# Patient Record
Sex: Female | Born: 1950 | Race: White | Hispanic: No | Marital: Married | Smoking: Never smoker
Health system: Southern US, Community
[De-identification: ages and names within clinical notes are randomized; demographics above are authoritative.]

## PROBLEM LIST (undated history)

## (undated) DIAGNOSIS — R296 Repeated falls: Secondary | ICD-10-CM

## (undated) DIAGNOSIS — G20A1 Parkinson's disease without dyskinesia, without mention of fluctuations: Secondary | ICD-10-CM

## (undated) DIAGNOSIS — E039 Hypothyroidism, unspecified: Secondary | ICD-10-CM

## (undated) DIAGNOSIS — M810 Age-related osteoporosis without current pathological fracture: Secondary | ICD-10-CM

## (undated) DIAGNOSIS — G20 Parkinson's disease: Secondary | ICD-10-CM

## (undated) HISTORY — PX: WRIST SURGERY: SHX841

## (undated) HISTORY — DX: Age-related osteoporosis without current pathological fracture: M81.0

## (undated) HISTORY — DX: Hypothyroidism, unspecified: E03.9

## (undated) HISTORY — DX: Repeated falls: R29.6

## (undated) HISTORY — DX: Parkinson's disease without dyskinesia, without mention of fluctuations: G20.A1

## (undated) HISTORY — PX: TONSILLECTOMY: SUR1361

## (undated) HISTORY — DX: Parkinson's disease: G20

---

## 1998-03-06 ENCOUNTER — Other Ambulatory Visit: Admission: RE | Admit: 1998-03-06 | Discharge: 1998-03-06 | Payer: Self-pay | Admitting: Obstetrics and Gynecology

## 1999-03-10 ENCOUNTER — Other Ambulatory Visit: Admission: RE | Admit: 1999-03-10 | Discharge: 1999-03-10 | Payer: Self-pay | Admitting: Obstetrics and Gynecology

## 1999-03-23 ENCOUNTER — Encounter (INDEPENDENT_AMBULATORY_CARE_PROVIDER_SITE_OTHER): Payer: Self-pay | Admitting: Specialist

## 1999-03-23 ENCOUNTER — Other Ambulatory Visit: Admission: RE | Admit: 1999-03-23 | Discharge: 1999-03-23 | Payer: Self-pay | Admitting: Obstetrics and Gynecology

## 2000-05-04 ENCOUNTER — Other Ambulatory Visit: Admission: RE | Admit: 2000-05-04 | Discharge: 2000-05-04 | Payer: Self-pay | Admitting: Obstetrics and Gynecology

## 2001-08-08 ENCOUNTER — Other Ambulatory Visit: Admission: RE | Admit: 2001-08-08 | Discharge: 2001-08-08 | Payer: Self-pay | Admitting: Obstetrics and Gynecology

## 2002-09-18 ENCOUNTER — Other Ambulatory Visit: Admission: RE | Admit: 2002-09-18 | Discharge: 2002-09-18 | Payer: Self-pay | Admitting: Obstetrics and Gynecology

## 2003-09-30 ENCOUNTER — Other Ambulatory Visit: Admission: RE | Admit: 2003-09-30 | Discharge: 2003-09-30 | Payer: Self-pay | Admitting: Obstetrics and Gynecology

## 2004-11-11 ENCOUNTER — Other Ambulatory Visit: Admission: RE | Admit: 2004-11-11 | Discharge: 2004-11-11 | Payer: Self-pay | Admitting: Obstetrics and Gynecology

## 2004-11-16 ENCOUNTER — Ambulatory Visit: Admission: RE | Admit: 2004-11-16 | Discharge: 2004-11-16 | Payer: Self-pay | Admitting: Obstetrics and Gynecology

## 2004-12-20 ENCOUNTER — Ambulatory Visit: Payer: Self-pay | Admitting: Cardiology

## 2004-12-24 ENCOUNTER — Ambulatory Visit: Payer: Self-pay | Admitting: Cardiology

## 2005-01-03 ENCOUNTER — Ambulatory Visit: Payer: Self-pay

## 2005-06-24 ENCOUNTER — Encounter: Admission: RE | Admit: 2005-06-24 | Discharge: 2005-06-24 | Payer: Self-pay | Admitting: Gastroenterology

## 2005-06-29 ENCOUNTER — Ambulatory Visit (HOSPITAL_COMMUNITY): Admission: RE | Admit: 2005-06-29 | Discharge: 2005-06-29 | Payer: Self-pay | Admitting: Gastroenterology

## 2009-07-14 ENCOUNTER — Encounter: Admission: RE | Admit: 2009-07-14 | Discharge: 2009-07-14 | Payer: Self-pay | Admitting: Internal Medicine

## 2011-10-20 DIAGNOSIS — G20 Parkinson's disease: Secondary | ICD-10-CM | POA: Insufficient documentation

## 2011-10-20 DIAGNOSIS — G20A1 Parkinson's disease without dyskinesia, without mention of fluctuations: Secondary | ICD-10-CM | POA: Insufficient documentation

## 2013-01-30 ENCOUNTER — Ambulatory Visit: Payer: BC Managed Care – PPO | Attending: Psychiatry | Admitting: Physical Therapy

## 2013-11-20 ENCOUNTER — Ambulatory Visit: Payer: Medicare Other | Attending: Psychiatry | Admitting: Physical Therapy

## 2013-11-20 DIAGNOSIS — G20A1 Parkinson's disease without dyskinesia, without mention of fluctuations: Secondary | ICD-10-CM | POA: Insufficient documentation

## 2013-11-20 DIAGNOSIS — R279 Unspecified lack of coordination: Secondary | ICD-10-CM | POA: Insufficient documentation

## 2013-11-20 DIAGNOSIS — IMO0001 Reserved for inherently not codable concepts without codable children: Secondary | ICD-10-CM | POA: Insufficient documentation

## 2013-11-20 DIAGNOSIS — G20 Parkinson's disease: Secondary | ICD-10-CM | POA: Insufficient documentation

## 2013-11-20 DIAGNOSIS — R269 Unspecified abnormalities of gait and mobility: Secondary | ICD-10-CM | POA: Insufficient documentation

## 2013-11-20 DIAGNOSIS — R259 Unspecified abnormal involuntary movements: Secondary | ICD-10-CM | POA: Diagnosis not present

## 2013-12-03 ENCOUNTER — Ambulatory Visit: Payer: Medicare Other | Attending: Psychiatry | Admitting: Physical Therapy

## 2013-12-03 DIAGNOSIS — R259 Unspecified abnormal involuntary movements: Secondary | ICD-10-CM | POA: Insufficient documentation

## 2013-12-03 DIAGNOSIS — R269 Unspecified abnormalities of gait and mobility: Secondary | ICD-10-CM | POA: Insufficient documentation

## 2013-12-03 DIAGNOSIS — G20 Parkinson's disease: Secondary | ICD-10-CM | POA: Diagnosis not present

## 2013-12-03 DIAGNOSIS — IMO0001 Reserved for inherently not codable concepts without codable children: Secondary | ICD-10-CM | POA: Insufficient documentation

## 2013-12-03 DIAGNOSIS — R279 Unspecified lack of coordination: Secondary | ICD-10-CM | POA: Diagnosis not present

## 2013-12-03 DIAGNOSIS — G20A1 Parkinson's disease without dyskinesia, without mention of fluctuations: Secondary | ICD-10-CM | POA: Insufficient documentation

## 2013-12-05 ENCOUNTER — Ambulatory Visit: Payer: Medicare Other | Admitting: Physical Therapy

## 2013-12-05 DIAGNOSIS — IMO0001 Reserved for inherently not codable concepts without codable children: Secondary | ICD-10-CM | POA: Diagnosis not present

## 2013-12-17 ENCOUNTER — Ambulatory Visit: Payer: Medicare Other | Admitting: Physical Therapy

## 2013-12-17 DIAGNOSIS — IMO0001 Reserved for inherently not codable concepts without codable children: Secondary | ICD-10-CM | POA: Diagnosis not present

## 2013-12-19 ENCOUNTER — Ambulatory Visit: Payer: Medicare Other | Admitting: Physical Therapy

## 2013-12-19 DIAGNOSIS — IMO0001 Reserved for inherently not codable concepts without codable children: Secondary | ICD-10-CM | POA: Diagnosis not present

## 2013-12-24 ENCOUNTER — Ambulatory Visit: Payer: Medicare Other | Admitting: Physical Therapy

## 2013-12-24 DIAGNOSIS — IMO0001 Reserved for inherently not codable concepts without codable children: Secondary | ICD-10-CM | POA: Diagnosis not present

## 2013-12-26 ENCOUNTER — Ambulatory Visit: Payer: Medicare Other | Admitting: Physical Therapy

## 2013-12-26 DIAGNOSIS — IMO0001 Reserved for inherently not codable concepts without codable children: Secondary | ICD-10-CM | POA: Diagnosis not present

## 2013-12-31 ENCOUNTER — Ambulatory Visit: Payer: BC Managed Care – PPO | Admitting: Physical Therapy

## 2014-01-02 ENCOUNTER — Ambulatory Visit: Payer: BC Managed Care – PPO | Admitting: Physical Therapy

## 2014-01-14 ENCOUNTER — Ambulatory Visit: Payer: Medicare Other | Attending: Psychiatry | Admitting: Physical Therapy

## 2014-01-14 DIAGNOSIS — R259 Unspecified abnormal involuntary movements: Secondary | ICD-10-CM | POA: Insufficient documentation

## 2014-01-14 DIAGNOSIS — G20A1 Parkinson's disease without dyskinesia, without mention of fluctuations: Secondary | ICD-10-CM | POA: Insufficient documentation

## 2014-01-14 DIAGNOSIS — R279 Unspecified lack of coordination: Secondary | ICD-10-CM | POA: Insufficient documentation

## 2014-01-14 DIAGNOSIS — R269 Unspecified abnormalities of gait and mobility: Secondary | ICD-10-CM | POA: Diagnosis not present

## 2014-01-14 DIAGNOSIS — G20 Parkinson's disease: Secondary | ICD-10-CM | POA: Insufficient documentation

## 2014-01-14 DIAGNOSIS — IMO0001 Reserved for inherently not codable concepts without codable children: Secondary | ICD-10-CM | POA: Diagnosis present

## 2014-01-16 ENCOUNTER — Ambulatory Visit: Payer: Medicare Other | Admitting: Physical Therapy

## 2014-01-16 DIAGNOSIS — IMO0001 Reserved for inherently not codable concepts without codable children: Secondary | ICD-10-CM | POA: Diagnosis not present

## 2014-01-21 ENCOUNTER — Ambulatory Visit: Payer: Medicare Other | Admitting: Physical Therapy

## 2014-01-21 DIAGNOSIS — IMO0001 Reserved for inherently not codable concepts without codable children: Secondary | ICD-10-CM | POA: Diagnosis not present

## 2014-01-23 ENCOUNTER — Ambulatory Visit: Payer: Medicare Other | Admitting: Physical Therapy

## 2014-01-23 DIAGNOSIS — IMO0001 Reserved for inherently not codable concepts without codable children: Secondary | ICD-10-CM | POA: Diagnosis not present

## 2014-07-10 ENCOUNTER — Ambulatory Visit: Payer: BC Managed Care – PPO | Attending: Physical Therapy | Admitting: Physical Therapy

## 2016-10-12 ENCOUNTER — Ambulatory Visit: Payer: Self-pay | Admitting: Podiatry

## 2016-10-20 ENCOUNTER — Ambulatory Visit (INDEPENDENT_AMBULATORY_CARE_PROVIDER_SITE_OTHER): Payer: Medicare Other | Admitting: Podiatry

## 2016-10-20 ENCOUNTER — Encounter: Payer: Self-pay | Admitting: Podiatry

## 2016-10-20 ENCOUNTER — Ambulatory Visit (INDEPENDENT_AMBULATORY_CARE_PROVIDER_SITE_OTHER): Payer: Medicare Other

## 2016-10-20 ENCOUNTER — Ambulatory Visit: Payer: Medicare Other

## 2016-10-20 VITALS — BP 96/62 | HR 65 | Resp 16 | Ht 66.5 in | Wt 135.0 lb

## 2016-10-20 DIAGNOSIS — M25572 Pain in left ankle and joints of left foot: Secondary | ICD-10-CM

## 2016-10-20 DIAGNOSIS — L84 Corns and callosities: Secondary | ICD-10-CM

## 2016-10-20 DIAGNOSIS — M6740 Ganglion, unspecified site: Secondary | ICD-10-CM | POA: Diagnosis not present

## 2016-10-20 DIAGNOSIS — M2041 Other hammer toe(s) (acquired), right foot: Secondary | ICD-10-CM

## 2016-10-20 DIAGNOSIS — B079 Viral wart, unspecified: Secondary | ICD-10-CM

## 2016-10-20 NOTE — Progress Notes (Signed)
Subjective:    Patient ID: Kelly Howard, female   DOB: 66 y.o.   MRN: [id number]   HPI patient presents stating that she has numerous problems with one being cyst on the left ankle number to be a lesion plantar aspect second digit left that's been very painful and 3 lesion between the hallux and second toe right that's painful and has been more recent. Has had the ankle drained in the past    Review of Systems  All other systems reviewed and are negative.       Objective:  Physical Exam  Cardiovascular: Intact distal pulses.   Musculoskeletal: Normal range of motion.  Neurological: She is alert.  Skin: Skin is warm.  Nursing note and vitals reviewed.  neurovascular status intact muscle strength adequate range of motion within normal limits with patient noted to have a mass on the left ankle that is movable within subcutaneous tissue a lesion plantar aspect left second digit which is painful when pressed and a keratotic lesion between the hallux second toe right with deviation the hallux against the digit. 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 I have recommended that we focused on the left foot and I did do a proximal nerve block of the left ankle and then the second digit left and I first drain the left mass getting out approximate 5 mL of gelatinous material and injected some steroid to try to reduce inflammation and instructed on compression. For the left second toe I went ahead and anesthetized the digit and then I removed the mass in toto and sent for pathological evaluation and applied sterile dressing and debrided lesion right and padded. Patient will be seen back to recheck  X-ray report indicates right that there is  deviation the hallux against the second toe

## 2018-04-06 ENCOUNTER — Ambulatory Visit: Payer: Medicare Other | Attending: Nurse Practitioner | Admitting: Physical Therapy

## 2018-04-06 DIAGNOSIS — R2689 Other abnormalities of gait and mobility: Secondary | ICD-10-CM | POA: Diagnosis present

## 2018-04-06 DIAGNOSIS — R2681 Unsteadiness on feet: Secondary | ICD-10-CM | POA: Diagnosis present

## 2018-04-06 DIAGNOSIS — R293 Abnormal posture: Secondary | ICD-10-CM | POA: Diagnosis present

## 2018-04-09 ENCOUNTER — Encounter: Payer: Self-pay | Admitting: Physical Therapy

## 2018-04-09 NOTE — Therapy (Signed)
[facility] [address] [place], [place], [address] Phone: [phone]   Fax:  [phone]  Physical Therapy Evaluation  Patient Details  Name: Kelly Howard MRN: [id number] Date of Birth: [birth year] Referring Provider (PT): Alcide Clever, NP   Encounter Date: 04/06/2018  PT End of Session - 04/09/18 4010    Visit Number  1    Number of Visits  9    Date for PT Re-Evaluation  07/07/18    Authorization Type  [employer]HC Medicare (?per facesheet)-10th visit progress note    PT Start Time  1150    PT Stop Time  1240    PT Time Calculation (min)  50 min    Equipment Utilized During Treatment  Gait belt    Activity Tolerance  Patient tolerated treatment well    Behavior During Therapy  --   tearful at times      Past Medical History:  Diagnosis Date  . Parkinson's disease (HCC)     History reviewed. No pertinent surgical history.  There were no vitals filed for this visit.   Subjective Assessment - 04/09/18 0915    Subjective  I just don't feel like myself-feel a twitching in my lips rcently, my speech feels different, and have had hallucinations with medications (>1 year ago).  Feel worse in the past 2 weeks; the only new change is Zoloft for 2 days.  Starting to walk backwards again-like I used to, no falls.    Patient Stated Goals  To get evaluated each year for PD.    Currently in Pain?  No/denies         [facility] PT Assessment - 04/09/18 2725      Assessment   Medical Diagnosis  Parkinson's disease    Referring Provider (PT)  Clarisse Delmer Islam, NP    Onset Date/Surgical Date  03/21/18      Precautions   Precautions  Fall      Home Environment   Living Environment  Private residence    Living Arrangements  Spouse/significant other    Available Help at Discharge  Family    Type of Home  [facility] Layout  Two level      Prior Function   Level of Independence  Independent    Leisure   Trainer 2x/wk, was doing Silver Sneakers/bike, but recently stopped.      Observation/Other Assessments   Focus on Therapeutic Outcomes (FOTO)   NA      Posture/Postural Control   Posture/Postural Control  Postural limitations    Postural Limitations  Forward head;Weight shift right   in sitting; in standing-shoulders posterior to hips     ROM / Strength   AROM / PROM / Strength  Strength      Strength   Overall Strength  Within functional limits for tasks performed   by MMT     Transfers   Transfers  Sit to Stand;Stand to Sit    Sit to Stand  5: Supervision;Without upper extremity assist;From chair/3-in-1   decreased anterior weightshift   Five time sit to stand comments   8.44    Stand to Sit  5: Supervision;Without upper extremity assist;To chair/3-in-1;Uncontrolled descent    Comments  Reports difficulty getting up from sofa at home      Ambulation/Gait   Ambulation/Gait  Yes    Ambulation/Gait Assistance  6: Modified independent (Device/Increase time)    Ambulation Distance (Feet)  200 Feet  Assistive device  None    Gait Pattern  Step-through pattern;Decreased trunk rotation;Narrow base of support   Trunk posterior during gait   Ambulation Surface  Level;Indoor    Gait velocity  9.97 sec = 3.38ft/sec      Standardized Balance Assessment   Standardized Balance Assessment  Timed Up and Go Test      Timed Up and Go Test   Normal TUG (seconds)  9.06    Manual TUG (seconds)  9.91    Cognitive TUG (seconds)  11.47    TUG Comments  Scores >13.5-15 seconds indicate increased fall risk.  Scores >10% difference in TUG and TUG cognitive indicate difficulty with dual tasking.      Functional Gait  Assessment   Gait assessed   Yes    Gait Level Surface  Walks 20 ft in less than 5.5 sec, no assistive devices, good speed, no evidence for imbalance, normal gait pattern, deviates no more than 6 in outside of the 12 in walkway width.    Change in Gait Speed  Able to smoothly  change walking speed without loss of balance or gait deviation. Deviate no more than 6 in outside of the 12 in walkway width.    Gait with Horizontal Head Turns  Performs head turns smoothly with slight change in gait velocity (eg, minor disruption to smooth gait path), deviates 6-10 in outside 12 in walkway width, or uses an assistive device.    Gait with Vertical Head Turns  Performs task with slight change in gait velocity (eg, minor disruption to smooth gait path), deviates 6 - 10 in outside 12 in walkway width or uses assistive device    Gait and Pivot Turn  Pivot turns safely in greater than 3 sec and stops with no loss of balance, or pivot turns safely within 3 sec and stops with mild imbalance, requires small steps to catch balance.    Step Over Obstacle  Is able to step over one shoe box (4.5 in total height) without changing gait speed. No evidence of imbalance.    Gait with Narrow Base of Support  Is able to ambulate for 10 steps heel to toe with no staggering.    Gait with Eyes Closed  Walks 20 ft, slow speed, abnormal gait pattern, evidence for imbalance, deviates 10-15 in outside 12 in walkway width. Requires more than 9 sec to ambulate 20 ft.   1.6   Ambulating Backwards  Walks 20 ft, uses assistive device, slower speed, mild gait deviations, deviates 6-10 in outside 12 in walkway width.   12.19   Steps  Alternating feet, no rail.    Total Score  23    FGA comment:  Scores <22/30 indicate increased fall risk.                Objective measurements completed on examination: See above findings.              PT Education - 04/09/18 0921    Education Details  Answered pt's questions regarding symptom changes:  Recommended any time there are changes in PD symptoms, please talk with neurologist; discussed stressors and how they can worsen PD symptoms.    Person(s) Educated  Patient    Methods  Explanation    Comprehension  Verbalized understanding       Checked  BP, and BP WNL (116/68)   PT Long Term Goals - 04/09/18 0930      PT LONG TERM GOAL #1  Title  Pt will be independent with HEP for Parkinson's specific exercises, to address posture, balance, gait.  TARGET 05/04/18 (may be delayed due to delayed treatment start)    Time  4    Period  Weeks    Status  New    Target Date  05/04/18      PT LONG TERM GOAL #2   Title  Pt will perform at least 8 of 10 reps of sit<>stand without UE support from 18" and below with no posterior lean/loss of balance for improved efficiency and safety with transfers.    Time  4    Period  Weeks    Status  New    Target Date  05/04/18      PT LONG TERM GOAL #3   Title  Pt will perform TUG and TUG cogntivie with less than 10% difference in score, for improved dual tasking with gait.    Time  4    Period  Weeks    Status  New    Target Date  05/04/18      PT LONG TERM GOAL #4   Title  Pt will verbalize plans for ongoing community fitness upon d/c from PT.    Time  4    Period  Weeks    Status  New    Target Date  05/04/18      PT LONG TERM GOAL #5   Title  Pt will verbalize understanding of fall prevention in home environment.    Time  4    Period  Weeks    Status  New    Target Date  05/04/18             Plan - 04/09/18 1610    Clinical Impression Statement  Pt is a 67 year old female with history of Parkinson's disease who presents to OPPT "for her yearly PD eval."  (Pt has not had PT specifically for PD at this clinic in at least 4 years per [facility]).  She presents to PT with abnormal posture, decreased timing and coordination of gait, posterior lean/uncontrolled sit at times.  Though she is not specifically at fall risk per mobility measures, she would benefit from skilled PT based on the deficits previously stated to improve balance, gait and functional mobility, quality of large amplitude movement patterns to decrease fall risk.    History and Personal Factors relevant to plan of care:  Hx of  PD, emerging/changing symptoms    Clinical Presentation  Stable    Clinical Presentation due to:  PD as neurodegenerative disease    Clinical Decision Making  Low    Rehab Potential  Good    PT Frequency  2x / week    PT Duration  4 weeks   plus eval   PT Treatment/Interventions  ADLs/Self Care Home Management;Balance training;Therapeutic exercise;Therapeutic activities;Functional mobility training;Gait training;Neuromuscular re-education;Patient/family education    PT Next Visit Plan  Initiate PWR! Moves for posture, trunk flexibility, weightshifting and stepping (standing, supine positions); transfer training, postural strategy training    Recommended Other Services  Possible speech therapy based on pt's new speech concerns    Consulted and Agree with Plan of Care  Patient       Patient will benefit from skilled therapeutic intervention in order to improve the following deficits and impairments:  Abnormal gait, Decreased mobility, Difficulty walking, Decreased strength, Postural dysfunction  Visit Diagnosis: Other abnormalities of gait and mobility  Abnormal posture  Unsteadiness on feet  Problem List There are no active problems to display for this patient.   , W. 04/09/2018, 9:34 AM  Gean Maidens., PT   [facility] [address] [place], [place], [address] Phone: [phone]   Fax:  [phone]  Name: EMMAJEAN RATLEDGE MRN: [id number] Date of Birth: 1950/11/07

## 2018-04-20 ENCOUNTER — Encounter: Payer: Self-pay | Admitting: Physical Therapy

## 2018-04-20 ENCOUNTER — Ambulatory Visit: Payer: Medicare Other | Admitting: Physical Therapy

## 2018-04-20 DIAGNOSIS — R2681 Unsteadiness on feet: Secondary | ICD-10-CM

## 2018-04-20 DIAGNOSIS — R2689 Other abnormalities of gait and mobility: Secondary | ICD-10-CM

## 2018-04-20 DIAGNOSIS — R293 Abnormal posture: Secondary | ICD-10-CM

## 2018-04-20 NOTE — Therapy (Signed)
[facility] [address] [place], [place], [address] Phone: [phone]   Fax:  [phone]  Physical Therapy Treatment  Patient Details  Name: Kelly Howard MRN: [id number] Date of Birth: 12/08/1950 Referring Provider (PT): Alcide Cleverlarisse Justine Goas, [place]   Encounter Date: 04/20/2018  PT End of Session - 04/20/18 1858    Visit Number  2    Number of Visits  9    Date for PT Re-Evaluation  07/07/18    Authorization Type  [employer]HC Medicare-10th visit progress note    PT Start Time  0935    PT Stop Time  1013    PT Time Calculation (min)  38 min    Activity Tolerance  Patient tolerated treatment well    Behavior During Therapy  Anxious       Past Medical History:  Diagnosis Date  . Parkinson's disease (HCC)     History reviewed. No pertinent surgical history.  There were no vitals filed for this visit.  Subjective Assessment - 04/20/18 0937    Subjective  Feel like I'm having to take my pills sooner, and I just feel stiff.  Spoke with doctor back and forth.  Will need to talk with her again.  Pt asks multiple times during session:  does this mean I'm getting worse?      Patient Stated Goals  To get evaluated each year for PD.    Currently in Pain?  No/denies                       [facility] Adult PT Treatment/Exercise - 04/20/18 0001      Exercises   Exercises  Knee/Hip      Knee/Hip Exercises: Aerobic   Stepper  Seated Sci[employer] stepper, Level 2, 4 extremities x 6 minutes, with cues for increased intensity, varied RPMs, 80-115.  Discussed pt rating work effort level and trying to work at 7-8/10 work effort level when she does recumbent bike on her own.        [facility]) - 04/20/18 [id number]    PWR! exercises  Moves in supine;Moves in standing    PWR! Up  x 10 reps     PWR! Rock  x 5 reps each side    PWR! Twist  x 5 reps each side    PWR! Step  x 5 reps each side    Comments  PWR! Moves in supine with  verbal and tactile cues for technique:  slowed pace initially, then increased pace, increased intensity    PWR! Up  x 10 reps     PWR! Rock  x 10 reps each side    PWR! Twist  x 10 reps each side    PWR Step  x 10 reps each side    Comments  PWR! Moves in standing, with initial reps performed slowly for stretch, then increased intensity to offset rigidity.    PWR! Sit to Stand  x 5 reps, 2 sets, cues for foot position, for increased forward lean for slowed pace to sit to avoid posterior lean and uncontrolled descent     Explained rationale for each exercise, reasoning for slowed performance at first to counteract bradykinesia, then more intense, larger amplitude pace for rigidity.     PT Education - 04/20/18 1857    Education Details  PWR! Moves standing, PWR! Moves supine, work effort level for [employer]ated Stepper; encouraged pt to journal/document PD symptoms especially in relation to off-times  and needing to take meds again in order to objectify symptoms to MD    Person(s) Educated  Patient    Methods  Explanation;Demonstration;Handout    Comprehension  Verbalized understanding;Returned demonstration;Verbal cues required          PT Long Term Goals - 04/09/18 0930      PT LONG TERM GOAL #1   Title  Pt will be independent with HEP for Parkinson's specific exercises, to address posture, balance, gait.  TARGET 05/04/18 (may be delayed due to delayed treatment start)    Time  4    Period  Weeks    Status  New    Target Date  05/04/18      PT LONG TERM GOAL #2   Title  Pt will perform at least 8 of 10 reps of sit<>stand without UE support from 18" and below with no posterior lean/loss of balance for improved efficiency and safety with transfers.    Time  4    Period  Weeks    Status  New    Target Date  05/04/18      PT LONG TERM GOAL #3   Title  Pt will perform TUG and TUG cogntivie with less than 10% difference in score, for improved dual tasking with gait.    Time  4    Period   Weeks    Status  New    Target Date  05/04/18      PT LONG TERM GOAL #4   Title  Pt will verbalize plans for ongoing community fitness upon d/c from PT.    Time  4    Period  Weeks    Status  New    Target Date  05/04/18      PT LONG TERM GOAL #5   Title  Pt will verbalize understanding of fall prevention in home environment.    Time  4    Period  Weeks    Status  New    Target Date  05/04/18            Plan - 04/20/18 1859    Clinical Impression Statement  Pt returns today for first visit since eval, and continues to report concern over symptoms, off-times, feeling the need to take PD medications earlier that MD-recommended time.  PT recommends she follow up with MD regarding medication/symptom questions.  With PWR! Moves in standing and supine today, pt performs slowly at first to work through bradykinesia, then more intensely for remaining reps, to offset rigidity.  Pt reports feeling better after performing exercises.    Rehab Potential  Good    PT Frequency  2x / week    PT Duration  4 weeks   plus eval   PT Treatment/Interventions  ADLs/Self Care Home Management;Balance training;Therapeutic exercise;Therapeutic activities;Functional mobility training;Gait training;Neuromuscular re-education;Patient/family education    PT Next Visit Plan  Review supine and standing PWR! Moves; maybe try quadruped PWR! Moves, weightshifting, stepping activities; transfer training and intiation of gait    Consulted and Agree with Plan of Care  Patient       Patient will benefit from skilled therapeutic intervention in order to improve the following deficits and impairments:  Abnormal gait, Decreased mobility, Difficulty walking, Decreased strength, Postural dysfunction  Visit Diagnosis: Abnormal posture  Unsteadiness on feet  Other abnormalities of gait and mobility     Problem List There are no active problems to display for this patient.   [name] W. 04/20/2018, 7:03 PM  Gean Maidens PT   [facility] [address] [place], [place], [address] Phone: [phone]   Fax:  [phone]  Name: Kelly Howard MRN: [id number] Date of Birth: [birth year]

## 2018-04-24 ENCOUNTER — Ambulatory Visit: Payer: Medicare Other | Admitting: Physical Therapy

## 2018-04-24 ENCOUNTER — Encounter: Payer: Self-pay | Admitting: Physical Therapy

## 2018-04-24 DIAGNOSIS — R2689 Other abnormalities of gait and mobility: Secondary | ICD-10-CM | POA: Diagnosis not present

## 2018-04-24 DIAGNOSIS — R2681 Unsteadiness on feet: Secondary | ICD-10-CM

## 2018-04-24 DIAGNOSIS — R293 Abnormal posture: Secondary | ICD-10-CM

## 2018-04-24 NOTE — Therapy (Signed)
[facility] [address] [place], [place], [address] Phone: [phone]   Fax:  [phone]  Physical Therapy Treatment  Patient Details  Name: Kelly Howard MRN: [id number] Date of Birth: February 23, 1951 Referring Provider (PT): Alcide Clever, NP   Encounter Date: 04/24/2018  PT End of Session - 04/24/18 1637    Visit Number  3    Number of Visits  9    Date for PT Re-Evaluation  07/07/18    Authorization Type  [employer]HC Medicare-10th visit progress note    PT Start Time  0804    PT Stop Time  0847    PT Time Calculation (min)  43 min    Activity Tolerance  Patient tolerated treatment well    Behavior During Therapy  [facility] for tasks assessed/performed       Past Medical History:  Diagnosis Date  . Parkinson's disease (HCC)     History reviewed. No pertinent surgical history.  There were no vitals filed for this visit.  Subjective Assessment - 04/24/18 0807    Subjective  Feel like I'm moving better this morning.  Just took my pills.  Had a hard time with the bed exercises.  Showed PT a journal of medication time that she plans to show to/talk with MD.    Patient Stated Goals  To get evaluated each year for PD.    Currently in Pain?  No/denies                       [facility] Adult PT Treatment/Exercise - 04/24/18 0001      Therapeutic Activites    Therapeutic Activities  Other Therapeutic Activities    Other Therapeutic Activities  Bed Mobility:  pt reports difficulty with getting in/out of bed and with bed mobility.  Practiced sit<>supine through sidelying position (which pt has been going through long sit to supine), with cues for intensity and use of UEs to push up to sit EOB.  Cues for full foot contact on floor prior to standing.        [facility]) - 04/24/18 6962    PWR! exercises  Moves in supine    PWR! Up  x 10 reps through shoulders, x 10 reps through hips-cues for full hand contact,  full foot contact on mat    PWR! Rock  x 10 reps each side    PWR! Twist  x 10 reps each side    PWR! Step  x 10 reps each side, started step-step out/in, then added scooting.    Comments  PWR! Moves in supine, pt needs review of these exercises, with verbal and tactile cues for technique, verbal cues for increased intensity of movement pattern.  Reiterated connection of each exercise to bed mobility components.    PWR! Rock  x 5 reps    PWR Step  x 5 reps    Comments  Review of these motions, with cues for coordinated arms in PWR! Standing moves    PWR! Sit to Stand  x 10 reps from mat surface, then x 10 reps from 16" chair, then x 5 reps from therapy ball.  Cues for technique.  Practiced lateral scooting forward/back on mat, simulating couch to scoot to edge.          PT Education - 04/24/18 1636    Education Details  Bed mobility techniques    Person(s) Educated  Patient    Methods  Explanation;Demonstration  Comprehension  Verbalized understanding;Returned demonstration          PT Long Term Goals - 04/09/18 0930      PT LONG TERM GOAL #1   Title  Pt will be independent with HEP for Parkinson's specific exercises, to address posture, balance, gait.  TARGET 05/04/18 (may be delayed due to delayed treatment start)    Time  4    Period  Weeks    Status  New    Target Date  05/04/18      PT LONG TERM GOAL #2   Title  Pt will perform at least 8 of 10 reps of sit<>stand without UE support from 18" and below with no posterior lean/loss of balance for improved efficiency and safety with transfers.    Time  4    Period  Weeks    Status  New    Target Date  05/04/18      PT LONG TERM GOAL #3   Title  Pt will perform TUG and TUG cogntivie with less than 10% difference in score, for improved dual tasking with gait.    Time  4    Period  Weeks    Status  New    Target Date  05/04/18      PT LONG TERM GOAL #4   Title  Pt will verbalize plans for ongoing community fitness upon  d/c from PT.    Time  4    Period  Weeks    Status  New    Target Date  05/04/18      PT LONG TERM GOAL #5   Title  Pt will verbalize understanding of fall prevention in home environment.    Time  4    Period  Weeks    Status  New    Target Date  05/04/18            Plan - 04/24/18 1637    Clinical Impression Statement  Focused skilled PT session today mainly on review of/performance of supine PWR! Moves and how they relate to function (improved bed mobility, decreased stiffness, decreased bradykinesia), with cues on technique and increased intensity of movement patterns.  Also addressed bed mobility techniques and transfer training.  Pt benefits from cues and appears to demonstrate improved mobility with transfers and bed mobility with practice and cues.    Rehab Potential  Good    PT Frequency  2x / week    PT Duration  4 weeks   plus eval   PT Treatment/Interventions  ADLs/Self Care Home Management;Balance training;Therapeutic exercise;Therapeutic activities;Functional mobility training;Gait training;Neuromuscular re-education;Patient/family education    PT Next Visit Plan  Review bed mobility; maybe try quadruped PWR! Moves, weightshifting, stepping activities, transfers, initiation of gait    Consulted and Agree with Plan of Care  Patient       Patient will benefit from skilled therapeutic intervention in order to improve the following deficits and impairments:  Abnormal gait, Decreased mobility, Difficulty walking, Decreased strength, Postural dysfunction  Visit Diagnosis: Abnormal posture  Unsteadiness on feet     Problem List There are no active problems to display for this patient.   [name] 04/24/2018, 4:41 PM  [name],[name], PT   [facility] [address] [place], [place], [address] Phone: [phone]   Fax:  [phone]  Name: Kelly Howard MRN: [id number] Date of Birth:  05/10/1951

## 2018-05-07 ENCOUNTER — Ambulatory Visit: Payer: Medicare Other | Attending: Nurse Practitioner | Admitting: Physical Therapy

## 2018-05-07 DIAGNOSIS — R2689 Other abnormalities of gait and mobility: Secondary | ICD-10-CM | POA: Insufficient documentation

## 2018-05-07 DIAGNOSIS — R293 Abnormal posture: Secondary | ICD-10-CM | POA: Diagnosis present

## 2018-05-07 DIAGNOSIS — R2681 Unsteadiness on feet: Secondary | ICD-10-CM | POA: Diagnosis present

## 2018-05-07 NOTE — Therapy (Signed)
[facility] [address] [place], [place], [address] Phone: [phone]   Fax:  [phone]  Physical Therapy Treatment  Patient Details  Name: Kelly Howard MRN: [id number] Date of Birth: 1950/09/25 Referring Provider (PT): Alcide Clever, NP   Encounter Date: 05/07/2018  PT End of Session - 05/07/18 1504    Visit Number  4    Number of Visits  9    Date for PT Re-Evaluation  07/07/18    Authorization Type  [employer] Medicare-10th visit progress note    PT Start Time  0933    PT Stop Time  1013    PT Time Calculation (min)  40 min    Activity Tolerance  Patient tolerated treatment well    Behavior During Therapy  [facility] for tasks assessed/performed       Past Medical History:  Diagnosis Date  . Parkinson's disease (HCC)     No past surgical history on file.  There were no vitals filed for this visit.  Subjective Assessment - 05/07/18 0935    Subjective  Called the doctor-I think I'm wearing off meds.  Sometimes I can get up better than others.    Patient Stated Goals  To get evaluated each year for PD.    Currently in Pain?  No/denies                       [facility] Adult PT Treatment/Exercise - 05/07/18 0938      Transfers   Transfers  Sit to Stand;Stand to Sit    Sit to Stand  6: Modified independent (Device/Increase time);Without upper extremity assist;From chair/3-in-1;From bed    Stand to Sit  6: Modified independent (Device/Increase time);Without upper extremity assist;To chair/3-in-1    Number of Reps  10 reps;Other sets (comment)   from 20" mat, from 18" chair, 16" chair   Transfer Cueing  Cues to use UEs for momentum boost forward, then to upright standing posture (pt tends to hold hands outstretched in front, needs cues for upright posture)    Comments  Also practiced from seated on blue therapy ball, then seated on green therapy ball x 5 reps each for sit<>stand from soft surfaces.   Practiced scooting-varied methods including lateral scooting, "walking" legs forward towards edge of mat, and forward lean to lift buttocks off mat, then bring buttocks forward towards edge of chair.       Floor to stand transfer:  Practiced quadruped>1/2 kneel to stand with light UE support x 2 reps, without UE support x 4 reps-cues each rep for wide BOS foot placement upon standing.  Practiced from sitting>side sitting>1/2 kneel (utilizing rocking motion to initiate movement) to stand without UE support with cues for wide BOS in standing.   [facility]) - 05/07/18 1007    PWR! exercises  Moves in [place]! Up  x 10    PWR! Rock  x 10 reps forward and back     PWR! Twist  x 10 reps each side    PWR! Step  x 10 reps with UE support each side, then 5 reps coming up to upright posture with stepping, each side    Comments  PWR! Moves in quadruped position with cues for technique, posture, stability, for improved floor mobility and ability to get up from the floor      Related each PWR! Moves position to function with floor to stand mobility.  PT Education - 05/07/18 1503    Education Details  PWR! Moves in quadruped added to HEP    Person(s) Educated  Patient    Methods  Explanation;Demonstration;Handout    Comprehension  Verbalized understanding;Returned demonstration;Verbal cues required          PT Long Term Goals - 04/09/18 0930      PT LONG TERM GOAL #1   Title  Pt will be independent with HEP for Parkinson's specific exercises, to address posture, balance, gait.  TARGET 05/04/18 (may be delayed due to delayed treatment start)    Time  4    Period  Weeks    Status  New    Target Date  05/04/18      PT LONG TERM GOAL #2   Title  Pt will perform at least 8 of 10 reps of sit<>stand without UE support from 18" and below with no posterior lean/loss of balance for improved efficiency and safety with transfers.    Time  4    Period  Weeks    Status  New    Target Date   05/04/18      PT LONG TERM GOAL #3   Title  Pt will perform TUG and TUG cogntivie with less than 10% difference in score, for improved dual tasking with gait.    Time  4    Period  Weeks    Status  New    Target Date  05/04/18      PT LONG TERM GOAL #4   Title  Pt will verbalize plans for ongoing community fitness upon d/c from PT.    Time  4    Period  Weeks    Status  New    Target Date  05/04/18      PT LONG TERM GOAL #5   Title  Pt will verbalize understanding of fall prevention in home environment.    Time  4    Period  Weeks    Status  New    Target Date  05/04/18            Plan - 05/07/18 1505    Clinical Impression Statement  Skilled PT session focused on transfer training, as pt reports she is continuing to have difficulty with transfers from low surfaces at home as well as more difficulty with floor to stand than in the past.  Focused on sit<>stand from lower surfaces and floor to stand, with pt needing repeated cues for foot placement for optimal stability upon standing.  Pt will continue to benefit from skilled PT to address mobility, transfers and gait.    Rehab Potential  Good    PT Frequency  2x / week    PT Duration  4 weeks   plus eval   PT Treatment/Interventions  ADLs/Self Care Home Management;Balance training;Therapeutic exercise;Therapeutic activities;Functional mobility training;Gait training;Neuromuscular re-education;Patient/family education    PT Next Visit Plan  Review transfers, floor to stand transfers, review quadruped PWR! Moves, stepping activities and weightshifting, balance in posterior direction    Consulted and Agree with Plan of Care  Patient       Patient will benefit from skilled therapeutic intervention in order to improve the following deficits and impairments:  Abnormal gait, Decreased mobility, Difficulty walking, Decreased strength, Postural dysfunction  Visit Diagnosis: Abnormal posture  Unsteadiness on feet     Problem  List There are no active problems to display for this patient.   , W. 05/07/2018, 3:11 PM  ,  Lacretia Nicks PT   [facility] [address] [place], [place], [address] Phone: [phone]   Fax:  [phone]  Name: RAELLE CHAMBERS MRN: [id number] Date of Birth: 1950/06/22

## 2018-05-09 ENCOUNTER — Ambulatory Visit: Payer: Medicare Other | Admitting: Physical Therapy

## 2018-05-09 ENCOUNTER — Encounter: Payer: Self-pay | Admitting: Physical Therapy

## 2018-05-09 DIAGNOSIS — R293 Abnormal posture: Secondary | ICD-10-CM

## 2018-05-09 DIAGNOSIS — R2681 Unsteadiness on feet: Secondary | ICD-10-CM

## 2018-05-09 DIAGNOSIS — R2689 Other abnormalities of gait and mobility: Secondary | ICD-10-CM

## 2018-05-10 NOTE — Therapy (Signed)
[facility] [address] [place], [place], [address] Phone: [phone]   Fax:  [phone]  Physical Therapy Treatment  Patient Details  Name: Kelly Howard MRN: [id number] Date of Birth: 02/10/1951 Referring Provider (PT): Alcide Cleverlarisse Justine Goas, NP   Encounter Date: 05/09/2018  PT End of Session - 05/10/18 1646    Visit Number  5    Number of Visits  9    Date for PT Re-Evaluation  07/07/18    Authorization Type  [employer] Medicare-10th visit progress note    PT Start Time  0934    PT Stop Time  1014    PT Time Calculation (min)  40 min    Activity Tolerance  Patient tolerated treatment well    Behavior During Therapy  [facility] for tasks assessed/performed       Past Medical History:  Diagnosis Date  . Parkinson's disease (HCC)     History reviewed. No pertinent surgical history.  There were no vitals filed for this visit.  Subjective Assessment - 05/09/18 0935    Subjective  Didn't have a chance to formally do the exercises, but I have been thinking about the way I get up with transfers.    Patient Stated Goals  To get evaluated each year for PD.    Currently in Pain?  No/denies                       [facility] Adult [facility] Treatment/Exercise - 05/09/18 0938      Transfers   Transfers  Sit to Stand;Stand to Sit;Floor to Transfer    Sit to Stand  6: Modified independent (Device/Increase time);Without upper extremity assist;From chair/3-in-1;From bed    Stand to Sit  6: Modified independent (Device/Increase time);Without upper extremity assist;To chair/3-in-1    Floor to Transfer  5: Supervision;Without upper extremity assist    Floor to Transfer Details (indicate cue type and reason)  Floor to stand transfer, coming from long side sit position, using rocking technique into quadruped position, then to 1/2 kneel, then push up through knees to stand.  Cues for wide BOS upon standing.    Number of Reps  10  reps;2 sets   from chair, bed   Transfer Cueing  improved forward weightshift to initiate transfers; needs occasional cues for foot positioning during transfers during PT session transitioning from various activities      High Level Balance   High Level Balance Activities  Backward walking   along counter, 3 reps, cues for smooth transition fwd/back   High Level Balance Comments  Heel/toe raisesx 15 reps, then stagger stance forward/back rocking x 15 reps, then step back and weightshift x 15 reps, then forward<>back step and weigthshift x 10 reps, for balance recovery work in posterior direction        [facility] - 05/10/18 1641    PWR! exercises  Moves in quadraped   Review of HEP from last visit   PWR! Up  x 10 reps    PWR! Rock  x 10 reps forward/back rocking    PWR! Twist  x 10 reps each side    PWR! Step  x 10 reps step each side with open arms/upright posture    Comments  Review of HEP from last visit, with pt return demo understanding with min cues               PT Long Term Goals - 04/09/18 0930  PT LONG TERM GOAL #1   Title  Pt will be independent with HEP for Parkinson's specific exercises, to address posture, balance, gait.  TARGET 05/04/18 (may be delayed due to delayed treatment start)    Time  4    Period  Weeks    Status  New    Target Date  05/04/18      PT LONG TERM GOAL #2   Title  Pt will perform at least 8 of 10 reps of sit<>stand without UE support from 18" and below with no posterior lean/loss of balance for improved efficiency and safety with transfers.    Time  4    Period  Weeks    Status  New    Target Date  05/04/18      PT LONG TERM GOAL #3   Title  Pt will perform TUG and TUG cogntivie with less than 10% difference in score, for improved dual tasking with gait.    Time  4    Period  Weeks    Status  New    Target Date  05/04/18      PT LONG TERM GOAL #4   Title  Pt will verbalize plans for ongoing community fitness upon d/c from  PT.    Time  4    Period  Weeks    Status  New    Target Date  05/04/18      PT LONG TERM GOAL #5   Title  Pt will verbalize understanding of fall prevention in home environment.    Time  4    Period  Weeks    Status  New    Target Date  05/04/18            Plan - 05/10/18 1647    Clinical Impression Statement  Reviewed today quadruped PWR! Moves exercises as well as floor transfers, and sit<>stand transfers.  Pt seems to have improved awareness and control of transfer technique compared to last visit.  Also focused today on standing exercises for posterior balance recovery and education in stance positions to offset posterior LOB in standing.  Pt will continue to benefit from skilled PT to address mobility, transfers,a nd gait for optimal safety with mobility.     Rehab Potential  Good    PT Frequency  2x / week    PT Duration  4 weeks   plus eval   PT Treatment/Interventions  ADLs/Self Care Home Management;Balance training;Therapeutic exercise;Therapeutic activities;Functional mobility training;Gait training;Neuromuscular re-education;Patient/family education    PT Next Visit Plan  stepping activities and weightshifting, balance in posterior direction; check LTGs as next week is wk 4 of 4 in POC-discuss plans for PT    Consulted and Agree with Plan of Care  Patient       Patient will benefit from skilled therapeutic intervention in order to improve the following deficits and impairments:  Abnormal gait, Decreased mobility, Difficulty walking, Decreased strength, Postural dysfunction  Visit Diagnosis: Unsteadiness on feet  Abnormal posture  Other abnormalities of gait and mobility     Problem List There are no active problems to display for this patient.   Squire Withey W. 05/10/2018, 4:51 PM  Gean Maidens., PT   [facility] [address] [place], [place], [address] Phone: [phone]   Fax:   [phone]  Name: Kelly Howard MRN: [id number] Date of Birth: [birth year]

## 2018-05-14 ENCOUNTER — Ambulatory Visit: Payer: Medicare Other | Admitting: Physical Therapy

## 2018-05-14 ENCOUNTER — Encounter: Payer: Self-pay | Admitting: Physical Therapy

## 2018-05-14 DIAGNOSIS — R2681 Unsteadiness on feet: Secondary | ICD-10-CM

## 2018-05-14 DIAGNOSIS — R293 Abnormal posture: Secondary | ICD-10-CM | POA: Diagnosis not present

## 2018-05-14 NOTE — Therapy (Signed)
[facility] [address] [place], [place], [address] Phone: [phone]   Fax:  [phone]  Physical Therapy Treatment  Patient Details  Name: Kelly Howard MRN: [id number] Date of Birth: 06/13/1950 Referring Provider (PT): Alcide Cleverlarisse Justine Goas, NP   Encounter Date: 05/14/2018  PT End of Session - 05/14/18 2203    Visit Number  6    Number of Visits  9    Date for PT Re-Evaluation  07/07/18    Authorization Type  [employer] Medicare-10th visit progress note    PT Start Time  0935    PT Stop Time  1013    PT Time Calculation (min)  38 min    Activity Tolerance  Patient tolerated treatment well    Behavior During Therapy  [facility] for tasks assessed/performed       Past Medical History:  Diagnosis Date  . Parkinson's disease (HCC)     History reviewed. No pertinent surgical history.  There were no vitals filed for this visit.  Subjective Assessment - 05/14/18 0937    Subjective  [place].  Went well.    Patient Stated Goals  To get evaluated each year for PD.    Currently in Pain?  No/denies                       [facility] Treatment/Exercise - 05/14/18 0001      Transfers   Transfers  Sit to Stand;Stand to Sit    Sit to Stand  6: Modified independent (Device/Increase time);Without upper extremity assist;From chair/3-in-1;From bed    Stand to Sit  6: Modified independent (Device/Increase time);Without upper extremity assist;To chair/3-in-1    Number of Reps  10 reps;Other sets (comment)   from 22" mat, 18" chair; from 16" chair   Transfer Cueing  Initial cues for technique, increased forward lean to initiate transfers and hinging at hips to squat to sit, for slowed descent into sitting      High Level Balance   High Level Balance Activities  Backward walking   Forward/back walking at counter, 5 reps   High Level Balance Comments  Heel/toe raisesx 20  reps, then stagger stance forward/back rocking 2 sets x 10 reps, then step back and weightshift x 2 sets x 10 reps.  Standing on blue foam, back step and weightshift, x 10 reps, then forward<>back step and weigthshift x 10 reps, for balance recovery work in posterior direction.  Four square step activity clockwise and counter clockwise 5 reps, cues for foot clearance and widened BOS upon static standing between positions of squares.      Self-Care   Self-Care  Other Self-Care Comments    Other Self-Care Comments   Discussed POC, plans for likely discharge next visit.  Pt in agreement.  Discussed options for/provided handouts for community Parkinson's exercise classes, beginning in January.             PT Education - 05/14/18 2202    Education Details  Provided handouts for PWR! Moves and PWR! [employer] Classes beginning in January; discussed d/c next visit    Person(s) Educated  Patient    Methods  Explanation;Handout    Comprehension  Verbalized understanding          PT Long Term Goals - 04/09/18 0930      PT LONG TERM GOAL #1   Title  Pt will be independent with HEP  for Parkinson's specific exercises, to address posture, balance, gait.  TARGET 05/04/18 (may be delayed due to delayed treatment start)    Time  4    Period  Weeks    Status  New    Target Date  05/04/18      PT LONG TERM GOAL #2   Title  Pt will perform at least 8 of 10 reps of sit<>stand without UE support from 18" and below with no posterior lean/loss of balance for improved efficiency and safety with transfers.    Time  4    Period  Weeks    Status  New    Target Date  05/04/18      PT LONG TERM GOAL #3   Title  Pt will perform TUG and TUG cogntivie with less than 10% difference in score, for improved dual tasking with gait.    Time  4    Period  Weeks    Status  New    Target Date  05/04/18      PT LONG TERM GOAL #4   Title  Pt will verbalize plans for ongoing community fitness upon d/c from PT.     Time  4    Period  Weeks    Status  New    Target Date  05/04/18      PT LONG TERM GOAL #5   Title  Pt will verbalize understanding of fall prevention in home environment.    Time  4    Period  Weeks    Status  New    Target Date  05/04/18            Plan - 05/14/18 2204    Clinical Impression Statement  Focused skilled PT session today on transfers and balance activities for posterior balance recovery.  Pt needs cues throughout exercises to widen BOS and to promote heelstrike for improved foot clearance.  Pt feels therapy has been very helpful to her during this time.  Will likely plan for discharge in next 1-2 visits.    Rehab Potential  Good    PT Frequency  2x / week    PT Duration  4 weeks   plus eval   PT Treatment/Interventions  ADLs/Self Care Home Management;Balance training;Therapeutic exercise;Therapeutic activities;Functional mobility training;Gait training;Neuromuscular re-education;Patient/family education    PT Next Visit Plan  Plan for d/c PT next visit.    Consulted and Agree with Plan of Care  Patient       Patient will benefit from skilled therapeutic intervention in order to improve the following deficits and impairments:  Abnormal gait, Decreased mobility, Difficulty walking, Decreased strength, Postural dysfunction  Visit Diagnosis: Unsteadiness on feet  Abnormal posture     Problem List There are no active problems to display for this patient.   Kelly Ramsaran W. 05/14/2018, 10:08 PM  Gean Maidens., PT   [facility] [address] [place], [place], [address] Phone: [phone]   Fax:  [phone]  Name: Kelly Howard MRN: [id number] Date of Birth: 27-Nov-1950

## 2018-05-16 ENCOUNTER — Ambulatory Visit: Payer: Medicare Other | Admitting: Physical Therapy

## 2018-05-16 ENCOUNTER — Encounter: Payer: Self-pay | Admitting: Physical Therapy

## 2018-05-16 DIAGNOSIS — R2681 Unsteadiness on feet: Secondary | ICD-10-CM

## 2018-05-16 DIAGNOSIS — R2689 Other abnormalities of gait and mobility: Secondary | ICD-10-CM

## 2018-05-16 DIAGNOSIS — R293 Abnormal posture: Secondary | ICD-10-CM

## 2018-05-16 NOTE — Therapy (Signed)
[facility] [address] [place], [place], [address] Phone: [phone]   Fax:  [phone]  Physical Therapy Treatment  Patient Details  Name: Kelly Howard MRN: [id number] Date of Birth: [birth year] Referring Provider (PT): Pura Spice, NP   Encounter Date: 05/16/2018  PT End of Session - 05/16/18 1117    Visit Number  7    Number of Visits  9    Date for PT Re-Evaluation  07/07/18    Authorization Type  [employer] [place] visit progress note    PT Start Time  0932    PT Stop Time  1015    PT Time Calculation (min)  43 min    Activity Tolerance  Patient tolerated treatment well    Behavior During Therapy  [facility] for tasks assessed/performed       Past Medical History:  Diagnosis Date  . Parkinson's disease ([place])     History reviewed. No pertinent surgical history.  There were no vitals filed for this visit.  Subjective Assessment - 05/16/18 0932    Subjective  Called the doctor finally, about my medication and they helped me realize to keep taking the medications on time, all the doses, including the night extended release.    Patient Stated Goals  To get evaluated each year for PD.    Currently in Pain?  No/denies         [facility] PT Assessment - 05/16/18 0001      Timed Up and Go Test   Normal TUG (seconds)  8.66    Cognitive TUG (seconds)  10.41    TUG Comments  >10% difference TUG and TUG cognitive indicates difficulty with dual tasking                   [facility] Adult PT Treatment/Exercise - 05/16/18 0001      Transfers   Transfers  Sit to Stand;Stand to Sit;Floor to Transfer   Floor to stand   Sit to Stand  6: Modified independent (Device/Increase time);Without upper extremity assist;From chair/3-in-1;From bed    Five time sit to stand comments   9.56   no posterior lean   Stand to Sit  6: Modified independent (Device/Increase time);Without upper extremity assist;To  chair/3-in-1    Floor to Transfer  5: Supervision   Cues for upright posture, wide BOS upon standing   Number of Reps  10 reps      Ambulation/Gait   Ambulation/Gait  Yes    Gait velocity  3.34 ft/sec      Self-Care   Self-Care  Other Self-Care Comments    Other Self-Care Comments   Discussed continued community fitness (Parkinson's specific) upon d/c, and pt is planning to join [employer]WR! Moves class.  Discussed benefits of consistent PD-specific exercises, especially iwth the class, reinforceing good movement pattern techniques.  Discussed fall prevention in home environment; d/c plans and plans for return screens in 6-9 months.        PWR [facility]) - 05/16/18 0957    PWR! exercises  Moves in [place];Moves in standing;Moves in supine    PWR! Up  x 5 reps    PWR! Rock  x 5 reps forward and back    PWR! Twist  x 5 reps each side    PWR! Step  x 5 reps each side    Comments  Review of PWR! Moves Quadruped HEP-pt return demo understanding    PWR! Up  x 10 reps through shoulder, then  10 reps through hips    PWR! Rock  x 5 reps each side    PWR! Twist  x 5 reps each side    PWR! Step  x 5 reps each side    Comments  Review of PWR! Moves in supine:  pt return demo understanding with min cues    PWR! Up  x 10 reps    PWR! Rock  x 10 reps    PWR! Twist  x 10 reps    PWR Step  x 10 reps    Comments  REview of standing PWR! Moves-pt return demo understanding.          PT Education - 05/16/18 1116    Education Details  Fall prevention, community fitness upon d/c from PT; plans for d/c and plans for return screens in 6-9 months    Person(s) Educated  Patient    Methods  Explanation;Handout    Comprehension  Verbalized understanding          PT Long Term Goals - 05/16/18 0935      PT LONG TERM GOAL #1   Title  Pt will be independent with HEP for Parkinson's specific exercises, to address posture, balance, gait.  TARGET 05/04/18 (may be delayed due to delayed treatment start)    Time   4    Period  Weeks    Status  Achieved      PT LONG TERM GOAL #2   Title  Pt will perform at least 8 of 10 reps of sit<>stand without UE support from 18" and below with no posterior lean/loss of balance for improved efficiency and safety with transfers.    Time  4    Period  Weeks    Status  Achieved      PT LONG TERM GOAL #3   Title  Pt will perform TUG and TUG cogntivie with less than 10% difference in score, for improved dual tasking with gait.    Time  4    Period  Weeks    Status  Not Met      PT LONG TERM GOAL #4   Title  Pt will verbalize plans for ongoing community fitness upon d/c from PT.    Baseline  Provided info on PWR! Moves and [employer] classes    Time  4    Period  Weeks    Status  Achieved      PT LONG TERM GOAL #5   Title  Pt will verbalize understanding of fall prevention in home environment.    Time  4    Period  Weeks    Status  Achieved            Plan - 05/16/18 1118    Clinical Impression Statement  LTGs assessed this visit, with pt meeting LTG 1, 2 and 4, 5.  LTG 3 for TUG/TUG cognitive scores improved, but not fully met.  Pt has been educated in HEP to address PD-specific deficits and plans to join community PWR! Moves exercise class in January.  Pt has had complaints/reports of off times with medications and is working with physician to address this.  Overall, she feels PT has been beneficial with improved movement patterns.  Pt is appropriate for discharge for this visit.     Rehab Potential  Good    PT Frequency  2x / week    PT Duration  4 weeks   plus eval   PT Treatment/Interventions  ADLs/Self Care Home Management;Balance  training;Therapeutic exercise;Therapeutic activities;Functional mobility training;Gait training;Neuromuscular re-education;Patient/family education    PT Next Visit Plan  Discharge PT this visit, plans for return screens (PT, OT, speech) in 6-9 months.    Consulted and Agree with Plan of Care  Patient       Patient  will benefit from skilled therapeutic intervention in order to improve the following deficits and impairments:  Abnormal gait, Decreased mobility, Difficulty walking, Decreased strength, Postural dysfunction  Visit Diagnosis: Unsteadiness on feet  Abnormal posture  Other abnormalities of gait and mobility     Problem List There are no active problems to display for this patient.   Kewanna Kasprzak W. 05/16/2018, 11:21 AM  Frazier Butt., PT  [facility] [address] [place], [place], [address] Phone: [phone]   Fax:  [phone]  Name: TEVIS CONGER MRN: [id number] Date of Birth: 1950/10/22   PHYSICAL THERAPY DISCHARGE SUMMARY  Visits from Start of Care: 7  Current functional level related to goals / functional outcomes: PT Long Term Goals - 05/16/18 0935      PT LONG TERM GOAL #1   Title  Pt will be independent with HEP for Parkinson's specific exercises, to address posture, balance, gait.  TARGET 05/04/18 (may be delayed due to delayed treatment start)    Time  4    Period  Weeks    Status  Achieved      PT LONG TERM GOAL #2   Title  Pt will perform at least 8 of 10 reps of sit<>stand without UE support from 18" and below with no posterior lean/loss of balance for improved efficiency and safety with transfers.    Time  4    Period  Weeks    Status  Achieved      PT LONG TERM GOAL #3   Title  Pt will perform TUG and TUG cogntivie with less than 10% difference in score, for improved dual tasking with gait.    Time  4    Period  Weeks    Status  Not Met      PT LONG TERM GOAL #4   Title  Pt will verbalize plans for ongoing community fitness upon d/c from PT.    Baseline  Provided info on PWR! Moves and Circuit classes    Time  4    Period  Weeks    Status  Achieved      PT LONG TERM GOAL #5   Title  Pt will verbalize understanding of fall prevention in home environment.    Time  4    Period   Weeks    Status  Achieved      Pt has met 4 of 5 LTGs.   Remaining deficits: Posture, off-times with increased bradykinesia noted with medication timing (has contacted MD about this); balance   Education / Equipment: Educated in [employer], fall prevention, continued community Parkinson's specific fitness options.  Plan: Patient agrees to discharge.  Patient goals were partially met. Patient is being discharged due to meeting the stated rehab goals.  ?????Plan return screens in 6-9 months due to progressive nature of disease process.    Mady Haagensen, PT 05/16/18 11:23 AM Phone: [phone] Fax: [phone]

## 2018-05-21 ENCOUNTER — Ambulatory Visit: Payer: Medicare Other | Admitting: Physical Therapy

## 2018-11-08 ENCOUNTER — Ambulatory Visit: Payer: Medicare Other

## 2018-11-08 ENCOUNTER — Ambulatory Visit: Payer: Medicare Other | Admitting: Occupational Therapy

## 2018-11-08 ENCOUNTER — Ambulatory Visit: Payer: Medicare Other | Admitting: Physical Therapy

## 2018-12-15 ENCOUNTER — Emergency Department (HOSPITAL_COMMUNITY)
Admission: EM | Admit: 2018-12-15 | Discharge: 2018-12-16 | Disposition: A | Discharge status: Home / Self Care | Payer: Medicare Other | Attending: Emergency Medicine | Admitting: Emergency Medicine

## 2018-12-15 ENCOUNTER — Other Ambulatory Visit: Payer: Self-pay

## 2018-12-15 ENCOUNTER — Emergency Department (HOSPITAL_COMMUNITY): Payer: Medicare Other

## 2018-12-15 ENCOUNTER — Encounter (HOSPITAL_COMMUNITY): Payer: Self-pay

## 2018-12-15 DIAGNOSIS — W19XXXA Unspecified fall, initial encounter: Secondary | ICD-10-CM

## 2018-12-15 DIAGNOSIS — W010XXA Fall on same level from slipping, tripping and stumbling without subsequent striking against object, initial encounter: Secondary | ICD-10-CM | POA: Diagnosis not present

## 2018-12-15 DIAGNOSIS — S81812A Laceration without foreign body, left lower leg, initial encounter: Secondary | ICD-10-CM

## 2018-12-15 DIAGNOSIS — G20 Parkinson's disease: Secondary | ICD-10-CM | POA: Diagnosis not present

## 2018-12-15 DIAGNOSIS — Y999 Unspecified external cause status: Secondary | ICD-10-CM | POA: Diagnosis not present

## 2018-12-15 DIAGNOSIS — S0003XA Contusion of scalp, initial encounter: Secondary | ICD-10-CM | POA: Diagnosis not present

## 2018-12-15 DIAGNOSIS — S0990XA Unspecified injury of head, initial encounter: Secondary | ICD-10-CM

## 2018-12-15 DIAGNOSIS — Y9301 Activity, walking, marching and hiking: Secondary | ICD-10-CM | POA: Diagnosis not present

## 2018-12-15 DIAGNOSIS — Y92008 Other place in unspecified non-institutional (private) residence as the place of occurrence of the external cause: Secondary | ICD-10-CM | POA: Diagnosis not present

## 2018-12-15 DIAGNOSIS — Z79899 Other long term (current) drug therapy: Secondary | ICD-10-CM | POA: Insufficient documentation

## 2018-12-15 NOTE — ED Triage Notes (Addendum)
Pt reports that she fell down some stairs about 1 hours ago and hit the back of her head. No obvious external hemorrhage noted. She also hit her L knee. Swelling and a lac noted. A&Ox4. Pt reports that she blacked out after the fall. Denies blood thinners

## 2018-12-15 NOTE — ED Provider Notes (Signed)
[facility] DEPT Provider Note   CSN: [id number] Arrival date & time: 12/15/18  2219     History   Chief Complaint Chief Complaint  Patient presents with   Fall   Head Injury    HPI Kelly Howard is a 68 y.o. female with a hx of parkinson's disease who presents to the ED s/p fall with head & RLE injury which occurred shortly PTA. Patient states she was walking in the garage in the dark when she went to step onto the stairs, miss-stepped, & fell backwards. She states she struck the R posterior portion of her head & briefly lost consciousness. States she injured her L lower leg with the fall. She is having pain to the head that is improved now & pain to the L lower leg. Current discomfort is mild, no alleviating/aggravating factors. She states she sustained a wound to the left leg- reports last tetanus within past 5 years. Denies change in vision, numbness, tingling, weakness, vomiting, or seizure activity. Denies prodromal lightheadedness/dizziness/chest pain/dyspnea prior to fall or at present.       HPI  Past Medical History:  Diagnosis Date   Parkinson's disease ([place])     There are no active problems to display for this patient.   History reviewed. No pertinent surgical history.   OB History   No obstetric history on file.      Home Medications    Prior to Admission medications   Medication Sig Start Date End Date Taking? Authorizing Provider  carbidopa-levodopa (SINEMET IR) 25-100 MG tablet Take by mouth. 10/06/16  Yes [provider]  clonazePAM (KLONOPIN) 0.5 MG tablet TAKE 1/2 TABLET BY MOUTH TWICE DAILY 10/17/16  Yes [provider]  levothyroxine (SYNTHROID, LEVOTHROID) 50 MCG tablet Take by mouth. 03/09/12  Yes [provider]  Pramipexole Dihydrochloride 2.25 MG TB24 Take by mouth. 09/09/16  Yes [provider]  rasagiline (AZILECT) 1 MG TABS tablet Take 1 mg by mouth. 10/06/16  Yes [provider]  DOCOSAHEXAENOIC ACID PO Take 1,000 mg by mouth.    [provider]  Probiotic Product (PROBIOTIC PO) Take 1 tablet by mouth daily. Pt takes Pensions consultant, Historical, MD    Family History History reviewed. No pertinent family history.  Social History Social History   Tobacco Use   Smoking status: Never Smoker   Smokeless tobacco: Never Used  Substance Use Topics   Alcohol use: Not on file   Drug use: Not on file     Allergies   Codeine, Escitalopram, and Propoxyphene   Review of Systems Review of Systems  Constitutional: Negative for chills and fever.  Respiratory: Negative for shortness of breath.   Cardiovascular: Negative for chest pain.  Gastrointestinal: Negative for vomiting.  Musculoskeletal: Positive for arthralgias and myalgias.  Skin: Positive for wound.  Neurological: Positive for syncope and headaches. Negative for dizziness, tremors, seizures, facial asymmetry, speech difficulty, weakness, light-headedness and numbness.  All other systems reviewed and are negative.    Physical Exam Updated Vital Signs BP 132/79 (BP Location: Right Arm)    Pulse 71    Temp 98.2 F (36.8 C) (Oral)    Resp 16    Ht 5\' 6"  (1.676 m)    Wt 61.2 kg    SpO2 98%    BMI 21.79 kg/m   Physical Exam Vitals signs and nursing note reviewed.  Constitutional:      General: She is not in acute distress.  Appearance: She is well-developed. She is not toxic-appearing.  HENT:     Head: No raccoon eyes or Battle's sign.     Comments: R parietal scalp hematoma present that is mildly tender to palpation.     Right Ear: No hemotympanum.     Left Ear: No hemotympanum.     Nose: Nose normal.     Mouth/Throat:     Pharynx: Uvula midline.  Eyes:     General:        Right eye: No discharge.        Left eye: No discharge.     Extraocular Movements: Extraocular movements intact.     Conjunctiva/sclera: Conjunctivae normal.     Pupils: Pupils are equal,  round, and reactive to light.  Neck:     Musculoskeletal: Normal range of motion and neck supple. No spinous process tenderness or muscular tenderness.  Cardiovascular:     Rate and Rhythm: Normal rate and regular rhythm.     Pulses:          Dorsalis pedis pulses are 2+ on the right side and 2+ on the left side.       Posterior tibial pulses are 2+ on the right side and 2+ on the left side.  Pulmonary:     Effort: Pulmonary effort is normal. No respiratory distress.     Breath sounds: Normal breath sounds. No wheezing, rhonchi or rales.  Chest:     Chest wall: No tenderness.  Abdominal:     General: There is no distension.     Palpations: Abdomen is soft.     Tenderness: There is no abdominal tenderness. There is no guarding or rebound.  Musculoskeletal:     Comments: Upper extremities: Normal AROM. No point/focal bony tenderness.  Back: no midline tenderness or palpable step off.  Lower extremities. 2cm fairly superficial linear laceration to the proximal anterior left lower leg w/ mild soft tissue swelling and bruising. Laceration is approximately 2 mm deep.  R lower leg with some mild abrasions. No active bleeding or appreciable FBs. Normal AROM throughout. Tender over the medial L knee & the proximal 1/3rd of the L lower leg. Otherwise nontender. No pelvic tenderness/instability.   Skin:    General: Skin is warm and dry.     Findings: No rash.  Neurological:     Mental Status: She is alert.     Comments: Clear speech. Cn III-XII grossly intact. Sensation grossly intact x 4. 5/5 symmetric grip strength & strength w/ plantar/dorsiflexion bilaterally. Able to lift both legs off of the bed without difficulty. Ambulatory.   Psychiatric:        Behavior: Behavior normal.    ED Treatments / Results  Labs (all labs ordered are listed, but only abnormal results are displayed) Labs Reviewed - No data to display  EKG None  Radiology Dg Tibia/fibula Left  Result Date:  12/16/2018 CLINICAL DATA:  Fall down stairs with left knee and lower leg pain. EXAM: LEFT TIBIA AND FIBULA - 2 VIEW COMPARISON:  None. FINDINGS: Cortical margins of the tibia and fibula are intact. There is no evidence of fracture or other focal bone lesions. Focal soft tissue prominence of the anterior upper lower leg. IMPRESSION: No fracture or subluxation of the left lower leg. Focal soft tissue prominence anteriorly may represent contusion/hematoma. Electronically Signed   By: Narda RutherfordMelanie  Sanford M.D.   On: 12/16/2018 00:39   Ct Head Wo Contrast  Result Date: 12/16/2018 CLINICAL DATA:  Fall down  stairs striking back of head. EXAM: CT HEAD WITHOUT CONTRAST CT CERVICAL SPINE WITHOUT CONTRAST TECHNIQUE: Multidetector CT imaging of the head and cervical spine was performed following the standard protocol without intravenous contrast. Multiplanar CT image reconstructions of the cervical spine were also generated. COMPARISON:  None. FINDINGS: CT HEAD FINDINGS Brain: No evidence of acute infarction, hemorrhage, hydrocephalus, extra-axial collection or mass lesion/mass effect. Mild generalized atrophy. Vascular: No hyperdense vessel. Skull: No fracture or focal lesion. Sinuses/Orbits: Paranasal sinuses and mastoid air cells are clear. Frontal sinuses are hypo pneumatized. The visualized orbits are unremarkable. Other: Small right parietal scalp hematoma. CT CERVICAL SPINE FINDINGS Alignment: Straightening of normal lordosis. No traumatic subluxation. Skull base and vertebrae: No acute fracture. Vertebral body heights are maintained. The dens and skull base are intact. Soft tissues and spinal canal: No prevertebral fluid or swelling. No visible canal hematoma. Disc levels: Multilevel endplate spurring. Disc space narrowing from C4-C5 through C6-C7. Scattered facet hypertrophy. Upper chest: Negative. Other: None. IMPRESSION: 1. Small right parietal scalp hematoma. No acute intracranial abnormality. No skull fracture. 2.  Multilevel degenerative change in the cervical spine without acute fracture or subluxation. Electronically Signed   By: Narda RutherfordMelanie  Sanford M.D.   On: 12/16/2018 00:19   Ct Cervical Spine Wo Contrast  Result Date: 12/16/2018 CLINICAL DATA:  Fall down stairs striking back of head. EXAM: CT HEAD WITHOUT CONTRAST CT CERVICAL SPINE WITHOUT CONTRAST TECHNIQUE: Multidetector CT imaging of the head and cervical spine was performed following the standard protocol without intravenous contrast. Multiplanar CT image reconstructions of the cervical spine were also generated. COMPARISON:  None. FINDINGS: CT HEAD FINDINGS Brain: No evidence of acute infarction, hemorrhage, hydrocephalus, extra-axial collection or mass lesion/mass effect. Mild generalized atrophy. Vascular: No hyperdense vessel. Skull: No fracture or focal lesion. Sinuses/Orbits: Paranasal sinuses and mastoid air cells are clear. Frontal sinuses are hypo pneumatized. The visualized orbits are unremarkable. Other: Small right parietal scalp hematoma. CT CERVICAL SPINE FINDINGS Alignment: Straightening of normal lordosis. No traumatic subluxation. Skull base and vertebrae: No acute fracture. Vertebral body heights are maintained. The dens and skull base are intact. Soft tissues and spinal canal: No prevertebral fluid or swelling. No visible canal hematoma. Disc levels: Multilevel endplate spurring. Disc space narrowing from C4-C5 through C6-C7. Scattered facet hypertrophy. Upper chest: Negative. Other: None. IMPRESSION: 1. Small right parietal scalp hematoma. No acute intracranial abnormality. No skull fracture. 2. Multilevel degenerative change in the cervical spine without acute fracture or subluxation. Electronically Signed   By: Narda RutherfordMelanie  Sanford M.D.   On: 12/16/2018 00:19   Dg Knee Complete 4 Views Left  Result Date: 12/16/2018 CLINICAL DATA:  Fall down stairs with left knee and lower leg pain. EXAM: LEFT KNEE - COMPLETE 4+ VIEW COMPARISON:  None.  FINDINGS: No evidence of fracture, dislocation, or joint effusion. My knee medial tibiofemoral and patellofemoral spurring. No evidence of arthropathy or other focal bone abnormality. Soft tissues are unremarkable. IMPRESSION: 1. No fracture or subluxation of the left knee. 2. Mild osteoarthritis. Electronically Signed   By: Narda RutherfordMelanie  Sanford M.D.   On: 12/16/2018 00:38    Procedures .Marland Kitchen.Laceration Repair  Date/Time: 12/16/2018 12:34 AM Performed by: Cherly AndersonPetrucelli, Shelisa Fern R, PA-C Authorized by: Cherly AndersonPetrucelli, Avalee Castrellon R, PA-C   Consent:    Consent obtained:  Verbal   Consent given by:  Patient   Risks discussed:  Infection, need for additional repair, nerve damage, poor wound healing, poor cosmetic result, pain, retained foreign body, tendon damage and vascular damage   Alternatives discussed:  No treatment Anesthesia (see MAR for exact dosages):    Anesthesia method:  Local infiltration   Local anesthetic:  Lidocaine 1% w/o epi Laceration details:    Location:  Leg   Leg location:  L lower leg   Length (cm):  2   Depth (mm):  2 Repair type:    Repair type:  Simple Pre-procedure details:    Preparation:  Patient was prepped and draped in usual sterile fashion and imaging obtained to evaluate for foreign bodies Exploration:    Hemostasis achieved with:  Direct pressure   Wound exploration: wound explored through full range of motion and entire depth of wound probed and visualized   Treatment:    Area cleansed with:  Betadine   Amount of cleaning:  Extensive   Irrigation solution:  Sterile water   Irrigation method:  Pressure wash Skin repair:    Repair method:  Sutures   Suture size:  4-0   Suture material:  Nylon   Suture technique:  Simple interrupted   Number of sutures:  2 Approximation:    Approximation:  Close Post-procedure details:    Dressing:  Antibiotic ointment and non-adherent dressing   Patient tolerance of procedure:  Tolerated well, no immediate complications    (including critical care time)  Medications Ordered in ED Medications - No data to display   Initial Impression / Assessment and Plan / ED Course  I have reviewed the triage vital signs and the nursing notes.  Pertinent labs & imaging results that were available during my care of the patient were reviewed by me and considered in my medical decision making (see chart for details).   Patient presents to the ED s/p mechanical fall. Nontoxic appearing, no apparent distress, vitals WNL. No signs of serious head/neck/back/intra-thoracic/abdominal injury. R parietal scalp hematoma present- CT head/Cspine negative for acute process other than hematoma. No midline lumbar/thoracic/coccygeal/sacral tenderness. Chest/abdomen nontender. MSK x-rays negative. Regarding laceration: Pressure irrigation performed. Wound explored and base of wound visualized in a bloodless field without evidence of foreign body. Laceration repair per procedure note above, tolerated well. Tetanus is up to date. NVI distally prior to and following procedure. Discussed suture home care as well as need for wound recheck and suture removal in 8-10 days.  I discussed results, treatment plan, need for follow-up, and return precautions with the patient including signs of infection. Provided opportunity for questions, patient confirmed understanding and is in agreement with plan.    Findings and plan of care discussed with supervising physician Dr. Preston FleetingGlick who independently evaluated patient &  is in agreement.   Final Clinical Impressions(s) / ED Diagnoses   Final diagnoses:  Fall, initial encounter  Injury of head, initial encounter  Laceration of left lower extremity, initial encounter    ED Discharge Orders    None       Cherly Andersonetrucelli, Xandria Gallaga R, PA-C 12/16/18 0104    Dione BoozeGlick, David, MD 12/16/18 [phone]

## 2018-12-16 ENCOUNTER — Emergency Department (HOSPITAL_COMMUNITY): Payer: Medicare Other

## 2018-12-16 MED ORDER — LIDOCAINE HCL 1 % IJ SOLN
INTRAMUSCULAR | Status: AC
  Filled 2018-12-16: qty 20

## 2018-12-16 MED ORDER — LIDOCAINE HCL (PF) 1 % IJ SOLN
5.0000 mL | Freq: Once | INTRAMUSCULAR | Status: AC
  Administered 2018-12-16: 5 mL
  Filled 2018-12-16: qty 30

## 2018-12-16 NOTE — Discharge Instructions (Addendum)
You were seen in the emergency department today for a a fall. Your imaging did not show acute fracture, dislocation, or brain bleed. It did show a bump on the back of your head otherwise known as a hematoma. Your laceration was closed with 2 stitches. Please keep this area clean and dry for the next 24 hours, after 24 hours you may get this area wet, but avoid soaking the area. Keep the area covered as best possible especially when in the sun to help in minimizing scarring.    You will need to have the stitches removed and the wound rechecked in 8-10 days. Please return to the emergency department, go to an urgent care, or see your primary care provider to have this performed. Return to the ER soon should you start to experience pus type drainage from the wound, redness around the wound, or fevers as this could indicate the area is infected, please return to the ER for any other worsening symptoms or concerns that you may have.

## 2018-12-25 DIAGNOSIS — S52501A Unspecified fracture of the lower end of right radius, initial encounter for closed fracture: Secondary | ICD-10-CM | POA: Insufficient documentation

## 2019-03-07 ENCOUNTER — Other Ambulatory Visit: Payer: Self-pay

## 2019-03-07 ENCOUNTER — Telehealth: Payer: Self-pay | Admitting: Occupational Therapy

## 2019-03-07 ENCOUNTER — Ambulatory Visit: Payer: Medicare Other | Attending: Obstetrics and Gynecology | Admitting: Physical Therapy

## 2019-03-07 ENCOUNTER — Ambulatory Visit: Payer: Medicare Other | Admitting: Occupational Therapy

## 2019-03-07 ENCOUNTER — Ambulatory Visit: Payer: Medicare Other

## 2019-03-07 DIAGNOSIS — R41841 Cognitive communication deficit: Secondary | ICD-10-CM | POA: Insufficient documentation

## 2019-03-07 DIAGNOSIS — R29818 Other symptoms and signs involving the nervous system: Secondary | ICD-10-CM | POA: Insufficient documentation

## 2019-03-07 DIAGNOSIS — R471 Dysarthria and anarthria: Secondary | ICD-10-CM | POA: Insufficient documentation

## 2019-03-07 DIAGNOSIS — R2689 Other abnormalities of gait and mobility: Secondary | ICD-10-CM

## 2019-03-07 NOTE — Therapy (Signed)
[facility] [address] [place], [place], [address] Phone: [phone]   Fax:  [phone]  Patient Details  Name: Kelly Howard MRN: [id number] Date of Birth: [birth year] Referring Provider:  Arvella Nigh, MD  Encounter Date: 03/07/2019  Occupational Therapy Parkinson's Disease Screen  Hand dominance:  RUE  Physical Performance Test item #2 (simulated eating):  11.34 sec  Fastening/unfastening 3 buttons in:  23.65sec  9-hole peg test:    RUE  25.47  sec        LUE  24.63 sec  Box & Blocks Test:   RUE  57 blocks        LUE  48 blocks   Change in ability to perform ADLs/IADLs:  Yes, multiple falls with recent wrist fx. Pt reports pain and increased difficutly with cooking. Pt is noted to have significant dyskinesias, and bradykinesia  Other Comments:  Handwriting is legible and good letter size   Pt would benefit from occupational therapy evaluation due to  Decline in ADLS, recent falls.  Jaesean Litzau 03/07/2019, 8:07 AM  [facility] [address] [place], [place], [address] Phone: [phone]   Fax:  [phone]

## 2019-03-07 NOTE — Therapy (Signed)
[facility] [address] [place], [place], [address] Phone: [phone]   Fax:  [phone]  Patient Details  Name: Kelly Howard MRN: [id number] Date of Birth: Nov 03, 1950 Referring Provider:  Arvella Nigh, MD  Encounter Date: 03/07/2019  Physical Therapy Parkinson's Disease Screen   Timed Up and Go test:  11.47 sec  10 meter walk test: 10.29 sec = 3.19 ft/sec  5 time sit to stand test: 13.22 sec  Pt has had several falls in the past 6 months, one resulting in wrist fracture.  Patient would benefit from Physical Therapy evaluation due to slowed mobility measures and recent of falls.   Reference numbers from d/c from PT 12/19:  8.66 TGU, 10.41 sec cog; 9.56 5x, 3.34 ft/sec   MARRIOTT,AMY W. 03/07/2019, 8:31 AM  Mady Haagensen, PT 03/07/19 8:41 AM Phone: [phone] Fax: [facility] [address] [place], [place], [address] Phone: [phone]   Fax:  [phone]

## 2019-03-07 NOTE — Therapy (Signed)
[facility] [address] [place], [place], [address] Phone: [phone]   Fax:  [phone]  Patient Details  Name: Kelly Howard MRN: [id number] Date of Birth: 1950/07/07 Referring Provider:  Aileen Fass, MD  Encounter Date: 03/07/2019   Speech Therapy Parkinson's Disease Screen   Decibel Level today: mid to upper 60s dB  (WNL=70-72 dB) in simple conversation with sound level meter 30cm away from pt's mouth. Pt reports people have not been asking her to repeat herself. SLP explained benefits of early ST with mild difficulty noted, and that data suggest that earlier therapy results in better outcomes than therapy when real difficulties arise. 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  Pt does not report difficulty in swallowing warranting further evaluation. Pt was being followed by Dr Tonye Royalty, however, after his departure will be under the care of Dr. Linus Mako.   Pt would benefit from speech-language eval for dysarthria - please order via EPIC or send a script to [phone].   [facility] ,[place], [place]  03/07/2019, 8:26 AM  [facility] [address] [place], [place], [address] Phone: [phone]   Fax:  [phone]

## 2019-03-07 NOTE — Telephone Encounter (Signed)
Dr. Linus Mako,  Modena Slater was seen for Parkinson's screens today. She can benefit from PT, OT and ST referrals.  If you agree please send these referrals . Our phone number is [phone], and fax is [phone]. (Pt was previously seen by Dr. Tonye Royalty, but she reports you will be taking over in his absence.)  Sincerely, Theone Murdoch, OTR/L

## 2019-04-02 ENCOUNTER — Encounter: Payer: Self-pay | Admitting: Neurology

## 2019-04-10 ENCOUNTER — Telehealth: Payer: Self-pay

## 2019-04-10 ENCOUNTER — Ambulatory Visit: Payer: Self-pay | Admitting: *Deleted

## 2019-04-10 NOTE — Telephone Encounter (Signed)
Copied from [place] [id number]. Topic: General - Other >> Apr 10, 2019  1:06 PM Rayann Heman wrote: Reason for CRM: pt called and would like to know if Dr Regis Bill would accept her as a patient. Pt states that her sister in law carolin ann shank is a current pt and gave her this recommendation.

## 2019-04-10 NOTE — Telephone Encounter (Signed)
Patient calls with numbers for her BP that have been low recently. 101/55today. Prior to today 70's over 40's-50's at times. She has experienced dizziness with the low readings.Does not have a pcp currently. Has hx of Parkinson's. Recommended when she experiences the low blood pressure with dizziness to lie down and elevate feet and have someone drive her to UC or ED at that time. Today she is not currently experiencing any symptoms. Made a new patient establishment appointment with Dr. Jerilee Hoh for tomorrow. No fever/no covid contacts/no travels.  Answer Assessment - Initial Assessment Questions 1. BLOOD PRESSURE: "What is the blood pressure?" "Did you take at least two measurements 5 minutes apart?"     101/64   Today   75/48  yesterday 2. ONSET: "When did you take your blood pressure?"    Today 30 minutes ago. 3. HOW: "How did you obtain the blood pressure?" (e.g., visiting nurse, automatic home BP monitor)     Home monitor 4. HISTORY: "Do you have a history of low blood pressure?" "What is your blood pressure normally?"     Yes at times. 5. MEDICATIONS: "Are you taking any medications for blood pressure?" If yes: "Have they been changed recently?"     no 6. PULSE RATE: "Do you know what your pulse rate is?"      no 7. OTHER SYMPTOMS: "Have you been sick recently?" "Have you had a recent injury?"    Mild ear pain and sore throat. 8. PREGNANCY: "Is there any chance you are pregnant?" "When was your last menstrual period?"     na  Protocols used: LOW BLOOD PRESSURE-A-AH

## 2019-04-11 ENCOUNTER — Encounter: Payer: Self-pay | Admitting: Internal Medicine

## 2019-04-11 ENCOUNTER — Other Ambulatory Visit: Payer: Self-pay

## 2019-04-11 ENCOUNTER — Ambulatory Visit: Payer: Medicare Other | Admitting: Internal Medicine

## 2019-04-11 DIAGNOSIS — E039 Hypothyroidism, unspecified: Secondary | ICD-10-CM | POA: Diagnosis not present

## 2019-04-11 DIAGNOSIS — M810 Age-related osteoporosis without current pathological fracture: Secondary | ICD-10-CM

## 2019-04-11 DIAGNOSIS — G20 Parkinson's disease: Secondary | ICD-10-CM | POA: Diagnosis not present

## 2019-04-11 DIAGNOSIS — R296 Repeated falls: Secondary | ICD-10-CM

## 2019-04-11 DIAGNOSIS — G20A1 Parkinson's disease without dyskinesia, without mention of fluctuations: Secondary | ICD-10-CM

## 2019-04-11 NOTE — Patient Instructions (Signed)
-  Nice seeing you today!!  -Schedule follow up for your physical. Please come in fasting that day.   

## 2019-04-11 NOTE — Progress Notes (Signed)
New Patient Office Visit     CC/Reason for Visit: Establish care, concerned about low BPs Previous PCP: none Last Visit: unknown  HPI: Kelly Howard is a 68 y.o. female who is coming in today for the above mentioned reasons. She has been using her GYN as her PCP. Her PMH is significant for hypothyroidism, parkinson's disease and osteoporosis on Boniva. She has been having frequent falls recently. Has had a broken wrist and broken ribs this year. Her prior neurologist at [facility] retired and she has scheduled an appointment with Dr. Carles Collet. She comes in with her husband today. He has been concerned about some low BP readings recently. BP at home has been around 110/60 but had an instance where her BP was 70/50. She was dizzy at that time. She has an appointment to see PT/OT/ST that specialize in patient's with Parkinson's Disease.  Past Medical/Surgical History: Past Medical History:  Diagnosis Date  . Frequent falls   . Hypothyroidism   . Osteoporosis   . Parkinson's disease ([place])     No past surgical history on file.  Social History:  reports that she has never smoked. She has never used smokeless tobacco. No history on file for alcohol and drug.  Allergies: Allergies  Allergen Reactions  . Codeine Other (See Comments)    GI Upset  . Escitalopram Swelling    Foot swelling  . Propoxyphene Other (See Comments)    Hallucinations    Family History:  No h/o heart disease, cancer and stroke that she is aware of.  Current Outpatient Medications:  .  acetaminophen (TYLENOL) 500 MG tablet, Take by mouth., Disp: , Rfl:  .  carbidopa-levodopa (SINEMET IR) 25-100 MG tablet, Take by mouth., Disp: , Rfl:  .  clonazePAM (KLONOPIN) 0.5 MG tablet, TAKE 1/2 TABLET BY MOUTH TWICE DAILY, Disp: , Rfl:  .  ibuprofen (ADVIL) 800 MG tablet, ibuprofen 800 mg tablet, Disp: , Rfl:  .  levothyroxine (SYNTHROID, LEVOTHROID) 50 MCG tablet, Take by mouth., Disp: , Rfl:  .  Pramipexole  Dihydrochloride 2.25 MG TB24, Take by mouth., Disp: , Rfl:  .  rasagiline (AZILECT) 1 MG TABS tablet, Take 1 mg by mouth., Disp: , Rfl:  .  sertraline (ZOLOFT) 50 MG tablet, sertraline 50 mg tablet, Disp: , Rfl:   Review of Systems:  Constitutional: Denies fever, chills, diaphoresis, appetite change and fatigue.  HEENT: Denies photophobia, eye pain, redness, hearing loss, ear pain, congestion, sore throat, rhinorrhea, sneezing, mouth sores, trouble swallowing, neck pain, neck stiffness and tinnitus.   Respiratory: Denies SOB, DOE, cough, chest tightness,  and wheezing.   Cardiovascular: Denies chest pain, palpitations and leg swelling.  Gastrointestinal: Denies nausea, vomiting, abdominal pain, diarrhea, constipation, blood in stool and abdominal distention.  Genitourinary: Denies dysuria, urgency, frequency, hematuria, flank pain and difficulty urinating.  Endocrine: Denies: hot or cold intolerance, sweats, changes in hair or nails, polyuria, polydipsia. Musculoskeletal: Denies myalgias, back pain, joint swelling, arthralgias and gait problem.  Skin: Denies pallor, rash and wound.  Neurological: Denies dizziness, seizures, syncope, light-headedness, numbness and headaches.  Hematological: Denies adenopathy. Easy bruising, personal or family bleeding history  Psychiatric/Behavioral: Denies suicidal ideation, mood changes, confusion, nervousness, sleep disturbance and agitation    Physical Exam: Vitals:   04/11/19 1448 04/11/19 1631  BP: 130/70 110/60  Pulse: 76   Temp: 97.9 F (36.6 C)   TempSrc: Temporal   SpO2: 97%   Weight: 130 lb 6.4 oz (59.1 kg)   Height: 5\' 6"  (  1.676 m)    Body mass index is 21.05 kg/m.  Constitutional: NAD, calm, comfortable Eyes: PERRL, lids and conjunctivae normal, wears corrective lenses ENMT: Mucous membranes are moist. Respiratory: clear to auscultation bilaterally, no wheezing, no crackles. Normal respiratory effort. No accessory muscle use.   Cardiovascular: Regular rate and rhythm, no murmurs / rubs / gallops. No extremity edema. 2+ pedal pulses.  Abdomen: no tenderness, no masses palpated. No hepatosplenomegaly. Bowel sounds positive.  Musculoskeletal: no clubbing / cyanosis. No joint deformity upper and lower extremities. Good ROM, no contractures. Normal muscle tone.  Skin: no rashes, lesions, ulcers. No induration  Psychiatric: Normal judgment and insight. Alert and oriented x 3. Normal mood.    Impression and Plan:  Hypothyroidism, unspecified type -Check TSH next visit, continue current synthroid dose.  Parkinson's disease (HCC) -Has f/u appointment with new neurologist soon.  Frequent falls -Likely due to Parkinson's disease. -Has appointment with PT/OT/ST.  Age-related osteoporosis without current pathological fracture -Continue Boniva.     Patient Instructions  -Nice seeing you today!!  -Schedule follow up for your physical. Please come in fasting that day.     Chaya Jan, MD [facility]

## 2019-04-23 NOTE — Progress Notes (Deleted)
Kelly Howard was seen today in the movement disorders clinic for neurologic consultation at the request of [name], [name], *.  The consultation is for the evaluation of ***.  The records that were made available to me were reviewed which took 40 min of nonface to face time  Patient was diagnosed with Parkinson's disease in 2009, after presenting with left upper extremity rest tremor.  Her first medication was pramipexole, along with Azilect.  She has been [facility] at [facility] by Dr. Zola ButtonHaq.  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  In August, 2016, the patient was started on levodopa.  With time, her pramipexole was increased all the way to 4.5 mg of the extended release per day.  However, she ultimately experienced compulsive side effects consisting of impulsive eating and impulsive spending.  Therefore, the medication was slowly reduced and her levodopa was slowly increased with the course of time.  In February, 2018, her Azilect was discontinued.  She was last seen at [facility] on November 02, 2018.  Patient has been enrolled in physical therapy at [facility].  I just got a note from her physical therapist.  She noted that the patient has been having a lot of dyskinesias and dystonia and near constant movement during the evaluation, including picking at her hair and being distracted and fiddling with papers.  She also noted 12 falls since January, with at least 2 fractures.   Patient's current movement disorder medications include: Pramipexole ER, 0.5 mg daily. Carbidopa/levodopa 25/100, 1 tablet at 6 AM/9 AM/noon/3 PM/6 PM Carbidopa/levodopa 25/100 CR, 1 tablet at bedtime Clonazepam 0.5 mg, half a tablet twice per day Sertraline 50 mg daily  Specific Symptoms:  Tremor: {yes  no:314532} Family hx of similar:  {yes no:314532} Voice: *** Sleep: ***  Vivid Dreams:  {yes no:314532}  Acting out dreams:  {yes no:314532} Wet Pillows: {yes no:314532} Postural symptoms:  {yes no:314532}  Falls?  Yes.  , 12 falls this year, 2 of which resulted in fractures Bradykinesia symptoms: {parkinson brady:18041} Loss of smell:  {yes no:314532} Loss of taste:  {yes no:314532} Urinary Incontinence:  {yes no:314532} Difficulty Swallowing:  {yes no:314532} Handwriting, micrographia: {yes no:314532} Trouble with ADL's:  {yes no:314532}  Trouble buttoning clothing: {yes no:314532} Depression:  {yes no:314532} Memory changes:  {yes no:314532} Hallucinations:  {yes no:314532}  visual distortions: {yes no:314532} N/V:  {yes no:314532} Lightheaded:  {yes no:314532}  Syncope: {yes no:314532} Diplopia:  {yes no:314532} Dyskinesia:  Yes.   Prior exposure to reglan/antipsychotics: {yes no:314532}  Neuroimaging of the brain has *** previously been performed.  It *** available for my review today.  PREVIOUS MEDICATIONS: {Parkinson's RX:18200} ropinirole (tried for a week and was worried about side effects and did not take); Paxil 2004; Celexa 2004; Lexapro  ALLERGIES:   Allergies  Allergen Reactions  . Codeine Other (See Comments)    GI Upset  . Escitalopram Swelling    Foot swelling  . Propoxyphene Other (See Comments)    Hallucinations    CURRENT MEDICATIONS:  Current Outpatient Medications  Medication Instructions  . acetaminophen (TYLENOL) 500 MG tablet Oral  . carbidopa-levodopa (SINEMET IR) 25-100 MG tablet Oral  . clonazePAM (KLONOPIN) 0.5 MG tablet TAKE 1/2 TABLET BY MOUTH TWICE DAILY  . ibuprofen (ADVIL) 800 MG tablet  ibuprofen 800 mg tablet  . levothyroxine (SYNTHROID, LEVOTHROID) 50 MCG tablet Oral  . Pramipexole Dihydrochloride 2.25 MG TB24 Oral  . rasagiline (AZILECT) 1 mg, Oral  . sertraline (ZOLOFT) 50 MG tablet sertraline 50 mg tablet    PAST MEDICAL  HISTORY:   Past Medical History:  Diagnosis Date  . Frequent falls   . Hypothyroidism   . Osteoporosis   . Parkinson's disease ([place])     PAST SURGICAL HISTORY:  No past surgical history on file.  SOCIAL HISTORY:   Social History   Socioeconomic History  . Marital status: Married    Spouse name: Not on file  . Number of children: Not on file  . Years of education: Not on file  . Highest education level: Not on file  Occupational History  . Not on file  Social Needs  . Financial resource strain: Not on file  . Food insecurity    Worry: Not on file    Inability: Not on file  . Transportation needs    Medical: Not on file    Non-medical: Not on file  Tobacco Use  . Smoking status: Never Smoker  . Smokeless tobacco: Never Used  Substance and Sexual Activity  . Alcohol use: Not on file  . Drug use: Not on file  . Sexual activity: Not on file  Lifestyle  . Physical activity    Days per week: Not on file    Minutes per session: Not on file  . Stress: Not on file  Relationships  . Social Herbalist on phone: Not on file    Gets together: Not on file    Attends religious service: Not on file    Active member of club or organization: Not on file    Attends meetings of clubs or organizations: Not on file    Relationship status: Not on file  . Intimate partner violence    Fear of current or ex partner: Not on file    Emotionally abused: Not on file    Physically abused: Not on file    Forced sexual activity: Not on file  Other Topics Concern  . Not on file  Social History Narrative  . Not on file    FAMILY HISTORY:   No family status information on file.    ROS:  ROS  PHYSICAL EXAMINATION:    VITALS:  There were no vitals filed for this visit.  GEN:  The patient appears stated age and is in NAD. HEENT:  Normocephalic, atraumatic.  The mucous membranes are moist. The superficial temporal arteries are without ropiness or tenderness. CV:  RRR Lungs:   CTAB Neck/HEME:  There are no carotid bruits bilaterally.  Neurological examination:  Orientation: The patient is alert and oriented x3. Fund of knowledge is appropriate.  Recent and remote memory are intact.  Attention and concentration are normal.    Able to name objects and repeat phrases. Cranial nerves: There is good facial symmetry. Pupils are equal round and reactive to light bilaterally. Fundoscopic exam reveals clear margins bilaterally. Extraocular muscles are intact. The visual fields are full to confrontational testing. The speech is fluent and clear. Soft palate rises symmetrically and there is no tongue deviation. Hearing is intact to conversational tone. Sensation: Sensation is intact to light and pinprick throughout (facial, trunk, extremities). Vibration is intact at the bilateral big toe. There is no extinction with double simultaneous stimulation. There is no sensory dermatomal level identified. Motor: Strength is 5/5  in the bilateral upper and lower extremities.   Shoulder shrug is equal and symmetric.  There is no pronator drift. Deep tendon reflexes: Deep tendon reflexes are 2/4 at the bilateral biceps, triceps, brachioradialis, patella and achilles. Plantar responses are downgoing bilaterally.  Movement examination: Tone: There is ***tone in the bilateral upper extremities.  The tone in the lower extremities is ***.  Abnormal movements: *** Coordination:  There is *** decremation with RAM's, *** Gait and Station: The patient has *** difficulty arising out of a deep-seated chair without the use of the hands. The patient's stride length is ***.  The patient has a *** pull test.      Labs: No recent available  ASSESSMENT/PLAN:  ***1.  Parkinson's disease, diagnosed in 2009 and treated mostly at [facility] by Dr. 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   Cc:  Philip Aspen, Limmie Patricia, MD

## 2019-04-29 ENCOUNTER — Other Ambulatory Visit: Payer: Self-pay

## 2019-04-29 ENCOUNTER — Ambulatory Visit: Payer: Medicare Other

## 2019-04-29 ENCOUNTER — Ambulatory Visit: Payer: Medicare Other | Attending: Obstetrics and Gynecology | Admitting: Physical Therapy

## 2019-04-29 ENCOUNTER — Encounter: Payer: Self-pay | Admitting: Physical Therapy

## 2019-04-29 ENCOUNTER — Ambulatory Visit: Payer: Medicare Other | Admitting: Occupational Therapy

## 2019-04-29 VITALS — BP 117/70 | HR 76

## 2019-04-29 DIAGNOSIS — R2689 Other abnormalities of gait and mobility: Secondary | ICD-10-CM | POA: Insufficient documentation

## 2019-04-29 DIAGNOSIS — R41841 Cognitive communication deficit: Secondary | ICD-10-CM | POA: Insufficient documentation

## 2019-04-29 DIAGNOSIS — R2681 Unsteadiness on feet: Secondary | ICD-10-CM | POA: Diagnosis present

## 2019-04-29 DIAGNOSIS — R471 Dysarthria and anarthria: Secondary | ICD-10-CM | POA: Insufficient documentation

## 2019-04-29 DIAGNOSIS — R293 Abnormal posture: Secondary | ICD-10-CM

## 2019-04-29 DIAGNOSIS — R29818 Other symptoms and signs involving the nervous system: Secondary | ICD-10-CM | POA: Insufficient documentation

## 2019-04-29 NOTE — Therapy (Signed)
[facility] [address] [place], [place], [address] Phone: [phone]   Fax:  [phone]  Physical Therapy Evaluation  Patient Details  Name: Kelly Howard MRN: [id number] Date of Birth: 1950-12-14 Referring Provider (PT): Siddiqui (Pt is seeing Dr. Arbutus Leas for first visit 05/01/2019)   Encounter Date: 04/29/2019  PT End of Session - 04/29/19 1527    Visit Number  1    Number of Visits  17    Date for PT Re-Evaluation  07/28/19    Authorization Type  [employer] Medicare; copay for PT/speech combined; separate copay for OT    PT Start Time  1024    PT Stop Time  1110    PT Time Calculation (min)  46 min    Activity Tolerance  Patient tolerated treatment well    Behavior During Therapy  Restless;Anxious;Impulsive       Past Medical History:  Diagnosis Date  . Frequent falls   . Hypothyroidism   . Osteoporosis   . Parkinson's disease (HCC)     History reviewed. No pertinent surgical history.  Vitals:   04/29/19 1025  BP: 117/70  Pulse: 76  Attempted standing BP with automatic cuff x 2, with error reading, with manual cuff in standing, systolic is 88; unable to accurately hear diastolic.   Subjective Assessment - 04/29/19 1025    Subjective  Have had about 12 falls since January, with other times I may feel like I may fall.  To see Dr. Arbutus Leas tomorrow for my first visit with her.  This past week, I fell and fractured L wrist.  One fall was down the stairs.  Some of the falls, I pitch forward even though I'm going backwards.  I feel like I have a harder time moving at times.  Does not use assistive device.    Patient Stated Goals  Pt's goal for therapy is to walk without fear of falling.    Currently in Pain?  Yes    Pain Score  1     Pain Location  Wrist    Pain Orientation  Left    Pain Descriptors / Indicators  Aching    Pain Type  Acute pain    Pain Onset  In the past 7 days    Pain Frequency  Intermittent     Aggravating Factors   worse during the day    Pain Relieving Factors  ibuprofen     Pt has been seen by Dr. Mina Marble and will be following up with him regarding wrist pain.  Pt defers OT evaluation today due to recent wrist fracture.    [facility] PT Assessment - 04/29/19 1032      Assessment   Medical Diagnosis  Parkinson's disease    Referring Provider (PT)  Siddiqui   Pt is seeing Dr. Arbutus Leas for first visit 05/01/2019   Onset Date/Surgical Date  --   January 2020   Prior Therapy  Previous bout of therapy d/c 05/16/18      Precautions   Precautions  Fall    Precaution Comments  L wrist fracture, wearing splint (given by Dr. Mina Marble)      Balance Screen   Has the patient fallen in the past 6 months  Yes    How many times?  12   since January   Has the patient had a decrease in activity level because of a fear of falling?   No    Is the patient reluctant to leave  their home because of a fear of falling?   No      Home Nurse, mental healthnvironment   Living Environment  Private residence    Living Arrangements  Spouse/significant other    Available Help at Discharge  Family    Type of Home  House    Home Access  Stairs to enter    Entrance Stairs-Number of Steps  4    Entrance Stairs-Rails  Right;Left;Cannot reach both    Home Layout  Two level;Able to live on main level with bedroom/bathroom    Home Equipment  None      Prior Function   Level of Independence  Independent    Leisure  Has participated in [employer] classes and PD dance classes in the past; keeps grandchildren for weeks at a time (has done this in the past)      Posture/Postural Control   Posture/Postural Control  Postural limitations    Postural Limitations  Rounded Shoulders;Forward head   Trunk lean to right, head tilted R and rotated L   Posture Comments  Trunk/lower extremity dyskinesias noted during eval; reports toes curling upon waking up in the mornings-fearful of going down stairs alone      ROM / Strength   AROM /  PROM / Strength  Strength      Strength   Overall Strength Comments  Grossly tested at least 4/5 bilateral lower extremities      Transfers   Transfers  Sit to Stand;Stand to Sit    Sit to Stand  6: Modified independent (Device/Increase time);Without upper extremity assist;From chair/3-in-1   posterior pull upon trying to stand, increases with reps   Five time sit to stand comments   8.47    Stand to Sit  6: Modified independent (Device/Increase time);Without upper extremity assist;To chair/3-in-1;Uncontrolled descent      Ambulation/Gait   Ambulation/Gait  Yes    Ambulation/Gait Assistance  5: Supervision    Ambulation/Gait Assistance Details  Pt has several episodes of foot catching with gait (L and R)-pt reports this is how she has lost her balance at times.    Ambulation Distance (Feet)  200 Feet    Assistive device  None    Gait Pattern  Step-through pattern;Decreased arm swing - right;Decreased arm swing - left;Decreased dorsiflexion - right;Decreased dorsiflexion - left;Poor foot clearance - left;Poor foot clearance - right   Extensor posturing through arms with gait   Ambulation Surface  Level;Indoor    Gait velocity  11.15 sec = 2.94 ft/sec    Stairs  Yes    Stairs Assistance  5: Supervision    Stairs Assistance Details (indicate cue type and reason)  When demonstrating how she performs stairs at home, pt runs up and down 4 steps in gym.  PT provides cues to slow pace of stair negotiation, given hx of falls.    Stair Management Technique  One rail Right;Alternating pattern;Forwards    Number of Stairs  4   x 2   Height of Stairs  6      Standardized Balance Assessment   Standardized Balance Assessment  Dynamic Gait Index;Timed Up and Go Test      Dynamic Gait Index   Level Surface  Mild Impairment    Change in Gait Speed  Mild Impairment   R toe catches with gait   Gait with Horizontal Head Turns  Mild Impairment    Gait with Vertical Head Turns  Mild Impairment     Gait and Pivot  Turn  Normal    Step Over Obstacle  Mild Impairment    Step Around Obstacles  Mild Impairment    Steps  Mild Impairment    Total Score  17    DGI comment:  Scores <19/24 indicate increased fall risk.      Timed Up and Go Test   TUG  Normal TUG;Manual TUG;Cognitive TUG    Normal TUG (seconds)  9.25    Manual TUG (seconds)  10.53    Cognitive TUG (seconds)  12.81    TUG Comments  Scores >10% difference indicate difficulty with dual tasking with gait.                Objective measurements completed on examination: See above findings.                PT Short Term Goals - 04/29/19 1542      PT SHORT TERM GOAL #1   Title  Pt will be independent with HEP to address balance, transfers, gait.  TARGET 05/23/2019    Time  4    Period  Weeks    Status  New    Target Date  05/23/19      PT SHORT TERM GOAL #2   Title  Pt will perform sit<>stand at least 8 of 10 reps from low surfaces, simulating sofa, with no posterior lean, modified independently.    Time  4    Period  Weeks    Status  New    Target Date  05/23/19      PT SHORT TERM GOAL #3   Title  Pt will verbalize understanding of fall prevention in home environment.    Time  4    Period  Weeks    Status  New    Target Date  05/23/19        PT Long Term Goals - 04/29/19 1544      PT LONG TERM GOAL #1   Title  Pt will be independent with HEP for Parkinson's specific exercises, to address posture, transfers, balance, gait.  TARGET 06/21/2019    Time  8    Period  Weeks    Status  New    Target Date  06/21/19      PT LONG TERM GOAL #2   Title  Pt will perform TUG and TUG cognitive within 10% time difference, for improved ability for dual tasking with gait.    Time  8    Period  Weeks    Status  New    Target Date  06/21/19      PT LONG TERM GOAL #3   Title  Pt will improve DGI score to at least 20/24 for decreased fall risk.    Time  8    Period  Weeks    Status  New    Target  Date  06/21/19      PT LONG TERM GOAL #4   Title  Pt will verbalize plans for ongoing community fitness upon d/c from PT.    Time  8    Period  Weeks    Target Date  06/21/19             Plan - 04/29/19 1529    Clinical Impression Statement  Pt is a 68 yo female, known to this PT from previous bouts of therapy, who presents to OPPT with Parkinson's disease and hx of falls.  She has had at least 12 falls in the past  11 months, with more reported near-falls.  She is at fall risk per DGI score of 17/24, and demonstrates decreased dual tasking with gait and cognitive tasks.  During evaluation, she exhibits trunk and lower extremity dyskinesias in sitting and extensor, internal rotation posturing of BUEs with gait.  Pt also reports toes curling in R foot in the mornings.  She presents with decreased balance, abnormal posture, postural instability, decreased coordination and timing of gait, decreased safety awareness with stair negotiation.  Also, of note, blood pressure measures taken today with systolic BP in standing 117 and systolic in standing 88 (unable to get full reading in standing, due to faint BP-unable to accurately assess).  She would benefit from skilled PT to address the above stated deficits to decrease fall risk and to improve functional mobility.    Personal Factors and Comorbidities  Behavior Pattern;Comorbidity 3+    Comorbidities  osteoporosis, R wrist fracture (11/2018), L wrist fracture (04/22/2019), hx of rib fracture    Examination-Activity Limitations  Stand;Stairs;Transfers;Locomotion Level    Examination-Participation Restrictions  Community Activity   Exercise   Stability/Clinical Decision Making  Evolving/Moderate complexity    Clinical Decision Making  Moderate    Rehab Potential  Good    PT Frequency  2x / week    PT Duration  8 weeks   plus eva   PT Treatment/Interventions  ADLs/Self Care Home Management;Gait training;Stair training;Functional mobility  training;Therapeutic activities;Therapeutic exercise;Balance training;Patient/family education;Neuromuscular re-education;DME Instruction    PT Next Visit Plan  Check posterior push and release; work on hip, ankle, step strategies, posture and balance exercises    Consulted and Agree with Plan of Care  Patient       Patient will benefit from skilled therapeutic intervention in order to improve the following deficits and impairments:  Abnormal gait, Decreased balance, Decreased mobility, Decreased knowledge of precautions, Decreased coordination, Difficulty walking, Decreased safety awareness, Decreased strength, Postural dysfunction  Visit Diagnosis: Other abnormalities of gait and mobility  Unsteadiness on feet  Abnormal posture  Other symptoms and signs involving the nervous system     Problem List Patient Active Problem List   Diagnosis Date Noted  . Hypothyroidism   . Parkinson's disease (HCC)   . Frequent falls   . Osteoporosis     Kelly Howard W. 04/29/2019, 3:47 PM Lonia Blood, PT 04/29/19 3:50 PM Phone: [phone] Fax: [phone]   [facility] [address] [place], [place], [address] Phone: [phone]   Fax:  [phone]  Name: Kelly Howard MRN: [id number] Date of Birth: 04/06/1951

## 2019-04-30 ENCOUNTER — Ambulatory Visit: Payer: Medicare Other | Admitting: Neurology

## 2019-04-30 NOTE — Therapy (Signed)
[facility] [address] [place], [place], [address] Phone: [phone]   Fax:  [phone]  Speech Language Pathology Evaluation  Patient Details  Name: Kelly GroutDeborah Lowman Howard MRN: [id number] Date of Birth: 11/04/1950 Referring Provider (SLP): Kelly Howard, Mustafa, Kelly Howard; pt asked for cc: to TatLurena Howard, [name], Kelly Howard   Encounter Date: 04/29/2019  End of Session - 04/30/19 1055    Visit Number  1    Number of Visits  17    Date for SLP Re-Evaluation  07/26/19    SLP Start Time  0936    SLP Stop Time   1016    SLP Time Calculation (min)  40 min    Activity Tolerance  Patient tolerated treatment well       Past Medical History:  Diagnosis Date  . Frequent falls   . Hypothyroidism   . Osteoporosis   . Parkinson's disease (HCC)     History reviewed. No pertinent surgical history.  There were no vitals filed for this visit.  Subjective Assessment - 04/30/19 0838    Subjective  Pt arrives with lt arm and wrist immobilized due to fall approx two weeks ago. Her rt wrist was broken, due to a fall, before her "PD screen last month.    Currently in Pain?  Yes    Pain Score  1     Pain Location  Wrist    Pain Orientation  Left    Pain Descriptors / Indicators  Aching    Pain Type  Acute pain    Pain Onset  In the past 7 days         [facility] - 04/30/19 [id number]      SLP Visit Information   SLP Received On  04/29/19    Referring Provider (SLP)  Kelly Howard, Mustafa, Kelly Howard; pt asked for cc: to Kelly Howard, Kelly Joinerebecca, Kelly Howard    Medical Diagnosis  PD      Subjective   Subjective  "How did I Kelly Howard? Was it wrong?"    Patient/Family Stated Goal  Improve cognition, speech volume      General Information   HPI  Pt with PD screen 03-07-19 demonstrating decr'd speech volume and probable cognitive communication deficits. Pt with at least two falls in last 5 months requring immobilzation of both arms/wrists. Fall in July was down the gargage steps in  the dark with reported LOC.  Pt is currently in transition of PD physician from Kelly Howard to Kelly Howard (appointment 04-30-19)      Balance Screen   Has the patient fallen in the past 6 months  Yes    How many times?  "at least 2"      Prior Functional Status   Cognitive/Linguistic Baseline  Within functional limits    Type of Home  House     Lives With  Spouse      Cognition   Overall Cognitive Status  Impaired/Different from baseline    Area of Impairment  Attention;Safety/judgement;Awareness;Problem solving    Attention Comments  sustained/selective attention decr'd as pt consistently demonstrated impulsivity    Safety and Judgement Comments  Impulsivity seen today during eval. When pt was told about this and that impulsivity likely played a role in pt's falls she stated, "I can see where that might be true."    Awareness Comments  safety awareness primarily; decr'd attention and presence of impulsivity play a role in this    Problem Solving Comments  seen during trail making today - pt  unable to problem solve how to modify her response to make it clear how she self-corrected    Behaviors  Impulsive;Restless;Poor frustration tolerance      Auditory Comprehension   Overall Auditory Comprehension  Appears within functional limits for tasks assessed      Verbal Expression   Overall Verbal Expression  Appears within functional limits for tasks assessed      Oral Motor/Sensory Function   Overall Oral Motor/Sensory Function  Other (comment)   deferred due to masking policy     Motor Speech   Phonation  Low vocal intensity   average mid to upper 60s dB in evaluaiton   Intelligibility  Intelligible   intelligibility would decr in min-mod background noise   Effective Techniques  --   request for repeat improved pt's volume level                     SLP Education - 04/30/19 1048    Education Details  results so far of cog-comm eval, SLP to work with pt cognition/communication  first, then with speech volume    Person(s) Educated  Patient    Methods  Explanation    Comprehension  Verbalized understanding   pt agreed with cognition first, then volume      SLP Short Term Goals - 04/30/19 1059      SLP SHORT TERM GOAL #1   Title  pt will demo selective attention to therapy tasks for 10 minutes in min noisy environment over two sessions    Time  4    Period  Weeks    Status  New      SLP SHORT TERM GOAL #2   Title  pt will demo improved awareness by double checking answers/work in therapy tasks 100% of the time over three sessions    Time  4    Period  Weeks    Status  New      SLP SHORT TERM GOAL #3   Title  pt will demo functional reasoning/problem solving skills in simple functional linguistic tasks in 3 therapy sessions    Time  4    Period  Weeks    Status  New      SLP SHORT TERM GOAL #4   Title  pt will complete full cognitive-linguistic testing    Time  1    Period  Weeks    Status  New       SLP Long Term Goals - 04/30/19 1102      SLP LONG TERM GOAL #1   Title  pt will demonstrate WFL selective atteniton in min-mod noisy environment for 15 mintue long therapy task x3 sessions    Time  8    Period  Weeks   or 17 sessions, for all LTGs   Status  New      SLP LONG TERM GOAL #2   Title  pt will demo improved problem solving ability in lingustic problem solving tasks with rare min A over three sessions    Time  8    Period  Weeks    Status  New      SLP LONG TERM GOAL #3   Title  pt will improve speech volume to upper 60s - low 70s dB in 10 minutes simple conversation x3 sessions    Time  8    Period  Weeks    Status  New       Plan - 04/30/19 1056  Clinical Impression Statement  Pt presents with cognitive communicaiton deficits at least in the areas of attention, awareness (primarily safety awareness), and problem solving ID'd by pt performance on subtests of Cognitive Linguistic Quick Test (CLQT) today - testing will be  completed next session. Pt also presents with dysarthria caused by decr'd breath support for speech resulting in average volume in mid-upper 60s dB. Pt would benefit from skilled ST targeting cognitive commnication skills as well as improving pt volume in conversation. Pt agrees that work on Immunologist would best be done first, then work with improving pt's volume.    Speech Therapy Frequency  2x / week    Duration  --   8 weeks, or 17 total visits   Treatment/Interventions  Environmental controls;Compensatory techniques;Cueing hierarchy;Cognitive reorganization;Internal/external aids;Patient/family education;SLP instruction and feedback;Functional tasks    Potential to Achieve Goals  Good    Potential Considerations  Ability to learn/carryover information;Severity of impairments       Patient will benefit from skilled therapeutic intervention in order to improve the following deficits and impairments:   Dysarthria and anarthria - Plan: SLP plan of care cert/re-cert  Cognitive communication deficit - Plan: SLP plan of care cert/re-cert    Problem List Patient Active Problem List   Diagnosis Date Noted  . Hypothyroidism   . Parkinson's disease ([place])   . Frequent falls   . Osteoporosis     Kelly Howard ,MS, CCC-SLP  04/30/2019, 11:48 AM  [facility] [address] [place], [place], [address] Phone: [phone]   Fax:  [phone]  Name: Kelly Howard MRN: [id number] Date of Birth: Jan 15, 1951

## 2019-05-02 ENCOUNTER — Ambulatory Visit: Payer: Medicare Other | Attending: Obstetrics and Gynecology | Admitting: Physical Therapy

## 2019-05-02 ENCOUNTER — Other Ambulatory Visit: Payer: Self-pay

## 2019-05-02 ENCOUNTER — Encounter: Payer: Self-pay | Admitting: Physical Therapy

## 2019-05-02 ENCOUNTER — Ambulatory Visit: Payer: Medicare Other | Admitting: Speech Pathology

## 2019-05-02 DIAGNOSIS — R2689 Other abnormalities of gait and mobility: Secondary | ICD-10-CM

## 2019-05-02 DIAGNOSIS — R2681 Unsteadiness on feet: Secondary | ICD-10-CM | POA: Insufficient documentation

## 2019-05-02 DIAGNOSIS — R471 Dysarthria and anarthria: Secondary | ICD-10-CM | POA: Insufficient documentation

## 2019-05-02 DIAGNOSIS — R293 Abnormal posture: Secondary | ICD-10-CM | POA: Diagnosis present

## 2019-05-02 DIAGNOSIS — R41841 Cognitive communication deficit: Secondary | ICD-10-CM | POA: Insufficient documentation

## 2019-05-02 DIAGNOSIS — R29818 Other symptoms and signs involving the nervous system: Secondary | ICD-10-CM | POA: Diagnosis present

## 2019-05-02 NOTE — Therapy (Signed)
[facility] [address] [place], [place], [address] Phone: [phone]   Fax:  [phone]  Speech Language Pathology Treatment  Patient Details  Name: Kelly Howard MRN: [id number] Date of Birth: 1951/02/09 Referring Provider (SLP): Kelly Abrahams, MD; pt asked for cc: to Kelly Howard Guiles, DO   Encounter Date: 05/02/2019  End of Session - 05/02/19 1303    Visit Number  2    Number of Visits  17    Date for SLP Re-Evaluation  07/26/19    SLP Start Time  1150    SLP Stop Time   1230    SLP Time Calculation (min)  40 min    Activity Tolerance  Patient tolerated treatment well       Past Medical History:  Diagnosis Date  . Frequent falls   . Hypothyroidism   . Osteoporosis   . Parkinson's disease ([place])     No past surgical history on file.  There were no vitals filed for this visit.  Subjective Assessment - 05/02/19 1301    Subjective  Verbal and tactile cues for attention and stability needed when walking down hall from PT    Currently in Pain?  Yes    Pain Score  4     Pain Location  Wrist    Pain Onset  In the past 7 days            ADULT SLP TREATMENT - 05/02/19 1300      General Information   Behavior/Cognition  Alert;Distractible;Requires cueing      Treatment Provided   Treatment provided  Cognitive-Linquistic      Cognitive-Linquistic Treatment   Treatment focused on  Cognition;Patient/family/caregiver education    Skilled Treatment  Patient required verbal and tactile cues for attention during walk down hall from PT session. Completed CLQT administration, scoring to be completed by primary therapist. Impulsivity, attention and awareness were noted as deficient throughout session. Pt asked SLP when next appointment was and wrote this on a scrap of paper (asked same question at checkout 10 minutes later). Near end of session, pt told SLP she drove herself to therapy today. SLP told pt  about safety concerns and do not recommend driving at this time due to attention/cognitive deficits. Therapist asked pt if we could call someone to drive her and she declined. Requested pt have someone drive her to future sessions.       Assessment / Recommendations / Plan   Plan  Continue with current plan of care      Progression Toward Goals   Progression toward goals  Progressing toward goals         SLP Short Term Goals - 05/02/19 1304      SLP SHORT TERM GOAL #1   Title  pt will demo selective attention to therapy tasks for 10 minutes in min noisy environment over two sessions    Time  4    Period  Weeks    Status  On-going      SLP SHORT TERM GOAL #2   Title  pt will demo improved awareness by double checking answers/work in therapy tasks 100% of the time over three sessions    Time  4    Period  Weeks    Status  On-going      SLP SHORT TERM GOAL #3   Title  pt will demo functional reasoning/problem solving skills in simple functional linguistic tasks in 3 therapy sessions  Time  4    Period  Weeks    Status  On-going      SLP SHORT TERM GOAL #4   Title  pt will complete full cognitive-linguistic testing    Time  1    Period  Weeks    Status  Achieved       SLP Long Term Goals - 05/02/19 1305      SLP LONG TERM GOAL #1   Title  pt will demonstrate WFL selective atteniton in min-mod noisy environment for 15 mintue long therapy task x3 sessions    Time  8    Period  Weeks   or 17 sessions, for all LTGs   Status  On-going      SLP LONG TERM GOAL #2   Title  pt will demo improved problem solving ability in lingustic problem solving tasks with rare min A over three sessions    Time  8    Period  Weeks    Status  On-going      SLP LONG TERM GOAL #3   Title  pt will improve speech volume to upper 60s - low 70s dB in 10 minutes simple conversation x3 sessions    Time  8    Period  Weeks    Status  On-going       Plan - 05/02/19 1303    Clinical  Impression Statement  Pt presents with cognitive communicaiton deficits at least in the areas of attention, awareness (primarily safety awareness), and problem solving ID'd by pt performance on subtests of Cognitive Linguistic Quick Test (CLQT) today - testing completed today and scoring reported by primary therapist. Patient drove herself to therapy today. SLP strongly discouraged this due to severity of cognitive deficits. Pt also presents with dysarthria caused by decr'd breath support for speech resulting in average volume in mid-upper 60s dB. Pt would benefit from skilled ST targeting cognitive commnication skills as well as improving pt volume in conversation. Pt agrees that work on Conservation officer, nature would best be done first, then work with improving pt's volume.    Speech Therapy Frequency  2x / week    Duration  --   8 weeks, or 17 total visits   Treatment/Interventions  Environmental controls;Compensatory techniques;Cueing hierarchy;Cognitive reorganization;Internal/external aids;Patient/family education;SLP instruction and feedback;Functional tasks    Potential to Achieve Goals  Good    Potential Considerations  Ability to learn/carryover information;Severity of impairments       Patient will benefit from skilled therapeutic intervention in order to improve the following deficits and impairments:   Cognitive communication deficit  Dysarthria and anarthria    Problem List Patient Active Problem List   Diagnosis Date Noted  . Hypothyroidism   . Parkinson's disease (HCC)   . Frequent falls   . Osteoporosis    Rondel Baton, MS, CCC-SLP Speech-Language Pathologist  Arlana Lindau 05/02/2019, 1:06 PM  [facility] [address] [place], [place], [address] Phone: [phone]   Fax:  [phone]   Name: Kelly Howard MRN: [id number] Date of Birth: 1950-11-03

## 2019-05-02 NOTE — Patient Instructions (Addendum)
Practice walking with heels striking first (Heel to toe walking), with feet wide as your shoulders  Allow your arms to relax and swing as you are walking  SLOW Down and pay attention to your walking pattern  HIP / KNEE: Extension - Sit to Stand    Sitting, lean chest forward, raise hips up from surface. Straighten hips and knees. Weight bear equally on left and right sides. Backs of legs should not push off surface.  When you sit back down, stick your bottom out and squat slowly to sit. _10__ reps per set, _1-2__ sets per day  Copyright  VHI. All rights reserved.

## 2019-05-03 NOTE — Therapy (Signed)
[facility] [address] [place], [place], [address] Phone: [phone]   Fax:  [phone]  Physical Therapy Treatment  Patient Details  Name: Kelly Howard MRN: [id number] Date of Birth: 07-Jan-1951 Referring Provider (PT): Siddiqui (Pt is seeing Dr. Arbutus Leas for first visit 05/01/2019)   Encounter Date: 05/02/2019  PT End of Session - 05/03/19 1445    Visit Number  2    Number of Visits  17    Date for PT Re-Evaluation  07/28/19    Authorization Type  [employer]HC Medicare; copay for PT/speech combined; separate copay for OT    PT Start Time  1106    PT Stop Time  1148    PT Time Calculation (min)  42 min    Activity Tolerance  Patient tolerated treatment well    Behavior During Therapy  Restless;Anxious;Impulsive       Past Medical History:  Diagnosis Date  . Frequent falls   . Hypothyroidism   . Osteoporosis   . Parkinson's disease (HCC)     History reviewed. No pertinent surgical history.  There were no vitals filed for this visit.  Subjective Assessment - 05/02/19 1109    Subjective  Was too late for Dr. Arbutus Leas the other day so I couldn't be seen.  Did not fall, but had 2 near falls.    Patient Stated Goals  Pt's goal for therapy is to walk without fear of falling.    Currently in Pain?  Yes    Pain Score  4     Pain Location  Wrist    Pain Orientation  Right;Left    Pain Descriptors / Indicators  Aching    Pain Type  Acute pain    Pain Onset  In the past 7 days    Pain Frequency  Intermittent    Aggravating Factors   worse during the day    Pain Relieving Factors  ibuprofen                       [facility] Adult PT Treatment/Exercise - 05/03/19 1428      Transfers   Transfers  Sit to Stand;Stand to Sit    Sit to Stand  5: Supervision;4: Min guard;Without upper extremity assist;From bed;From chair/3-in-1    Sit to Stand Details  Tactile cues for sequencing;Verbal cues for technique;Verbal  cues for sequencing    Stand to Sit  5: Supervision;Without upper extremity assist;To bed;To chair/3-in-1;Uncontrolled descent    Stand to Sit Details (indicate cue type and reason)  Tactile cues for sequencing;Tactile cues for placement;Verbal cues for sequencing;Verbal cues for technique    Stand to Sit Details  Cues provided for slowed pace, squatting to sit, to avoid uncontrolled descent into sitting    Number of Reps  10 reps      Ambulation/Gait   Ambulation/Gait  Yes    Ambulation/Gait Assistance  5: Supervision    Ambulation/Gait Assistance Details  Attempted resisted gait x 115 ft (with PT tech providing resistance) with therapist min guard/min assistance, to avoid LOB.  Cues given for upright posture, heelstrike arm swing with gait.  With resistance removed, additional 230 ft with close supervision for slowed pace, deliberate heelstrike and arm swing.    Ambulation Distance (Feet)  100 Feet   x 4 reps, with cues for heelstrike, relaxed arm swing   Assistive device  None    Gait Pattern  Step-through pattern;Decreased arm swing - right;Decreased arm swing -  left;Decreased dorsiflexion - right;Decreased dorsiflexion - left;Poor foot clearance - left;Poor foot clearance - right   Extensor posturing with gait   Ambulation Surface  Level;Indoor      Neuro Re-ed    Neuro Re-ed Details   Standing with feet shoulder width apart with resistance provided in anterior/posterior/lateral directions.  PT provides initial resistance with resisted cord, then transitions to manual resistance, as pt is overpowering therapist resistance and would fall if unaided, when PT lessens resistance.            Balance Exercises - 05/03/19 1435      Balance Exercises: Standing   Wall Bumps  Hip    Wall Bumps-Hips  Anterior/posterior;Eyes opened;10 reps   At counter, cues for technique   Retro Gait  Upper extremity support;3 reps   Fwd/back walking at counter-cues for foot placement   Heel Raises  Limitations  x 10 reps    Toe Raise Limitations  x 10 reps    Other Standing Exercises  At counter, with forward/back walking, cues for heelstrike, foot clearance with forward gait (as pt tends to lead with toes)        PT Education - 05/03/19 1444    Education Details  Initiated HEP-sit to stand, gait with SLOWED pace, attention to heelstrike, posture, arm swing    Person(s) Educated  Patient    Methods  Explanation;Demonstration;Verbal cues;Handout;Tactile cues    Comprehension  Verbalized understanding;Returned demonstration;Verbal cues required;Need further instruction       PT Short Term Goals - 04/29/19 1542      PT SHORT TERM GOAL #1   Title  Pt will be independent with HEP to address balance, transfers, gait.  TARGET 05/23/2019    Time  4    Period  Weeks    Status  New    Target Date  05/23/19      PT SHORT TERM GOAL #2   Title  Pt will perform sit<>stand at least 8 of 10 reps from low surfaces, simulating sofa, with no posterior lean, modified independently.    Time  4    Period  Weeks    Status  New    Target Date  05/23/19      PT SHORT TERM GOAL #3   Title  Pt will verbalize understanding of fall prevention in home environment.    Time  4    Period  Weeks    Status  New    Target Date  05/23/19        PT Long Term Goals - 04/29/19 1544      PT LONG TERM GOAL #1   Title  Pt will be independent with HEP for Parkinson's specific exercises, to address posture, transfers, balance, gait.  TARGET 06/21/2019    Time  8    Period  Weeks    Status  New    Target Date  06/21/19      PT LONG TERM GOAL #2   Title  Pt will perform TUG and TUG cognitive within 10% time difference, for improved ability for dual tasking with gait.    Time  8    Period  Weeks    Status  New    Target Date  06/21/19      PT LONG TERM GOAL #3   Title  Pt will improve DGI score to at least 20/24 for decreased fall risk.    Time  8    Period  Weeks    Status  New    Target Date   06/21/19      PT LONG TERM GOAL #4   Title  Pt will verbalize plans for ongoing community fitness upon d/c from PT.    Time  8    Period  Weeks    Target Date  06/21/19            Plan - 05/03/19 1446    Clinical Impression Statement  Skilled PT session focused on transfer technique, balance strategy work and gait training.  With repeated cues, pt is able to perform improved transfers and slowed, improved gait pattern.  With resisted standing and gait activities, pt demonstrates decreased postural stability, decreased postural awareness, as with repeated instructions to maintain balance, she has excess lean with decreased hip strategy or step strategy to regain balance-needs therapist assitsance to avoid falling.  Pt will continue to benefit from skilled PT to address balance, transfers, gait for decreased fall risk.    Personal Factors and Comorbidities  Behavior Pattern;Comorbidity 3+    Comorbidities  osteoporosis, R wrist fracture (11/2018), L wrist fracture (04/22/2019), hx of rib fracture    Examination-Activity Limitations  Stand;Stairs;Transfers;Locomotion Level    Examination-Participation Restrictions  Community Activity   Exercise   Stability/Clinical Decision Making  Evolving/Moderate complexity    Rehab Potential  Good    PT Frequency  2x / week    PT Duration  8 weeks   plus eva   PT Treatment/Interventions  ADLs/Self Care Home Management;Gait training;Stair training;Functional mobility training;Therapeutic activities;Therapeutic exercise;Balance training;Patient/family education;Neuromuscular re-education;DME Instruction    PT Next Visit Plan  Review HEP; work on hip, ankle, step strategies, posture and balance exercises; manual resistance for balance perturbations, gait training    Consulted and Agree with Plan of Care  Patient       Patient will benefit from skilled therapeutic intervention in order to improve the following deficits and impairments:  Abnormal gait,  Decreased balance, Decreased mobility, Decreased knowledge of precautions, Decreased coordination, Difficulty walking, Decreased safety awareness, Decreased strength, Postural dysfunction  Visit Diagnosis: Other abnormalities of gait and mobility  Unsteadiness on feet  Abnormal posture  Other symptoms and signs involving the nervous system     Problem List Patient Active Problem List   Diagnosis Date Noted  . Hypothyroidism   . Parkinson's disease (HCC)   . Frequent falls   . Osteoporosis     Nashton Belson W. 05/03/2019, 2:50 PM  [name],Kaleel Schmieder W., PT  [facility] [address] [place], [place], [address] Phone: [phone]   Fax:  [phone]  Name: Kelly Howard MRN: [id number] Date of Birth: 07/26/1950

## 2019-05-10 ENCOUNTER — Ambulatory Visit: Payer: Medicare Other

## 2019-05-10 ENCOUNTER — Ambulatory Visit: Payer: Medicare Other | Admitting: Physical Therapy

## 2019-05-20 ENCOUNTER — Ambulatory Visit: Payer: Medicare Other

## 2019-05-20 ENCOUNTER — Ambulatory Visit: Payer: Medicare Other | Admitting: Physical Therapy

## 2019-05-22 ENCOUNTER — Ambulatory Visit: Payer: Medicare Other | Admitting: Physical Therapy

## 2019-05-22 ENCOUNTER — Encounter: Payer: Self-pay | Admitting: Physical Therapy

## 2019-05-22 ENCOUNTER — Ambulatory Visit: Payer: Medicare Other

## 2019-05-22 ENCOUNTER — Other Ambulatory Visit: Payer: Self-pay

## 2019-05-22 DIAGNOSIS — R2689 Other abnormalities of gait and mobility: Secondary | ICD-10-CM | POA: Diagnosis not present

## 2019-05-22 DIAGNOSIS — R41841 Cognitive communication deficit: Secondary | ICD-10-CM

## 2019-05-22 DIAGNOSIS — R29818 Other symptoms and signs involving the nervous system: Secondary | ICD-10-CM

## 2019-05-22 DIAGNOSIS — R471 Dysarthria and anarthria: Secondary | ICD-10-CM

## 2019-05-22 NOTE — Therapy (Signed)
[facility] [address] [place], [place], [address] Phone: [phone]   Fax:  [phone]  Physical Therapy Treatment  Patient Details  Name: Georgiana Spillane MRN: [id number] Date of Birth: 12/03/1950 Referring Provider (PT): Siddiqui (Pt is seeing Dr. Carles Collet for first visit 05/01/2019)   Encounter Date: 05/22/2019  PT End of Session - 05/22/19 1555    Visit Number  3    Number of Visits  17    Date for PT Re-Evaluation  07/28/19    Authorization Type  [employer]HC Medicare; copay for PT/speech combined; separate copay for OT    PT Start Time  1405    PT Stop Time  1445    PT Time Calculation (min)  40 min    Activity Tolerance  Patient tolerated treatment well    Behavior During Therapy  Restless;Anxious;Impulsive       Past Medical History:  Diagnosis Date  . Frequent falls   . Hypothyroidism   . Osteoporosis   . Parkinson's disease ([place])     History reviewed. No pertinent surgical history.  There were no vitals filed for this visit.  Subjective Assessment - 05/22/19 1414    Subjective  Has had another fall since last visit.  Fall happened in the kitchen in same location as previous fall.  Continues to have LOB and falls backwards.  Still having issues with her medication because she has not seen Dr Tat yet.  Has an appointment on 06/10/19 with Dr Tat.  Lots of questions during sessions regarding medication, sleep, anxiety, PD progression.    Patient Stated Goals  Pt's goal for therapy is to walk without fear of falling.    Currently in Pain?  No/denies   has L wrist pain at times due to recent fracture.  Still has splint on L arm   Pain Onset  --        [facility] Adult PT Treatment/Exercise - 05/22/19 0001      Self-Care   Self-Care  Other Self-Care Comments      Pt presenting to clinic today with numerous concerns.  Pt reporting having issues getting her prescription refills due to changing MD's (Dr Tonye Royalty no  longer at [facility] and has appt with Dr Carles Collet 06/10/19). When pulled up pts medication list in Epic as last written by Dr Tonye Royalty, pt reports sometimes taking 1 carbidopa and sometimes 1.5 tablets.  States she bites pills in half sometimes.  States that she forgets to take Her medication at times despite using a pill box and reminders on her phone.  She feels like she sometimes takes dose twice because if she cant remember if she took it then she takes it again.  Unable to confirm which Medications she takes at which times.  Recommended for pt to bring in her medication bottles/medication list and her pill box on next visit to see if we can help pt come up with a system for taking as prescribed by Dr Tonye Royalty as written In [place].  Made reminder note for pt to bring these items in. Pt also has been to ortho md concerning her L wrist and is to wear splint x 2 more weeks then f/u with MD on option of surgery to L wrist if not healing properly. Pt asking about cycling classes and states that she has a stationary bike at home but does not seem to be able to ride as long as before.  Discussed starting with 10 minute increments and  working up tolerance to 30 minutes On her stationary bike.   Pt asking questions about resuming seeing her personal trainer given her wrist fracture.  Recommended for pt to continue with PT until f/u with ortho md then reassess resuming with her trainer due to wrist restrictions with exercise. Pt also reports having "difficulty catching my breath" when I go to be at night but states that she does take her Klonopin as prescribed and that seems to calm her down and is able to breath easier. Pt anxious during entire session with dyskinesias to trunk, LE's and UE's along with intentional repetitive movements (moving legs, fixing hair, adjusting mask)    PT Education - 05/22/19 1554    Education Details  Pt to bring in medications and pill box to assist pt with developing a better system for making  sure she has taken her meds as prescribed, verbally reviewed HEP    Person(s) Educated  Patient    Methods  Explanation;Handout   provided pt with a reminder note on bringing medication next visit   Comprehension  Verbalized understanding       PT Short Term Goals - 04/29/19 1542      PT SHORT TERM GOAL #1   Title  Pt will be independent with HEP to address balance, transfers, gait.  TARGET 05/23/2019    Time  4    Period  Weeks    Status  New    Target Date  05/23/19      PT SHORT TERM GOAL #2   Title  Pt will perform sit<>stand at least 8 of 10 reps from low surfaces, simulating sofa, with no posterior lean, modified independently.    Time  4    Period  Weeks    Status  New    Target Date  05/23/19      PT SHORT TERM GOAL #3   Title  Pt will verbalize understanding of fall prevention in home environment.    Time  4    Period  Weeks    Status  New    Target Date  05/23/19        PT Long Term Goals - 04/29/19 1544      PT LONG TERM GOAL #1   Title  Pt will be independent with HEP for Parkinson's specific exercises, to address posture, transfers, balance, gait.  TARGET 06/21/2019    Time  8    Period  Weeks    Status  New    Target Date  06/21/19      PT LONG TERM GOAL #2   Title  Pt will perform TUG and TUG cognitive within 10% time difference, for improved ability for dual tasking with gait.    Time  8    Period  Weeks    Status  New    Target Date  06/21/19      PT LONG TERM GOAL #3   Title  Pt will improve DGI score to at least 20/24 for decreased fall risk.    Time  8    Period  Weeks    Status  New    Target Date  06/21/19      PT LONG TERM GOAL #4   Title  Pt will verbalize plans for ongoing community fitness upon d/c from PT.    Time  8    Period  Weeks    Target Date  06/21/19  Plan - 05/22/19 1556    Clinical Impression Statement  Skilled session consisted of answering pts questions/concerns regarding numerous self reported  issues.  Pt appears very anxious about numerous PD issues along with waiting to see Dr Tat.  Pt is not consistently taking her medication and appears confused as to dosage and timing of medication.  Pt still has to wear L wrist splint x 2 more weeks then to see Ortho MD again to determine if surgery is needed.  Significant dystkinesias during treatment today in trunk, LE and UE with head leaning to R, rotation to L and leans R with frequent repetative movements as well (fixing mask, fixing hair, crossing legs).    Personal Factors and Comorbidities  Behavior Pattern;Comorbidity 3+    Comorbidities  osteoporosis, R wrist fracture (11/2018), L wrist fracture (04/22/2019), hx of rib fracture    Examination-Activity Limitations  Stand;Stairs;Transfers;Locomotion Level    Examination-Participation Restrictions  Community Activity   Exercise   Stability/Clinical Decision Making  Evolving/Moderate complexity    Rehab Potential  Good    PT Frequency  2x / week    PT Duration  8 weeks   plus eva   PT Treatment/Interventions  ADLs/Self Care Home Management;Gait training;Stair training;Functional mobility training;Therapeutic activities;Therapeutic exercise;Balance training;Patient/family education;Neuromuscular re-education;DME Instruction    PT Next Visit Plan  See if pt brought medications and pill box to discuss system to take as prescribed.  Review HEP; work on hip, ankle, step strategies, posture and balance exercises; manual resistance for balance perturbations, gait training    Consulted and Agree with Plan of Care  Patient       Patient will benefit from skilled therapeutic intervention in order to improve the following deficits and impairments:  Abnormal gait, Decreased balance, Decreased mobility, Decreased knowledge of precautions, Decreased coordination, Difficulty walking, Decreased safety awareness, Decreased strength, Postural dysfunction  Visit Diagnosis: Other abnormalities of gait and  mobility  Other symptoms and signs involving the nervous system     Problem List Patient Active Problem List   Diagnosis Date Noted  . Hypothyroidism   . Parkinson's disease (HCC)   . Frequent falls   . Osteoporosis    Newell Coralenise Terry Kirby [place], [place] [facility] 05/22/19 4:20 PM Phone: [phone] Fax: [phone]   [facility] [address] [place], [place], [address] Phone: [phone]   Fax:  [phone]  Name: Alessandra GroutDeborah Lowman Dicocco MRN: [id number] Date of Birth: 07/29/1950

## 2019-05-22 NOTE — Therapy (Signed)
[facility] [address] [place], [place], [address] Phone: [phone]   Fax:  [phone]  Speech Language Pathology Treatment  Patient Details  Name: Kelly Howard MRN: [id number] Date of Birth: [birth year] Referring Provider (SLP): Jaquita Folds, MD; pt asked for cc: to TatLurena Joiner, DO   Encounter Date: 05/22/2019  End of Session - 05/22/19 1637    Visit Number  3    Number of Visits  17    Date for SLP Re-Evaluation  07/26/19    SLP Start Time  1320    SLP Stop Time   1400    SLP Time Calculation (min)  40 min       Past Medical History:  Diagnosis Date  . Frequent falls   . Hypothyroidism   . Osteoporosis   . Parkinson's disease (HCC)     History reviewed. No pertinent surgical history.  There were no vitals filed for this visit.  Subjective Assessment - 05/22/19 1323    Currently in Pain?  Yes    Pain Score  3     Pain Location  Wrist    Pain Orientation  Right;Left    Pain Descriptors / Indicators  Aching    Pain Type  Acute pain    Pain Onset  1 to 4 weeks ago            ADULT SLP TREATMENT - 05/22/19 1326      General Information   Behavior/Cognition  Alert;Distractible;Requires cueing;Cooperative      Treatment Provided   Treatment provided  Cognitive-Linquistic      Cognitive-Linquistic Treatment   Treatment focused on  Cognition;Patient/family/caregiver education    Skilled Treatment  Significant dystonias noted. Pt began walking down towards gym and questioned, "Down this way?" - SLP corrected pt. SLP worked with pt on functional task requiring sustained attention and awareness of errors. PT very impulsive during this task partly due to decr'd sustained attention. Pt told SLP that it is hard for her to focus now - SLP reiterated that this was one of the things that SLP noticed during the evaluation.       Assessment / Recommendations / Plan   Plan  Continue with  current plan of care      Progression Toward Goals   Progression toward goals  Progressing toward goals       SLP Education - 05/22/19 1637    Education Details  pt should consider not driving    Person(s) Educated  Patient    Methods  Explanation    Comprehension  Verbalized understanding       SLP Short Term Goals - 05/22/19 1320      SLP SHORT TERM GOAL #1   Title  pt will demo selective attention to therapy tasks for 10 minutes in min noisy environment over two sessions    Time  3    Period  Weeks    Status  On-going      SLP SHORT TERM GOAL #2   Title  pt will demo improved awareness by double checking answers/work in therapy tasks 100% of the time over three sessions    Time  3    Period  Weeks    Status  On-going      SLP SHORT TERM GOAL #3   Title  pt will demo functional reasoning/problem solving skills in simple functional linguistic tasks in 3 therapy sessions    Time  3  Period  Weeks    Status  On-going      SLP SHORT TERM GOAL #4   Title  pt will complete full cognitive-linguistic testing    Status  Achieved      SLP SHORT TERM GOAL #5   Title  pt will improve speech volume to upper 60s - low 70s dB in 5 minutes simple conversation x3 sessions    Time  3    Period  Weeks    Status  New       SLP Long Term Goals - 05/22/19 1320      SLP LONG TERM GOAL #1   Title  pt will demonstrate WFL selective atteniton in min-mod noisy environment for 15 mintue long therapy task x3 sessions    Time  8    Period  Weeks   or 17 sessions, for all LTGs   Status  On-going      SLP LONG TERM GOAL #2   Title  pt will demo improved problem solving ability in lingustic problem solving tasks with rare min A over three sessions    Time  8    Period  Weeks    Status  On-going      SLP LONG TERM GOAL #3   Title  pt will improve speech volume to upper 60s - low 70s dB in 10 minutes simple conversation x3 sessions    Time  8    Period  Weeks    Status  On-going        Plan - 05/22/19 1638    Clinical Impression Statement  Pt presents with cognitive communicaiton deficits at least in the areas of attention, awareness (primarily safety awareness), and problem solving ID'd by pt performance on subtests of Cognitive Linguistic Quick Test (CLQT) however pt's scores scored out as WNL for attention (SLP suspects at least mild deficit), executive function (SLP suspects at least mild deficit complicated by pt's high level of impulsivity), language, and visuospatial skills. Pt tested as mild deficit in memory (likely due to pt's decr'd focused/sustained attention), and mild deficit in the clock drawing (which SLP suspects due to pt's high level of impulsivity/decr'd attention and attention to detail). SLP, based on pt's presentation in past sessions with this SLP, cautioned pt against driving. Patient stated husband drove her to therapy today, and SLP told pt that was a good idea. Pt also cont to present with dysarthria caused by decr'd breath support for speech resulting in average volume softer than WNL. Pt would benefit from skilled ST targeting cognitive commnication skills as well as improving pt volume in conversation. Pt agrees that work on Conservation officer, naturecognitive communication skills would best be done first, then work with improving pt's volume.    Speech Therapy Frequency  2x / week    Duration  --   8 weeks, or 17 total visits   Treatment/Interventions  Environmental controls;Compensatory techniques;Cueing hierarchy;Cognitive reorganization;Internal/external aids;Patient/family education;SLP instruction and feedback;Functional tasks    Potential to Achieve Goals  Good    Potential Considerations  Ability to learn/carryover information;Severity of impairments       Patient will benefit from skilled therapeutic intervention in order to improve the following deficits and impairments:   Cognitive communication deficit  Dysarthria and anarthria    Problem List Patient  Active Problem List   Diagnosis Date Noted  . Hypothyroidism   . Parkinson's disease (HCC)   . Frequent falls   . Osteoporosis     Woodward Klem ,MS, CCC-SLP  05/22/2019, 4:50 PM  [facility] [address] [place], [place], [address] Phone: [phone]   Fax:  [phone]   Name: Kelly Howard MRN: [id number] Date of Birth: 12-18-1950

## 2019-05-27 ENCOUNTER — Ambulatory Visit: Payer: Medicare Other

## 2019-05-27 ENCOUNTER — Ambulatory Visit: Payer: Medicare Other | Admitting: Physical Therapy

## 2019-05-27 ENCOUNTER — Other Ambulatory Visit: Payer: Self-pay

## 2019-05-27 DIAGNOSIS — R29818 Other symptoms and signs involving the nervous system: Secondary | ICD-10-CM

## 2019-05-27 DIAGNOSIS — R41841 Cognitive communication deficit: Secondary | ICD-10-CM

## 2019-05-27 DIAGNOSIS — R471 Dysarthria and anarthria: Secondary | ICD-10-CM

## 2019-05-27 DIAGNOSIS — R2689 Other abnormalities of gait and mobility: Secondary | ICD-10-CM | POA: Diagnosis not present

## 2019-05-27 NOTE — Therapy (Signed)
[facility] [address] [place], [place], [address] Phone: [phone]   Fax:  [phone]  Speech Language Pathology Treatment  Patient Details  Name: Kelly Howard MRN: [id number] Date of Birth: Apr 08, 1951 Referring Provider (SLP): Eldridge Abrahams, MD; pt asked for cc: to [name], DO   Encounter Date: 05/27/2019  End of Session - 05/27/19 1348    Visit Number  4    Number of Visits  17    Date for SLP Re-Evaluation  07/26/19    SLP Start Time  85    SLP Stop Time   1104    SLP Time Calculation (min)  44 min    Activity Tolerance  Other (comment)   limited by reduced attention/incr'd impulsivity      Past Medical History:  Diagnosis Date  . Frequent falls   . Hypothyroidism   . Osteoporosis   . Parkinson's disease ([place])     No past surgical history on file.  There were no vitals filed for this visit.  Subjective Assessment - 05/27/19 1028    Subjective  Pt brings in 12 bottles of meds and two ziplock sandwich bags of loose meds, and a single-cover/day pill box.    Currently in Pain?  No/denies            ADULT SLP TREATMENT - 05/27/19 1029      General Information   Behavior/Cognition  Alert;Cooperative;Impulsive;Distractible;Decreased sustained attention      Treatment Provided   Treatment provided  Cognitive-Linquistic      Cognitive-Linquistic Treatment   Treatment focused on  Cognition;Patient/family/caregiver education    Skilled Treatment  Dyskinesias noted again today. Pt attempted to call her husband in "because he's really concerned about this stuff". SLP was told by pt's PTA that pt had said she was biting PD meds in half, and could not tell PTA her meds or admin time. SLP suggested to pt that he look at meds and review with pt. Husband stated he oversees all med administration with pt, assuring pt takes the correct meds at the appropriate times. He acknowledges pt does  bite her 1/2 med and that he has encouraged pt to use pill cutter instead. SLP told pt/husband how important it is for proper dosage of PD meds; husband states his discomfort with pt's current med regimen and pt "gets so active" for about 60 minutes after a dose of PD meds. SLP informed pt husband that pt does have significant impulsivity which complicates pt's ability to attend for a sustained time. SLP told pt/husband that SLP recommends pt does not drive. Husband said he drives - pt drives "rarely" and pt appeared adamant not to relenquish this responsibility altogether. Husband told SLP he has to restrain pt from buying food they do not need when she goes shopping, as an example of impulsivity. Given pt's level of impulsivity SLP suggested we put ST on hold until after pt sees Dr. Carles Collet.      Assessment / Recommendations / Plan   Plan  Continue with current plan of care      Progression Toward Goals   Progression toward goals  Progressing toward goals       SLP Education - 05/27/19 1348    Education Details  pt should consider not driving, pt should cut pills and not bite them in half, pt will need supervision with med administration    Person(s) Educated  Patient;Spouse    Methods  Explanation  Comprehension  Verbalized understanding       SLP Short Term Goals - 05/27/19 1353      SLP SHORT TERM GOAL #1   Title  pt will demo selective attention to therapy tasks for 10 minutes in min noisy environment over two sessions    Time  2    Period  Weeks    Status  On-going      SLP SHORT TERM GOAL #2   Title  pt will demo improved awareness by double checking answers/work in therapy tasks 100% of the time over three sessions    Time  2    Period  Weeks    Status  On-going      SLP SHORT TERM GOAL #3   Title  pt will demo functional reasoning/problem solving skills in simple functional linguistic tasks in 3 therapy sessions    Time  2    Period  Weeks    Status  On-going      SLP  SHORT TERM GOAL #4   Title  pt will complete full cognitive-linguistic testing    Status  Achieved      SLP SHORT TERM GOAL #5   Title  pt will improve speech volume to upper 60s - low 70s dB in 5 minutes simple conversation x3 sessions    Time  2    Period  Weeks    Status  On-going       SLP Long Term Goals - 05/27/19 1353      SLP LONG TERM GOAL #1   Title  pt will demonstrate WFL selective atteniton in min-mod noisy environment for 15 mintue long therapy task x3 sessions    Time  6    Period  Weeks   or 17 sessions, for all LTGs   Status  On-going      SLP LONG TERM GOAL #2   Title  pt will demo improved problem solving ability in lingustic problem solving tasks with rare min A over three sessions    Time  6    Period  Weeks    Status  On-going      SLP LONG TERM GOAL #3   Title  pt will improve speech volume to upper 60s - low 70s dB in 10 minutes simple conversation x3 sessions    Time  6    Period  Weeks    Status  On-going       Plan - 05/27/19 1351    Clinical Impression Statement  Pt presents with cognitive communicaiton deficits demonstrated in the areas of attention, awareness (primarily safety awareness), and problem solving complicated by pt's high level of impulsivity. SLP, based on pt's presentation in past sessions with this SLP, cautioned pt against driving. See "skilled intervention" for details of today's session. Pt also cont to present with dysarthria caused by decr'd breath support for speech resulting in average volume softer than WNL. Pt would benefit from skilled ST targeting cognitive commnication skills as well as improving pt volume in conversation. Pt agrees that work on Conservation officer, naturecognitive communication skills would best be done first, then work with improving pt's volume.    Speech Therapy Frequency  2x / week    Duration  --   8 weeks, or 17 total visits   Treatment/Interventions  Environmental controls;Compensatory techniques;Cueing hierarchy;Cognitive  reorganization;Internal/external aids;Patient/family education;SLP instruction and feedback;Functional tasks    Potential to Achieve Goals  Good    Potential Considerations  Ability to learn/carryover information;Severity of  impairments       Patient will benefit from skilled therapeutic intervention in order to improve the following deficits and impairments:   Cognitive communication deficit  Dysarthria and anarthria    Problem List Patient Active Problem List   Diagnosis Date Noted  . Hypothyroidism   . Parkinson's disease (H[facility])   . Frequent falls   . Osteoporosis     Selestino Nila ,MS, CCC-SLP  05/27/2019, 1:54 PM  [facility] [address] [place], [place], [address] Phone: [phone]   Fax:  [phone]   Name: Kelly Howard MRN: [id number] Date of Birth: Oct 26, 1950

## 2019-05-27 NOTE — Patient Instructions (Signed)
Cut your meds in the pill cutter. Talk with Dr. Carles Collet about your medication, side effects you mentioned today, and about driving.

## 2019-05-27 NOTE — Therapy (Signed)
[facility] [address] [place], [place], [address] Phone: [phone]   Fax:  [phone]  Physical Therapy Treatment  Patient Details  Name: Kelly Howard MRN: [id number] Date of Birth: 06/27/1950 Referring Provider (PT): Siddiqui (Pt is seeing Dr. Carles Collet for first visit 05/01/2019)   Encounter Date: 05/27/2019  PT End of Session - 05/27/19 1134    Visit Number  4    Number of Visits  17    Date for PT Re-Evaluation  07/28/19    Authorization Type  [employer] Medicare; copay for PT/speech combined; separate copay for OT    PT Start Time  0950    PT Stop Time  1015    PT Time Calculation (min)  25 min    Activity Tolerance  Other (comment)   limited by anxiety and restlessness   Behavior During Therapy  Restless;Anxious;Impulsive       Past Medical History:  Diagnosis Date  . Frequent falls   . Hypothyroidism   . Osteoporosis   . Parkinson's disease ([place])     No past surgical history on file.  There were no vitals filed for this visit.  Subjective Assessment - 05/27/19 1132    Subjective  Pt denies any changes since last visit.  Arrived late for appointment.  States her husband got a phone call just as they were leaving.    Patient Stated Goals  Pt's goal for therapy is to walk without fear of falling.    Currently in Pain?  No/denies        [facility] Adult PT Treatment/Exercise - 05/27/19 0001      Self-Care   Self-Care  Other Self-Care Comments        Pt arrived late for appointment session.  Pt did bring in medications as asked to do last session.  Has multiple bottles of  Prescriptions and also some pills in a pill box along with several baggies with different medications in them.  Pt also Is bitting pills that shoud be halved instead of using pill cutter.  There was a pill cutter in pts box and showed pt that it was there. Pt reports thinking she lost it.  Discussed importance of taking her  medications and prescribed along with how it can impact her PD symptoms by not taking as prescribed.  Also discussed importance of seeing Dr Tat on 06/09/18 and needing to arrive [place] For appointment. Pt again with dyskinesias present during entire session along with repetitive movements (intentional) during session. Pt reports feeling like she wants to scratch her head constantly. Pt reports having increased stressors lately regarding her daughter who is an addict and concern over her grandchildren and their safety. Pt does see a therapist and is planning on talking with her today.   Difficult for pt to focus on any one topic for a significant length of time during session due to anxiety.  Pt asking same medication questions That we discussed on previous session.   At end of session talked with ST and pt concerning medications and ST to attempt to help pt come up with a schedule for consistency with medications.     PT Education - 05/27/19 1133    Education Details  Importance of taking medication as prescribed as well as importance of timing consistency with medications.    Person(s) Educated  Patient    Methods  Explanation    Comprehension  Verbalized understanding;Need further instruction  PT Short Term Goals - 04/29/19 1542      PT SHORT TERM GOAL #1   Title  Pt will be independent with HEP to address balance, transfers, gait.  TARGET 05/23/2019    Time  4    Period  Weeks    Status  New    Target Date  05/23/19      PT SHORT TERM GOAL #2   Title  Pt will perform sit<>stand at least 8 of 10 reps from low surfaces, simulating sofa, with no posterior lean, modified independently.    Time  4    Period  Weeks    Status  New    Target Date  05/23/19      PT SHORT TERM GOAL #3   Title  Pt will verbalize understanding of fall prevention in home environment.    Time  4    Period  Weeks    Status  New    Target Date  05/23/19        PT Long Term Goals -  04/29/19 1544      PT LONG TERM GOAL #1   Title  Pt will be independent with HEP for Parkinson's specific exercises, to address posture, transfers, balance, gait.  TARGET 06/21/2019    Time  8    Period  Weeks    Status  New    Target Date  06/21/19      PT LONG TERM GOAL #2   Title  Pt will perform TUG and TUG cognitive within 10% time difference, for improved ability for dual tasking with gait.    Time  8    Period  Weeks    Status  New    Target Date  06/21/19      PT LONG TERM GOAL #3   Title  Pt will improve DGI score to at least 20/24 for decreased fall risk.    Time  8    Period  Weeks    Status  New    Target Date  06/21/19      PT LONG TERM GOAL #4   Title  Pt will verbalize plans for ongoing community fitness upon d/c from PT.    Time  8    Period  Weeks    Target Date  06/21/19            Plan - 05/27/19 1136    Clinical Impression Statement  Pt continues to have questions regarding medications and scheduling.  Pt reports increased stressors and exhibits increased anxiety during session.  Continue PT per POC.    Personal Factors and Comorbidities  Behavior Pattern;Comorbidity 3+    Comorbidities  osteoporosis, R wrist fracture (11/2018), L wrist fracture (04/22/2019), hx of rib fracture    Examination-Activity Limitations  Stand;Stairs;Transfers;Locomotion Level    Examination-Participation Restrictions  Community Activity   Exercise   Stability/Clinical Decision Making  Evolving/Moderate complexity    Rehab Potential  Good    PT Frequency  2x / week    PT Duration  8 weeks   plus eva   PT Treatment/Interventions  ADLs/Self Care Home Management;Gait training;Stair training;Functional mobility training;Therapeutic activities;Therapeutic exercise;Balance training;Patient/family education;Neuromuscular re-education;DME Instruction    PT Next Visit Plan  Review HEP; work on hip, ankle, step strategies, posture and balance exercises; manual resistance for balance  perturbations, gait training    Consulted and Agree with Plan of Care  Patient       Patient will benefit from skilled therapeutic intervention  in order to improve the following deficits and impairments:  Abnormal gait, Decreased balance, Decreased mobility, Decreased knowledge of precautions, Decreased coordination, Difficulty walking, Decreased safety awareness, Decreased strength, Postural dysfunction  Visit Diagnosis: Other symptoms and signs involving the nervous system     Problem List Patient Active Problem List   Diagnosis Date Noted  . Hypothyroidism   . Parkinson's disease (HCC)   . Frequent falls   . Osteoporosis     Newell Coral, [place] [facility] 05/27/19 11:45 AM Phone: [phone] Fax: [phone]   [facility] [address] [place], [place], [address] Phone: [phone]   Fax:  [phone]  Name: Kelly Howard MRN: [id number] Date of Birth: 1950-09-04

## 2019-05-29 ENCOUNTER — Ambulatory Visit: Payer: Medicare Other | Admitting: Physical Therapy

## 2019-05-29 ENCOUNTER — Ambulatory Visit: Payer: Medicare Other

## 2019-06-05 NOTE — Progress Notes (Deleted)
Kelly Howard was seen today in the movement disorders clinic for neurologic consultation at the request of Philip Aspen, [place]*.  The consultation is for the evaluation of Parkinson's disease.  I have reviewed numerous records (all the way back to 2012) as well as talk to her physical therapist and her counselor Veto Kemps) about her.  Patient is a 69 year old female who was previously taking care of at [facility].  She was diagnosed in approximately 2000 02/2010 with Parkinson's disease with initial symptoms being gait change, bradykinesia.  She was initiated on Requip, but only took that for about a week in 2011 and did not feel well on it (perhaps sleepiness).  She then was started on Azilect and pramipexole.  By 2015, the patient had been treated for some obsessions with Paxil and had been treated for REM behavior disorder with clonazepam.  She was on Mirapex ER, 4.5 mg daily.  In August, 2016, she was started on levodopa.  It took her quite a while to increase this.  In fact, she did not get up to 1 tablet 3 times per day until February, 2018.  At that point in time, it was recommended that she decrease her pramipexole down to 3 mg because she was beginning to have compulsive shopping, illusions and hallucinations as well as weight gain.  By June, 2019, mild dyskinesia was noted.  They did continue to decrease her pramipexole because of impulsivity with the medication (shopping per patient) and by November, 2019 she was down to 1.5 mg daily.  She was last seen at [facility] on November 02, 2018.  At that point in time, each of her levodopa dosages was increased by an extra half tablet.  Pt states that she has not done that - "I'm awful.  My husband thinks its like speed."  They also discussed neurocognitive testing.  Current RX movement disorder medications: Carbidopa/levodopa 25/100, 1.5 tablet at 6 AM/9 AM/noon/3 PM/6 PM (but pt states that she is taking 1 tablet with each  dose) Carbidopa/levodopa 25/100 CR at bedtime Pramipexole ER 0.75 mg daily Sertraline 50 mg daily Clonazepam 0.5 mg, half a tablet twice per day Azilect, 1 mg daily   Specific Symptoms:  Tremor: No., not usually Family hx of similar:  Yes.   (distant) Sleep:   Acting out dreams:  Yes.   Postural symptoms:  Yes.    Falls?  No., doesn't use a walker but has had multiple falls because "I don't need one."  PT estimates about 10 falls in the year, including a fall down the stairs but thinks that was a syncopal episode.  She will fall backwards at times. Urinary Incontinence:  No., but frequency Trouble with ADL's:  No.  Trouble buttoning clothing: No. Depression:  Yes but mostly "anxious" Memory changes:  Yes.   but "I'm not awful." Hallucinations:  Yes.  , frequently - will see "mice or bigger." N/V:  No. Lightheaded:  {yes no:314532}  Syncope: {yes no:314532} Diplopia:  {yes no:314532} Dyskinesia:  {yes no:314532} Prior exposure to reglan/antipsychotics: {yes no:314532}  Neuroimaging of the brain has *** previously been performed.  It *** available for my review today.  PREVIOUS MEDICATIONS: {Parkinson's RX:18200}  ALLERGIES:   Allergies  Allergen Reactions  . Codeine Other (See Comments)    GI Upset  . Escitalopram Swelling    Foot swelling  . Propoxyphene Other (See Comments)    Hallucinations    CURRENT MEDICATIONS:  Current Outpatient Medications  Medication Instructions  .  carbidopa-levodopa (SINEMET IR) 25-100 MG tablet Oral, 1.5 tablet every 3 hrs  . clonazePAM (KLONOPIN) 0.5 MG tablet TAKE 1/2 TABLET BY MOUTH TWICE DAILY  . Fish Oil-Cholecalciferol (OMEGA-3 + VITAMIN D3) 1110-300 MG-UNIT CAPS 1 tablet, Oral, Daily  . ibandronate (BONIVA) 150 MG tablet No dose, route, or frequency recorded.  Marland Kitchen ibuprofen (ADVIL) 800 MG tablet ibuprofen 800 mg tablet  . levothyroxine (SYNTHROID, LEVOTHROID) 50 MCG tablet Oral  . Melatonin 9 mg, Oral, Daily at bedtime, Take three  3 mg tablets at bedtime   . Pramipexole Dihydrochloride 0.75 MG TB24 Oral  . rasagiline (AZILECT) 1 mg, Oral  . sertraline (ZOLOFT) 50 MG tablet sertraline 50 mg tablet    VITALS:   Vitals:   06/10/19 1241  BP: 114/67  Pulse: 80  Temp: 98.1 F (36.7 C)  SpO2: 98%  Weight: 129 lb 12.8 oz (58.9 kg)  Height: 5\' 6"  (1.676 m)    GEN:  The patient appears stated age and is in NAD. HEENT:  Normocephalic, atraumatic.  The mucous membranes are moist. The superficial temporal arteries are without ropiness or tenderness. CV:  RRR Lungs:  CTAB Neck/HEME:  There are no carotid bruits bilaterally.  Neurological examination:  Orientation: The patient is alert and oriented x3. She has trouble staying focused Cranial nerves: There is good facial symmetry. Extraocular muscles are intact. The visual fields are full to confrontational testing. The speech is fluent and clear. Soft palate rises symmetrically and there is no tongue deviation. Hearing is intact to conversational tone. Sensation: Sensation is intact to light and pinprick throughout (facial, trunk, extremities). Vibration is intact at the bilateral big toe. There is no extinction with double simultaneous stimulation. There is no sensory dermatomal level identified. Motor: Strength is 5/5 in the bilateral upper and lower extremities.   Shoulder shrug is equal and symmetric.  There is no pronator drift. Deep tendon reflexes: Deep tendon reflexes are 2+/4 at the bilateral biceps, triceps, brachioradialis, patella and achilles. Plantar responses are downgoing bilaterally.  Movement examination: Tone: There is normal tone in the UE/LE Abnormal movements: she has mod axial dyskinesia Coordination:  There is no decremation with RAM's, with any form of RAMS, including alternating supination and pronation of the forearm, hand opening and closing, finger taps, heel taps and toe taps bilaterally Gait and Station: The patient has no difficulty arising  out of a deep-seated chair without the use of the hands. The patient's stride length is good but knees are not bent when walking (doesn't bend knees with ambulation).  She drags the left foot, esp L toes.  The R hand is dyskinetic with ambulation.    ASSESSMENT/PLAN:  ***1.  Parkinsons Disease, dx 2010/2011  -disease has been complicated by falls/dyskinesia/compulsive behavior (shopping)  -would recommend tapering the pramipexole ER given compulsive behavior and hallucination  Total time spent on today's visit was ***greater than 60 minutes, including both face-to-face time and nonface-to-face time.  Time included that spent on review of records (prior notes available to me/labs/imaging if pertinent), discussing treatment and goals, answering patient's questions and coordinating care.  Cc:  [name], Rayford Halsted, MD

## 2019-06-06 DIAGNOSIS — G20 Parkinson's disease: Secondary | ICD-10-CM

## 2019-06-10 ENCOUNTER — Encounter: Payer: Self-pay | Admitting: Neurology

## 2019-06-10 ENCOUNTER — Other Ambulatory Visit: Payer: Self-pay

## 2019-06-10 ENCOUNTER — Ambulatory Visit: Payer: Medicare PPO | Admitting: Neurology

## 2019-06-10 ENCOUNTER — Ambulatory Visit (INDEPENDENT_AMBULATORY_CARE_PROVIDER_SITE_OTHER): Payer: Medicare PPO | Admitting: Clinical

## 2019-06-10 VITALS — BP 114/67 | HR 80 | Temp 98.1°F | Ht 66.0 in | Wt 129.8 lb

## 2019-06-10 DIAGNOSIS — G903 Multi-system degeneration of the autonomic nervous system: Secondary | ICD-10-CM | POA: Diagnosis not present

## 2019-06-10 DIAGNOSIS — R413 Other amnesia: Secondary | ICD-10-CM

## 2019-06-10 DIAGNOSIS — G249 Dystonia, unspecified: Secondary | ICD-10-CM

## 2019-06-10 DIAGNOSIS — T887XXA Unspecified adverse effect of drug or medicament, initial encounter: Secondary | ICD-10-CM

## 2019-06-10 DIAGNOSIS — G20 Parkinson's disease: Secondary | ICD-10-CM | POA: Diagnosis not present

## 2019-06-10 DIAGNOSIS — F411 Generalized anxiety disorder: Secondary | ICD-10-CM

## 2019-06-10 DIAGNOSIS — G20B1 Parkinson's disease with dyskinesia, without mention of fluctuations: Secondary | ICD-10-CM

## 2019-06-10 DIAGNOSIS — G20A1 Parkinson's disease without dyskinesia, without mention of fluctuations: Secondary | ICD-10-CM

## 2019-06-10 DIAGNOSIS — Z719 Counseling, unspecified: Secondary | ICD-10-CM

## 2019-06-10 MED ORDER — AMBULATORY NON FORMULARY MEDICATION: 0 refills | Status: DC

## 2019-06-10 NOTE — BH Specialist Note (Signed)
Referring Provider: Kerin Salen, DO Date of Referral: 06/10/2019 Primary Reason for Referral: New to office  Location of Visit: pt and spouse, office visit  Suicide/Homicide Risk: Pt denies risk   Assessment Patient presents today for psychoeducation with LCSW following transfer of Parkinson's Care by referring provider Dr. Lurena Joiner Tat. LCSW provided patient education on nonmotor Parkinson's symptoms such as apathy, depression, incontinence/constipation, sleep behavior disorders, communication and cognitive impairment. LCSW provided supportive counseling as pt and husband endorses mood and cognitive changes since being diagnosed with Parkinson's 10 years ago. LCSW notes pending referral to neuropsychology for further evaluation.  LCSW provided pt with information about our support and educational groups for patients with Parkinson's as well as their care partners, also discussed the importance of forced intense exercise in the management of PD and provided information about exercise opportunities.  Pt responded receptively to patient education today. Pt strengths includes family support and engagement in community Parkinson's supports.  Pt would likely benefit from engagement with additional community supports and consideration for individual counseling  to address the adjustment of living with chronic disease of Parkinson's.   Brief Interventions provided today in session 1. psychoeducation, patient education 2. Supportive counseling   Plan 1. Pt invitation to attend [employer] Parkinson's meetings, husband invitation to attend PD care partner group  2. Goal for exercise-discussed various online options, pt opts for cycling and boxing, LCSW will coordinate  Behavioral Health treatment recommendations communicated to referring provider and pt states agreement with plan. LCSW will remain available for future consultation.

## 2019-06-10 NOTE — Progress Notes (Signed)
Kelly Howard was seen today in the movement disorders clinic for neurologic consultation at the request of [name], Est[place]*.  Husband is present and supplements the history.  The consultation is for the evaluation of Parkinson's disease.  I have reviewed numerous records (all the way back to 2012) as well as talk to her physical therapist and her counselor Dennison Nancy) about her.  Patient is a 69 year old female who was previously taking care of at [facility].  She was diagnosed in approximately 2000 02/2010 with Parkinson's disease with initial symptoms being gait change, bradykinesia.  She was initiated on Requip, but only took that for about a week in 2011 and did not feel well on it (perhaps sleepiness).  She then was started on Azilect and pramipexole.  By 2015, the patient had been treated for some obsessions with Paxil and had been treated for REM behavior disorder with clonazepam.  She was on Mirapex ER, 4.5 mg daily.  In August, 2016, she was started on levodopa.  It took her quite a while to increase this.  In fact, she did not get up to 1 tablet 3 times per day until February, 2018.  At that point in time, it was recommended that she decrease her pramipexole down to 3 mg because she was beginning to have compulsive shopping, illusions and hallucinations as well as weight gain.  By June, 2019, mild dyskinesia was noted.  They did continue to decrease her pramipexole because of impulsivity with the medication (shopping per patient) and by November, 2019 she was down to 1.5 mg daily.  She was last seen at [facility] on November 02, 2018.  At that point in time, each of her levodopa dosages was increased by an extra half tablet.  Pt states that she has not done that - "I'm awful.  My husband thinks its like speed."  They also discussed neurocognitive testing.  This has not yet been completed.   Current RX movement disorder medications: Carbidopa/levodopa 25/100, 1.5 tablet at 6 AM/9 AM/noon/3  PM/6 PM (but pt states that she is taking 1 tablet with each dose) Carbidopa/levodopa 25/100 CR at bedtime Pramipexole ER 0.75 mg daily Sertraline 50 mg daily Clonazepam 0.5 mg, half a tablet twice per day Azilect, 1 mg daily   Specific Symptoms:  Tremor: No., not usually Family hx of similar:  Yes.   (distant) Sleep:   Acting out dreams:  Yes.   Postural symptoms:  Yes.    Falls?  No., doesn't use a walker but has had multiple falls because "I don't need one."  PT estimates about 10 falls in the year, including a fall down the stairs but thinks that was a syncopal episode.  Husband states that it was a fall down the stairs that resulted in her hitting her head and passing out.  She will fall backwards at times. Urinary Incontinence:  No., but frequency Trouble with ADL's:  No.  Trouble buttoning clothing: No. Depression:  Yes but mostly "anxious" Memory changes:  Yes.   but "I'm not awful." Hallucinations:  Yes.  , frequently - will see "mice or bigger." N/V:  No. Dyskinesia:  Yes.   Prior exposure to reglan/antipsychotics: No.    ALLERGIES:   Allergies  Allergen Reactions  . Codeine Other (See Comments)    GI Upset  . Escitalopram Swelling    Foot swelling  . Propoxyphene Other (See Comments)    Hallucinations    CURRENT MEDICATIONS:  Current Outpatient Medications  Medication Instructions  . AMBULATORY NON FORMULARY MEDICATION U step walkerDx: G20  . carbidopa-levodopa (SINEMET IR) 25-100 MG tablet Oral, 1.5 tablet every 3 hrs  . clonazePAM (KLONOPIN) 0.5 MG tablet TAKE 1/2 TABLET BY MOUTH TWICE DAILY  . Fish Oil-Cholecalciferol (OMEGA-3 + VITAMIN D3) 1110-300 MG-UNIT CAPS 1 tablet, Oral, Daily  . ibandronate (BONIVA) 150 MG tablet No dose, route, or frequency recorded.  Marland Kitchen ibuprofen (ADVIL) 800 MG tablet ibuprofen 800 mg tablet  . levothyroxine (SYNTHROID, LEVOTHROID) 50 MCG tablet Oral  . Melatonin 9 mg, Oral, Daily at bedtime, Take three 3 mg tablets at bedtime     . Pramipexole Dihydrochloride 0.75 MG TB24 Oral  . rasagiline (AZILECT) 1 mg, Oral  . sertraline (ZOLOFT) 50 MG tablet sertraline 50 mg tablet    VITALS:   Vitals:   06/10/19 1241  BP: 114/67  Pulse: 80  Temp: 98.1 F (36.7 C)  SpO2: 98%  Weight: 129 lb 12.8 oz (58.9 kg)  Height: '5\' 6"'  (1.676 m)    GEN:  The patient appears stated age and is in NAD. HEENT:  Normocephalic, atraumatic.  The mucous membranes are moist. The superficial temporal arteries are without ropiness or tenderness. CV:  RRR Lungs:  CTAB Neck/HEME:  There are no carotid bruits bilaterally.  Neurological examination:  Orientation: The patient is alert and oriented x3. She has trouble staying focused Cranial nerves: There is good facial symmetry. Extraocular muscles are intact. The visual fields are full to confrontational testing. The speech is fluent and clear. Soft palate rises symmetrically and there is no tongue deviation. Hearing is intact to conversational tone. Sensation: Sensation is intact to light and pinprick throughout (facial, trunk, extremities). Vibration is intact at the bilateral ankle. There is no extinction with double simultaneous stimulation. There is no sensory dermatomal level identified. Motor: Strength is 5/5 in the bilateral upper and lower extremities.   Shoulder shrug is equal and symmetric.  There is no pronator drift. Deep tendon reflexes: Deep tendon reflexes are 2+/4 at the bilateral biceps, triceps, brachioradialis, patella and achilles. Plantar responses are downgoing bilaterally.  Movement examination: Tone: There is normal tone in the UE/LE Abnormal movements: she has mod axial dyskinesia Coordination:  There is no decremation with RAM's, with any form of RAMS, including alternating supination and pronation of the forearm, hand opening and closing, finger taps, heel taps and toe taps bilaterally Gait and Station: The patient has no difficulty arising out of a deep-seated chair  without the use of the hands. The patient's stride length is good but knees are not bent when walking (doesn't bend knees with ambulation).  She drags the left foot, esp L toes.  The R hand is dyskinetic with ambulation.    ASSESSMENT/PLAN:  1.  Parkinsons Disease, dx 2010/2011  -disease has been complicated by falls/dyskinesia/compulsive behavior (shopping)  -would recommend tapering the pramipexole ER given compulsive behavior and hallucinations  -Dr. Tonye Royalty could previously tried to increase her levodopa, but the patient has not yet done that.  I will go ahead and do that today given that I am decreasing her pramipexole.  She will take carbidopa/levodopa 25/100, 1.5 tablet at 6 AM/9 AM/noon/3 PM/6 PM.  She was told to make sure she follows the of schedule.  She does better when she does follow up schedule.  -She will continue carbidopa/levodopa 25/100 CR at bedtime  -For now, she will continue the rasagiline.  I may discontinue that in the future because of confusion.  -neuropsych testing will  be completed.  -Patient's husband asks about the patient's inability to focus and what he describes as the patient "being on speed."  Husband was thinking that this was a side effect of levodopa.  I really do not think so.  I will be getting her off of the pramipexole, but I just wonder if there is not something else going on here.  I agree that she does have difficulty staying focused.  Once we get her medications worked around, we may need to get psychiatry on board as well as part of a multidisciplinary team.  -She met my social worker today.  -Encouraged her to get involved with exercise.  Information was given.  -Prescription was given for a U step walker.  She was told to use a walker at all times.  2.  GAD  -on klonopin 0. 35m bid  3.  Neurogenic Orthostatic Hypotension  -husband states that this has been an issue in past.  Will monitor.  Was okay today.  Discussed hydration.  Total time spent on  today's visit was 70 minutes, including both face-to-face time and nonface-to-face time.  Time included that spent on review of records (prior notes available to me/labs/imaging if pertinent), discussing treatment and goals, answering patient's questions and coordinating care.  This did not include the 95 min on 06/05/18 doing record review documented above  Cc:  HIsaac Bliss ERayford Halsted MD

## 2019-06-10 NOTE — Patient Instructions (Addendum)
1.  Take your pramipexole ER that you have (0.75 mg) and split it (I know its an ER) and take 1/2 tablet for 1 week and then stop the medication 2.  Take Carbidopa/levodopa 25/100, 1.5 tablet at 6 AM/9 AM/noon/3 PM/6 PM 3.  Carbidopa/levodopa 25/100 CR at bedtime 4.  Use a walker!  RX for the U-step is given today. 5.  Exercise!  You have been referred for a neurocognitive evaluation in our office.   The evaluation has two parts.   . The first part of the evaluation is a clinical interview with the neuropsychologist (Dr. Milbert Coulter or Dr. Roseanne Reno). Please bring someone with you to this appointment if possible, as it is helpful for the doctor to hear from both you and another adult who knows you well.   . The second part of the evaluation is testing with the doctor's technician Annabelle Harman or Selena Batten). The testing includes a variety of tasks- mostly question-and-answer, some paper-and-pencil. There is nothing you need to do to prepare for this appointment, but having a good night's sleep prior to the testing, taking medications as you normally would, and bringing eyeglasses and hearing aids (if you wear them), is advised. Please make sure that you wear a mask to the appointment.  Please note: We have to reserve several hours of the neuropsychologist's time and the psychometrician's time for your evaluation appointment. As such, please note that there is a No-Show fee of $100. If you are unable to attend any of your appointments, please contact our office as soon as possible to reschedule.   The physicians and staff at [facility] are committed to providing excellent care. You may receive a survey requesting feedback about your experience at our office. We strive to receive "very good" responses to the survey questions. If you feel that your experience would prevent you from giving the office a "very good " response, please contact our office to try to remedy the situation. We may be reached at [phone].  Thank you for taking the time out of your busy day to complete the survey.

## 2019-06-12 ENCOUNTER — Telehealth: Payer: Self-pay | Admitting: *Deleted

## 2019-06-12 NOTE — Telephone Encounter (Signed)
Pt state the referral needs to be dates for 05/31/2119

## 2019-06-12 NOTE — Telephone Encounter (Signed)
Is this PT? If so, ok with me

## 2019-06-12 NOTE — Telephone Encounter (Signed)
Okay to place referral

## 2019-06-12 NOTE — Telephone Encounter (Signed)
Copied from CRM [id number]. Topic: General - Other >> Jun 12, 2019 11:32 AM [name] wrote: Reason for CRM: Pt called and is needing a referral to continue seeing her therapist Kelly Howard. Pt states she is not able to see her without it due to her insurance. Please advise.

## 2019-06-13 ENCOUNTER — Telehealth: Payer: Self-pay | Admitting: Neurology

## 2019-06-13 NOTE — Telephone Encounter (Signed)
Pt called back no answer voice mail left for her to call back  

## 2019-06-13 NOTE — Telephone Encounter (Signed)
Pt left message on the VM that states she needs to talk to someone about her medication

## 2019-06-14 ENCOUNTER — Telehealth: Payer: Self-pay | Admitting: Neurology

## 2019-06-14 ENCOUNTER — Telehealth (INDEPENDENT_AMBULATORY_CARE_PROVIDER_SITE_OTHER): Payer: Medicare PPO | Admitting: Internal Medicine

## 2019-06-14 ENCOUNTER — Other Ambulatory Visit: Payer: Self-pay

## 2019-06-14 ENCOUNTER — Ambulatory Visit (INDEPENDENT_AMBULATORY_CARE_PROVIDER_SITE_OTHER): Payer: Medicare PPO

## 2019-06-14 DIAGNOSIS — M25552 Pain in left hip: Secondary | ICD-10-CM | POA: Diagnosis not present

## 2019-06-14 DIAGNOSIS — W19XXXA Unspecified fall, initial encounter: Secondary | ICD-10-CM | POA: Diagnosis not present

## 2019-06-14 NOTE — Telephone Encounter (Signed)
Pt called no answer voice mail left for her to call back. Sertraline 50mg  tablet is still on her medication list it is the last one on her AVS on the last page.

## 2019-06-14 NOTE — Progress Notes (Signed)
Virtual Visit via Video Note  I connected with Kelly Howard on 06/14/19 at  3:00 PM EST by a video enabled telemedicine application and verified that I am speaking with the correct person using two identifiers.  Location patient: home Location provider: work office Persons participating in the virtual visit: patient, provider, husband  I discussed the limitations of evaluation and management by telemedicine and the availability of in person appointments. The patient expressed understanding and agreed to proceed.   HPI: On New Year's Eve she fell down the stairs at home hitting the left side of her hip and leg.  She decided not to pursue medical attention to avoid the hospital during the COVID-19 pandemic.  She has only had minor pain and no difficulty walking but she has had extensive bruising from her hip all the way down to her knee.  She has now developed a "lump" on her hip.  She is not on a blood thinner.  She would like to know what to do next.   ROS: Constitutional: Denies fever, chills, diaphoresis, appetite change and fatigue.  HEENT: Denies photophobia, eye pain, redness, hearing loss, ear pain, congestion, sore throat, rhinorrhea, sneezing, mouth sores, trouble swallowing, neck pain, neck stiffness and tinnitus.   Respiratory: Denies SOB, DOE, cough, chest tightness,  and wheezing.   Cardiovascular: Denies chest pain, palpitations and leg swelling.  Gastrointestinal: Denies nausea, vomiting, abdominal pain, diarrhea, constipation, blood in stool and abdominal distention.  Genitourinary: Denies dysuria, urgency, frequency, hematuria, flank pain and difficulty urinating.  Endocrine: Denies: hot or cold intolerance, sweats, changes in hair or nails, polyuria, polydipsia. Musculoskeletal: Denies myalgias, back pain, joint swelling, arthralgias and gait problem.  Skin: Denies pallor, rash and wound.  Neurological: Denies dizziness, seizures, syncope, weakness,  light-headedness, numbness and headaches.  Hematological: Denies adenopathy. Easy bruising, personal or family bleeding history  Psychiatric/Behavioral: Denies suicidal ideation, mood changes, confusion, nervousness, sleep disturbance and agitation   Past Medical History:  Diagnosis Date  . Frequent falls   . Hypothyroidism   . Osteoporosis   . Parkinson's disease [facility])     Past Surgical History:  Procedure Laterality Date  . TONSILLECTOMY    . WRIST SURGERY      Family History  Problem Relation Age of Onset  . Hypercalcemia Mother   . Cancer Paternal Grandmother   . Diabetes Paternal Grandfather     SOCIAL HX:   reports that she has never smoked. She has never used smokeless tobacco. She reports current alcohol use. She reports that she does not use drugs.   Current Outpatient Medications:  .  AMBULATORY NON FORMULARY MEDICATION, U step walker  Dx: G20, Disp: 1 Device, Rfl: 0 .  carbidopa-levodopa (SINEMET IR) 25-100 MG tablet, Take by mouth. 1.5 tablet every 3 hrs, Disp: , Rfl:  .  clonazePAM (KLONOPIN) 0.5 MG tablet, TAKE 1/2 TABLET BY MOUTH TWICE DAILY, Disp: , Rfl:  .  Fish Oil-Cholecalciferol (OMEGA-3 + VITAMIN D3) 1110-300 MG-UNIT CAPS, Take 1 tablet by mouth daily., Disp: , Rfl:  .  ibandronate (BONIVA) 150 MG tablet, , Disp: , Rfl:  .  ibuprofen (ADVIL) 800 MG tablet, ibuprofen 800 mg tablet, Disp: , Rfl:  .  levothyroxine (SYNTHROID, LEVOTHROID) 50 MCG tablet, Take by mouth., Disp: , Rfl:  .  Melatonin 3 MG TABS, Take 9 mg by mouth at bedtime. Take three 3 mg tablets at bedtime, Disp: , Rfl:  .  Pramipexole Dihydrochloride 0.75 MG TB24, Take by mouth. ,  Disp: , Rfl:  .  rasagiline (AZILECT) 1 MG TABS tablet, Take 1 mg by mouth., Disp: , Rfl:  .  sertraline (ZOLOFT) 50 MG tablet, sertraline 50 mg tablet, Disp: , Rfl:   EXAM:   VITALS per patient if applicable: None reported  GENERAL: alert, oriented, appears well and in no acute distress  HEENT: atraumatic,  conjunttiva clear, no obvious abnormalities on inspection of external nose and ears  NECK: normal movements of the head and neck  LUNGS: on inspection no signs of respiratory distress, breathing rate appears normal, no obvious gross increased work of breathing, gasping or wheezing  CV: no obvious cyanosis  MS: Significant bruising from her hip all the way down to her knee that is in different stages of evolution.  She does have what appears to be a hematoma on her  lateral left hip at about the height of her gluteal fold.  PSYCH/NEURO: pleasant and cooperative, no obvious depression or anxiety, speech and thought processing grossly intact  ASSESSMENT AND PLAN:   Hip pain, left  Fall, initial encounter  -She will come in office today for hip x-rays with further plan to follow.  If no fracture then just need time to let hematoma reabsorb.     I discussed the assessment and treatment plan with the patient. The patient was provided an opportunity to ask questions and all were answered. The patient agreed with the plan and demonstrated an understanding of the instructions.   The patient was advised to call back or seek an in-person evaluation if the symptoms worsen or if the condition fails to improve as anticipated.    Lelon Frohlich, MD  [facility]

## 2019-06-14 NOTE — Addendum Note (Signed)
Addended by: Philemon Kingdom on: 06/14/2019 03:30 PM   Modules accepted: Orders

## 2019-06-14 NOTE — Telephone Encounter (Signed)
Patient called regarding a medication Sertraline. She did not see it on her AVS. She wanted to make sure she is to still take it? Please Call. Thank you

## 2019-06-17 ENCOUNTER — Telehealth: Payer: Self-pay | Admitting: Neurology

## 2019-06-17 MED ORDER — CARBIDOPA-LEVODOPA 25-100 MG PO TABS: 1.5000 | ORAL_TABLET | Freq: Every day | ORAL | 0 refills | Status: DC

## 2019-06-17 MED ORDER — RASAGILINE MESYLATE 1 MG PO TABS: 1.0000 mg | ORAL_TABLET | Freq: Every day | ORAL | 2 refills | Status: DC

## 2019-06-17 MED ORDER — CLONAZEPAM 0.5 MG PO TABS: 0.5000 mg | ORAL_TABLET | Freq: Every day | ORAL | 0 refills | Status: DC

## 2019-06-17 NOTE — Telephone Encounter (Signed)
No medication listed has been filled by Dr Tat previously sending to Dr Tat for approval

## 2019-06-17 NOTE — Telephone Encounter (Signed)
1. Patient called and said her pharmacy did not have her prescription for rasagiline 1 MG.   2. She also needs refills on clonazepam .5 MG.   3. Patient wants to confirm dosages of carbidopa-levodopa with a clinical staff member.  [facility] [address]

## 2019-06-17 NOTE — Telephone Encounter (Signed)
Kelly Howard, find out from pt/husband if she has the old AVS.  The dosages are written on there under instructions.  If not, please review with her based on my last note.  You can RF the azilect (rasagiline).  Re: klonopin.  It's my understanding that she is using this for GAD?  I generally don't treat this or use this medication in the daytime (I do at night).  I will fill the nighttime dose if they want but don't feel comfortable with her taking it in the day due to increased confusion and potential for falls.  I sent the klonopin, 1/2 po q hs to her pharmacy

## 2019-06-17 NOTE — Telephone Encounter (Signed)
Patient is aware and so is her husband. She is having a hard time determining what medications are for GAD and ones for Dr Tat. I went over with her multiple times and she said she was only seeing Dr Tat right now, I advised she call her PCP that is listed and discuss this all with her that Dr Tat can not start prescribing her GAD medications. She stated that she takes .5 tab of Klonopin when nervous and I told her that is not how it is being prescribed by Dr Tat to reach out to her PCP. Stated she understood

## 2019-06-18 ENCOUNTER — Telehealth: Payer: Self-pay | Admitting: Neurology

## 2019-06-18 NOTE — Telephone Encounter (Signed)
Patient is on IR so that is not extended release. Spoke with patient and her husband in regards to this. They stated that she was on extended in the past with a previous doctor. I advised that Dr Tat has prescribed it this way and that I can ask her if she would like to see about adding / changing anything if they would like and they declined and stated they are happy to try what they have been prescribed.

## 2019-06-18 NOTE — Telephone Encounter (Addendum)
Patient called and left a message that she has questions about her medication carbidopa-levodopa 25-100 MG and the dosage. She'd like to know if it is extended release.   Marland Kitchen

## 2019-06-24 ENCOUNTER — Ambulatory Visit: Payer: Medicare PPO | Attending: Obstetrics and Gynecology | Admitting: Physical Therapy

## 2019-06-24 ENCOUNTER — Other Ambulatory Visit: Payer: Self-pay

## 2019-06-24 ENCOUNTER — Ambulatory Visit: Payer: Medicare PPO

## 2019-06-24 DIAGNOSIS — R2689 Other abnormalities of gait and mobility: Secondary | ICD-10-CM | POA: Diagnosis present

## 2019-06-24 DIAGNOSIS — R471 Dysarthria and anarthria: Secondary | ICD-10-CM

## 2019-06-24 DIAGNOSIS — R2681 Unsteadiness on feet: Secondary | ICD-10-CM

## 2019-06-24 DIAGNOSIS — R29818 Other symptoms and signs involving the nervous system: Secondary | ICD-10-CM

## 2019-06-24 DIAGNOSIS — R293 Abnormal posture: Secondary | ICD-10-CM

## 2019-06-24 DIAGNOSIS — R41841 Cognitive communication deficit: Secondary | ICD-10-CM | POA: Diagnosis present

## 2019-06-24 NOTE — Patient Instructions (Signed)
In regards to your medication questions:  1)Contact Dr. Arbutus Leas and ask about the Carbidopa/Levodopa CR 25/100 at bedtime (this is listed on your after visit summary to take; however, it is not on your current medication list)  2)Check into the possibility of Divvydose (for medication accuracy)  -[email or web address]  [phone]

## 2019-06-24 NOTE — Therapy (Signed)
[facility] [address] [place], [place], [address] Phone: [phone]   Fax:  [phone]  Physical Therapy Treatment  Patient Details  Name: Kelly Howard MRN: [id number] Date of Birth: Nov 17, 1950 Referring Provider (PT): Siddiqui (Pt is seeing Dr. Carles Collet for first visit 05/01/2019)   Encounter Date: 06/24/2019  PT End of Session - 06/24/19 1008    Visit Number  5    Number of Visits  17    Date for PT Re-Evaluation  06/07/30   per recert 3/55/7322   Authorization Type  [place] request completed 06/24/2019    PT Start Time  0720    PT Stop Time  0802    PT Time Calculation (min)  42 min    Activity Tolerance  Patient tolerated treatment well    Behavior During Therapy  Restless       Past Medical History:  Diagnosis Date  . Frequent falls   . Hypothyroidism   . Osteoporosis   . Parkinson's disease [facility])     Past Surgical History:  Procedure Laterality Date  . TONSILLECTOMY    . WRIST SURGERY      There were no vitals filed for this visit.  Subjective Assessment - 06/24/19 0804    Subjective  Pt has several questions about her medications since seeing Dr. Carles Collet.  She reports having no additional falls since New Year's Eve (had to have x-ray on hip, but no fracture noted).    Patient Stated Goals  Pt's goal for therapy is to walk without fear of falling.    Currently in Pain?  No/denies                       [facility] Adult PT Treatment/Exercise - 06/24/19 0740      Transfers   Transfers  Sit to Stand;Stand to Sit    Sit to Stand  5: Supervision;Without upper extremity assist;From chair/3-in-1    Five time sit to stand comments   9.4    Stand to Sit  5: Supervision;Without upper extremity assist;To bed;To chair/3-in-1    Comments  Decreased posterior lean, decreased uncontrolled descent, compared to eval      Ambulation/Gait   Ambulation/Gait  Yes    Ambulation/Gait  Assistance  5: Supervision    Ambulation Distance (Feet)  230 Feet    Assistive device  None    Gait Pattern  Step-through pattern;Decreased arm swing - right;Decreased arm swing - left;Decreased dorsiflexion - right;Decreased dorsiflexion - left;Poor foot clearance - left;Poor foot clearance - right    Ambulation Surface  Level;Indoor    Gait velocity  9 sec = 3.64 ft/sec    Stairs  Yes    Stairs Assistance  5: Supervision    Stair Management Technique  One rail Right;Alternating pattern;Forwards    Number of Stairs  4    Height of Stairs  6      Standardized Balance Assessment   Standardized Balance Assessment  Timed Up and Go Test;Dynamic Gait Index      Dynamic Gait Index   Level Surface  Normal    Change in Gait Speed  Normal    Gait with Horizontal Head Turns  Mild Impairment    Gait with Vertical Head Turns  Mild Impairment    Gait and Pivot Turn  Normal    Step Over Obstacle  Normal    Step Around Obstacles  Mild Impairment    Steps  Mild  Impairment    Total Score  20      Timed Up and Go Test   TUG  Normal TUG;Cognitive TUG    Normal TUG (seconds)  9.25    Cognitive TUG (seconds)  11.44      High Level Balance   High Level Balance Comments  MiniBESTest score performed this visit:  23/28 (see full note for details)      Self-Care   Self-Care  Other Self-Care Comments    Other Self-Care Comments   Discussed pt's recent visit with Dr. Carles Collet, as well as fall that pt had around [place] (that is the only fall she's had since last PT session, but it was on the stairs).  Pt continues to have questions regarding her medication, specifically ?extended release).  PT printed after visit summary from Dr.Tat's visit, where she mentions Carbidopa/Levodopa CR at bedtime and pt reprots this is no longer on med list.  She is concerned because she is beginning to wake up with dystonia in her foot.  PT encouraged pt to contact Dr. Doristine Devoid office to clarify.  She reports having decreased  hallucinations and impulsivity since stopping Pramipexole (noted decreased distractability in PT session today).  In addition, PT discussed and provided information on how to make sure she is taking PD meds on time, every time (gave info on Divvydose) and emphasized importance of PD meds on time, everytime.  Discussed PT POC, that she has made some improvements in measures since eval, that she seems to have improved body awareness, improved safety awareness.  Given her hx of falls and not currently exercising, pt is agreeable to continueing PT to reinforce balance strategies for reduction in falls and establish appropriate, PD-specific HEP.         Mini-BESTest: Balance Evaluation Systems Test  2005-2013 [employer]. All rights reserved. ________________________________________________________________________________________Anticipatory_________Subscore___6__/6 1. SIT TO STAND Instruction: "Cross your arms across your chest. Try not to use your hands unless you must.Do not let your legs lean against the back of the chair when you stand. Please stand up now." X(2) Normal: Comes to stand without use of hands and stabilizes independently. (1) Moderate: Comes to stand WITH use of hands on first attempt. (0) Severe: Unable to stand up from chair without assistance, OR needs several attempts with use of hands. 2. RISE TO TOES Instruction: "Place your feet shoulder width apart. Place your hands on your hips. Try to rise as high as you can onto your toes. I will count out loud to 3 seconds. Try to hold this pose for at least 3 seconds. Look straight ahead. Rise now." X(2) Normal: Stable for 3 s with maximum height. (1) Moderate: Heels up, but not full range (smaller than when holding hands), OR noticeable instability for 3 s. (0) Severe: < 3 s. 3. STAND ON ONE LEG Instruction: "Look straight ahead. Keep your hands on your hips. Lift your leg off of the ground behind you without  touching or resting your raised leg upon your other standing leg. Stay standing on one leg as long as you can. Look straight ahead. Lift now." Left: Time in Seconds Trial 1:__20___Trial 2:_____ X(2) Normal: 20 s. (1) Moderate: < 20 s. (0) Severe: Unable. Right: Time in Seconds Trial 1:__20___Trial 2:_____ X(2) Normal: 20 s. (1) Moderate: < 20 s. (0) Severe: Unable To score each side separately use the trial with the longest time. To calculate the sub-score and total score use the side [left or right] with  the lowest numerical score [i.e. the worse side]. ______________________________________________________________________________________Reactive Postural Control___________Subscore:___3__/6 4. COMPENSATORY STEPPING CORRECTION- FORWARD Instruction: "Stand with your feet shoulder width apart, arms at your sides. Lean forward against my hands beyond your forward limits. When I let go, do whatever is necessary, including taking a step, to avoid a fall." (2) Normal: Recovers independently with a single, large step (second realignment step is allowed). X(1) Moderate: More than one step used to recover equilibrium. (0) Severe: No step, OR would fall if not caught, OR falls spontaneously. 5. COMPENSATORY STEPPING CORRECTION- BACKWARD Instruction: "Stand with your feet shoulder width apart, arms at your sides. Lean backward against my hands beyond your backward limits. When I let go, do whatever is necessary, including taking a step, to avoid a fall." (2) Normal: Recovers independently with a single, large step. X(1) Moderate: More than one step used to recover equilibrium. (0) Severe: No step, OR would fall if not caught, OR falls spontaneously. 6. COMPENSATORY STEPPING CORRECTION- LATERAL Instruction: "Stand with your feet together, arms down at your sides. Lean into my hand beyond your sideways limit. When I let go, do whatever is necessary, including taking a step, to avoid a  fall." Left (2) Normal: Recovers independently with 1 step (crossover or lateral OK). X(1) Moderate: Several steps to recover equilibrium. (0) Severe: Falls, or cannot step. Right (2) Normal: Recovers independently with 1 step (crossover or lateral OK). X(1) Moderate: Several steps to recover equilibrium. (0) Severe: Falls, or cannot step. Use the side with the lowest score to calculate sub-score and total score. ____________________________________________________________________________________Sensory Orientation_____________Subscore:________6_/6 7. STANCE (FEET TOGETHER); EYES OPEN, FIRM SURFACE Instruction: "Place your hands on your hips. Place your feet together until almost touching. Look straight ahead. Be as stable and still as possible, until I say stop." Time in seconds:________ X(2) Normal: 30 s. (1) Moderate: < 30 s. (0) Severe: Unable. 8. STANCE (FEET TOGETHER); EYES CLOSED, FOAM SURFACE Instruction: "Step onto the foam. Place your hands on your hips. Place your feet together until almost touching. Be as stable and still as possible, until I say stop. I will start timing when you close your eyes." Time in seconds:________ X(2) Normal: 30 s. (1) Moderate: < 30 s. (0) Severe: Unable. 9. INCLINE- EYES CLOSED Instruction: "Step onto the incline ramp. Please stand on the incline ramp with your toes toward the top. Place your feet shoulder width apart and have your arms down at your sides. I will start timing when you close your eyes." Time in seconds:________ X(2) Normal: Stands independently 30 s and aligns with gravity. (1) Moderate: Stands independently <30 s OR aligns with surface. (0) Severe: Unable. _________________________________________________________________________________________Dynamic Gait ______Subscore_____8___/10 10. CHANGE IN GAIT SPEED Instruction: "Begin walking at your normal speed, when I tell you 'fast', walk as fast as you can. When I say 'slow',  walk very slowly." X(2) Normal: Significantly changes walking speed without imbalance. (1) Moderate: Unable to change walking speed or signs of imbalance. (0) Severe: Unable to achieve significant change in walking speed AND signs of imbalance. [place] - HORIZONTAL Instruction: "Begin walking at your normal speed, when I say "right", turn your head and look to the right. When I say "left" turn your head and look to the left. Try to keep yourself walking in a straight line." (2) Normal: performs head turns with no change in gait speed and good balance. X(1) Moderate: performs head turns with reduction in gait speed. (0) Severe: performs head turns with imbalance. 12. WALK  WITH PIVOT TURNS Instruction: "Begin walking at your normal speed. When I tell you to 'turn and stop', turn as quickly as you can, face the opposite direction, and stop. After the turn, your feet should be close together." X(2) Normal: Turns with feet close FAST (< 3 steps) with good balance. (1) Moderate: Turns with feet close SLOW (>4 steps) with good balance. (0) Severe: Cannot turn with feet close at any speed without imbalance. 13. STEP OVER OBSTACLES Instruction: "Begin walking at your normal speed. When you get to the box, step over it, not around it and keep walking." X(2) Normal: Able to step over box with minimal change of gait speed and with good balance. (1) Moderate: Steps over box but touches box OR displays cautious behavior by slowing gait. (0) Severe: Unable to step over box OR steps around box. 14. TIMED UP & GO WITH DUAL TASK [3 METER WALK] Instruction TUG: "When I say 'Go', stand up from chair, walk at your normal speed across the tape on the floor, turn around, and come back to sit in the chair." Instruction TUG with Dual Task: "Count backwards by threes starting at ___. When I say 'Go', stand up from chair, walk at your normal speed across the tape on the floor, turn around, and come  back to sit in the chair. Continue counting backwards the entire time." TUG: ________seconds; Dual Task TUG: ________seconds (2) Normal: No noticeable change in sitting, standing or walking while backward counting when compared to TUG without Dual Task. X(1) Moderate: Dual Task affects either counting OR walking (>10%) when compared to the TUG without Dual Task. (0) Severe: Stops counting while walking OR stops walking while counting. When scoring item 14, if subject's gait speed slows more than 10% between the TUG without and with a Dual Task the score should be decreased by a point. TOTAL SCORE: ___23_____/28      PT Education - 06/24/19 1007    Education Details  PT POC, progress towards goals; PD meds on time/everytime, check with Dr. Carles Collet about question about the CR PD medication    Person(s) Educated  Patient    Methods  Explanation;Handout    Comprehension  Verbalized understanding       PT Short Term Goals - 06/24/19 1024      PT SHORT TERM GOAL #1   Title  Pt will be independent with HEP to address balance, transfers, gait.  TARGET 05/23/2019    Time  4    Period  Weeks    Status  Not Met    Target Date  05/23/19      PT SHORT TERM GOAL #2   Title  Pt will perform sit<>stand at least 8 of 10 reps from low surfaces, simulating sofa, with no posterior lean, modified independently.    Time  4    Period  Weeks    Status  Not Met    Target Date  05/23/19      PT SHORT TERM GOAL #3   Title  Pt will verbalize understanding of fall prevention in home environment.    Time  4    Period  Weeks    Status  Not Met    Target Date  05/23/19        PT Long Term Goals - 06/24/19 1027      PT LONG TERM GOAL #1   Title  Pt will be independent with HEP for Parkinson's specific exercises, to address posture, transfers, balance, gait.  TARGET 06/21/2019    Time  8    Period  Weeks    Status  On-going      PT LONG TERM GOAL #2   Title  Pt will perform TUG and TUG cognitive  within 10% time difference, for improved ability for dual tasking with gait.    Time  8    Period  Weeks    Status  On-going      PT LONG TERM GOAL #3   Title  Pt will improve DGI score to at least 20/24 for decreased fall risk.    Time  8    Period  Weeks    Status  Achieved      PT LONG TERM GOAL #4   Title  Pt will verbalize plans for ongoing community fitness upon d/c from PT.    Time  8    Period  Weeks    Status  On-going            Plan - 06/24/19 1028    Clinical Impression Statement  Pt arrives for first visit since 05/27/2019, awaiting pt's first visit with Dr. Carles Collet to address medication/PD symptoms concerns.  Pt appears to be less anxious and less distractable during PT session today; however, continue to note increased dystonia type movement patterns in UE with gait activities.  She has improved DGI score to 20/24; she continues to demonstrate difficulty with dual tasking on TUG and TUG cognitive scores; MiniBESTest score is 23/28, with pt with increased steps for step strategy on push and release test.  Her initial STGs and LTGs have not really been met, as pt's initial sessions in December were specifically addressing pt safety and education.  Pt will benefit from skilled PT to address transfers, balance, and dynamic gait to reduce fall risk and improve functional mobility.    Personal Factors and Comorbidities  Behavior Pattern;Comorbidity 3+    Comorbidities  osteoporosis, R wrist fracture (11/2018), L wrist fracture (04/22/2019), hx of rib fracture    Examination-Activity Limitations  Stand;Stairs;Transfers;Locomotion Level    Examination-Participation Restrictions  Community Activity   Exercise   Stability/Clinical Decision Making  Evolving/Moderate complexity    Rehab Potential  Good    PT Frequency  2x / week    PT Duration  6 weeks   per recert 3/84/6659   PT Treatment/Interventions  ADLs/Self Care Home Management;Gait training;Stair training;Functional mobility  training;Therapeutic activities;Therapeutic exercise;Balance training;Patient/family education;Neuromuscular re-education;DME Instruction    PT Next Visit Plan  Try U-Step RW (per Dr. Carles Collet writing an order for pt for this); review and update HEP-to address hip, ankle, step strategies, posture, high level balance, PWR! Moves; provide information on virtual PD cycling class through [employer] and Agree with Plan of Care  Patient       Patient will benefit from skilled therapeutic intervention in order to improve the following deficits and impairments:  Abnormal gait, Decreased balance, Decreased mobility, Decreased knowledge of precautions, Decreased coordination, Difficulty walking, Decreased safety awareness, Decreased strength, Postural dysfunction  Visit Diagnosis: Other abnormalities of gait and mobility  Unsteadiness on feet  Abnormal posture  Other symptoms and signs involving the nervous system     Problem List Patient Active Problem List   Diagnosis Date Noted  . Hypothyroidism   . Parkinson's disease ([place])   . Frequent falls   . Osteoporosis     Brittin Belnap W. 06/24/2019, 10:41 AM  Frazier Butt., PT  [facility] [phone]  [place], [place], [address] Phone: [phone]   Fax:  [phone]  Name: Kelly Howard MRN: [id number] Date of Birth: [birth year]  For recert, updated goals: PT Short Term Goals - 06/24/19 1044      PT SHORT TERM GOAL #1   Title  Pt will be independent with HEP to address balance, transfers, gait.  TARGET 07/05/2019    Time  2    Period  Weeks    Status  On-going    Target Date  07/05/19      PT SHORT TERM GOAL #2   Title  Pt will perform sit<>stand at least 8 of 10 reps from low surfaces, simulating sofa, with no posterior lean, modified independently.    Time  2    Period  Weeks    Status  On-going    Target Date  07/05/19      PT SHORT TERM GOAL #3    Title  Pt will verbalize understanding of fall prevention in home environment.    Time  2    Period  Weeks    Status  On-going    Target Date  07/05/19      PT Long Term Goals - 06/24/19 1045      PT LONG TERM GOAL #1   Title  Pt will be independent with HEP for Parkinson's specific exercises, to address posture, transfers, balance, gait.  TARGET 08/02/2019    Time  6    Period  Weeks    Status  On-going      PT LONG TERM GOAL #2   Title  Pt will perform TUG and TUG cognitive within 10% time difference, for improved ability for dual tasking with gait.    Time  6    Period  Weeks    Status  On-going      PT LONG TERM GOAL #3   Title  Pt will improve MiniBESTest score to at least 25/28 for improved balance, decreased fall risk.    Time  6    Period  Weeks    Status  Revised      PT LONG TERM GOAL #4   Title  Pt will verbalize plans for ongoing community fitness upon d/c from PT.    Time  6    Period  Weeks    Status  On-going      Mady Haagensen, [place] 06/24/19 10:47 AM Phone: [phone] Fax: [phone]

## 2019-06-24 NOTE — Therapy (Signed)
[facility] [address] [place], [place], [address] Phone: [phone]   Fax:  [phone]  Speech Language Pathology Treatment  Patient Details  Name: Kelly Howard MRN: [id number] Date of Birth: 04-25-1951 Referring Provider (SLP): Eldridge Abrahams, MD; pt asked for cc: to [name], DO   Encounter Date: 06/24/2019  End of Session - 06/24/19 0932    Visit Number  5    Number of Visits  17    Date for SLP Re-Evaluation  07/26/19    SLP Start Time  0804    SLP Stop Time   0846    SLP Time Calculation (min)  42 min    Activity Tolerance  Patient tolerated treatment well       Past Medical History:  Diagnosis Date  . Frequent falls   . Hypothyroidism   . Osteoporosis   . Parkinson's disease [facility])     Past Surgical History:  Procedure Laterality Date  . TONSILLECTOMY    . WRIST SURGERY      There were no vitals filed for this visit.  Subjective Assessment - 06/24/19 0819    Subjective  "I'm not very good in math." (when SLP told pt we were working on functional math for attention)    Currently in Pain?  No/denies            ADULT SLP TREATMENT - 06/24/19 0823      General Information   Behavior/Cognition  Alert;Cooperative;Impulsive;Distractible;Decreased sustained attention      Treatment Provided   Treatment provided  Cognitive-Linquistic      Cognitive-Linquistic Treatment   Treatment focused on  Cognition    Skilled Treatment  Pt does not look as anxious and fidgety as previous sessions. SLP worked with pt's attention/processing today in functional math (coin and bill equivalents). Pt with 90% success, with consistent extra time. Pt asking why she has difficulty concentrating on books. SLP educated pt on nature of inattention and this could be the reason she has to re-read portions of a book over again for comprehension. SLP offered suggestions for compensation. Pt asking why she  has difficulty cooking/baking now. SLP educated pt that her concentation and impulsivity likely made baking/cooking very challenging. Pt gave eample of flat banana bread - SLP suggested pt likely forgot to add ingredient/skipped ingredients. Pt homework to cook/bake something in presence of husband with the modifications pt generated (crossing off ingredients as she progresses through the recipe) and SPL suggested today (placement of ingredients, and crossing off steps to recipe), and to read with the modfications suggested today.        Assessment / Recommendations / Plan   Plan  Continue with current plan of care      Progression Toward Goals   Progression toward goals  Progressing toward goals       SLP Education - 06/24/19 0932    Education Details  compensations for cooking/baking, and for reading for decr'd attention    Person(s) Educated  Patient    Methods  Explanation;Handout    Comprehension  Verbalized understanding;Need further instruction       SLP Short Term Goals - 06/24/19 0813      SLP SHORT TERM GOAL #1   Title  pt will demo selective attention to therapy tasks for 10 minutes in min noisy environment over two sessions    Baseline  06-24-19    Time  1    Period  Weeks    Status  On-going      SLP SHORT TERM GOAL #2   Title  pt will demo improved awareness by double checking answers/work in therapy tasks 100% of the time over three sessions    Time  1    Period  Weeks    Status  On-going      SLP SHORT TERM GOAL #3   Title  pt will demo functional reasoning/problem solving skills in simple functional linguistic tasks in 3 therapy sessions    Baseline  06-24-19    Time  1    Period  Weeks    Status  On-going      SLP SHORT TERM GOAL #4   Title  pt will complete full cognitive-linguistic testing    Status  Achieved      SLP SHORT TERM GOAL #5   Title  pt will improve speech volume to upper 60s - low 70s dB in 5 minutes simple conversation x3 sessions    Time   1    Period  Weeks    Status  Deferred       SLP Long Term Goals - 06/24/19 0934      SLP LONG TERM GOAL #1   Title  pt will demonstrate WFL selective atteniton in min-mod noisy environment for 15 mintue long therapy task x3 sessions    Time  5    Period  Weeks   or 17 sessions, for all LTGs   Status  On-going      SLP LONG TERM GOAL #2   Title  pt will demo improved problem solving ability in lingustic problem solving tasks with rare min A over three sessions    Time  5    Period  Weeks    Status  On-going      SLP LONG TERM GOAL #3   Title  pt will improve speech volume to upper 60s - low 70s dB in 10 minutes simple conversation x3 sessions    Time  5    Period  Weeks    Status  On-going       Plan - 06/24/19 0932    Clinical Impression Statement  Pt presents with cognitive communicaiton deficits demonstrated in the areas of attention, awareness (primarily safety awareness), and problem solving complicated by pt's high level of impulsivity. however, pt appears less impulsive and fidgety today than in previous sessions. See "skilled intervention" for details. Pt also cont to present with dysarthria caused by decr'd breath support for speech resulting in average volume softer than WNL. Pt would benefit from skilled ST targeting cognitive commnication skills as well as improving pt volume in conversation. Pt agrees that work on Conservation officer, nature would best be done first, then work with improving pt's volume.    Speech Therapy Frequency  2x / week    Duration  --   8 weeks, or 17 total visits   Treatment/Interventions  Environmental controls;Compensatory techniques;Cueing hierarchy;Cognitive reorganization;Internal/external aids;Patient/family education;SLP instruction and feedback;Functional tasks    Potential to Achieve Goals  Good    Potential Considerations  Ability to learn/carryover information;Severity of impairments       Patient will benefit from skilled  therapeutic intervention in order to improve the following deficits and impairments:   Cognitive communication deficit  Dysarthria and anarthria    Problem List Patient Active Problem List   Diagnosis Date Noted  . Hypothyroidism   . Parkinson's disease (HCC)   . Frequent falls   . Osteoporosis  [facility] ,MS, CCC-SLP  06/24/2019, 9:35 AM  [facility] [address] [place], [place], [address] Phone: [phone]   Fax:  [phone]   Name: Kelly Howard MRN: [id number] Date of Birth: 1950-06-13

## 2019-06-24 NOTE — Patient Instructions (Signed)
  HOMEWORK:  Bake something simple with the modifications we talked aobut:  copying the recipe   Unused ingredients on right, used ingredients on left (or another counter)  crossing off what you've added and steps you've completed  Read a novel you've enjoyed:  Read out loud to help your focus  Loraine Leriche your book with a highlighter

## 2019-06-25 ENCOUNTER — Encounter: Payer: Self-pay | Admitting: Physical Therapy

## 2019-06-26 ENCOUNTER — Telehealth: Payer: Self-pay | Admitting: Internal Medicine

## 2019-06-26 NOTE — Telephone Encounter (Signed)
Pt has surgery (on her broken wrist) Monday 07/01/19. Pt would like someone to contact her in regards to if she should still take her medication that day and if she needs to report her surgeries to [facility]. Pt can be contacted at [phone]

## 2019-06-26 NOTE — Telephone Encounter (Signed)
By me ok to take meds, but ask her to run it by her surgeon first.

## 2019-06-26 NOTE — Telephone Encounter (Signed)
Patient is aware 

## 2019-06-28 ENCOUNTER — Encounter: Payer: Self-pay | Admitting: Physical Therapy

## 2019-06-28 ENCOUNTER — Other Ambulatory Visit: Payer: Self-pay

## 2019-06-28 ENCOUNTER — Ambulatory Visit: Payer: Medicare PPO | Admitting: Physical Therapy

## 2019-06-28 ENCOUNTER — Ambulatory Visit: Payer: Medicare PPO

## 2019-06-28 ENCOUNTER — Telehealth: Payer: Self-pay

## 2019-06-28 DIAGNOSIS — R471 Dysarthria and anarthria: Secondary | ICD-10-CM

## 2019-06-28 DIAGNOSIS — R2689 Other abnormalities of gait and mobility: Secondary | ICD-10-CM

## 2019-06-28 DIAGNOSIS — R2681 Unsteadiness on feet: Secondary | ICD-10-CM

## 2019-06-28 DIAGNOSIS — R41841 Cognitive communication deficit: Secondary | ICD-10-CM

## 2019-06-28 MED ORDER — CARBIDOPA-LEVODOPA ER 25-100 MG PO TBCR: 1.0000 | EXTENDED_RELEASE_TABLET | Freq: Every day | ORAL | 0 refills | Status: DC

## 2019-06-28 NOTE — Telephone Encounter (Signed)
Patient is stating that she does not have any that are extended release, and per her last visit it stated she would take an ER at bedtimes. Patient has been taking her IR 1.5 mg at 6am, 9am, 12pm, 3pm, 6pm, 9pm. Per last visit she was not taking IR at 9pm. Please advise  Patient also has a surgery Monday and wants to know if from your point of view she needs to hold any medication

## 2019-06-28 NOTE — Telephone Encounter (Signed)
Patient came into the office to discuss her Carbidopa. Went over with her again that it is not extended release. Per pharmacy she has not been on extended release in over a year. She had a prescription filled on 12/28 for the same dose and instructions that Dr Tat sent for her on 1/18 which she picked up as well. Both had been 90 day supplies. Patient is very confused and she would not allow me to speak to husband at this time. Went over this with patient multiple times she states that she understands. Per chart note she can still take CR at bedtime, this has not been prescribed here and per pharmacy in over a year. Patient will return call once she is home.

## 2019-06-28 NOTE — Therapy (Signed)
[facility] [address] [place], [place], [address] Phone: [phone]   Fax:  [phone]  Physical Therapy Treatment  Patient Details  Name: Kelly Howard MRN: [id number] Date of Birth: 06/17/1950 Referring Provider (PT): Siddiqui (Pt is seeing Dr. Carles Collet for first visit 05/01/2019)   Encounter Date: 06/28/2019  PT End of Session - 06/28/19 1705    Visit Number  6    Number of Visits  17    Date for PT Re-Evaluation  73/22/02   per recert 5/42/7062   Authorization Type  [place] request completed 06/24/2019    PT Start Time  0724   Pt arrives late   PT Stop Time  0805    PT Time Calculation (min)  41 min    Activity Tolerance  Patient tolerated treatment well    Behavior During Therapy  Restless       Past Medical History:  Diagnosis Date  . Frequent falls   . Hypothyroidism   . Osteoporosis   . Parkinson's disease [facility])     Past Surgical History:  Procedure Laterality Date  . TONSILLECTOMY    . WRIST SURGERY      There were no vitals filed for this visit.  Subjective Assessment - 06/28/19 0727    Subjective  Pt is to have surgery on her L wrist on Monday, 07/01/2019.  No additional falls.  Have cancelled PT visits until 07/16/2019 until I hear from the doctor about return to PT after the surgery.    Patient Stated Goals  Pt's goal for therapy is to walk without fear of falling.    Currently in Pain?  Yes    Pain Score  5     Pain Location  Wrist    Pain Orientation  Right;Left    Pain Descriptors / Indicators  Aching    Pain Type  Acute pain    Pain Onset  1 to 4 weeks ago    Pain Frequency  Intermittent    Aggravating Factors   worse at night, first waking up    Pain Relieving Factors  gets better through day, ibuprofen                       [facility] Adult PT Treatment/Exercise - 06/28/19 0731      Ambulation/Gait   Ambulation/Gait  Yes    Ambulation/Gait  Assistance  5: Supervision    Ambulation/Gait Assistance Details  Trial of U-Step Walker today during PT session.  Improved gait overall with use of rollator, improved step length, heelstrike, and slowed pace of gait.  Pt has two episodes of R foot catching base of rollator, improved with cues to keep feet inside BOS of U-step    Ambulation Distance (Feet)  400 Feet   additional 200 ft, 100 ft   Assistive device  Other (Comment)   U-Step Rollator   Gait Pattern  Step-through pattern;Decreased arm swing - right;Decreased arm swing - left;Decreased dorsiflexion - right;Decreased dorsiflexion - left;Poor foot clearance - left;Poor foot clearance - right    Ambulation Surface  Level;Indoor    Pre-Gait Activities  Following gait, pt reports feeling this gives her more control and steadiness with gait, "if I will use it".  Provided patient with information on how to contact [employer] with further questions if she plans to obtain.    Gait Comments  Additional bouts of gait with U-step walker includes turns, changes of directions around  furniture, with PT only giving initial cues to keep hold of handle to maneuver walker, let go of handle to brake.  Figure-8 obstacle negotiation x 2 reps, forward/back walking with U-step rollator, cues to fully place heels on floor, versus walking backwards on toes.  Gait forward/back with quick start/stops using U-step walker.  Provided supervision throughout.      Self-Care   Self-Care  Other Self-Care Comments    Other Self-Care Comments   Pt having upcoming surgery on L wrist due to previous wrist fracture.  She has cancelled PT appts through 07/16/2019 and PT advised pt to discuss with MD when she can return to PT, that she will need resume PT orders, and that she will need to make sure to get from surgeon any WB or activity restrictions.             PT Education - 06/28/19 1704    Education Details  UStep walker training and information; questions to  f/u with Dr. Mina Marble following surgery and return to PT; info on virtual PD cycling (pt had requested last visit)    Person(s) Educated  Patient    Methods  Explanation;Handout;Demonstration    Comprehension  Verbalized understanding;Returned demonstration       PT Short Term Goals - 06/24/19 1044      PT SHORT TERM GOAL #1   Title  Pt will be independent with HEP to address balance, transfers, gait.  TARGET 07/05/2019    Time  2    Period  Weeks    Status  On-going    Target Date  07/05/19      PT SHORT TERM GOAL #2   Title  Pt will perform sit<>stand at least 8 of 10 reps from low surfaces, simulating sofa, with no posterior lean, modified independently.    Time  2    Period  Weeks    Status  On-going    Target Date  07/05/19      PT SHORT TERM GOAL #3   Title  Pt will verbalize understanding of fall prevention in home environment.    Time  2    Period  Weeks    Status  On-going    Target Date  07/05/19        PT Long Term Goals - 06/24/19 1045      PT LONG TERM GOAL #1   Title  Pt will be independent with HEP for Parkinson's specific exercises, to address posture, transfers, balance, gait.  TARGET 08/02/2019    Time  6    Period  Weeks    Status  On-going      PT LONG TERM GOAL #2   Title  Pt will perform TUG and TUG cognitive within 10% time difference, for improved ability for dual tasking with gait.    Time  6    Period  Weeks    Status  On-going      PT LONG TERM GOAL #3   Title  Pt will improve MiniBESTest score to at least 25/28 for improved balance, decreased fall risk.    Time  6    Period  Weeks    Status  Revised      PT LONG TERM GOAL #4   Title  Pt will verbalize plans for ongoing community fitness upon d/c from PT.    Time  6    Period  Weeks    Status  On-going  Plan - 06/28/19 1706    Clinical Impression Statement  Focus of today's skilled PT session was education/training in UStep walker, which was discussed/recommended at  Dr. Don Perking visit several weeks ago.  With use of Ustep walker, pt demonstrates improved slowed pacing, improved control and coordination with gait, improved foot clearance and consistent step length.  Pt also demonstrates improved safety with turns and with changes of directions.  She is pleased with how Malka So helps during PT session, but reports, "if I used it."  Provided information for patient to f/u on Malka So; however pt is to have surger 07/01/2019 on L wrist, and she may have restrictions/precautions on L wrist which may limit her use of a wlaker at this time.  Pt plans to return to PT week of 07/16/2019.    Personal Factors and Comorbidities  Behavior Pattern;Comorbidity 3+    Comorbidities  osteoporosis, R wrist fracture (11/2018), L wrist fracture (04/22/2019), hx of rib fracture    Examination-Activity Limitations  Stand;Stairs;Transfers;Locomotion Level    Examination-Participation Restrictions  Community Activity   Exercise   Stability/Clinical Decision Making  Evolving/Moderate complexity    Rehab Potential  Good    PT Frequency  2x / week    PT Duration  6 weeks   per recert 06/24/2019   PT Treatment/Interventions  ADLs/Self Care Home Management;Gait training;Stair training;Functional mobility training;Therapeutic activities;Therapeutic exercise;Balance training;Patient/family education;Neuromuscular re-education;DME Instruction    PT Next Visit Plan  Pt to return week of 2/16:  pt needs clearance and restrictions (if any) from Dr. Mina Marble when she returns to PT following L wrist surgery.  Needs to review and update HEP-to address hip, ankle, step strategies, posture, high level balance, PWR! Moves for balance recovery; gait training with Malka So as able.    Consulted and Agree with Plan of Care  Patient       Patient will benefit from skilled therapeutic intervention in order to improve the following deficits and impairments:  Abnormal gait, Decreased balance, Decreased  mobility, Decreased knowledge of precautions, Decreased coordination, Difficulty walking, Decreased safety awareness, Decreased strength, Postural dysfunction  Visit Diagnosis: Other abnormalities of gait and mobility  Unsteadiness on feet     Problem List Patient Active Problem List   Diagnosis Date Noted  . Hypothyroidism   . Parkinson's disease (HCC)   . Frequent falls   . Osteoporosis     Kelly Howard W. 06/28/2019, 5:13 PM  Gean Maidens., PT   [facility] [address] [place], [place], [address] Phone: [phone]   Fax:  [phone]  Name: Kelly Howard MRN: [id number] Date of Birth: 01-17-1951

## 2019-06-28 NOTE — Telephone Encounter (Signed)
Her meds from me and EXACTLY how to take them are on the AVS.  Please print out and go over with her/husband.  May need to call pharmacy and see if she has the bedtime carbidopa/levodopa 25/100 CR.  If not call that in.  No med changes from me in regards to surgery.  Should take her Parkinsons Disease meds day of surgery.  Avoid phenergan for nausea and use zofran instead.

## 2019-06-28 NOTE — Patient Instructions (Signed)
Zoom Parkinson's Cycling Registration Register in advance for this meeting: [email or web address]    After registering, you will receive a confirmation email containing information about joining the meeting. The passcode is [id number]   U-Step Rolling walker  [email or web address] [phone]   For your physician who is doing your surgery: (Dr. Mina Marble): -Need to ask if you will have any restrictions or precautions for your wrist after surgery -Dr. Mina Marble will need to write an order clearing you to return to PT after your surgery

## 2019-06-28 NOTE — Telephone Encounter (Signed)
Spoke with wife and husband, both verify understanding. Sent CR to pharmacy

## 2019-06-28 NOTE — Therapy (Signed)
[facility] [address] [place], [place], [address] Phone: [phone]   Fax:  [phone]  Speech Language Pathology Treatment  Patient Details  Name: Kelly Howard MRN: [id number] Date of Birth: 1951/01/13 Referring Provider (SLP): Eldridge Abrahams, MD; pt asked for cc: to [name], DO   Encounter Date: 06/28/2019  End of Session - 06/28/19 0920    Visit Number  6    Number of Visits  17    Date for SLP Re-Evaluation  07/26/19    SLP Start Time  0803    SLP Stop Time   0845    SLP Time Calculation (min)  42 min    Activity Tolerance  Patient tolerated treatment well       Past Medical History:  Diagnosis Date  . Frequent falls   . Hypothyroidism   . Osteoporosis   . Parkinson's disease [facility])     Past Surgical History:  Procedure Laterality Date  . TONSILLECTOMY    . WRIST SURGERY      There were no vitals filed for this visit.  Subjective Assessment - 06/28/19 0812    Subjective  "I don't know why it was easier for me (to read)." SLP told pt that improvement likely due to incr in attention.    Currently in Pain?  Yes    Pain Score  4     Pain Location  Wrist    Pain Orientation  Right    Pain Descriptors / Indicators  Aching    Pain Type  Acute pain            ADULT SLP TREATMENT - 06/28/19 0814      General Information   Behavior/Cognition  Alert;Cooperative;Decreased sustained attention      Treatment Provided   Treatment provided  Cognitive-Linquistic      Cognitive-Linquistic Treatment   Treatment focused on  Cognition    Skilled Treatment  Pt asked SLP about neuropsych testing "I don't want to feel like an idiot." SLP encourraged pt that she will undergo approx 3 ohurs ot testing for informative reasons in order to help her - not a pass/fail. SLP reassessed pt cognition using Cognitive Linguistic Quick Test (CLQT) as pt med changes have positively affected her cognition.  SLP initiated this assessment and will finish next session.       Assessment / Recommendations / Plan   Plan  Continue with current plan of care      Progression Toward Goals   Progression toward goals  Progressing toward goals       SLP Education - 06/28/19 0920    Education Details  neuropsych testing basics    Person(s) Educated  Patient    Methods  Explanation    Comprehension  Verbalized understanding       SLP Short Term Goals - 06/28/19 0922      SLP SHORT TERM GOAL #1   Title  pt will demo selective attention to therapy tasks for 10 minutes in min noisy environment over two sessions    Baseline  06-24-19, 06-28-19    Status  Achieved      SLP SHORT TERM GOAL #2   Title  pt will demo improved awareness by double checking answers/work in therapy tasks 100% of the time over three sessions    Status  Partially Met      SLP SHORT TERM GOAL #3   Title  pt will demo functional reasoning/problem solving skills in simple  functional linguistic tasks in 3 therapy sessions    Baseline  06-24-19    Status  Partially Met      SLP SHORT TERM GOAL #4   Title  pt will complete full cognitive-linguistic testing    Status  Achieved      SLP SHORT TERM GOAL #5   Title  pt will improve speech volume to upper 60s - low 70s dB in 5 minutes simple conversation x3 sessions    Time  1    Period  Weeks    Status  Deferred       SLP Long Term Goals - 06/28/19 7858      SLP LONG TERM GOAL #1   Title  pt will demonstrate WFL selective atteniton in min-mod noisy environment for 15 mintue long therapy task x3 sessions    Time  5    Period  Weeks   or 17 sessions, for all LTGs   Status  On-going      SLP LONG TERM GOAL #2   Title  pt will demo improved problem solving ability in lingustic problem solving tasks with rare min A over three sessions    Time  5    Period  Weeks    Status  On-going      SLP LONG TERM GOAL #3   Title  pt will improve speech volume to upper 60s - low 70s dB  in 10 minutes simple conversation x3 sessions    Time  5    Period  Weeks    Status  On-going       Plan - 06/28/19 0920    Clinical Impression Statement  Pt presents with cognitive communicaiton deficits demonstrated in the areas of attention, awareness (primarily safety awareness), and problem solving complicated by pt's high level of impulsivity. SLP retested pt today using Cognitive Lingustic Quick Test (CLQT) and this will cont next session. Pt again appears less impulsive and fidgety today than in previous sessions. See "skilled intervention" for details. Pt also cont to present with dysarthria caused by decr'd breath support for speech resulting in average volume softer than WNL. Pt would benefit from skilled ST targeting cognitive commnication skills as well as improving pt volume in conversation. Pt agrees that work on Immunologist would best be done first, then work with improving pt's volume.    Speech Therapy Frequency  2x / week    Duration  --   8 weeks, or 17 total visits   Treatment/Interventions  Environmental controls;Compensatory techniques;Cueing hierarchy;Cognitive reorganization;Internal/external aids;Patient/family education;SLP instruction and feedback;Functional tasks    Potential to Achieve Goals  Good    Potential Considerations  Ability to learn/carryover information;Severity of impairments       Patient will benefit from skilled therapeutic intervention in order to improve the following deficits and impairments:   Cognitive communication deficit  Dysarthria and anarthria    Problem List Patient Active Problem List   Diagnosis Date Noted  . Hypothyroidism   . Parkinson's disease ([place])   . Frequent falls   . Osteoporosis     , ,MS, CCC-SLP  06/28/2019, 9:24 AM  [facility] [address] [place], [place], [address] Phone: [phone]   Fax:   [phone]   Name: Kelly Howard MRN: [id number] Date of Birth: 12-Nov-1950

## 2019-07-04 ENCOUNTER — Ambulatory Visit: Payer: Medicare PPO | Admitting: Physical Therapy

## 2019-07-05 ENCOUNTER — Ambulatory Visit: Payer: Medicare PPO

## 2019-07-05 ENCOUNTER — Ambulatory Visit: Payer: Medicare PPO | Admitting: Physical Therapy

## 2019-07-09 ENCOUNTER — Other Ambulatory Visit: Payer: Self-pay

## 2019-07-10 ENCOUNTER — Encounter: Payer: Self-pay | Admitting: Internal Medicine

## 2019-07-10 ENCOUNTER — Ambulatory Visit (INDEPENDENT_AMBULATORY_CARE_PROVIDER_SITE_OTHER): Payer: Medicare PPO | Admitting: Internal Medicine

## 2019-07-10 ENCOUNTER — Other Ambulatory Visit: Payer: Self-pay | Admitting: Internal Medicine

## 2019-07-10 VITALS — BP 110/80 | HR 72 | Temp 97.7°F | Ht 66.5 in | Wt 131.4 lb

## 2019-07-10 DIAGNOSIS — R296 Repeated falls: Secondary | ICD-10-CM | POA: Diagnosis not present

## 2019-07-10 DIAGNOSIS — M810 Age-related osteoporosis without current pathological fracture: Secondary | ICD-10-CM | POA: Diagnosis not present

## 2019-07-10 DIAGNOSIS — Z0000 Encounter for general adult medical examination without abnormal findings: Secondary | ICD-10-CM | POA: Diagnosis not present

## 2019-07-10 DIAGNOSIS — Z23 Encounter for immunization: Secondary | ICD-10-CM | POA: Diagnosis not present

## 2019-07-10 DIAGNOSIS — G20 Parkinson's disease: Secondary | ICD-10-CM | POA: Diagnosis not present

## 2019-07-10 DIAGNOSIS — E559 Vitamin D deficiency, unspecified: Secondary | ICD-10-CM

## 2019-07-10 DIAGNOSIS — E538 Deficiency of other specified B group vitamins: Secondary | ICD-10-CM | POA: Insufficient documentation

## 2019-07-10 DIAGNOSIS — E039 Hypothyroidism, unspecified: Secondary | ICD-10-CM

## 2019-07-10 LAB — CBC WITH DIFFERENTIAL/PLATELET
Basophils Absolute: 0.1 10*3/uL (ref 0.0–0.1)
Basophils Relative: 0.7 % (ref 0.0–3.0)
Eosinophils Absolute: 0.1 10*3/uL (ref 0.0–0.7)
Eosinophils Relative: 1.5 % (ref 0.0–5.0)
HCT: 32.6 % — ABNORMAL LOW (ref 36.0–46.0)
Hemoglobin: 11 g/dL — ABNORMAL LOW (ref 12.0–15.0)
Lymphocytes Relative: 14.4 % (ref 12.0–46.0)
Lymphs Abs: 1 10*3/uL (ref 0.7–4.0)
MCHC: 33.7 g/dL (ref 30.0–36.0)
MCV: 85.8 fl (ref 78.0–100.0)
Monocytes Absolute: 0.8 10*3/uL (ref 0.1–1.0)
Monocytes Relative: 11 % (ref 3.0–12.0)
Neutro Abs: 5.2 10*3/uL (ref 1.4–7.7)
Neutrophils Relative %: 72.4 % (ref 43.0–77.0)
Platelets: 342 10*3/uL (ref 150.0–400.0)
RBC: 3.8 Mil/uL — ABNORMAL LOW (ref 3.87–5.11)
RDW: 14.2 % (ref 11.5–15.5)
WBC: 7.2 10*3/uL (ref 4.0–10.5)

## 2019-07-10 LAB — COMPREHENSIVE METABOLIC PANEL
ALT: 2 U/L (ref 0–35)
AST: 14 U/L (ref 0–37)
Albumin: 4.3 g/dL (ref 3.5–5.2)
Alkaline Phosphatase: 80 U/L (ref 39–117)
BUN: 18 mg/dL (ref 6–23)
CO2: 29 mEq/L (ref 19–32)
Calcium: 9.1 mg/dL (ref 8.4–10.5)
Chloride: 102 mEq/L (ref 96–112)
Creatinine, Ser: 0.7 mg/dL (ref 0.40–1.20)
GFR: 82.84 mL/min (ref >60.00)
Glucose, Bld: 85 mg/dL (ref 70–99)
Potassium: 3.8 mEq/L (ref 3.5–5.1)
Sodium: 138 mEq/L (ref 135–145)
Total Bilirubin: 0.4 mg/dL (ref 0.2–1.2)
Total Protein: 6.7 g/dL (ref 6.0–8.3)

## 2019-07-10 LAB — LIPID PANEL
Cholesterol: 198 mg/dL (ref 0–200)
HDL: 69.5 mg/dL (ref >39.00)
LDL Cholesterol: 115 mg/dL — ABNORMAL HIGH (ref 0–99)
NonHDL: 128.32
Total CHOL/HDL Ratio: 3
Triglycerides: 68 mg/dL (ref 0.0–149.0)
VLDL: 13.6 mg/dL (ref 0.0–40.0)

## 2019-07-10 LAB — TSH: TSH: 2.14 u[IU]/mL (ref 0.35–4.50)

## 2019-07-10 LAB — VITAMIN D 25 HYDROXY (VIT D DEFICIENCY, FRACTURES): VITD: 20.69 ng/mL — ABNORMAL LOW (ref 30.00–100.00)

## 2019-07-10 LAB — HEMOGLOBIN A1C: Hgb A1c MFr Bld: 5.7 % (ref 4.6–6.5)

## 2019-07-10 LAB — VITAMIN B12: Vitamin B-12: 185 pg/mL — ABNORMAL LOW (ref 211–911)

## 2019-07-10 MED ORDER — VITAMIN D (ERGOCALCIFEROL) 1.25 MG (50000 UNIT) PO CAPS: 50000.0000 [IU] | ORAL_CAPSULE | ORAL | 0 refills | Status: DC

## 2019-07-10 NOTE — Addendum Note (Signed)
Addended by: Philemon Kingdom on: 07/10/2019 09:19 AM   Modules accepted: Orders

## 2019-07-10 NOTE — Progress Notes (Signed)
Established Patient Office Visit     This visit occurred during the SARS-CoV-2 public health emergency.  Safety protocols were in place, including screening questions prior to the visit, additional usage of staff PPE, and extensive cleaning of exam room while observing appropriate contact time as indicated for disinfecting solutions.    CC/Reason for Visit: Annual preventive exam and subsequent Medicare wellness visit  HPI: Kelly Howard is a 69 y.o. female who is coming in today for the above mentioned reasons. Past Medical History is significant for: Hypothyroidism, Parkinson's disease followed by neurology and osteoporosis on Boniva.  She has recently been having frequent falls, she has fractured her left wrist.  She had a significant fall earlier this year with left hip and thigh bruising.  But no fractures.  She has routine eye and dental care.  She is due to follow-up with GYN.  She is due for Pneumovax, shingles and Covid vaccines.  She had a colonoscopy in 2019, is due for mammogram and bone density.   Past Medical/Surgical History: Past Medical History:  Diagnosis Date  . Frequent falls   . Hypothyroidism   . Osteoporosis   . Parkinson's disease [facility])     Past Surgical History:  Procedure Laterality Date  . TONSILLECTOMY    . WRIST SURGERY      Social History:  reports that she has never smoked. She has never used smokeless tobacco. She reports current alcohol use. She reports that she does not use drugs.  Allergies: Allergies  Allergen Reactions  . Codeine Other (See Comments)    GI Upset  . Escitalopram Swelling    Foot swelling  . Propoxyphene Other (See Comments)    Hallucinations    Family History:  Family History  Problem Relation Age of Onset  . Hypercalcemia Mother   . Cancer Paternal Grandmother   . Diabetes Paternal Grandfather      Current Outpatient Medications:  .  AMBULATORY NON FORMULARY MEDICATION, U step walker  Dx: G20,  Disp: 1 Device, Rfl: 0 .  carbidopa-levodopa (SINEMET IR) 25-100 MG tablet, Take 1.5 tablets by mouth 5 (five) times daily. 1.5 tablet every 3 hrs, Disp: 450 tablet, Rfl: 0 .  Carbidopa-Levodopa ER (SINEMET CR) 25-100 MG tablet controlled release, Take 1 tablet by mouth at bedtime., Disp: 90 tablet, Rfl: 0 .  clonazePAM (KLONOPIN) 0.5 MG tablet, Take 1 tablet (0.5 mg total) by mouth at bedtime., Disp: 45 tablet, Rfl: 0 .  Fish Oil-Cholecalciferol (OMEGA-3 + VITAMIN D3) 1110-300 MG-UNIT CAPS, Take 1 tablet by mouth daily., Disp: , Rfl:  .  ibandronate (BONIVA) 150 MG tablet, , Disp: , Rfl:  .  ibuprofen (ADVIL) 800 MG tablet, ibuprofen 800 mg tablet, Disp: , Rfl:  .  levothyroxine (SYNTHROID, LEVOTHROID) 50 MCG tablet, Take by mouth., Disp: , Rfl:  .  Melatonin 3 MG TABS, Take 9 mg by mouth at bedtime. Take three 3 mg tablets at bedtime, Disp: , Rfl:  .  rasagiline (AZILECT) 1 MG TABS tablet, Take 1 tablet (1 mg total) by mouth daily., Disp: 30 tablet, Rfl: 2 .  sertraline (ZOLOFT) 50 MG tablet, sertraline 50 mg tablet, Disp: , Rfl:   Review of Systems:  Constitutional: Denies fever, chills, diaphoresis, appetite change and fatigue.  HEENT: Denies photophobia, eye pain, redness, hearing loss, ear pain, congestion, sore throat, rhinorrhea, sneezing, mouth sores, trouble swallowing, neck pain, neck stiffness and tinnitus.   Respiratory: Denies SOB, DOE, cough, chest tightness,  and wheezing.  Cardiovascular: Denies chest pain, palpitations and leg swelling.  Gastrointestinal: Denies nausea, vomiting, abdominal pain, diarrhea, constipation, blood in stool and abdominal distention.  Genitourinary: Denies dysuria, urgency, frequency, hematuria, flank pain and difficulty urinating.  Endocrine: Denies: hot or cold intolerance, sweats, changes in hair or nails, polyuria, polydipsia. Musculoskeletal: Denies myalgias, back pain, joint swelling, arthralgias and gait problem.  Skin: Denies pallor, rash and  wound.  Neurological: Denies dizziness, seizures, syncope, weakness, light-headedness, numbness and headaches.  Hematological: Denies adenopathy. Easy bruising, personal or family bleeding history  Psychiatric/Behavioral: Denies suicidal ideation, mood changes, confusion, nervousness, sleep disturbance and agitation    Physical Exam: Vitals:   07/10/19 0813  BP: 110/80  Pulse: 72  Temp: 97.7 F (36.5 C)  TempSrc: Temporal  SpO2: 97%  Weight: 131 lb 6.4 oz (59.6 kg)  Height: 5' 6.5" (1.689 m)    Body mass index is 20.89 kg/m.   Constitutional: NAD, calm, comfortable Eyes: PERRL, lids and conjunctivae normal ENMT: Mucous membranes are moist. Tympanic membrane is pearly white, no erythema or bulging. Neck: normal, supple, no masses, no thyromegaly Respiratory: clear to auscultation bilaterally, no wheezing, no crackles. Normal respiratory effort. No accessory muscle use.  Cardiovascular: Regular rate and rhythm, no murmurs / rubs / gallops. No extremity edema. 2+ pedal pulses. No carotid bruits.  Abdomen: no tenderness, no masses palpated. No hepatosplenomegaly. Bowel sounds positive.  Musculoskeletal: no clubbing / cyanosis. No joint deformity upper and lower extremities. Good ROM, no contractures. Normal muscle tone.  Skin: no rashes, lesions, ulcers. No induration Neurologic: CN 2-12 grossly intact. Sensation intact, DTR normal. Strength 5/5 in all 4.  Psychiatric: Normal judgment and insight. Alert and oriented x 3. Normal mood.    Subsequent Medicare wellness visit   1. Risk factors, based on past  M,S,F -no discernible cardiovascular disease risk factors   2.  Physical activities: Not very physically active due to her frequent falls and history of Parkinson's   3.  Depression/mood:  Stable, not depressed although she is on Zoloft   4.  Hearing:  No perceived issues   5.  ADL's: Needs assistance with ADLs due to Parkinson's disease   6.  Fall risk:  High fall risk    7.  Home safety: No problems identified   8.  Height weight, and visual acuity: Height and weight as above, visual acuity is 20/32 with each eye independently and 20/25 with eyes together   9.  Counseling:  Advised to sign up on the waiting list for the Covid vaccine, advised to get shingles vaccination at pharmacy.   10. Lab orders based on risk factors: Laboratory update will be reviewed   11. Referral :  None today   12. Care plan:  Follow-up with me in 6 months   13. Cognitive assessment:  No cognitive impairment identified   14. Screening: Patient provided with a written and personalized 5-10 year screening schedule in the AVS.   yes   15. Provider List Update:   PCP, ophthalmology, neurology (Dr. Loralee Pacas)  45. Advance Directives: Full code     Office Visit from 04/11/2019 in [place] at [place]  PHQ-9 Total Score  3      Fall Risk  06/10/2019 04/11/2019  Falls in the past year? 1 1  Number falls in past yr: 1 1  Injury with Fall? 1 1     Impression and Plan:  Encounter for preventive health examination  -She has routine eye and dental care. -Pneumovax today,  due for shingles series and Covid vaccine. -Screening labs today. -Healthy lifestyle discussed in detail. -Had colonoscopy in 2019 and is a 10-year callback. -She is overdue for mammogram, last was in July 2019, she will have this done in her [place] office. -Pap smear to be done by GYN as well as bone density.  Hypothyroidism, unspecified type  - Plan: TSH  Parkinson's disease ([place])  Frequent falls -Followed by neurology  Age-related osteoporosis without current pathological fracture  -On Boniva.   Patient Instructions  -Nice seeing you today!!  -Lab work today; will notify you once results are available.  -Pneumonia vaccine today.  -Shingles vaccine at the pharmacy.  -Remember to follow up with GYN.  We are committed to keeping you informed about the COVID-19 vaccine.  As the  vaccine continues to become available for each phase, we will ensure that patients who meet the criteria receive the information they need to access vaccination opportunities. Continue to check your MyChart account and [email or web address] for updates. Please review the Phase 1b information below.  [place] On Tuesday, Jan. 19, the [facility]) and [place] began large-scale COVID-19 vaccinations at the [place]. The vaccinations are appointment only and for those 64 and older.  Walk-ins will not be accepted.  All appointments are currently filled. Please join our waiting list for the next available appointments. We will contact you when appointments become available. Please do not sign up more than once.  Join Our Waiting List  Our daily vaccination capacity will continue to increase, including expansion of our [employer] site, increasing mobile clinics across our service region and plans to open COVID-19 vaccination clinics in [place] and [place] counties.  Other Vaccination Opportunities in [place] We are also working in partnership with county health agencies in our service counties to ensure continuing vaccination availability in the weeks and months ahead. Learn more about each county's vaccination efforts in the website links below:   [place]'s phase 1b vaccination guidelines, prioritizing those 65 and over as the next eligible group to receive the COVID-19 vaccine, are detailed at [email or web address].[email or web address].   Vaccine Safety and Effectiveness Clinical trials for the Pfizer COVID-19 vaccine involved 42,000 people and showed that the vaccine is more than 95% effective in preventing COVID-19 with no serious safety concerns. Similar results have been reported for the Moderna COVID-19  vaccine. Side effects reported in the [place] clinical trials include a sore arm at the injection site, fatigue, headache, chills and fever. While side effects from the [place] COVID-19 vaccine are higher than for a typical flu vaccine, they are lower in many ways than side effects from the leading vaccine to prevent shingles. Side effects are signs that a vaccine is working and are related to your immune system being stimulated to produce antibodies against infection. Side effects from vaccination are far less significant than health impacts from COVID-19.  Staying Informed Pharmacists, infectious disease doctors, critical care nurses and other experts at [facility] continue to speak publicly through media interviews and direct communication with our patients and communities about the safety, effectiveness and importance of vaccines to eliminate COVID-19. In addition, reliable information on vaccine safety, effectiveness, side effects and more is available on the following websites:  [place] Department of Health and Human Services COVID-19 Vaccine Information Website.  U[place]S. Centers for Disease Control and Prevention COVID-19  Human resources officer.  Staying Safe We agree with the CDC on what we can do to help our communities get back to normal: Getting "back to normal" is going to take all of our tools. If we use all the tools we have, we stand the best chance of getting our families, communities, schools and workplaces "back to normal" sooner:  Get vaccinated as soon as vaccines become available within the phase of the state's vaccination rollout plan for which you meet the eligibility criteria.  Wear a mask.  Stay 6 feet from others and avoid crowds.  Wash hands often.  For our most current information, please visit [email or web address].   Preventive Care 86 Years and Older, Female Preventive care refers to lifestyle choices and visits with your health care provider that  can promote health and wellness. This includes:  A yearly physical exam. This is also called an annual well check.  Regular dental and eye exams.  Immunizations.  Screening for certain conditions.  Healthy lifestyle choices, such as diet and exercise. What can I expect for my preventive care visit? Physical exam Your health care provider will check:  Height and weight. These may be used to calculate body mass index (BMI), which is a measurement that tells if you are at a healthy weight.  Heart rate and blood pressure.  Your skin for abnormal spots. Counseling Your health care provider may ask you questions about:  Alcohol, tobacco, and drug use.  Emotional well-being.  Home and relationship well-being.  Sexual activity.  Eating habits.  History of falls.  Memory and ability to understand (cognition).  Work and work Statistician.  Pregnancy and menstrual history. What immunizations do I need?  Influenza (flu) vaccine  This is recommended every year. Tetanus, diphtheria, and pertussis (Tdap) vaccine  You may need a Td booster every 10 years. Varicella (chickenpox) vaccine  You may need this vaccine if you have not already been vaccinated. Zoster (shingles) vaccine  You may need this after age 50. Pneumococcal conjugate (PCV13) vaccine  One dose is recommended after age 88. Pneumococcal polysaccharide (PPSV23) vaccine  One dose is recommended after age 3. Measles, mumps, and rubella (MMR) vaccine  You may need at least one dose of MMR if you were born in 1957 or later. You may also need a second dose. Meningococcal conjugate (MenACWY) vaccine  You may need this if you have certain conditions. Hepatitis A vaccine  You may need this if you have certain conditions or if you travel or work in places where you may be exposed to hepatitis A. Hepatitis B vaccine  You may need this if you have certain conditions or if you travel or work in places where you  may be exposed to hepatitis B. Haemophilus influenzae type b (Hib) vaccine  You may need this if you have certain conditions. You may receive vaccines as individual doses or as more than one vaccine together in one shot (combination vaccines). Talk with your health care provider about the risks and benefits of combination vaccines. What tests do I need? Blood tests  Lipid and cholesterol levels. These may be checked every 5 years, or more frequently depending on your overall health.  Hepatitis C test.  Hepatitis B test. Screening  Lung cancer screening. You may have this screening every year starting at age 18 if you have a 30-pack-year history of smoking and currently smoke or have quit within the past 15 years.  Colorectal cancer screening. All adults should have this  screening starting at age 37 and continuing until age 40. Your health care provider may recommend screening at age 57 if you are at increased risk. You will have tests every 1-10 years, depending on your results and the type of screening test.  Diabetes screening. This is done by checking your blood sugar (glucose) after you have not eaten for a while (fasting). You may have this done every 1-3 years.  Mammogram. This may be done every 1-2 years. Talk with your health care provider about how often you should have regular mammograms.  BRCA-related cancer screening. This may be done if you have a family history of breast, ovarian, tubal, or peritoneal cancers. Other tests  Sexually transmitted disease (STD) testing.  Bone density scan. This is done to screen for osteoporosis. You may have this done starting at age 71. Follow these instructions at home: Eating and drinking  Eat a diet that includes fresh fruits and vegetables, whole grains, lean protein, and low-fat dairy products. Limit your intake of foods with high amounts of sugar, saturated fats, and salt.  Take vitamin and mineral supplements as recommended by  your health care provider.  Do not drink alcohol if your health care provider tells you not to drink.  If you drink alcohol: ? Limit how much you have to 0-1 drink a day. ? Be aware of how much alcohol is in your drink. In the [place] one drink equals one 12 oz bottle of beer (355 mL), one 5 oz glass of wine (148 mL), or one 1 oz glass of hard liquor (44 mL). Lifestyle  Take daily care of your teeth and gums.  Stay active. Exercise for at least 30 minutes on 5 or more days each week.  Do not use any products that contain nicotine or tobacco, such as cigarettes, e-cigarettes, and chewing tobacco. If you need help quitting, ask your health care provider.  If you are sexually active, practice safe sex. Use a condom or other form of protection in order to prevent STIs (sexually transmitted infections).  Talk with your health care provider about taking a low-dose aspirin or statin. What's next?  Go to your health care provider once a year for a well check visit.  Ask your health care provider how often you should have your eyes and teeth checked.  Stay up to date on all vaccines. This information is not intended to replace advice given to you by your health care provider. Make sure you discuss any questions you have with your health care provider. Document Revised: 05/10/2018 Document Reviewed: 05/10/2018 Elsevier Patient Education  2020 [place], [place] [facility]

## 2019-07-10 NOTE — Addendum Note (Signed)
Addended by: Kern Reap B on: 07/10/2019 10:02 AM   Modules accepted: Orders

## 2019-07-10 NOTE — Patient Instructions (Signed)
-Nice seeing you today!!  -Lab work today; will notify you once results are available.  -Pneumonia vaccine today.  -Shingles vaccine at the pharmacy.  -Remember to follow up with GYN.  We are committed to keeping you informed about the COVID-19 vaccine.  As the vaccine continues to become available for each phase, we will ensure that patients who meet the criteria receive the information they need to access vaccination opportunities. Continue to check your MyChart account and [email or web address] for updates. Please review the Phase 1b information below.  [place] On Tuesday, Jan. 19, the [facility]) and [place] began large-scale COVID-19 vaccinations at the [place]. The vaccinations are appointment only and for those 62 and older.  Walk-ins will not be accepted.  All appointments are currently filled. Please join our waiting list for the next available appointments. We will contact you when appointments become available. Please do not sign up more than once.  Join Our Waiting List  Our daily vaccination capacity will continue to increase, including expansion of our [employer] site, increasing mobile clinics across our service region and plans to open COVID-19 vaccination clinics in [place] and [place] counties.  Other Vaccination Opportunities in [place] We are also working in partnership with county health agencies in our service counties to ensure continuing vaccination availability in the weeks and months ahead. Learn more about each county's vaccination efforts in the website links below:   [place]'s phase 1b vaccination guidelines, prioritizing those 65 and over as the next eligible group to receive the COVID-19 vaccine, are detailed at [email or web address].   Vaccine  Safety and Effectiveness Clinical trials for the Pfizer COVID-19 vaccine involved 42,000 people and showed that the vaccine is more than 95% effective in preventing COVID-19 with no serious safety concerns. Similar results have been reported for the Moderna COVID-19 vaccine. Side effects reported in the [place] clinical trials include a sore arm at the injection site, fatigue, headache, chills and fever. While side effects from the [place] COVID-19 vaccine are higher than for a typical flu vaccine, they are lower in many ways than side effects from the leading vaccine to prevent shingles. Side effects are signs that a vaccine is working and are related to your immune system being stimulated to produce antibodies against infection. Side effects from vaccination are far less significant than health impacts from COVID-19.  Staying Informed Pharmacists, infectious disease doctors, critical care nurses and other experts at [facility] continue to speak publicly through media interviews and direct communication with our patients and communities about the safety, effectiveness and importance of vaccines to eliminate COVID-19. In addition, reliable information on vaccine safety, effectiveness, side effects and more is available on the following websites:  [place] Department of Health and Human Services COVID-19 Vaccine Information Website.  U.S. Centers for Disease Control and Prevention YJEHU-31 Human resources officer.  Staying Safe We agree with the CDC on what we can do to help our communities get back to normal: Getting "back to normal" is going to take all of our tools. If we use all the tools we have, we stand the best chance of getting our families, communities, schools and workplaces "back to normal" sooner:  Get vaccinated as soon as vaccines become available within the phase of the state's vaccination rollout plan for which you meet the eligibility criteria.  Wear  a mask.  Stay 6 feet from  others and avoid crowds.  Wash hands often.  For our most current information, please visit [email or web address].   Preventive Care 21 Years and Older, Female Preventive care refers to lifestyle choices and visits with your health care provider that can promote health and wellness. This includes:  A yearly physical exam. This is also called an annual well check.  Regular dental and eye exams.  Immunizations.  Screening for certain conditions.  Healthy lifestyle choices, such as diet and exercise. What can I expect for my preventive care visit? Physical exam Your health care provider will check:  Height and weight. These may be used to calculate body mass index (BMI), which is a measurement that tells if you are at a healthy weight.  Heart rate and blood pressure.  Your skin for abnormal spots. Counseling Your health care provider may ask you questions about:  Alcohol, tobacco, and drug use.  Emotional well-being.  Home and relationship well-being.  Sexual activity.  Eating habits.  History of falls.  Memory and ability to understand (cognition).  Work and work Statistician.  Pregnancy and menstrual history. What immunizations do I need?  Influenza (flu) vaccine  This is recommended every year. Tetanus, diphtheria, and pertussis (Tdap) vaccine  You may need a Td booster every 10 years. Varicella (chickenpox) vaccine  You may need this vaccine if you have not already been vaccinated. Zoster (shingles) vaccine  You may need this after age 45. Pneumococcal conjugate (PCV13) vaccine  One dose is recommended after age 46. Pneumococcal polysaccharide (PPSV23) vaccine  One dose is recommended after age 14. Measles, mumps, and rubella (MMR) vaccine  You may need at least one dose of MMR if you were born in 1957 or later. You may also need a second dose. Meningococcal conjugate (MenACWY) vaccine  You may need this if you have certain  conditions. Hepatitis A vaccine  You may need this if you have certain conditions or if you travel or work in places where you may be exposed to hepatitis A. Hepatitis B vaccine  You may need this if you have certain conditions or if you travel or work in places where you may be exposed to hepatitis B. Haemophilus influenzae type b (Hib) vaccine  You may need this if you have certain conditions. You may receive vaccines as individual doses or as more than one vaccine together in one shot (combination vaccines). Talk with your health care provider about the risks and benefits of combination vaccines. What tests do I need? Blood tests  Lipid and cholesterol levels. These may be checked every 5 years, or more frequently depending on your overall health.  Hepatitis C test.  Hepatitis B test. Screening  Lung cancer screening. You may have this screening every year starting at age 27 if you have a 30-pack-year history of smoking and currently smoke or have quit within the past 15 years.  Colorectal cancer screening. All adults should have this screening starting at age 5 and continuing until age 75. Your health care provider may recommend screening at age 75 if you are at increased risk. You will have tests every 1-10 years, depending on your results and the type of screening test.  Diabetes screening. This is done by checking your blood sugar (glucose) after you have not eaten for a while (fasting). You may have this done every 1-3 years.  Mammogram. This may be done every 1-2 years. Talk with your health care provider about how  often you should have regular mammograms.  BRCA-related cancer screening. This may be done if you have a family history of breast, ovarian, tubal, or peritoneal cancers. Other tests  Sexually transmitted disease (STD) testing.  Bone density scan. This is done to screen for osteoporosis. You may have this done starting at age 58. Follow these instructions at  home: Eating and drinking  Eat a diet that includes fresh fruits and vegetables, whole grains, lean protein, and low-fat dairy products. Limit your intake of foods with high amounts of sugar, saturated fats, and salt.  Take vitamin and mineral supplements as recommended by your health care provider.  Do not drink alcohol if your health care provider tells you not to drink.  If you drink alcohol: ? Limit how much you have to 0-1 drink a day. ? Be aware of how much alcohol is in your drink. In the [place] one drink equals one 12 oz bottle of beer (355 mL), one 5 oz glass of wine (148 mL), or one 1 oz glass of hard liquor (44 mL). Lifestyle  Take daily care of your teeth and gums.  Stay active. Exercise for at least 30 minutes on 5 or more days each week.  Do not use any products that contain nicotine or tobacco, such as cigarettes, e-cigarettes, and chewing tobacco. If you need help quitting, ask your health care provider.  If you are sexually active, practice safe sex. Use a condom or other form of protection in order to prevent STIs (sexually transmitted infections).  Talk with your health care provider about taking a low-dose aspirin or statin. What's next?  Go to your health care provider once a year for a well check visit.  Ask your health care provider how often you should have your eyes and teeth checked.  Stay up to date on all vaccines. This information is not intended to replace advice given to you by your health care provider. Make sure you discuss any questions you have with your health care provider. Document Revised: 05/10/2018 Document Reviewed: 05/10/2018 Elsevier Patient Education  2020 [employer].

## 2019-07-11 ENCOUNTER — Encounter: Payer: Medicare PPO | Admitting: Speech Pathology

## 2019-07-11 ENCOUNTER — Ambulatory Visit: Payer: Medicare PPO | Admitting: Physical Therapy

## 2019-07-15 ENCOUNTER — Other Ambulatory Visit: Payer: Self-pay

## 2019-07-15 ENCOUNTER — Other Ambulatory Visit: Payer: Self-pay | Admitting: Neurology

## 2019-07-15 ENCOUNTER — Ambulatory Visit (INDEPENDENT_AMBULATORY_CARE_PROVIDER_SITE_OTHER): Payer: Medicare PPO | Admitting: *Deleted

## 2019-07-15 DIAGNOSIS — E538 Deficiency of other specified B group vitamins: Secondary | ICD-10-CM | POA: Diagnosis not present

## 2019-07-15 MED ORDER — CYANOCOBALAMIN 1000 MCG/ML IJ SOLN
1000.0000 ug | Freq: Once | INTRAMUSCULAR | Status: AC
  Administered 2019-07-15: 09:00:00 1000 ug via INTRAMUSCULAR

## 2019-07-15 NOTE — Progress Notes (Signed)
Per orders of Dr. Hernandez, injection of B12 given by Garvin Ellena S Tashana Haberl. Patient tolerated injection well. 

## 2019-07-16 ENCOUNTER — Ambulatory Visit: Payer: Medicare PPO | Attending: Obstetrics and Gynecology | Admitting: Physical Therapy

## 2019-07-16 ENCOUNTER — Encounter: Payer: Self-pay | Admitting: Physical Therapy

## 2019-07-16 ENCOUNTER — Ambulatory Visit: Payer: Medicare PPO

## 2019-07-16 DIAGNOSIS — R293 Abnormal posture: Secondary | ICD-10-CM | POA: Diagnosis present

## 2019-07-16 DIAGNOSIS — R41841 Cognitive communication deficit: Secondary | ICD-10-CM | POA: Insufficient documentation

## 2019-07-16 DIAGNOSIS — R29818 Other symptoms and signs involving the nervous system: Secondary | ICD-10-CM | POA: Diagnosis present

## 2019-07-16 DIAGNOSIS — R471 Dysarthria and anarthria: Secondary | ICD-10-CM

## 2019-07-16 DIAGNOSIS — R2681 Unsteadiness on feet: Secondary | ICD-10-CM | POA: Diagnosis present

## 2019-07-16 DIAGNOSIS — R2689 Other abnormalities of gait and mobility: Secondary | ICD-10-CM | POA: Diagnosis present

## 2019-07-16 NOTE — Patient Instructions (Signed)
Access Code: [email or web address] URL: [email or web address] Date: 07/16/2019 Prepared by: Lonia Blood  Program Notes For SAFETY with WALKING: -SLOW DOWN -HEEL TO TOE WALKING -LOOK AHEAD AT WHERE YOU ARE WALKING      Exercises Seated Active Hip Flexion - 10 reps - 3 sets - 1x daily - 7x weekly Mini Squat - 10 reps - 1 sets - 1x daily - 7x weekly

## 2019-07-16 NOTE — Therapy (Signed)
[facility] [address] [place], [place], [address] Phone: [phone]   Fax:  [phone]  Speech Language Pathology Treatment  Patient Details  Name: Pam Vanalstine MRN: [id number] Date of Birth: 05-20-1951 Referring Provider (SLP): Eldridge Abrahams, MD; pt asked for cc: to [name], DO   Encounter Date: 07/16/2019  End of Session - 07/16/19 1022    Visit Number  6    Number of Visits  17    Date for SLP Re-Evaluation  07/26/19    SLP Start Time  90    SLP Stop Time   1004    SLP Time Calculation (min)  29 min    Activity Tolerance  Patient tolerated treatment well       Past Medical History:  Diagnosis Date  . Frequent falls   . Hypothyroidism   . Osteoporosis   . Parkinson's disease [facility])     Past Surgical History:  Procedure Laterality Date  . TONSILLECTOMY    . WRIST SURGERY      There were no vitals filed for this visit.  Subjective Assessment - 07/16/19 0940    Subjective  Pt more fidgety today, more dyskenisias than previous 2 sessions. Continues to re-cross legs.    Currently in Pain?  No/denies            ADULT SLP TREATMENT - 07/16/19 0941      General Information   Behavior/Cognition  Alert;Cooperative;Decreased sustained attention      Treatment Provided   Treatment provided  Cognitive-Linquistic      Cognitive-Linquistic Treatment   Treatment focused on  Cognition    Skilled Treatment  Today, pt repeated medication questions throughout the session, even after SLP told pt x3 that SLP cannot answer medication questions. Pt presenting very much like previous to d/c of pramiprexole; fidegety, asking unassociated questions about other things repeatedly, decr'd attention and awareness. SLP attempted to look at pt's Klonipin dosage instructions from PCP but they are unclear as pt's med list and PCP OV notes from 07-10-19 states one tab at bedtime, but pt's PCP OV notes from  06/14/19 states one half tab twice a day. Due to decr'd cognitive presentation compared to presentation after d/c of Pramipexole, SLP held the remainder of cognitive testing today. "I keep getting new medicines, or continuing old medicines. I can't keep it straight." SLP reviewed her PD meds with pt and she stated "I can keep those straight, it's the other new ones I don't know about." (Pt states her new PCP presciribed her two new meds.) Pt reported she took her two doses of PD meds this am (6AM, 9AM). SLP addressed directly with pt re: reduced attention and incr'd activity and dyskinesias than previous 2-3 sessions ad pt stated there was more going on at home and she was more stressed recently.  SLP wrapped up ST session when pt asked again about medication interaction in relation to mood then SLP again reminded pt that SLP could not answer questions about meds, and also to talk with PCP about these questions. SLP informed pt's PT of these details.       Assessment / Recommendations / Plan   Plan  Continue with current plan of care      Progression Toward Goals   Progression toward goals  Not progressing toward goals (comment)   appears anxious, very fidgety-unable to hold attention      SLP Education - 07/16/19 1022    Education Details  SLP cannot answer medication questions    Person(s) Educated  Patient    Methods  Explanation    Comprehension  Verbalized understanding;Need further instruction       SLP Short Term Goals - 06/28/19 0922      SLP SHORT TERM GOAL #1   Title  pt will demo selective attention to therapy tasks for 10 minutes in min noisy environment over two sessions    Baseline  06-24-19, 06-28-19    Status  Achieved      SLP SHORT TERM GOAL #2   Title  pt will demo improved awareness by double checking answers/work in therapy tasks 100% of the time over three sessions    Status  Partially Met      SLP [place] #3   Title  pt will demo functional  reasoning/problem solving skills in simple functional linguistic tasks in 3 therapy sessions    Baseline  06-24-19    Status  Partially Met      SLP SHORT TERM GOAL #4   Title  pt will complete full cognitive-linguistic testing    Status  Achieved      SLP SHORT TERM GOAL #5   Title  pt will improve speech volume to upper 60s - low 70s dB in 5 minutes simple conversation x3 sessions    Time  1    Period  Weeks    Status  Deferred       SLP Long Term Goals - 06/28/19 3491      SLP LONG TERM GOAL #1   Title  pt will demonstrate WFL selective atteniton in min-mod noisy environment for 15 mintue long therapy task x3 sessions    Time  5    Period  Weeks   or 17 sessions, for all LTGs   Status  On-going      SLP LONG TERM GOAL #2   Title  pt will demo improved problem solving ability in lingustic problem solving tasks with rare min A over three sessions    Time  5    Period  Weeks    Status  On-going      SLP LONG TERM GOAL #3   Title  pt will improve speech volume to upper 60s - low 70s dB in 10 minutes simple conversation x3 sessions    Time  5    Period  Weeks    Status  On-going       Plan - 07/16/19 1023    Clinical Impression Statement  Pt presents with cognitive communicaiton deficits demonstrated in the areas of attention, awareness (primarily safety awareness), and problem solving complicated by pt's high level of impulsivity. SLP held the continuation of Cognitive Lingustic Quick Test (CLQT) due to decr'd attention (see "skilled treatment"). Pt also cont to present with dysarthria caused by decr'd breath support for speech resulting in average volume softer than WNL. Pt would benefit from skilled ST targeting cognitive commnication skills as well as improving pt volume in conversation. Pt agrees that work on Immunologist would best be done first, then work with improving pt's volume.    Speech Therapy Frequency  2x / week    Duration  --   8 weeks, or  17 total visits   Treatment/Interventions  Environmental controls;Compensatory techniques;Cueing hierarchy;Cognitive reorganization;Internal/external aids;Patient/family education;SLP instruction and feedback;Functional tasks    Potential to Achieve Goals  Good    Potential Considerations  Ability to learn/carryover information;Severity of impairments  Patient will benefit from skilled therapeutic intervention in order to improve the following deficits and impairments:   Cognitive communication deficit  Dysarthria and anarthria    Problem List Patient Active Problem List   Diagnosis Date Noted  . Vitamin D deficiency 07/10/2019  . Vitamin B12 deficiency 07/10/2019  . Hypothyroidism   . Parkinson's disease ([place])   . Frequent falls   . Osteoporosis     [place] ,[place], CCC-SLP  07/16/2019, 10:24 AM  [facility] [address] [place], [place], [address] Phone: [phone]   Fax:  [phone]   Name: Kawanna Christley MRN: [id number] Date of Birth: December 30, 1950

## 2019-07-17 NOTE — Therapy (Signed)
[facility] [address] [place], [place], [address] Phone: [phone]   Fax:  [phone]  Physical Therapy Treatment  Patient Details  Name: Kelly Howard MRN: [id number] Date of Birth: Mar 21, 1951 Referring Provider (PT): Siddiqui (Pt is seeing Dr. Arbutus Leas for first visit 05/01/2019)   Encounter Date: 07/16/2019  PT End of Session - 07/17/19 0938    Visit Number  7    Number of Visits  17    Date for PT Re-Evaluation  08/23/19   per recert 06/24/2019   Authorization Type  [employer]-auth request completed 06/24/2019    PT Start Time  1018    PT Stop Time  1102    PT Time Calculation (min)  44 min    Activity Tolerance  Patient tolerated treatment well    Behavior During Therapy  Restless;Anxious;Impulsive   fidgety throughout session, asks same questions multiple times; needs frequent redirection      Past Medical History:  Diagnosis Date  . Frequent falls   . Hypothyroidism   . Osteoporosis   . Parkinson's disease [facility])     Past Surgical History:  Procedure Laterality Date  . TONSILLECTOMY    . WRIST SURGERY      There were no vitals filed for this visit.  Subjective Assessment - 07/16/19 1022    Subjective  Doing better from the surgery.  Still not 100%.  Per pt's order from MD, no use of L wrist at present.  Had 2 near falls/falls yesterday.  Don't know what I was doing to cause that.  My husband says I don't look where I'm going.    Patient Stated Goals  Pt's goal for therapy is to walk without fear of falling.    Currently in Pain?  No/denies    Pain Onset  1 to 4 weeks ago                       [facility] Adult PT Treatment/Exercise - 07/16/19 1043      Ambulation/Gait   Ambulation/Gait  Yes    Ambulation/Gait Assistance  5: Supervision    Ambulation/Gait Assistance Details  no device, wearing 2# weights on ankles, cues for heel strike with gait    Ambulation Distance (Feet)   460 Feet   230   Assistive device  None    Gait Pattern  Step-through pattern;Decreased arm swing - right;Decreased arm swing - left;Decreased dorsiflexion - right;Decreased dorsiflexion - left;Poor foot clearance - left;Poor foot clearance - right    Ambulation Surface  Level;Indoor    Gait Comments  Gait wearing ankle weights for increased proprioception, with good, consistent heel strike with gait.  Once ankle weights removed, pt continues with good carryover of heelstrike; however, she requires cues for looking ahead at target (versus looking L, R, up/down with gait-unsure if this is volitional or dystonia); pt able to attend to visual target briefly with frequent cues      Self-Care   Self-Care  Other Self-Care Comments    Other Self-Care Comments   Pt has multiple questions about therapy and non-therapy related items.  Addressed therapy-specific questions:  Pt asks about what she needs to do about U-step walker.  PT reiterates that at last session, she was given info to call company about insurance coverage and how to order.  Offered to give pt that informaiton again, and she states she has it at home.  Educated pt that she cannot use it  right now due to limitations following L wrist surgery; however, she may want to initiate process of ordering.  Pt asks about what types of exercise to do to get stronger (going back to her trainer to work upper body, going to [employer]):  Pt defers upper body work to pt needs to get cleared by MD first, as his current info says no use of L wrist at this time; PT educates patient that [employer] is not a safe option at this time due to pt's risk and hx of falls as well as limitations of LUE use.  Educated pt that PT is providing her with HEP today and her main focus needs to be to SLOW down gait to decrease fall risk.      Exercises   Exercises  Other Exercises    Other Exercises   Seated postural stability/control exercises:  forward  lean>upright posture (hold in upright position x 5-10 seconds, initial 3 reps, pt provides rhythmic stabilization to assist with trunk control), then lateral weigthshifting and reaching across body, cues for slow, control motion x 10 reps each side.  Then lateral weightshift and step out/step in, with cues for large motions and trunk control, x 10 reps each side.  Sit to stand, x 10 reps, with upright posture upon standing 5 seconds (initial reps with rhythmic stabilization for trunk control).  Seated heel/toe raises, with focus on toe raises with 3 second hold for ankle dorsiflexion.             PT Education - 07/17/19 0937    Education Details  Updates to HEP-see instructions; additional info in self care    Person(s) Educated  Patient    Methods  Explanation;Demonstration;Verbal cues;Handout    Comprehension  Verbalized understanding;Returned demonstration;Verbal cues required;Need further instruction       PT Short Term Goals - 07/17/19 0947      PT SHORT TERM GOAL #1   Title  Pt will be independent with HEP to address balance, transfers, gait.  TARGET 07/05/2019 (EXTENDED due to pt missing appointments for L wrist surgery):  new target 07/26/2019    Time  2    Period  Weeks    Status  On-going      PT SHORT TERM GOAL #2   Title  Pt will perform sit<>stand at least 8 of 10 reps from low surfaces, simulating sofa, with no posterior lean, modified independently.    Time  2    Period  Weeks    Status  On-going      PT SHORT TERM GOAL #3   Title  Pt will verbalize understanding of fall prevention in home environment.    Time  2    Period  Weeks    Status  On-going        PT Long Term Goals - 07/17/19 0948      PT LONG TERM GOAL #1   Title  Pt will be independent with HEP for Parkinson's specific exercises, to address posture, transfers, balance, gait.  (Extended due to pt missing appts for L wrist surgery, New target:  08/23/2019)    Time  6    Period  Weeks    Status   On-going      PT LONG TERM GOAL #2   Title  Pt will perform TUG and TUG cognitive within 10% time difference, for improved ability for dual tasking with gait.    Time  6    Period  Weeks    Status  On-going      PT LONG TERM GOAL #3   Title  Pt will improve MiniBESTest score to at least 25/28 for improved balance, decreased fall risk.    Time  6    Period  Weeks    Status  Revised      PT LONG TERM GOAL #4   Title  Pt will verbalize plans for ongoing community fitness upon d/c from PT.    Time  6    Period  Weeks    Status  On-going            Plan - 07/17/19 0941    Clinical Impression Statement  Pt returns to PT for first visit since L wrist surgery, with instructions from MD for no LUE use at this time.  Will extend goals out accordingly, as pt just returns this week.  Pt asks multiple questions (PT and non-PT related, repeatedly throughout session, with therapist needing to redirect frequently to task at hand.  Updated HEP to work on postural stability and sit to stand for functional strengthening; provided cues for improved gait pattern.  Pt exhibits head motions throughout course of gait, unsure if volitional or if dystonic in nature, which leads to decreased balance and stability with gait.  Pt likely will need supervision with gait due to fall risk; would benefit from use of U-step walker when cleared by MD to use LUE.    Personal Factors and Comorbidities  Behavior Pattern;Comorbidity 3+    Comorbidities  osteoporosis, R wrist fracture (11/2018), L wrist fracture (04/22/2019), hx of rib fracture    Examination-Activity Limitations  Stand;Stairs;Transfers;Locomotion Level    Examination-Participation Restrictions  Community Activity   Exercise   Stability/Clinical Decision Making  Evolving/Moderate complexity    Rehab Potential  Good    PT Frequency  2x / week    PT Duration  6 weeks   per recert 7/78/2423   PT Treatment/Interventions  ADLs/Self Care Home Management;Gait  training;Stair training;Functional mobility training;Therapeutic activities;Therapeutic exercise;Balance training;Patient/family education;Neuromuscular re-education;DME Instruction    PT Next Visit Plan  Check STGs next week; review HEP go over fall prevention; hip/ankle/step strategies, postural stability and attention to gait    Recommended Other Services  Need to check about OT at our center (per Dr. Burney Gauze order, Ok to start PT/OT for R wrist, no restrictions)-or is she to f/u with the OT that did her splint on LUE?    Consulted and Agree with Plan of Care  Patient       Patient will benefit from skilled therapeutic intervention in order to improve the following deficits and impairments:  Abnormal gait, Decreased balance, Decreased mobility, Decreased knowledge of precautions, Decreased coordination, Difficulty walking, Decreased safety awareness, Decreased strength, Postural dysfunction  Visit Diagnosis: Other abnormalities of gait and mobility  Unsteadiness on feet  Abnormal posture     Problem List Patient Active Problem List   Diagnosis Date Noted  . Vitamin D deficiency 07/10/2019  . Vitamin B12 deficiency 07/10/2019  . Hypothyroidism   . Parkinson's disease ([place])   . Frequent falls   . Osteoporosis     Donal Lynam W. 07/17/2019, 9:50 AM  Frazier Butt., PT   [facility] [address] [place], [place], [address] Phone: [phone]   Fax:  [phone]  Name: Kelly Howard MRN: [id number] Date of Birth: 1950/10/12

## 2019-07-18 ENCOUNTER — Ambulatory Visit: Payer: Medicare PPO | Admitting: Speech Pathology

## 2019-07-18 ENCOUNTER — Ambulatory Visit: Payer: Medicare PPO | Admitting: Physical Therapy

## 2019-07-22 ENCOUNTER — Other Ambulatory Visit: Payer: Self-pay

## 2019-07-22 ENCOUNTER — Ambulatory Visit (INDEPENDENT_AMBULATORY_CARE_PROVIDER_SITE_OTHER): Payer: Medicare PPO

## 2019-07-22 DIAGNOSIS — E538 Deficiency of other specified B group vitamins: Secondary | ICD-10-CM

## 2019-07-22 MED ORDER — CYANOCOBALAMIN 1000 MCG/ML IJ SOLN
1000.0000 ug | Freq: Once | INTRAMUSCULAR | Status: AC
  Administered 2019-07-22: 1000 ug via INTRAMUSCULAR

## 2019-07-22 NOTE — Progress Notes (Signed)
Per orders of Dr. Hernandez , injection of Cyanocobalamin 1,000 mcg/mL given by Jac Romulus N Jayce Kainz. °Patient tolerated injection well. ° °

## 2019-07-23 ENCOUNTER — Ambulatory Visit: Payer: Medicare PPO | Admitting: Physical Therapy

## 2019-07-23 ENCOUNTER — Ambulatory Visit: Payer: Medicare PPO

## 2019-07-23 DIAGNOSIS — R41841 Cognitive communication deficit: Secondary | ICD-10-CM

## 2019-07-23 DIAGNOSIS — R2689 Other abnormalities of gait and mobility: Secondary | ICD-10-CM

## 2019-07-23 DIAGNOSIS — R29818 Other symptoms and signs involving the nervous system: Secondary | ICD-10-CM

## 2019-07-23 DIAGNOSIS — R2681 Unsteadiness on feet: Secondary | ICD-10-CM

## 2019-07-23 DIAGNOSIS — R293 Abnormal posture: Secondary | ICD-10-CM

## 2019-07-23 DIAGNOSIS — R471 Dysarthria and anarthria: Secondary | ICD-10-CM

## 2019-07-23 NOTE — Patient Instructions (Signed)
You brought [place] an order from Dr. Mina Marble, dated 07/04/2019:  "OK to start PT/OT for R wrist, no restrictions; No use of L wrist at present"   FOR PT:  We are working on balance and fall prevention, per our current plan of care  If you want OT to address your R (and eventually L) wrist, you will need to decide about scheduling OT appointments   -OT is available at our clinic, and they would be able to address your post-surgery needs as well as strength and coordination issues associated with your  Parkinson's  OR  -You can choose to follow-up with the OT who did your splint following your surgery.

## 2019-07-23 NOTE — Patient Instructions (Addendum)
   You and I agreed that pausing speech therapy would be a good idea right now to focus on OT and PT, which are more safety related, and also because in the last 2 (changed from "3" in the original document which went home with pt) visits you have had more and more difficulty during the session paying attention to tasks. I hope things will level off for you again and your focus will improve so that we can make the progress we were seeing a few weeks ago shortly after your appointment with Dr. Arbutus Leas.

## 2019-07-23 NOTE — Therapy (Signed)
[facility] [address] [place], [place], [address] Phone: [phone]   Fax:  [phone]  Physical Therapy Treatment  Patient Details  Name: Kelly Howard MRN: [id number] Date of Birth: 1951-04-28 Referring Provider (PT): Siddiqui (Pt is seeing Dr. Carles Collet for first visit 05/01/2019)   Encounter Date: 07/23/2019  PT End of Session - 07/23/19 1828    Visit Number  8    Number of Visits  17    Date for PT Re-Evaluation  54/62/70   per recert 3/50/0938   Authorization Type  [employer] request completed 06/24/2019    PT Start Time  0935    PT Stop Time  1017    PT Time Calculation (min)  42 min    Activity Tolerance  Patient tolerated treatment well    Behavior During Therapy  Restless;Anxious;Impulsive   fidgety throughout session, asks multiple questions; needs frequent redirection      Past Medical History:  Diagnosis Date  . Frequent falls   . Hypothyroidism   . Osteoporosis   . Parkinson's disease [facility])     Past Surgical History:  Procedure Laterality Date  . TONSILLECTOMY    . WRIST SURGERY      There were no vitals filed for this visit.  Subjective Assessment - 07/23/19 0940    Subjective  No falls in the past week.  Just very stressed.  (Later, pt reports she may have fallen, as knee is "rug-burned", but I don't know what happened).    Patient Stated Goals  Pt's goal for therapy is to walk without fear of falling.    Currently in Pain?  No/denies    Pain Onset  1 to 4 weeks ago                     Therapeutic Exercise Reviewed HEP, including seated forward lean>upright posture, x 10 reps, cues for 5 second hold.  Also, as noted below, reviewed sit<>stand with upright posture hold x 5 seconds, 10 reps.  Therapeutic Activity (includes low surface transfer training and bed mobility noted below) [facility] Adult PT Treatment/Exercise - 07/23/19 0001      Transfers   Transfers   Sit to Stand;Stand to Sit    Sit to Stand  5: Supervision;Without upper extremity assist;From bed    Stand to Sit  5: Supervision;Without upper extremity assist;To bed    Number of Reps  10 reps;2 sets   from bed, then from simulated sofa surface   Transfer Cueing  Per patient request with difficulty getting up from sofa at home, simulated sofa with BOSU on 14" block, working on scooting to edge of "sofa", then forward lean, 3 count to stand upright, then slowly sit.  Verbal, visual cues and repeated practice x at least 10 reps.  Cues provided for lateral weigthshfiting for improved ease of scooting -pt tends to revert to her quick, multiple small forward scoots.  After blocked practice (with improvement noted with practice), PT asks pt what she can take from this practice to improve sofa transfers from home; she replies, "I'm not sure, maybe reach out and pull up from something."    Comments  Reviewed HEP for sit<>stand, with pt return demo understanding, cues for slowed descent and for upright posture upon standing.      Ambulation/Gait   Ambulation/Gait  Yes    Ambulation/Gait Assistance  5: Supervision    Ambulation/Gait Assistance Details  BUEs held in extension, dystonic pattern,  with cues provided (verbal and tactile) to relax UEs and for reciprocal arm swing.    Ambulation Distance (Feet)  230 Feet   80   Assistive device  None    Gait Pattern  Step-through pattern;Decreased arm swing - right;Decreased arm swing - left;Decreased dorsiflexion - right;Decreased dorsiflexion - left    Ambulation Surface  Level;Indoor    Gait Comments  Improved attention to heelstrike and foot clearance today; pt continues to have side to side head motions with gait, cues provided to look ahead for visual target.      Self-Care   Self-Care  Other Self-Care Comments    Other Self-Care Comments   Discussed pt's goals, POC, needing to assess goals this visits.  Discussed pt's order from Dr. Burney Gauze for OT/PT  for her R wrist, and explained options for OT at this center, OT where she got her splint and that PT would continue to work on balance, fall prevention.  She agrees to schedule OT eval here at our clinic to address wrist pain (R) until clearance is given for L wrist.       Bed mobility, per patient's request:  Pt demo how she gets into bed-elevated mat (almost to top setting), with pt stepping up onto 4" block then crawling onto elevated mat, and turning uncontrolled to lie semi-supine/sidelying.  She pushes through forearms and keeps neck off mat to help scoot, with straight legs into position.  When she goes from supine to sit, she lifts bilateral extended legs and trunk (v-position) to long sit, then sits EOM and places feet on ground to stand.  Discussed decreased safety and overall risk of injury and ineffiency with above bed mobility technique.  PT demonstrates and then assists x 2, for patient to perform the following:  Step onto 4"step and sit EOM, then with rocking momentum sit>sidelying>supine.  For supine>sit, with knees in hooklying position, using momentum of upper and lower trunk rotation for supine>sidelying>sit EOM.  Pt demonstrates overall ease of mobility with this technique, but seems reluctant to change bed mobility technique.      PT Education - 07/23/19 1827    Education Details  POC, progress towards goals, scheduling OT    Person(s) Educated  Patient    Methods  Explanation;Handout    Comprehension  Verbalized understanding       PT Short Term Goals - 07/23/19 8341      PT SHORT TERM GOAL #1   Title  Pt will be independent with HEP to address balance, transfers, gait.  TARGET 07/05/2019 (EXTENDED due to pt missing appointments for L wrist surgery):  new target 07/26/2019    Time  2    Period  Weeks    Status  Achieved      PT SHORT TERM GOAL #2   Title  Pt will perform sit<>stand at least 8 of 10 reps from low surfaces, simulating sofa, with no posterior lean,  modified independently.    Baseline  1 episode of posterior lean, cues throughout practice    Time  2    Period  Weeks    Status  Partially Met      PT SHORT TERM GOAL #3   Title  Pt will verbalize understanding of fall prevention in home environment.    Baseline  Unable to fully address, due to pt's ongoing questions being addressed during therapy sessions    Time  2    Period  Weeks    Status  On-going  PT Long Term Goals - 07/17/19 0948      PT LONG TERM GOAL #1   Title  Pt will be independent with HEP for Parkinson's specific exercises, to address posture, transfers, balance, gait.  (Extended due to pt missing appts for L wrist surgery, New target:  08/23/2019)    Time  6    Period  Weeks    Status  On-going      PT LONG TERM GOAL #2   Title  Pt will perform TUG and TUG cognitive within 10% time difference, for improved ability for dual tasking with gait.    Time  6    Period  Weeks    Status  On-going      PT LONG TERM GOAL #3   Title  Pt will improve MiniBESTest score to at least 25/28 for improved balance, decreased fall risk.    Time  6    Period  Weeks    Status  Revised      PT LONG TERM GOAL #4   Title  Pt will verbalize plans for ongoing community fitness upon d/c from PT.    Time  6    Period  Weeks    Status  On-going            Plan - 07/23/19 1830    Clinical Impression Statement  STGs assessed this visit, with pt meeting STG 1, as she is able to return demo initial HEP.  STG 2 partially met, with low surface transfers, as she has one episode of posterior lean.  STG ongoing, as pt's request for activities and multiple questions throughout session have made it difficult to fully discuss fall prevention.  When pt has asked each session about the U-step walker, PT has responded that due to her fall risk and hx of falls, U-step walker would be beneficial to reduce fall risk (when LUE is cleared by MD).  Additional focus is on bed mobility, per  patient's request; pt demo decreased safety awareness and difficulty with bed mobility techniques, reluctant to utilize education/techniques practiced in PT today for improved ease of bed mobility.  She will beenfit from continued therapy, with focus next session on standing balance and gait.    Personal Factors and Comorbidities  Behavior Pattern;Comorbidity 3+    Comorbidities  osteoporosis, R wrist fracture (11/2018), L wrist fracture (04/22/2019), hx of rib fracture    Examination-Activity Limitations  Stand;Stairs;Transfers;Locomotion Level    Examination-Participation Restrictions  Community Activity   Exercise   Stability/Clinical Decision Making  Evolving/Moderate complexity    Rehab Potential  Good    PT Frequency  2x / week    PT Duration  6 weeks   per recert 7/36/6815   PT Treatment/Interventions  ADLs/Self Care Home Management;Gait training;Stair training;Functional mobility training;Therapeutic activities;Therapeutic exercise;Balance training;Patient/family education;Neuromuscular re-education;DME Instruction    PT Next Visit Plan  Invite husband into session for improved carryover with PT recommendations; fall prevention; hip/ankle/step strategies, postural stability and attention to gait; gait training, turns    Consulted and Agree with Plan of Care  Patient       Patient will benefit from skilled therapeutic intervention in order to improve the following deficits and impairments:  Abnormal gait, Decreased balance, Decreased mobility, Decreased knowledge of precautions, Decreased coordination, Difficulty walking, Decreased safety awareness, Decreased strength, Postural dysfunction  Visit Diagnosis: Other abnormalities of gait and mobility  Unsteadiness on feet  Abnormal posture  Other symptoms and signs involving the nervous system  Problem List Patient Active Problem List   Diagnosis Date Noted  . Vitamin D deficiency 07/10/2019  . Vitamin B12 deficiency  07/10/2019  . Hypothyroidism   . Parkinson's disease ([place])   . Frequent falls   . Osteoporosis     Kelly Howard W. 07/23/2019, 6:36 PM  Frazier Butt., PT   [facility] [address] [place], [place], [address] Phone: [phone]   Fax:  [phone]  Name: Kelly Howard MRN: [id number] Date of Birth: 07-28-1950

## 2019-07-23 NOTE — Therapy (Signed)
[facility] [address] [place], [place], [address] Phone: [phone]   Fax:  [phone]  Speech Language Pathology Treatment  Patient Details  Name: Kelly Howard MRN: [id number] Date of Birth: 1951/02/23 Referring Provider (SLP): Eldridge Abrahams, MD; pt asked for cc: to [name], DO   Encounter Date: 07/23/2019  End of Session - 07/23/19 1620    Visit Number  7    Number of Visits  17    Date for SLP Re-Evaluation  07/26/19    SLP Start Time  1021    SLP Stop Time   1100    SLP Time Calculation (min)  39 min    Activity Tolerance  Patient tolerated treatment well       Past Medical History:  Diagnosis Date  . Frequent falls   . Hypothyroidism   . Osteoporosis   . Parkinson's disease [facility])     Past Surgical History:  Procedure Laterality Date  . TONSILLECTOMY    . WRIST SURGERY      There were no vitals filed for this visit.  Subjective Assessment - 07/23/19 1027    Subjective  Pt with same amount of dyskenisias as last session.    Currently in Pain?  Yes    Pain Score  2     Pain Location  Arm    Pain Orientation  Right;Left    Pain Descriptors / Indicators  Sore            ADULT SLP TREATMENT - 07/23/19 1036      General Information   Behavior/Cognition  Alert;Cooperative;Decreased sustained attention      Treatment Provided   Treatment provided  Cognitive-Linquistic      Cognitive-Linquistic Treatment   Treatment focused on  Cognition    Skilled Treatment  Pt with tangential questioning throughout ST session despite SLP telling pt SLP unable to answer questions about pt's specific mental health ("feeling nervous and anxious") and how it relates to her Parkinson's, and questions related to medications. SLP suggested pt write down these questions to ask her PCP, neurologist, or mental health professional. SLP assisted pt reason through why she needed to order a walker, as  pt questioning why she needed one. I read Dr. Doristine Devoid note from 06-10-19 and pt wrote down a reminder to order U-Step walker. Pt then asked SLP if this was the best walker for her "because it's so heavy - How could I ever lift it into the car?" SLP talked with pt about if her lifting it into the car would be the safest option for her. Pt had difficulty reasoning why she couldn't lift it into the car. After careful discussion with SLP, pt ultimately agreed that it would not be wise for her to lift the walker into her vehicle if she were needing the walker due to being unsteady. SLP had pt produce loud /a/ to try to focus pt on a task. Pt with an instantaneous drastic shift in largeness of amplitude of trunk dyskenisias -almost knocking over sound level meter with her head. Pt was occasionally successful with maintaining WNL posture with VC to "feel your back on the chair back" but after 3-4 seconds pt would again begin large amplitude dyskenisias with her trunk. SLP talked with pt about maybe putting ST on hold and focusing therapies on what was most crucial for her, safety-wise, including PT (balance/gait) and OT (wrist). Pt agreed it has been more difficult for her to attend to  ST given her incr'd anxiety (pt then asked SLP about whether or not this was due to PD). SLP cancelled her remaining 4-5 ST visits and agreed with pt to check on how her attention is in approx one month, via asking her PT and OT. Pt agreed with this.       Assessment / Recommendations / Plan   Plan Pause ST for approx 4 weeks due to decr'd attention, and to focus on OT/PT. SLP to confer with PT/OT after that time to assess pt readiness to resume ST     Progression Toward Goals   Progression toward goals  Not progressing toward goals (comment)   decr'd attention       SLP Education - 07/23/19 1620    Education Details  taking a break from [place] until attention improves    Person(s) Educated  Patient    Methods  Explanation     Comprehension  Verbalized understanding       SLP Short Term Goals - 06/28/19 0922      SLP SHORT TERM GOAL #1   Title  pt will demo selective attention to therapy tasks for 10 minutes in min noisy environment over two sessions    Baseline  06-24-19, 06-28-19    Status  Achieved      SLP SHORT TERM GOAL #2   Title  pt will demo improved awareness by double checking answers/work in therapy tasks 100% of the time over three sessions    Status  Partially Met      SLP [place] #3   Title  pt will demo functional reasoning/problem solving skills in simple functional linguistic tasks in 3 therapy sessions    Baseline  06-24-19    Status  Partially Met      SLP SHORT TERM GOAL #4   Title  pt will complete full cognitive-linguistic testing    Status  Achieved      SLP SHORT TERM GOAL #5   Title  pt will improve speech volume to upper 60s - low 70s dB in 5 minutes simple conversation x3 sessions    Time  1    Period  Weeks    Status  Deferred       SLP Long Term Goals - 07/23/19 1623      SLP LONG TERM GOAL #1   Title  pt will demonstrate WFL selective atteniton in min-mod noisy environment for 15 mintue long therapy task x3 sessions    Time  4    Period  Weeks   or 17 sessions, for all LTGs   Status  On-going   All LTGs on hold beginning 07-23-19     SLP LONG TERM GOAL #2   Title  pt will demo improved problem solving ability in lingustic problem solving tasks with rare min A over three sessions    Time  4    Period  Weeks    Status  On-going      SLP LONG TERM GOAL #3   Title  pt will improve speech volume to upper 60s - low 70s dB in 10 minutes simple conversation x3 sessions    Time  4    Period  Weeks    Status  On-going       Plan - 07/23/19 1621    Clinical Impression Statement  Pt presents with cognitive communicaiton deficits demonstrated in the areas of attention, awareness (primarily safety awareness), and problem solving complicated by pt's high level  of  impulsivity. At this time, SLP and pt agreed that due to decr'd attention pt will be put on hold for ST (see "skilled treatment"). SLP to check on pt's attention skills in approx 4 weeks by conferring with pt's PT (and OT) and assessing at that time if pt's attention skills would be adequate for a restart of ST.    Speech Therapy Frequency  Other (comment)   on hold for approx 4 weeks (07-23-19)   Treatment/Interventions  Environmental controls;Compensatory techniques;Cueing hierarchy;Cognitive reorganization;Internal/external aids;Patient/family education;SLP instruction and feedback;Functional tasks    Potential to Achieve Goals  Fair    Potential Considerations  Ability to learn/carryover information;Severity of impairments       Patient will benefit from skilled therapeutic intervention in order to improve the following deficits and impairments:   Cognitive communication deficit  Dysarthria and anarthria    Problem List Patient Active Problem List   Diagnosis Date Noted  . Vitamin D deficiency 07/10/2019  . Vitamin B12 deficiency 07/10/2019  . Hypothyroidism   . Parkinson's disease ([place])   . Frequent falls   . Osteoporosis     Raeleigh Guinn ,MS, CCC-SLP  07/23/2019, 4:25 PM  [facility] [address] [place], [place], [address] Phone: [phone]   Fax:  [phone]   Name: Mosella Kasa MRN: [id number] Date of Birth: 12/04/1950

## 2019-07-25 ENCOUNTER — Ambulatory Visit: Payer: Medicare PPO

## 2019-07-25 ENCOUNTER — Ambulatory Visit: Payer: Medicare PPO | Admitting: Physical Therapy

## 2019-07-25 ENCOUNTER — Other Ambulatory Visit: Payer: Self-pay

## 2019-07-25 ENCOUNTER — Ambulatory Visit: Payer: Medicare PPO | Attending: Internal Medicine

## 2019-07-25 DIAGNOSIS — R2689 Other abnormalities of gait and mobility: Secondary | ICD-10-CM

## 2019-07-25 DIAGNOSIS — R2681 Unsteadiness on feet: Secondary | ICD-10-CM

## 2019-07-25 DIAGNOSIS — Z23 Encounter for immunization: Secondary | ICD-10-CM

## 2019-07-25 DIAGNOSIS — R293 Abnormal posture: Secondary | ICD-10-CM

## 2019-07-25 NOTE — Therapy (Signed)
[facility] [address] [place], [place], [address] Phone: [phone]   Fax:  [phone]  Physical Therapy Treatment  Patient Details  Name: Kelly Howard MRN: [id number] Date of Birth: Jun 25, 1950 Referring Provider (PT): Siddiqui (Pt is seeing Dr. Carles Collet for first visit 05/01/2019)   Encounter Date: 07/25/2019  PT End of Session - 07/25/19 1334    Visit Number  9    Number of Visits  17    Date for PT Re-Evaluation  19/37/90   per recert 2/40/9735   Authorization Type  [place] request completed 06/24/2019    PT Start Time  1020    PT Stop Time  1102    PT Time Calculation (min)  42 min    Activity Tolerance  Patient tolerated treatment well    Behavior During Therapy  Restless;Anxious   fidgety throughout session, asks multiple questions; needs frequent redirection      Past Medical History:  Diagnosis Date  . Frequent falls   . Hypothyroidism   . Osteoporosis   . Parkinson's disease [facility])     Past Surgical History:  Procedure Laterality Date  . TONSILLECTOMY    . WRIST SURGERY      There were no vitals filed for this visit.  Subjective Assessment - 07/25/19 1021    Subjective  No falls since last visit.  Feel I'm doing a little better.    Patient Stated Goals  Pt's goal for therapy is to walk without fear of falling.    Currently in Pain?  Yes    Pain Score  4     Pain Location  Wrist    Pain Orientation  Right    Pain Descriptors / Indicators  Sore    Pain Type  Chronic pain    Pain Onset  More than a month ago    Pain Frequency  Intermittent    Aggravating Factors   first waking up    Pain Relieving Factors  Ibuprofen                       [facility] Adult PT Treatment/Exercise - 07/25/19 0001      Ambulation/Gait   Ambulation/Gait  Yes    Ambulation/Gait Assistance  5: Supervision    Ambulation Distance (Feet)  80 Feet   x 2; 40 ft x 2   Assistive device   None    Gait Pattern  Step-through pattern;Decreased arm swing - right;Decreased arm swing - left;Decreased dorsiflexion - right;Decreased dorsiflexion - left    Ambulation Surface  Level;Indoor    Stairs  Yes    Stairs Assistance  6: Modified independent (Device/Increase time)    Stairs Assistance Details (indicate cue type and reason)  Cues for slowed pace; pt much slower, more controlled than last attempt at stairs.    Stair Management Technique  One rail Right;Alternating pattern;Forwards    Number of Stairs  4   x 3 reps   Height of Stairs  6      Self-Care   Self-Care  Other Self-Care Comments    Other Self-Care Comments   Fall prevention education provided-including lighting, bathroom safety, stair safety, removing rugs.  Also, pt has questions about use of her stationary ([employer]?) bike at home.  Recommend that she use RUE on handle or have secure UE support when using (pt currently riding 1-2 miles at a time; requested pt report what her typical speed is so we can  talk about intensity next visit)          Balance Exercises - 07/25/19 1047      Balance Exercises: Standing   Wall Bumps  Hip    Wall Bumps-Hips  Anterior/posterior;Eyes opened;10 reps   At counter   Stepping Strategy  Anterior;Posterior;Lateral;UE support;10 reps   Cues for deliberate step and weightshift, return to midline   Heel Raises Limitations  x 10 reps    Toe Raise Limitations  x 10 reps    Other Standing Exercises  In parallel bars:  with 2" obstacle, additional forward step and weightshift x 10 reps, then side step and weightshift x 10 reps; pt able to consistently lift each leg to avoid hitting obstacle throughout.      Performed standing PWR! Up x 10 reps, cues for decreased UE support at counter, for correct technique with squatting and with posture.  PWR! Rock x 10 reps each side, with cues for optimal lateral weightshifting, then added reach to counter.  Postural cues provided throughout standing  exercises.   PT Education - 07/25/19 1333    Education Details  Fall prevention, additions to [employer]) Educated  Patient    Methods  Explanation;Demonstration;Verbal cues;Handout    Comprehension  Verbalized understanding;Returned demonstration;Verbal cues required       PT Short Term Goals - 07/23/19 0952      PT SHORT TERM GOAL #1   Title  Pt will be independent with HEP to address balance, transfers, gait.  TARGET 07/05/2019 (EXTENDED due to pt missing appointments for L wrist surgery):  new target 07/26/2019    Time  2    Period  Weeks    Status  Achieved      PT SHORT TERM GOAL #2   Title  Pt will perform sit<>stand at least 8 of 10 reps from low surfaces, simulating sofa, with no posterior lean, modified independently.    Baseline  1 episode of posterior lean, cues throughout practice    Time  2    Period  Weeks    Status  Partially Met      PT SHORT TERM GOAL #3   Title  Pt will verbalize understanding of fall prevention in home environment.    Baseline  Unable to fully address, due to pt's ongoing questions being addressed during therapy sessions    Time  2    Period  Weeks    Status  On-going        PT Long Term Goals - 07/17/19 0948      PT LONG TERM GOAL #1   Title  Pt will be independent with HEP for Parkinson's specific exercises, to address posture, transfers, balance, gait.  (Extended due to pt missing appts for L wrist surgery, New target:  08/23/2019)    Time  6    Period  Weeks    Status  On-going      PT LONG TERM GOAL #2   Title  Pt will perform TUG and TUG cognitive within 10% time difference, for improved ability for dual tasking with gait.    Time  6    Period  Weeks    Status  On-going      PT LONG TERM GOAL #3   Title  Pt will improve MiniBESTest score to at least 25/28 for improved balance, decreased fall risk.    Time  6    Period  Weeks    Status  Revised  PT LONG TERM GOAL #4   Title  Pt will verbalize plans for ongoing  community fitness upon d/c from PT.    Time  6    Period  Weeks    Status  On-going            Plan - 07/25/19 1334    Clinical Impression Statement  Pt overall more slowed and controlled with standing balance and stair negotiation exercises this visit.  Provided education and discussed fall prevention for home environment.  Provided additions to HEP today for forward and side step and weightshift for balance strategy.  Will continue to work in posterior direction to add to HEP when appropriate.    Personal Factors and Comorbidities  Behavior Pattern;Comorbidity 3+    Comorbidities  osteoporosis, R wrist fracture (11/2018), L wrist fracture (04/22/2019), hx of rib fracture    Examination-Activity Limitations  Stand;Stairs;Transfers;Locomotion Level    Examination-Participation Restrictions  Community Activity   Exercise   Stability/Clinical Decision Making  Evolving/Moderate complexity    Rehab Potential  Good    PT Frequency  2x / week    PT Duration  6 weeks   per recert 10/26/509   PT Treatment/Interventions  ADLs/Self Care Home Management;Gait training;Stair training;Functional mobility training;Therapeutic activities;Therapeutic exercise;Balance training;Patient/family education;Neuromuscular re-education;DME Instruction    PT Next Visit Plan  10th visit progress report next visit; Invite husband into session for improved carryover with PT recommendations; review fall prevention and updates to HEP; hip/ankle/step strategies, postural stability and attention to gait; gait training, turns    Consulted and Agree with Plan of Care  Patient       Patient will benefit from skilled therapeutic intervention in order to improve the following deficits and impairments:  Abnormal gait, Decreased balance, Decreased mobility, Decreased knowledge of precautions, Decreased coordination, Difficulty walking, Decreased safety awareness, Decreased strength, Postural dysfunction  Visit  Diagnosis: Other abnormalities of gait and mobility  Unsteadiness on feet  Abnormal posture     Problem List Patient Active Problem List   Diagnosis Date Noted  . Vitamin D deficiency 07/10/2019  . Vitamin B12 deficiency 07/10/2019  . Hypothyroidism   . Parkinson's disease ([place])   . Frequent falls   . Osteoporosis     Aujanae Mccullum W. 07/25/2019, 1:41 PM  Frazier Butt., PT   [facility] [address] [place], [place], [address] Phone: [phone]   Fax:  [phone]  Name: Kelly Howard MRN: [id number] Date of Birth: 09/26/1950

## 2019-07-25 NOTE — Patient Instructions (Addendum)
It is important to avoid accidents which may result in broken bones.  Here are a few ideas on how to make your home safer so you will be less likely to trip or fall.  1. Use nonskid mats or non slip strips in your shower or tub, on your bathroom floor and around sinks.  If you know that you have spilled water, wipe it up! 2. In the bathroom, it is important to have properly installed grab bars on the walls or on the edge of the tub.  Towel racks are NOT strong enough for you to hold onto or to pull on for support. 3. Stairs and hallways should have enough light.  Add lamps or night lights if you need ore light. 4. It is good to have handrails on both sides of the stairs if possible.  Always fix broken handrails right away. 5. It is important to see the edges of steps.  Paint the edges of outdoor steps white so you can see them better.  Put colored tape on the edge of inside steps. 6. Throw-rugs are dangerous because they can slide.  Removing the rugs is the best idea, but if they must stay, add adhesive carpet tape to prevent slipping. 7. Do not keep things on stairs or in the halls.  Remove small furniture that blocks the halls as it may cause you to trip.  Keep telephone and electrical cords out of the way where you walk. 8. Always were sturdy, rubber-soled shoes for good support.  Never wear just socks, especially on the stairs.  Socks may cause you to slip or fall.  Do not wear full-length housecoats as you can easily trip on the bottom.  9. Place the things you use the most on the shelves that are the easiest to reach.  If you use a stepstool, make sure it is in good condition.  If you feel unsteady, DO NOT climb, ask for help. 10. If a health professional advises you to use a cane or walker, do not be ashamed.  These items can keep you from falling and breaking your bones.   ALSO: Provided handout for forward step and weightshift x 10 reps; side step and weightshift x 10 reps-holding onto  counter for support

## 2019-07-26 ENCOUNTER — Other Ambulatory Visit: Payer: Self-pay

## 2019-07-29 ENCOUNTER — Ambulatory Visit (INDEPENDENT_AMBULATORY_CARE_PROVIDER_SITE_OTHER): Payer: Medicare PPO | Admitting: *Deleted

## 2019-07-29 ENCOUNTER — Other Ambulatory Visit: Payer: Self-pay

## 2019-07-29 DIAGNOSIS — E538 Deficiency of other specified B group vitamins: Secondary | ICD-10-CM | POA: Diagnosis not present

## 2019-07-29 MED ORDER — CYANOCOBALAMIN 1000 MCG/ML IJ SOLN
1000.0000 ug | Freq: Once | INTRAMUSCULAR | Status: AC
  Administered 2019-07-29: 1000 ug via INTRAMUSCULAR

## 2019-07-29 NOTE — Progress Notes (Signed)
Per orders of Dr. Philip Aspen, injection of 3rd B 12 given by Jobe Gibbon. Patient tolerated injection well.

## 2019-07-30 ENCOUNTER — Ambulatory Visit: Payer: Medicare PPO

## 2019-07-31 DIAGNOSIS — F4323 Adjustment disorder with mixed anxiety and depressed mood: Secondary | ICD-10-CM | POA: Diagnosis not present

## 2019-08-03 ENCOUNTER — Other Ambulatory Visit: Payer: Self-pay | Admitting: Neurology

## 2019-08-05 ENCOUNTER — Other Ambulatory Visit: Payer: Self-pay

## 2019-08-05 ENCOUNTER — Ambulatory Visit (INDEPENDENT_AMBULATORY_CARE_PROVIDER_SITE_OTHER): Payer: Medicare PPO | Admitting: *Deleted

## 2019-08-05 ENCOUNTER — Telehealth: Payer: Self-pay | Admitting: Internal Medicine

## 2019-08-05 DIAGNOSIS — E538 Deficiency of other specified B group vitamins: Secondary | ICD-10-CM | POA: Diagnosis not present

## 2019-08-05 MED ORDER — CYANOCOBALAMIN 1000 MCG/ML IJ SOLN
1000.0000 ug | Freq: Once | INTRAMUSCULAR | Status: AC
  Administered 2019-08-05: 1000 ug via INTRAMUSCULAR

## 2019-08-05 NOTE — Progress Notes (Signed)
Per orders of Dr. Philip Aspen, injection of 4th B 12 given by Jobe Gibbon. Patient tolerated injection well.

## 2019-08-05 NOTE — Telephone Encounter (Signed)
Patient needs a call back to help clarify details on taking B12.  She is now on monthly B12 shots and has an appointment made for her next shot and patient also has an appt made for a 6 mth f/u.  Patient needs clarification on how long to take her prescription Vitamin D, when to start OTC Vitamin D and how much of a dose, and when does the patient need to come in for her next Vitamin D labs.

## 2019-08-06 ENCOUNTER — Encounter: Payer: Medicare PPO | Admitting: Psychology

## 2019-08-06 ENCOUNTER — Other Ambulatory Visit: Payer: Self-pay | Admitting: Internal Medicine

## 2019-08-06 ENCOUNTER — Telehealth: Payer: Self-pay | Admitting: Neurology

## 2019-08-06 DIAGNOSIS — E559 Vitamin D deficiency, unspecified: Secondary | ICD-10-CM

## 2019-08-06 NOTE — Telephone Encounter (Signed)
Patient called to see if Dr. Arbutus Leas had a chance to address her concerns.

## 2019-08-06 NOTE — Telephone Encounter (Signed)
Spoke with patient and reviewed lab results. 

## 2019-08-06 NOTE — Telephone Encounter (Signed)
Patient called regarding her medication Carbidopa Levodopa. She said she woke up this morning around 4:30 AM with her toes curling up. She took a 1/2 of a Carbidopa Levodopa and then at 7:50 took a whole pill. She is asking if she should take another Carbidopa at 9:00 AM? Please call. Thank you

## 2019-08-06 NOTE — Telephone Encounter (Signed)
Confirm dosages of carbidopa/levodopa and exactly how she is taking them.  She should be on:   Carbidopa/levodopa 25/100, 1.5 tablet at 6 AM/9 AM/noon/3 PM/6 PM  2.  Carbidopa/levodopa 25/100 CR at bedtime  She can ask the front desk about reschedule for her previous June appt

## 2019-08-06 NOTE — Telephone Encounter (Signed)
Left message on machine for patient returning her call 

## 2019-08-06 NOTE — Telephone Encounter (Signed)
Spoke with patient and she stated she did not cancel her follow up appt with our office. She states she told Kelly Howard the Speech therapist to cancel her appointments with him and he canceled her appt with Kelly Howard. She wants to reschedule. She states that she needs carbidopa levodopa and klonopin. Because she is waking up sooner than usual. She wants to get her blue pill. I told her I was not sure of the blue pill she is talking about, and she stated Kelly Howard would know what she means. She stated she can not walk because her toes are curling.   Reviewed her chart and informed patient that we refilled her Carbidopa levodopa was refills yesterday. She said she needs something else because her toes are curling and she cant stand. She states she needs something but she does not know what it is. She states that maybe the Carbidopa levodopa is not working and that's why her toes are curling. She wants to know what causing her toes to curl.   Told patient that I would speak with Kelly Howard and get back to her. She requested that I call her back as soon as I can with Kelly Howard recommendations.

## 2019-08-06 NOTE — Telephone Encounter (Signed)
Is this normal (toes curling), and how many hours apart is the patient supposed to take the Carbidopa-Levodopa apart?

## 2019-08-06 NOTE — Telephone Encounter (Signed)
Left message for patient to contact office.

## 2019-08-06 NOTE — Telephone Encounter (Signed)
Pt has cancelled all future appts with me (I know this because I declined refill of her klonopin yesterday).  Where is she planning on following up?  Per June cancellation notes, she didn't want a follow up and wanted to focus on therapy?  If she is going to follow up here, she will need appointment scheduled.  I cannot just give advice over the phone without appts in the future.

## 2019-08-07 ENCOUNTER — Telehealth: Payer: Self-pay | Admitting: Neurology

## 2019-08-07 ENCOUNTER — Other Ambulatory Visit: Payer: Self-pay | Admitting: Neurology

## 2019-08-07 NOTE — Telephone Encounter (Signed)
Spoke with patient and reviewed Cabidopa/Levodopa instructions with patient and she confirmed that she is taking the medication as prescribed.  She was crying on the phone stating she needs a refill on her Zoloft.   She has rescheduled her appt with Dr Arbutus Leas for November 07, 2019.

## 2019-08-07 NOTE — Telephone Encounter (Signed)
Continue to take carbidopa/levodopa 25/100 as prescribed except she can stop the bedtime carbidopa/levodopa 25/100 CR and instead start carbidopa/levodopa 50/200 CR at bedtime.  Please change that on her med list as well as she gets confused easily.  Re: sertraline, I don't prescribe that to her.  Her old neurologist may have.  However, they tried many antidepressants and she is very complicated in that regard and out of my field of expertise.  She will either need to f/u with PCP or need psychiatry.  Happy to refer her for that care as many Parkinsons Disease patients need that.

## 2019-08-07 NOTE — Telephone Encounter (Signed)
Left message for patient to call back  

## 2019-08-07 NOTE — Telephone Encounter (Signed)
OPENED IN ERROR

## 2019-08-07 NOTE — Telephone Encounter (Signed)
Left message to call back regarding her Sertraline request.  Per last note Dr Tat advises patient to contact PCP or needs a psych referral for refill.

## 2019-08-07 NOTE — Telephone Encounter (Signed)
Patient is needing a refill on her Sertraline 50 MG Tablets. She uses [facility] on [address]. She is scheduled for 11/07/19 top follow up with Dr. Arbutus Leas. Thank you

## 2019-08-08 ENCOUNTER — Telehealth: Payer: Self-pay | Admitting: Internal Medicine

## 2019-08-08 ENCOUNTER — Ambulatory Visit: Payer: Medicare PPO | Attending: Obstetrics and Gynecology | Admitting: Occupational Therapy

## 2019-08-08 ENCOUNTER — Ambulatory Visit: Payer: Medicare PPO | Admitting: Physical Therapy

## 2019-08-08 ENCOUNTER — Encounter: Payer: Self-pay | Admitting: Physical Therapy

## 2019-08-08 ENCOUNTER — Other Ambulatory Visit: Payer: Self-pay

## 2019-08-08 DIAGNOSIS — M25631 Stiffness of right wrist, not elsewhere classified: Secondary | ICD-10-CM | POA: Diagnosis not present

## 2019-08-08 DIAGNOSIS — R29818 Other symptoms and signs involving the nervous system: Secondary | ICD-10-CM

## 2019-08-08 DIAGNOSIS — M25632 Stiffness of left wrist, not elsewhere classified: Secondary | ICD-10-CM | POA: Insufficient documentation

## 2019-08-08 DIAGNOSIS — M6281 Muscle weakness (generalized): Secondary | ICD-10-CM

## 2019-08-08 DIAGNOSIS — R41840 Attention and concentration deficit: Secondary | ICD-10-CM | POA: Diagnosis not present

## 2019-08-08 DIAGNOSIS — R2681 Unsteadiness on feet: Secondary | ICD-10-CM

## 2019-08-08 DIAGNOSIS — R2689 Other abnormalities of gait and mobility: Secondary | ICD-10-CM

## 2019-08-08 DIAGNOSIS — R293 Abnormal posture: Secondary | ICD-10-CM | POA: Insufficient documentation

## 2019-08-08 DIAGNOSIS — R278 Other lack of coordination: Secondary | ICD-10-CM | POA: Insufficient documentation

## 2019-08-08 MED ORDER — SERTRALINE HCL 50 MG PO TABS: ORAL_TABLET | ORAL | 0 refills | Status: DC

## 2019-08-08 MED ORDER — CARBIDOPA-LEVODOPA ER 50-200 MG PO TBCR: 1.0000 | EXTENDED_RELEASE_TABLET | Freq: Every day | ORAL | 1 refills | Status: DC

## 2019-08-08 MED ORDER — CARBIDOPA-LEVODOPA 25-100 MG PO TABS: 1.5000 | ORAL_TABLET | Freq: Four times a day (QID) | ORAL | 0 refills | Status: DC

## 2019-08-08 NOTE — Telephone Encounter (Signed)
Medication Refill: Sertaline  Pharmacy: [facility] [address] [facility] [place]  Phone:

## 2019-08-08 NOTE — Therapy (Signed)
[facility] [address] [place], [place], [address] Phone: [phone]   Fax:  [phone]  Occupational Therapy Evaluation  Patient Details  Name: Kelly Howard MRN: [id number] Date of Birth: 1951-05-06 Referring Provider (OT): Dr.Tat, Dr. Rubin Payor   Encounter Date: 08/08/2019  OT End of Session - 08/08/19 1659    Visit Number  1    Number of Visits  17    Date for OT Re-Evaluation  10/07/19    Authorization Type  [employer]    Authorization - Visit Number  1    Authorization - Number of Visits  10    Progress Note Due on Visit  10    OT Start Time  1450    OT Stop Time  1530    OT Time Calculation (min)  40 min    Activity Tolerance  Patient tolerated treatment well    Behavior During Therapy  Impulsive;Restless       Past Medical History:  Diagnosis Date  . Frequent falls   . Hypothyroidism   . Osteoporosis   . Parkinson's disease [facility])     Past Surgical History:  Procedure Laterality Date  . TONSILLECTOMY    . WRIST SURGERY      There were no vitals filed for this visit.  Subjective Assessment - 08/08/19 1458    Currently in Pain?  Yes    Pain Score  3     Pain Location  Wrist    Pain Orientation  Left    Pain Descriptors / Indicators  Aching    Pain Type  Acute pain    Pain Onset  More than a month ago    Pain Frequency  Intermittent    Aggravating Factors   malpositioning    Pain Relieving Factors  repositioning        [facility] OT Assessment - 08/08/19 0001      Assessment   Medical Diagnosis  Parkinson's disease    Referring Provider (OT)  Dr.Tat, Dr. Rubin Payor    Onset Date/Surgical Date  --   January 2020     Precautions   Precautions  Fall    Precaution Comments  L wrist fracture, has been wearing wrist brace some, no restrictions for RUE, will need to clarify precautions with MD for LUE, pt is 4 weeks post op.      Restrictions   Weight Bearing Restrictions  Yes    LUE  Weight Bearing  --   wear brace with heavier activities.     Balance Screen   Has the patient fallen in the past 6 months  Yes    How many times?  "at least 2"    Has the patient had a decrease in activity level because of a fear of falling?   Yes    Is the patient reluctant to leave their home because of a fear of falling?   No      Home  Environment   Family/patient expects to be discharged to:  Private residence    Living Arrangements  Spouse/significant other    Type of Home  House    Home Layout  Two level    Bathroom Shower/Tub  Walk-in Shower    Lives With  Spouse      Prior Function   Level of Independence  Independent    Leisure  Has participated in [place] classes and PD dance classes in the past; Has kept grandchildren in the past  ADL   Eating/Feeding  Needs assist with cutting food   increased spills    Grooming  Modified independent   difficulty applying makeup   Upper Body Bathing  Modified independent    Lower Body Bathing  Modified independent    Upper Body Dressing  Increased time   difficulty with buttons   Lower Body Dressing  Increased time    Toilet Transfer  Modified independent    Tub/Shower Transfer  Modified independent      IADL   Shopping  Needs to be accompanied on any shopping trip    Light Housekeeping  Performs light daily tasks such as dishwashing, bed making    Meal Prep  Able to complete simple warm meal prep    Medication Management  Takes responsibility if medication is prepared in advance in seperate dosage      Mobility   Mobility Status  History of falls;Independent      Written Expression   Dominant Hand  Right    Handwriting  100% legible;Mild micrographia   Pt's handwriting is less legible at times.     Cognition   Area of Impairment  Attention;Safety/judgement;Awareness;Problem solving    Attention Comments  sustained/selective attention decr'd as pt consistently demonstrated impulsivity    Safety and Judgement  Comments  --    Awareness Comments  safety awareness primarily; decr'd attention and presence of impulsivity play a role in this    Memory  Impaired    Memory Impairment  Decreased short term memory    Behaviors  Impulsive;Restless      Observation/Other Assessments   Other Surveys   Select    Physical Performance Test    Yes    Simulated Eating Time (seconds)  12.06    Donning Doffing Jacket Comments  3 button/ unbutton 51.87      Posture/Postural Control   Posture/Postural Control  Postural limitations    Postural Limitations  Rounded Shoulders;Forward head   Trunk lean to right, head tilted R and rotated L   Posture Comments  Trunk/lower extremity dyskinesias noted during eval;      Coordination   Gross Motor Movements are Fluid and Coordinated  No    Fine Motor Movements are Fluid and Coordinated  No    9 Hole Peg Test  Right;Left    Right 9 Hole Peg Test  30.19    Left 9 Hole Peg Test  34.14    Box and Blocks  RUE 48 LUE 39    Tremors  present in RUE, pt demonstrates posturing during activity    Coordination  impaired      ROM / Strength   AROM / PROM / Strength  AROM      AROM   AROM Assessment Site  Wrist;Elbow;Shoulder    Right/Left Shoulder  Right;Left    Right Shoulder Flexion  130 Degrees    Left Shoulder Flexion  130 Degrees    Right/Left Elbow  Right;Left    Right Elbow Extension  -20    Left Elbow Extension  -25    Right/Left Wrist  Left    Right Wrist Extension  45 Degrees    Right Wrist Flexion  90 Degrees   supination grossly 90%   Left Wrist Extension  15 Degrees    Left Wrist Flexion  55 Degrees   supination grossly 90%     Strength   Overall Strength Comments  Grossly tested at least 4/5 bilateral lower extremities  OT Short Term Goals - 08/08/19 1509      OT SHORT TERM GOAL #1   Title  I with HEP.    Time  4    Period  Weeks    Status  New    Target Date  09/07/19      OT SHORT TERM GOAL #2    Title  I with adaptes strategies to maximize safety and I with ADLs/ IADLs.    Time  4    Period  Weeks    Status  New      OT SHORT TERM GOAL #3   Title  Pt will demonstrate improved LUE functional use as evidenced by increasing box/ blocks score by 4 blocks.    Baseline  RUE 48, LUE39    Time  4    Period  Weeks    Status  New      OT SHORT TERM GOAL #4   Title  Pt will write a short paragraph with 100% legibility and no significant decrease in letter size.    Time  4    Period  Weeks    Status  New        OT Long Term Goals - 08/08/19 1636      OT LONG TERM GOAL #1   Title  Pt will verbalize understanding of ways to prevent future PD complications.    Time  8    Period  Weeks    Status  New    Target Date  10/07/19      OT LONG TERM GOAL #2   Title  Pt will demonstrate ability to retrieve a lightweight object from overhead shelf with -15 elbow extension with RUE.    Time  8    Period  Weeks    Status  New      OT LONG TERM GOAL #3   Title  Pt will demonstrate ability to retrieve a lightweight object from overhead shelf with -20 elbow extension LUE    Time  8    Period  Weeks    Status  New      OT LONG TERM GOAL #4   Title  Pt will demonstrate wrist extension of at least 50 for bilateral wrists for improved functional use.    Baseline  RUE 45, LUE 15    Time  8    Period  Weeks    Status  New      OT LONG TERM GOAL #5   Title  Pt will demonstrate improved ease with dressing as eveidenced by decreasing 3 button/ unbutton time to 47 secs or less.    Baseline  51.87    Time  8    Period  Weeks    Status  New            Plan - 08/08/19 1656    Clinical Impression Statement  Pt is a 69 yo female,  who presents to [facility] with Parkinson's disease and hx of falls.  She has had at multiple falls  and she with right distal radius fx in July 2020 s/p ORIF and recent left distal radius fx s/p ORIF in Feb 2021. Pt. exhibits posturing of BUEs  She presents with  decreased coordination, cognitive deficits, bradykinesia,decreased ROM, decreased balance, abnormal posture, postural instability, and decreased safety awareness which impedes perfromance of ADLs/ IADLs. Pt can benefit from skilled occupational therapy in order to maximize pt's safety and I with ADLs/ IADLs.  OT Occupational Profile and History  Problem Focused Assessment - Including review of records relating to presenting problem    Occupational performance deficits (Please refer to evaluation for details):  ADL's;IADL's;Leisure;Social Participation    Body Structure / Function / Physical Skills  ADL;Balance;Mobility;Strength;Flexibility;FMC;Coordination;Decreased knowledge of precautions;GMC;ROM;Decreased knowledge of use of DME;Dexterity;IADL    Cognitive Skills  Attention;Emotional;Memory;Problem Solve;Sequencing    Rehab Potential  Good    Clinical Decision Making  Limited treatment options, no task modification necessary    Comorbidities Affecting Occupational Performance:  May have comorbidities impacting occupational performance    Modification or Assistance to Complete Evaluation   No modification of tasks or assist necessary to complete eval    OT Frequency  2x / week    OT Duration  8 weeks   plus eval   OT Treatment/Interventions  Self-care/ADL training;Aquatic Therapy;Building services engineer;Therapeutic exercise;Balance training;Splinting;Manual Therapy;Neuromuscular education;Ultrasound;Therapeutic activities;Energy conservation;Paraffin;DME and/or AE instruction;Cognitive remediation/compensation;Fluidtherapy;Gait Training;Moist Heat;Contrast Bath;Passive range of motion;Patient/family education    Plan  check with MD regarding LUE precautions, PWR! moves, ADL strategies    Consulted and Agree with Plan of Care  Patient       Patient will benefit from skilled therapeutic intervention in order to improve the following deficits and impairments:   Body Structure / Function /  Physical Skills: ADL, Balance, Mobility, Strength, Flexibility, FMC, Coordination, Decreased knowledge of precautions, GMC, ROM, Decreased knowledge of use of DME, Dexterity, IADL Cognitive Skills: Attention, Emotional, Memory, Problem Solve, Sequencing     Visit Diagnosis: Other symptoms and signs involving the nervous system - Plan: Ot plan of care cert/re-cert  Abnormal posture - Plan: Ot plan of care cert/re-cert  Other abnormalities of gait and mobility - Plan: Ot plan of care cert/re-cert  Other lack of coordination - Plan: Ot plan of care cert/re-cert  Stiffness of left wrist, not elsewhere classified - Plan: Ot plan of care cert/re-cert  Stiffness of right wrist, not elsewhere classified - Plan: Ot plan of care cert/re-cert  Muscle weakness (generalized) - Plan: Ot plan of care cert/re-cert  Attention and concentration deficit - Plan: Ot plan of care cert/re-cert    Problem List Patient Active Problem List   Diagnosis Date Noted  . Vitamin D deficiency 07/10/2019  . Vitamin B12 deficiency 07/10/2019  . Hypothyroidism   . Parkinson's disease (HCC)   . Frequent falls   . Osteoporosis     Kelly Howard 08/08/2019, 5:02 PM Keene Breath, OTR/L [phone] Phone: [phone] 5:02 PM 08/08/19 [facility] [address] [place], [place], [address] Phone: [phone]   Fax:  [phone]  Name: Kelly Howard MRN: [id number] Date of Birth: 09/25/1950

## 2019-08-08 NOTE — Therapy (Addendum)
[place] [address] [place], [place], [address] Phone: [phone]   Fax:  [phone]  Physical Therapy Treatment/10th Visit Progress Note  Patient Details  Name: Pinkey Howard MRN: [id number] Date of Birth: Jan 18, 1951 Referring Provider (PT): Siddiqui (Pt is seeing Dr. Carles Collet for first visit 05/01/2019)   Encounter Date: 08/08/2019  PT End of Session - 08/08/19 1606    Visit Number  10    Number of Visits  17    Date for PT Re-Evaluation  50/53/97   per recert 6/73/4193   Authorization Type  [place] request completed 06/24/2019    PT Start Time  1402    PT Stop Time  1445    PT Time Calculation (min)  43 min    Activity Tolerance  Patient tolerated treatment well    Behavior During Therapy  Restless;Anxious   fidgety throughout session, asks multiple questions; needs frequent redirection      Past Medical History:  Diagnosis Date  . Frequent falls   . Hypothyroidism   . Osteoporosis   . Parkinson's disease [facility])     Past Surgical History:  Procedure Laterality Date  . TONSILLECTOMY    . WRIST SURGERY      There were no vitals filed for this visit.  Subjective Assessment - 08/08/19 1410    Subjective  Denies any falls but does report near falls, especially when going up steps.  Feels like her toe catches.  Pt frequently switching subjects during session.    Patient Stated Goals  Pt's goal for therapy is to walk without fear of falling.    Currently in Pain?  No/denies    Pain Onset  More than a month ago        [facility] Adult PT Treatment/Exercise - 08/08/19 1556      Transfers   Transfers  Sit to Stand;Stand to Sit    Sit to Stand  5: Supervision;Without upper extremity assist;From bed    Sit to Stand Details  Verbal cues for technique;Verbal cues for sequencing    Stand to Sit  5: Supervision;Without upper extremity assist;To bed    Stand to Sit Details (indicate cue type and  reason)  Verbal cues for technique;Verbal cues for sequencing    Number of Reps  Other reps (comment)   Repeated 8 reps from various heights and compliant surfaces    Transfer Cueing  Pt continues to report difficulty getting up from couch at home.  Practiced varied heights of surfaces for sit<>stand and simulated low couch with bosu on 14" block.  Pt able to perform without any difficulty in clinic today.     Comments  needs cues to control descent at times      Ambulation/Gait   Ambulation/Gait  Yes    Ambulation/Gait Assistance  5: Supervision    Ambulation Distance (Feet)  --   into/out of clinic and between activities   Assistive device  None    Gait Pattern  Step-through pattern;Decreased arm swing - right;Decreased arm swing - left;Decreased dorsiflexion - right;Decreased dorsiflexion - left    Ambulation Surface  Level;Indoor    Stairs  Yes    Stairs Assistance  6: Modified independent (Device/Increase time)    Stairs Assistance Details (indicate cue type and reason)  cues to slow down.  Pt was literally "skipping" up steps with first several attempts and needed max cues and multiple reps to slow down to a safe speed.    Stair  Management Technique  One rail Right;Alternating pattern;Forwards    Number of Stairs  4   x 6 reps up/down   Height of Stairs  6      Posture/Postural Control   Posture/Postural Control  Postural limitations    Postural Limitations  Rounded Shoulders;Forward head   Trunk lean to right, head tilted R and rotated L   Posture Comments  Trunk/lower extremity dyskinesias Pt reports toes curling upon waking up in the mornings-fearful of going down stairs alone      Neuro Re-ed    Neuro Re-ed Details   in // bars for forward step weight shifts, backward step weight shifts, forward<>backward step weight shifts., side step weight shifts, PWR! Up in standing x 15 and PWR! reach x 15.  Pt needing cues with all activities to slow down and for technique.  Had pt  perform static holding in some positions to slow down movements.  Standing on BOSU on both surfaces in // bars for balance with UE support.  Performed 10 reps of squats on Black surface of BOSU with UE support.             PT Education - 08/08/19 1605    Education Details  slowing down with all activities and controlled movements    Person(s) Educated  Patient    Methods  Explanation    Comprehension  Verbalized understanding;Need further instruction       PT Short Term Goals - 07/23/19 3536      PT SHORT TERM GOAL #1   Title  Pt will be independent with HEP to address balance, transfers, gait.  TARGET 07/05/2019 (EXTENDED due to pt missing appointments for L wrist surgery):  new target 07/26/2019    Time  2    Period  Weeks    Status  Achieved      PT SHORT TERM GOAL #2   Title  Pt will perform sit<>stand at least 8 of 10 reps from low surfaces, simulating sofa, with no posterior lean, modified independently.    Baseline  1 episode of posterior lean, cues throughout practice    Time  2    Period  Weeks    Status  Partially Met      PT SHORT TERM GOAL #3   Title  Pt will verbalize understanding of fall prevention in home environment.    Baseline  Unable to fully address, due to pt's ongoing questions being addressed during therapy sessions    Time  2    Period  Weeks    Status  On-going        PT Long Term Goals - 07/17/19 0948      PT LONG TERM GOAL #1   Title  Pt will be independent with HEP for Parkinson's specific exercises, to address posture, transfers, balance, gait.  (Extended due to pt missing appts for L wrist surgery, New target:  08/23/2019)    Time  6    Period  Weeks    Status  On-going      PT LONG TERM GOAL #2   Title  Pt will perform TUG and TUG cognitive within 10% time difference, for improved ability for dual tasking with gait.    Time  6    Period  Weeks    Status  On-going      PT LONG TERM GOAL #3   Title  Pt will improve MiniBESTest score  to at least 25/28 for improved balance, decreased fall risk.  Time  6    Period  Weeks    Status  Revised      PT LONG TERM GOAL #4   Title  Pt will verbalize plans for ongoing community fitness upon d/c from PT.    Time  6    Period  Weeks    Status  On-going            Plan - 08/08/19 1607    Clinical Impression Statement  Pt continues to need education every visit regarding importance of slowing down her movements and working on controlled movements.  Skilled session focused on balance, postural stability, endurance and strength.  Pt continues to be very internally distracted during sessions and switches topics very frequently during the session.  Topics varied today form medication to whether she should sell her house to buying a pool table.  Continue PT per POC.    Personal Factors and Comorbidities  Behavior Pattern;Comorbidity 3+    Comorbidities  osteoporosis, R wrist fracture (11/2018), L wrist fracture (04/22/2019), hx of rib fracture    Examination-Activity Limitations  Stand;Stairs;Transfers;Locomotion Level    Examination-Participation Restrictions  Community Activity   Exercise   Stability/Clinical Decision Making  Evolving/Moderate complexity    Rehab Potential  Good    PT Frequency  2x / week    PT Duration  6 weeks   per recert 3/00/7622   PT Treatment/Interventions  ADLs/Self Care Home Management;Gait training;Stair training;Functional mobility training;Therapeutic activities;Therapeutic exercise;Balance training;Patient/family education;Neuromuscular re-education;DME Instruction    PT Next Visit Plan  Invite husband into session for improved carryover with PT recommendations; hip/ankle/step strategies, postural stability and attention to gait; gait training, turns    Consulted and Agree with Plan of Care  Patient       Patient will benefit from skilled therapeutic intervention in order to improve the following deficits and impairments:  Abnormal gait, Decreased  balance, Decreased mobility, Decreased knowledge of precautions, Decreased coordination, Difficulty walking, Decreased safety awareness, Decreased strength, Postural dysfunction  Visit Diagnosis: Other abnormalities of gait and mobility  Unsteadiness on feet  Abnormal posture     Problem List Patient Active Problem List   Diagnosis Date Noted  . Vitamin D deficiency 07/10/2019  . Vitamin B12 deficiency 07/10/2019  . Hypothyroidism   . Parkinson's disease ([place])   . Frequent falls   . Osteoporosis     Narda Bonds, [place] 08/08/19 4:11 PM Phone: [phone] Fax: [place] [address] [place], [place], [address] Phone: [phone]   Fax:  [phone]  Name: Kelly Howard MRN: [id number] Date of Birth: [birth year]  10th Visit Progress NOTE:  Covering Dates 04/29/2019-08/08/2019.  Pt has missed some recent visits due to scheduling conflicts, and previous visits due to having surgery on L wrist.  Most recent STGs:  Assessed 07/23/2019, with pt meeting HEP goal, partially meeting transfer goal.  She continues to demonstrate posterior lean with sit to stand transfers at times; she continues to demonstrate decreased safety awareness with gait, with increased speed of gait and stair negotiation.  She will continue to benefit from continued, consistent skilled PT sessions to further address funcitonal strength, balance, and gait for improved functional mobility and decreased falls.  Plan to continue to work towards [place].  Mady Haagensen, PT 08/15/19 3:07 PM Phone: [phone] Fax: [phone]

## 2019-08-08 NOTE — Telephone Encounter (Signed)
Sent message via mychart with new rx instructions.

## 2019-08-12 DIAGNOSIS — H1131 Conjunctival hemorrhage, right eye: Secondary | ICD-10-CM | POA: Diagnosis not present

## 2019-08-12 DIAGNOSIS — Z124 Encounter for screening for malignant neoplasm of cervix: Secondary | ICD-10-CM | POA: Diagnosis not present

## 2019-08-12 DIAGNOSIS — E039 Hypothyroidism, unspecified: Secondary | ICD-10-CM | POA: Diagnosis not present

## 2019-08-12 DIAGNOSIS — Z6820 Body mass index (BMI) 20.0-20.9, adult: Secondary | ICD-10-CM | POA: Diagnosis not present

## 2019-08-12 DIAGNOSIS — M810 Age-related osteoporosis without current pathological fracture: Secondary | ICD-10-CM | POA: Diagnosis not present

## 2019-08-13 ENCOUNTER — Ambulatory Visit: Payer: Medicare PPO | Admitting: Physical Therapy

## 2019-08-14 ENCOUNTER — Encounter: Payer: Medicare PPO | Admitting: Psychology

## 2019-08-15 ENCOUNTER — Other Ambulatory Visit: Payer: Self-pay

## 2019-08-15 ENCOUNTER — Ambulatory Visit: Payer: Medicare PPO | Admitting: Physical Therapy

## 2019-08-15 VITALS — BP 132/80 | HR 84

## 2019-08-15 DIAGNOSIS — R278 Other lack of coordination: Secondary | ICD-10-CM | POA: Diagnosis not present

## 2019-08-15 DIAGNOSIS — M6281 Muscle weakness (generalized): Secondary | ICD-10-CM | POA: Diagnosis not present

## 2019-08-15 DIAGNOSIS — R29818 Other symptoms and signs involving the nervous system: Secondary | ICD-10-CM | POA: Diagnosis not present

## 2019-08-15 DIAGNOSIS — M25632 Stiffness of left wrist, not elsewhere classified: Secondary | ICD-10-CM | POA: Diagnosis not present

## 2019-08-15 DIAGNOSIS — R41840 Attention and concentration deficit: Secondary | ICD-10-CM | POA: Diagnosis not present

## 2019-08-15 DIAGNOSIS — R2681 Unsteadiness on feet: Secondary | ICD-10-CM | POA: Diagnosis not present

## 2019-08-15 DIAGNOSIS — R2689 Other abnormalities of gait and mobility: Secondary | ICD-10-CM

## 2019-08-15 DIAGNOSIS — R293 Abnormal posture: Secondary | ICD-10-CM | POA: Diagnosis not present

## 2019-08-15 DIAGNOSIS — M25631 Stiffness of right wrist, not elsewhere classified: Secondary | ICD-10-CM | POA: Diagnosis not present

## 2019-08-15 DIAGNOSIS — S52572D Other intraarticular fracture of lower end of left radius, subsequent encounter for closed fracture with routine healing: Secondary | ICD-10-CM | POA: Diagnosis not present

## 2019-08-15 NOTE — Therapy (Signed)
[facility] [address] [place], [place], [address] Phone: [phone]   Fax:  [phone]  Physical Therapy Treatment  Patient Details  Name: Kelly Howard MRN: [id number] Date of Birth: [birth year] Referring Provider (PT): Siddiqui (Pt is seeing Dr. Carles Collet for first visit 05/01/2019)   Encounter Date: 08/15/2019  PT End of Session - 08/15/19 1610    Visit Number  11    Number of Visits  17    Date for PT Re-Evaluation  49/44/96   per recert 7/59/1638   Authorization Type  [place] request completed 06/24/2019    PT Start Time  4665    PT Stop Time  1602    PT Time Calculation (min)  46 min    Activity Tolerance  Patient tolerated treatment well    Behavior During Therapy  Restless;Anxious;Flat affect   fidgety throughout session, asks multiple questions; needs frequent redirection      Past Medical History:  Diagnosis Date  . Frequent falls   . Hypothyroidism   . Osteoporosis   . Parkinson's disease [facility])     Past Surgical History:  Procedure Laterality Date  . TONSILLECTOMY    . WRIST SURGERY      Vitals:   08/15/19 1532  BP: 132/80  Pulse: 84    Subjective Assessment - 08/15/19 1520    Subjective  I think I'm having a panic attack, so I took a part of a Clonazepam.  I just feel like I need to move slowly.  No falls.    Patient Stated Goals  Pt's goal for therapy is to walk without fear of falling.    Currently in Pain?  No/denies    Pain Onset  More than a month ago                       [facility] PT Treatment/Exercise - 08/15/19 0001      Ambulation/Gait   Ambulation/Gait  Yes    Ambulation/Gait Assistance  5: Supervision    Ambulation/Gait Assistance Details  Pt starts gait with more relaxed pattern of UE's; occasional cues to relax through UEs for relaxed arm swing    Ambulation Distance (Feet)  1000 Feet    Assistive device  None    Gait Pattern   Step-through pattern;Decreased arm swing - right;Decreased arm swing - left;Decreased dorsiflexion - right;Decreased dorsiflexion - left    Ambulation Surface  Level;Indoor    Gait Comments  Pt with overall decreased dyskinesias, improved control with gait today.  Gait with conversation tasks, no LOB, no foot catching episodes.      Neuro Re-ed    Neuro Re-ed Details   Standing at counter on Airex cushion:  side step and weightshift x 10 reps, then side step up and over, x 10 reps (cues for slowed, controlle motions and increased foot clearance).  Forward<>back step and weightshift x 10 reps with light UE support at counter.  Minisquats on Airex x 10 reps no UE support, min guard.      Exercises   Exercises  Knee/Hip;Other Exercises    Other Exercises   Deep breathing exercises upon entering PT, as pt reports feeling overly anxious.  In quiet room, pt able to concentrate on slow, deep breathing x several minutes.  That in combination with use of SciFit allows pt to report feeling better, like she can continue with therapy session.  Explained in relation to pt's bike at home how  to monitor activity level and start with 5-10 minutes several times a day to work on increaseing aerobic activity.      Knee/Hip Exercises: Aerobic   Stepper  Seated SciFit Stepper, Level 1.5-1.9, lower extremities only x 8 minutes     Other Aerobic  Discussed how to translate aerobic activity into home-using her bike at home 5-10 minutes/day at low resistance, speed where she is going in a controlled fast manner.             PT Education - 08/15/19 1609    Education Details  Breathing techniques, use of bike at home for aerobic activity    Person(s) Educated  Patient    Methods  Explanation    Comprehension  Verbalized understanding;Returned demonstration       PT Short Term Goals - 07/23/19 6256      PT SHORT TERM GOAL #1   Title  Pt will be independent with HEP to address balance, transfers, gait.  TARGET  07/05/2019 (EXTENDED due to pt missing appointments for L wrist surgery):  new target 07/26/2019    Time  2    Period  Weeks    Status  Achieved      PT SHORT TERM GOAL #2   Title  Pt will perform sit<>stand at least 8 of 10 reps from low surfaces, simulating sofa, with no posterior lean, modified independently.    Baseline  1 episode of posterior lean, cues throughout practice    Time  2    Period  Weeks    Status  Partially Met      PT SHORT TERM GOAL #3   Title  Pt will verbalize understanding of fall prevention in home environment.    Baseline  Unable to fully address, due to pt's ongoing questions being addressed during therapy sessions    Time  2    Period  Weeks    Status  On-going        PT Long Term Goals - 07/17/19 0948      PT LONG TERM GOAL #1   Title  Pt will be independent with HEP for Parkinson's specific exercises, to address posture, transfers, balance, gait.  (Extended due to pt missing appts for L wrist surgery, New target:  08/23/2019)    Time  6    Period  Weeks    Status  On-going      PT LONG TERM GOAL #2   Title  Pt will perform TUG and TUG cognitive within 10% time difference, for improved ability for dual tasking with gait.    Time  6    Period  Weeks    Status  On-going      PT LONG TERM GOAL #3   Title  Pt will improve MiniBESTest score to at least 25/28 for improved balance, decreased fall risk.    Time  6    Period  Weeks    Status  Revised      PT LONG TERM GOAL #4   Title  Pt will verbalize plans for ongoing community fitness upon d/c from PT.    Time  6    Period  Weeks    Status  On-going            Plan - 08/15/19 1610    Clinical Impression Statement  Pt with more flat affect today, reporting being very anxious upon entering PT session.  Vital signs within normal limits and with deep breathing exercises and aerobic  activity on SciFit Stepper, pt reports feeling more like herself and wants to continue with session.  Overall, pt  with more slowed, controlled movement patterns and decreased dyskinesias today.  Asks appropriate questions about use of exercise bike at home and how to organize her exercises.  Discussed briefly her POC and that since her visits have been somewhat sporadic, that she would benefit from consistently scheduled attendance to appointments for most optimal carryover.  Also, PT (and pt) unsure if her precautions for use and weightbearing on LLE have been lifted; so we have not yet worked again with walker scenarios.    Personal Factors and Comorbidities  Behavior Pattern;Comorbidity 3+    Comorbidities  osteoporosis, R wrist fracture (11/2018), L wrist fracture (04/22/2019), hx of rib fracture    Examination-Activity Limitations  Stand;Stairs;Transfers;Locomotion Level    Examination-Participation Restrictions  Community Activity   exercise   Stability/Clinical Decision Making  Evolving/Moderate complexity    Rehab Potential  Good    PT Frequency  2x / week    PT Duration  6 weeks   per recert 09/05/1446   PT Treatment/Interventions  ADLs/Self Care Home Management;Gait training;Stair training;Functional mobility training;Therapeutic activities;Therapeutic exercise;Balance training;Patient/family education;Neuromuscular re-education;DME Instruction    PT Next Visit Plan  Check goals next week and likely recert (asked pt/pt agreed to add more appts to try to consistently get 2x/wk for 4 weeks for optimal carryover); if pt brings in her folder with exercises, need to fully review HEP and consolidate/put in exercise chart if needed    Consulted and Agree with Plan of Care  Patient       Patient will benefit from skilled therapeutic intervention in order to improve the following deficits and impairments:  Abnormal gait, Decreased balance, Decreased mobility, Decreased knowledge of precautions, Decreased coordination, Difficulty walking, Decreased safety awareness, Decreased strength, Postural  dysfunction  Visit Diagnosis: Unsteadiness on feet  Other abnormalities of gait and mobility     Problem List Patient Active Problem List   Diagnosis Date Noted  . Vitamin D deficiency 07/10/2019  . Vitamin B12 deficiency 07/10/2019  . Hypothyroidism   . Parkinson's disease ([place])   . Frequent falls   . Osteoporosis     Kaiden Dardis W. 08/15/2019, 4:17 PM  Frazier Butt., PT   [facility] [address] [place], [place], [address] Phone: [phone]   Fax:  [phone]  Name: Kelly Howard MRN: [id number] Date of Birth: September 23, 1950

## 2019-08-20 ENCOUNTER — Other Ambulatory Visit: Payer: Self-pay | Admitting: Neurology

## 2019-08-20 ENCOUNTER — Other Ambulatory Visit: Payer: Self-pay | Admitting: Internal Medicine

## 2019-08-20 ENCOUNTER — Ambulatory Visit: Payer: Medicare PPO | Admitting: Physical Therapy

## 2019-08-20 ENCOUNTER — Ambulatory Visit: Payer: Medicare PPO | Attending: Internal Medicine

## 2019-08-20 ENCOUNTER — Other Ambulatory Visit: Payer: Self-pay

## 2019-08-20 DIAGNOSIS — R278 Other lack of coordination: Secondary | ICD-10-CM | POA: Diagnosis not present

## 2019-08-20 DIAGNOSIS — M25632 Stiffness of left wrist, not elsewhere classified: Secondary | ICD-10-CM | POA: Diagnosis not present

## 2019-08-20 DIAGNOSIS — Z23 Encounter for immunization: Secondary | ICD-10-CM

## 2019-08-20 DIAGNOSIS — R293 Abnormal posture: Secondary | ICD-10-CM | POA: Diagnosis not present

## 2019-08-20 DIAGNOSIS — R2681 Unsteadiness on feet: Secondary | ICD-10-CM | POA: Diagnosis not present

## 2019-08-20 DIAGNOSIS — E559 Vitamin D deficiency, unspecified: Secondary | ICD-10-CM

## 2019-08-20 DIAGNOSIS — M6281 Muscle weakness (generalized): Secondary | ICD-10-CM | POA: Diagnosis not present

## 2019-08-20 DIAGNOSIS — R29818 Other symptoms and signs involving the nervous system: Secondary | ICD-10-CM | POA: Diagnosis not present

## 2019-08-20 DIAGNOSIS — R2689 Other abnormalities of gait and mobility: Secondary | ICD-10-CM | POA: Diagnosis not present

## 2019-08-20 DIAGNOSIS — R41840 Attention and concentration deficit: Secondary | ICD-10-CM | POA: Diagnosis not present

## 2019-08-20 DIAGNOSIS — M25631 Stiffness of right wrist, not elsewhere classified: Secondary | ICD-10-CM | POA: Diagnosis not present

## 2019-08-20 NOTE — Progress Notes (Signed)
   Covid-19 Vaccination Clinic  Name:  Kelly Howard    MRN: [id number] DOB: [birth year]  08/20/2019  [name] was observed post Covid-19 immunization for 15 minutes without incident. She was provided with Vaccine Information Sheet and instruction to access the V-Safe system.   [name] was instructed to call 911 with any severe reactions post vaccine: Marland Kitchen Difficulty breathing  . Swelling of face and throat  . A fast heartbeat  . A bad rash all over body  . Dizziness and weakness   Immunizations Administered    Name Date Dose VIS Date Route   Pfizer COVID-19 Vaccine 08/20/2019  1:11 PM 0.3 mL 05/10/2019 Intramuscular   Manufacturer: [employer], [employer]   Lot: [id number]   NDC: [address]-7

## 2019-08-20 NOTE — Therapy (Signed)
[facility] [address] [place], [place], [place], [address] Phone: [phone]   Fax:  [phone]  Patient Details  Name: Kelly Howard MRN: [id number] Date of Birth: Oct 27, 1950 Referring Provider:  Dr. Rubin Payor, however pt requests paperwork be sent to Dr. Kerin Salen, DO  Encounter Date: 08/20/2019  SLP did chart review to assess if pt's behavior in other therapies since ST put on hold late last month to assess whether or not to place pt off of "hold" status and back to "active" status. Pt with limited (3) therapy visits in the past 4 weeks, with anxious/internally distracting behaviors noted, and it being necessary for pt to spend time away from other activity in the gym and engage in focused relaxation tasks in order to allow active participation in PT.  SLP believes it is best to cont to have pt remin on "hold ST" status. Pt has 4 therapy sessions scheduled between today's date and 09-03-19. SLP will examine pt's chart approx 09-04-19 to re-assess pt appropriateness to cont skilled ST. If at that time pt remains inappropriate due to behavioral challenges/attention, SLP will d/c pt, and suggest a script be sent when pt's anxious behaviors and attention are at a level to participate and benefit from skilled ST.   [facility] ,MS, CCC-SLP  08/20/2019, 5:07 PM  [facility] [address] [place], [place], [address] Phone: [phone]   Fax:  [phone]

## 2019-08-21 NOTE — Therapy (Signed)
[facility] [address] [place], [place], [address] Phone: [phone]   Fax:  [phone]  Physical Therapy Treatment  Patient Details  Name: Kelly Howard MRN: [id number] Date of Birth: 1950-09-06 Referring Provider (PT): Siddiqui (Pt is seeing Dr. Carles Collet for first visit 05/01/2019)   Encounter Date: 08/20/2019  PT End of Session - 08/21/19 0953    Visit Number  12    Number of Visits  17    Date for PT Re-Evaluation  91/66/06   per recert 0/08/5995   Authorization Type  [place] request completed 06/24/2019    PT Start Time  1018    PT Stop Time  1100    PT Time Calculation (min)  42 min    Activity Tolerance  Patient tolerated treatment well    Behavior During Therapy  Restless;Flat affect   fidgety throughout session, asks multiple questions; needs frequent redirection      Past Medical History:  Diagnosis Date  . Frequent falls   . Hypothyroidism   . Osteoporosis   . Parkinson's disease [facility])     Past Surgical History:  Procedure Laterality Date  . TONSILLECTOMY    . WRIST SURGERY      There were no vitals filed for this visit.  Subjective Assessment - 08/21/19 0952    Subjective  I feel much better today than I did last visit; just never know when that type of anxiety will happen.  No falls since last visit.  I brought in all my folders, I think my exercises are in there.    Patient Stated Goals  Pt's goal for therapy is to walk without fear of falling.    Currently in Pain?  No/denies    Pain Onset  More than a month ago           Therapeutic Exercise:  Focused time during session with full review of HEP (see below): -Updated pictures of standing exercises to MedBridge pictures for improved consistency of pictures and instructions -Educated patient in keeping exercises in marked HEP folder for ease of reference, to perform HEP 2x/day -Pt asks questions about walking for  exercise:  Pt provides reinforcement of the following education previously given  For SAFETY with WALKING: -SLOW DOWN -HEEL TO TOE WALKING -LOOK AHEAD AT [place]  -Also educated patient that currently if she is walking in the community, she needs close supervision of husband to prevent falls.  Reviewed pt's chart after PT visit and see that she is cleared for weightbearing to LUE, so will provide additional training with UpWalker (?or U-Step) next visit).   Seated Active Hip Flexion - 1 x daily - 7 x weekly - 10 reps - 1 sets - 5 sec hold (cues for hold time in upright posture to sitting each rep) Sit to Stand - 1 x daily - 7 x weekly - 10 reps - 1 sets - 5 sec hold (cues for hand placement and for upright posture upon standing; cues to slow descent into sitting) Alternating Step Forward with Support - 1-2 x daily - 6 x weekly - 2 sets - 10 reps Alternating Step Backward with Support - 1-2 x daily - 6 x weekly - 2 sets - 10 reps Side Stepping with Counter Support - 1-2 x daily - 6 x weekly - 2 sets - 10 reps  For all above standing exercises-PT provides cues for widened BOS upon return to start position for improved static  standing balance; also cues for avoiding leading with toes with forward and with side stepping (as in a dancer pointed toe position).  Pt returns to standing on toes at times and educated that standing on toes and leading with toes is unsafe with exercises and with gait due to increasing pt's risk of falls.  Cues provided in standing for looking ahead during the exercises.                        PT Short Term Goals - 07/23/19 9030      PT SHORT TERM GOAL #1   Title  Pt will be independent with HEP to address balance, transfers, gait.  TARGET 07/05/2019 (EXTENDED due to pt missing appointments for L wrist surgery):  new target 07/26/2019    Time  2    Period  Weeks    Status  Achieved      PT SHORT TERM GOAL #2   Title  Pt will perform  sit<>stand at least 8 of 10 reps from low surfaces, simulating sofa, with no posterior lean, modified independently.    Baseline  1 episode of posterior lean, cues throughout practice    Time  2    Period  Weeks    Status  Partially Met      PT SHORT TERM GOAL #3   Title  Pt will verbalize understanding of fall prevention in home environment.    Baseline  Unable to fully address, due to pt's ongoing questions being addressed during therapy sessions    Time  2    Period  Weeks    Status  On-going        PT Long Term Goals - 07/17/19 0948      PT LONG TERM GOAL #1   Title  Pt will be independent with HEP for Parkinson's specific exercises, to address posture, transfers, balance, gait.  (Extended due to pt missing appts for L wrist surgery, New target:  08/23/2019)    Time  6    Period  Weeks    Status  On-going      PT LONG TERM GOAL #2   Title  Pt will perform TUG and TUG cognitive within 10% time difference, for improved ability for dual tasking with gait.    Time  6    Period  Weeks    Status  On-going      PT LONG TERM GOAL #3   Title  Pt will improve MiniBESTest score to at least 25/28 for improved balance, decreased fall risk.    Time  6    Period  Weeks    Status  Revised      PT LONG TERM GOAL #4   Title  Pt will verbalize plans for ongoing community fitness upon d/c from PT.    Time  6    Period  Weeks    Status  On-going            Plan - 08/21/19 0955    Clinical Impression Statement  Pt brings in multiple folders of medical information today, but does have her HEP pictures scattered in them.  Assisted patient with organizing HEP pictures (which were updated today for consistent pictures and written instructions) in a separate folder.  Reviewed exercises, with pt able to perform exercises with minimal cues (specifically, for hold time in sitting, for slowed transfers, and for widened BOS and avoid going up on toes in standing).  Pt  asks multiple questions  today on other topics-walking, selling cuurent house, is speech resuming appointments-while PT is attempting to consolidate and reinforce HEP.  PT will plan to assess goals and likely recert next visit. (see clinical impression from last visit).  Per scanned note from Dr. Burney Gauze in pt's chart, she is able to bear full weight on LUE, so trial/training with UPWalker can begin.    Personal Factors and Comorbidities  Behavior Pattern;Comorbidity 3+    Comorbidities  osteoporosis, R wrist fracture (11/2018), L wrist fracture (04/22/2019), hx of rib fracture    Examination-Activity Limitations  Stand;Stairs;Transfers;Locomotion Level    Examination-Participation Restrictions  Community Activity   exercise   Stability/Clinical Decision Making  Evolving/Moderate complexity    Rehab Potential  Good    PT Frequency  2x / week    PT Duration  6 weeks   per recert 12/07/6267   PT Treatment/Interventions  ADLs/Self Care Home Management;Gait training;Stair training;Functional mobility training;Therapeutic activities;Therapeutic exercise;Balance training;Patient/family education;Neuromuscular re-education;DME Instruction    PT Next Visit Plan  Did pt bring in her HEP folder, if so-has she been doing exercises 2x/day?  (consider using white board for tasks in session) NEED TO CHECK LTGS, provide fall prevention education, trial UPWALKER and address use for home.  Recert for additional POC.    Consulted and Agree with Plan of Care  Patient       Patient will benefit from skilled therapeutic intervention in order to improve the following deficits and impairments:  Abnormal gait, Decreased balance, Decreased mobility, Decreased knowledge of precautions, Decreased coordination, Difficulty walking, Decreased safety awareness, Decreased strength, Postural dysfunction  Visit Diagnosis: Abnormal posture  Unsteadiness on feet     Problem List Patient Active Problem List   Diagnosis Date Noted  . Vitamin D  deficiency 07/10/2019  . Vitamin B12 deficiency 07/10/2019  . Hypothyroidism   . Parkinson's disease ([place])   . Frequent falls   . Osteoporosis     Porshea Janowski W. 08/21/2019, 10:08 AM Frazier Butt., PT  [facility] [address] [place], [place], [address] Phone: [phone]   Fax:  [phone]  Name: Avni Traore MRN: [id number] Date of Birth: 1950-10-21

## 2019-08-21 NOTE — Patient Instructions (Signed)
Access Code: [email or web address] URL: [email or web address] Date: 08/20/2019 Prepared by: Lonia Blood  Program Notes For SAFETY with WALKING: -SLOW DOWN -HEEL TO TOE WALKING -LOOK AHEAD AT WHERE YOU ARE WALKING      Exercises Seated Active Hip Flexion - 1 x daily - 7 x weekly - 10 reps - 1 sets - 5 sec hold Sit to Stand - 1 x daily - 7 x weekly - 10 reps - 1 sets - 5 sec hold Alternating Step Forward with Support - 1-2 x daily - 6 x weekly - 2 sets - 10 reps Alternating Step Backward with Support - 1-2 x daily - 6 x weekly - 2 sets - 10 reps Side Stepping with Counter Support - 1-2 x daily - 6 x weekly - 2 sets - 10 reps

## 2019-08-22 ENCOUNTER — Other Ambulatory Visit: Payer: Self-pay

## 2019-08-22 ENCOUNTER — Ambulatory Visit: Payer: Medicare PPO | Admitting: Physical Therapy

## 2019-08-22 DIAGNOSIS — M25632 Stiffness of left wrist, not elsewhere classified: Secondary | ICD-10-CM | POA: Diagnosis not present

## 2019-08-22 DIAGNOSIS — R278 Other lack of coordination: Secondary | ICD-10-CM | POA: Diagnosis not present

## 2019-08-22 DIAGNOSIS — R2689 Other abnormalities of gait and mobility: Secondary | ICD-10-CM | POA: Diagnosis not present

## 2019-08-22 DIAGNOSIS — R2681 Unsteadiness on feet: Secondary | ICD-10-CM | POA: Diagnosis not present

## 2019-08-22 DIAGNOSIS — M25631 Stiffness of right wrist, not elsewhere classified: Secondary | ICD-10-CM | POA: Diagnosis not present

## 2019-08-22 DIAGNOSIS — M6281 Muscle weakness (generalized): Secondary | ICD-10-CM | POA: Diagnosis not present

## 2019-08-22 DIAGNOSIS — R293 Abnormal posture: Secondary | ICD-10-CM | POA: Diagnosis not present

## 2019-08-22 DIAGNOSIS — R29818 Other symptoms and signs involving the nervous system: Secondary | ICD-10-CM | POA: Diagnosis not present

## 2019-08-22 DIAGNOSIS — R41840 Attention and concentration deficit: Secondary | ICD-10-CM | POA: Diagnosis not present

## 2019-08-23 NOTE — Therapy (Signed)
[facility] [address] [place], [place], [address] Phone: [phone]   Fax:  [phone]  Physical Therapy Treatment  Patient Details  Name: Kelly Howard MRN: [id number] Date of Birth: April 24, 1951 Referring Provider (PT): Siddiqui (Pt is seeing Dr. Carles Collet for first visit 05/01/2019)   Encounter Date: 08/22/2019  PT End of Session - 08/23/19 1457    Visit Number  13    Number of Visits  21    Date for PT Re-Evaluation  48/54/62   60 day cert for 4 week addition to [place] request completed 06/24/2019    PT Start Time  1236   pt arrives late   PT Stop Time  1316    PT Time Calculation (min)  40 min    Activity Tolerance  Patient tolerated treatment well    Behavior During Therapy  Restless;Flat affect   fidgety throughout session, asks multiple questions; needs frequent redirection      Past Medical History:  Diagnosis Date  . Frequent falls   . Hypothyroidism   . Osteoporosis   . Parkinson's disease [facility])     Past Surgical History:  Procedure Laterality Date  . TONSILLECTOMY    . WRIST SURGERY      There were no vitals filed for this visit.  Subjective Assessment - 08/22/19 1243    Subjective  Feel very stressed; having tremors.  Have not had a time to do exercises yesterday.    Patient Stated Goals  Pt's goal for therapy is to walk without fear of falling.    Pain Onset  More than a month ago                       [facility] Adult PT Treatment/Exercise - 08/23/19 0001      Ambulation/Gait   Ambulation/Gait  Yes    Ambulation/Gait Assistance  5: Supervision    Ambulation Distance (Feet)  585 Feet   490 in 3 minutes   Gait Comments  3 minute walk test:  445 ft with U-Step rollator      Standardized Balance Assessment   Standardized Balance Assessment  Mini-BESTest      Timed Up and Go Test   TUG  Normal TUG;Cognitive TUG    Normal TUG  (seconds)  10.31    Cognitive TUG (seconds)  19.37      Mini-BESTest   Sit To Stand  Normal: Comes to stand without use of hands and stabilizes independently.    Rise to Toes  Normal: Stable for 3 s with maximum height.    Stand on one leg (left)  Moderate: < 20 s   5.19,20   Stand on one leg (right)  Normal: 20 s.    Stand on one leg - lowest score  1    Compensatory Stepping Correction - Forward  Normal: Recovers independently with a single, large step (second realignement is allowed).    Compensatory Stepping Correction - Backward  Normal: Recovers independently with a single, large step    Compensatory Stepping Correction - Left Lateral  Normal: Recovers independently with 1 step (crossover or lateral OK)    Compensatory Stepping Correction - Right Lateral  Normal: Recovers independently with 1 step (crossover or lateral OK)    Stepping Corredtion Lateral - lowest score  2    Stance - Feet together, eyes open, firm surface   Normal: 30s    Stance -  Feet together, eyes closed, foam surface   Normal: 30s    Incline - Eyes Closed  Normal: Stands independently 30s and aligns with gravity    Change in Gait Speed  Normal: Significantly changes walkling speed without imbalance    Walk with head turns - Horizontal  Moderate: performs head turns with reduction in gait speed.    Walk with pivot turns  Moderate:Turns with feet close SLOW (>4 steps) with good balance.    Step over obstacles  Moderate: Steps over box but touches box OR displays cautious behavior by slowing gait.    Timed UP & GO with Dual Task  Severe: Stops counting while walking OR stops walking while counting.   10.31; 19.37   Mini-BEST total score  22      Self-Care   Self-Care  Other Self-Care Comments    Other Self-Care Comments   Reviewed HEP (verbally), with pt bringing in HEP folder.  She reports not having time to do HEP since last visit.  Discussed/reviewed fall prevention in home environrment.  Discussed/revisited use  of U-step RW (mistakenly at some point had discussed UpWalker?-unsure if this was because of her WB restrictions due to her wrist fracture).  Discussed benefits/safety with and potential indications for U-Step RW.             PT Education - 08/23/19 1455    Education Details  POC (recert today for 4 additional weeks for training with U-step RW); reviewed fall prevention    Person(s) Educated  Patient    Methods  Explanation    Comprehension  Verbalized understanding       PT Short Term Goals - 07/23/19 4196      PT SHORT TERM GOAL #1   Title  Pt will be independent with HEP to address balance, transfers, gait.  TARGET 07/05/2019 (EXTENDED due to pt missing appointments for L wrist surgery):  new target 07/26/2019    Time  2    Period  Weeks    Status  Achieved      PT SHORT TERM GOAL #2   Title  Pt will perform sit<>stand at least 8 of 10 reps from low surfaces, simulating sofa, with no posterior lean, modified independently.    Baseline  1 episode of posterior lean, cues throughout practice    Time  2    Period  Weeks    Status  Partially Met      PT SHORT TERM GOAL #3   Title  Pt will verbalize understanding of fall prevention in home environment.    Baseline  Unable to fully address, due to pt's ongoing questions being addressed during therapy sessions    Time  2    Period  Weeks    Status  On-going /Met with review discussion 08/22/2019       PT Long Term Goals - 08/23/19 1448      PT LONG TERM GOAL #1   Title  Pt will be independent with HEP for Parkinson's specific exercises, to address posture, transfers, balance, gait.  (Extended due to pt missing appts for L wrist surgery, New target:  08/23/2019)    Baseline  HEP has been initiated and reviewed; unsure that pt is performing consistently at home    Time  6    Period  Weeks    Status  Not Met      PT LONG TERM GOAL #2   Title  Pt will perform TUG and TUG cognitive within 10%  time difference, for improved  ability for dual tasking with gait.    Time  6    Period  Weeks    Status  Not Met      PT LONG TERM GOAL #3   Title  Pt will improve MiniBESTest score to at least 25/28 for improved balance, decreased fall risk.    Time  6    Period  Weeks    Status  Not Met      PT LONG TERM GOAL #4   Title  Pt will verbalize plans for ongoing community fitness upon d/c from PT.    Time  6    Period  Weeks    Status  On-going            Plan - 08/23/19 1459    Clinical Impression Statement  Assessed LTGs this visit, wtih LTG 1, 2, 3 not met and LTG 4 not met, but ongoing.  Pt has had inconsistent visits over her current POC, somewhat due to her wrist surgery and to several missed appointments.  Her MiniBESTest score today has decreased from 23/28 to 22/28.  Today is the first day we have trialed U-Step RW, as we do have clearance (scanned on chart) from Dr. Burney Gauze that she does not have weightbearing restrictions on LUE.  Pt will benefit from additional short course of therapy for gait training using U-step RW (Dr. Carles Collet has given the patient this order), including use in home and outdoors for longer distance gait.  This training would be beneficial given pt's history of falls and decreased safety awareness with gait.  Please see updated cert and goals for additional 4 weeks of POC.    Personal Factors and Comorbidities  Behavior Pattern;Comorbidity 3+    Comorbidities  osteoporosis, R wrist fracture (11/2018), L wrist fracture (04/22/2019), hx of rib fracture    Examination-Activity Limitations  Stand;Stairs;Transfers;Locomotion Level    Examination-Participation Restrictions  Community Activity   exercise   Stability/Clinical Decision Making  Evolving/Moderate complexity    Rehab Potential  Good    PT Frequency  2x / week    PT Duration  4 weeks   per recert 11/13/735   PT Treatment/Interventions  ADLs/Self Care Home Management;Gait training;Stair training;Functional mobility  training;Therapeutic activities;Therapeutic exercise;Balance training;Patient/family education;Neuromuscular re-education;DME Instruction    PT Next Visit Plan  Need to train with use of U-step walker (I incorrectly discussed Upwalker in last tx plan, but it should be U-STEP walker), including outdoors and inside with obstacle negotiation.  Need to discuss ordering (she will likely need to call number on [email or web address] website for ordering, as no vendors in this area participate with U-step walker to my knowlededge).  Work on standing balance exercises for heelstrike, foot clearance, weightshifting with additions made to HEP as appropriate.    Consulted and Agree with Plan of Care  Patient       Patient will benefit from skilled therapeutic intervention in order to improve the following deficits and impairments:  Abnormal gait, Decreased balance, Decreased mobility, Decreased knowledge of precautions, Decreased coordination, Difficulty walking, Decreased safety awareness, Decreased strength, Postural dysfunction  Visit Diagnosis: Unsteadiness on feet  Other abnormalities of gait and mobility  Abnormal posture     Problem List Patient Active Problem List   Diagnosis Date Noted  . Vitamin D deficiency 07/10/2019  . Vitamin B12 deficiency 07/10/2019  . Hypothyroidism   . Parkinson's disease ([place])   . Frequent falls   . Osteoporosis  Gerldine Suleiman W. 08/23/2019, 3:08 PM  Frazier Butt., PT   [facility] [address] [place], [place], [address] Phone: [phone]   Fax:  [phone]  Name: Lakeishia Truluck MRN: [id number] Date of Birth: 04-Aug-1950  New goals for recert:  PT Long Term Goals - 08/23/19 1509      PT LONG TERM GOAL #1   Title  Pt will be independent with HEP for Parkinson's specific exercises, to address posture, transfers, balance, gait.  TARGET  10/04/2019(pt will return wk of 09/11/2019 due to being  out of town)    Baseline  HEP has been initiated and reviewed; unsure that pt is performing consistently at home; could perform and add PWR! Moves    Time  4    Period  Weeks    Status  Revised      PT LONG TERM GOAL #2   Title  Pt will verbalize understanding of means of obtaining U-step RW for use at home and in community.    Time  4    Period  Weeks    Status  Revised      PT LONG TERM GOAL #3   Title  Pt will perform gait using U-step RW around obstacles, through doorways, and tight turning spaces in gym area, with supervision, simulating home situations.    Time  4    Period  Weeks    Status  Revised      PT LONG TERM GOAL #4   Title  Pt will verbalize plans for ongoing community fitness upon d/c from PT.    Time  4    Period  Weeks    Status  On-going      PT LONG TERM GOAL #5   Title  Pt will ambulate at least 1000 ft, indoors and outdoor surfaces, using U-step RW,with supervision for improved safety with community gait.    Time  4    Period  Weeks    Status  Karoline Caldwell, [place] 08/23/19 3:15 PM Phone: [phone] Fax: [phone]

## 2019-08-27 DIAGNOSIS — F4323 Adjustment disorder with mixed anxiety and depressed mood: Secondary | ICD-10-CM | POA: Diagnosis not present

## 2019-09-02 ENCOUNTER — Telehealth: Payer: Self-pay | Admitting: Internal Medicine

## 2019-09-02 ENCOUNTER — Telehealth: Payer: Self-pay | Admitting: Neurology

## 2019-09-02 NOTE — Telephone Encounter (Signed)
Patient concerned about panic attacks, stressed. Contacted patient, has started using Melatoin, she is going to reach out to her PCP but wanted you to know.

## 2019-09-02 NOTE — Telephone Encounter (Signed)
Pt would like a call to get clarification on her medications.

## 2019-09-02 NOTE — Telephone Encounter (Signed)
Patient wants to speak to someone she left message on the VM stating she is having a Panic attack and has blood pressure problems please call

## 2019-09-03 ENCOUNTER — Telehealth (INDEPENDENT_AMBULATORY_CARE_PROVIDER_SITE_OTHER): Payer: Medicare PPO | Admitting: Family Medicine

## 2019-09-03 ENCOUNTER — Encounter: Payer: Self-pay | Admitting: Family Medicine

## 2019-09-03 ENCOUNTER — Ambulatory Visit: Payer: Medicare PPO | Admitting: Physical Therapy

## 2019-09-03 ENCOUNTER — Encounter: Payer: Medicare PPO | Admitting: Occupational Therapy

## 2019-09-03 DIAGNOSIS — F419 Anxiety disorder, unspecified: Secondary | ICD-10-CM

## 2019-09-03 DIAGNOSIS — G20 Parkinson's disease: Secondary | ICD-10-CM

## 2019-09-03 DIAGNOSIS — F410 Panic disorder [episodic paroxysmal anxiety] without agoraphobia: Secondary | ICD-10-CM | POA: Diagnosis not present

## 2019-09-03 DIAGNOSIS — M810 Age-related osteoporosis without current pathological fracture: Secondary | ICD-10-CM

## 2019-09-03 DIAGNOSIS — I959 Hypotension, unspecified: Secondary | ICD-10-CM | POA: Diagnosis not present

## 2019-09-03 NOTE — Progress Notes (Addendum)
Virtual Visit via Video Note  I connected with Kelly Howard  on 09/03/19 at  3:00 PM EDT by a video enabled telemedicine application and verified that I am speaking with the correct person using two identifiers.  Location patient: home, [place] Location provider:work or home office Persons participating in the virtual visit: patient, provider, husband  I discussed the limitations of evaluation and management by telemedicine and the availability of in person appointments. The patient expressed understanding and agreed to proceed.   HPI:  Acute visit for feeling anxious and nervous: -reports this has been going on intermittently for some time, worse the last few weeks -intermittently feels nervous and anxious, sometimes will have sensations in the face, SOB, nausea with panic attacks - reports diagnosed with panic attacks in the past, now she gets nervous that she may get more anxious or have a panic attack -sometimes her BP is low when she feels this way - was 94/56, P 81 today - but sometimes is lower than that; no dizziness with stanting or syncope. Husband reports a long history of lower BP. -on multiple medications - zoloft, clonazepam, sinemet, risagiline (to name a few) -she is very anxious about the side effects with all of her medications. She takes tylenol 3 times per day (325mg ) and she worries about the side effects. She also worries about the Boniva that her gyn doctor gave her. She tolerated it, but she worries about the side effects to her esophagus after reading about this. Has not had any side effects,  but this has made her very anxious.  -she also has had several falls in the past and this makes her anxious -she is only taking 1/2 tablet of the clonazepam at night sometimes now as is worried about side effects after reading about it. Reports she was on this medication twice daily until recently. -listed PMH Parkinson's, Hypothyroidism -saw PCP for preventive visit a few months ago,  followed by neurology for Parkinson's -per brief review chart neurology has advised she see a psychiatrist about the anxient -no depression, thoughts of harm hallucinations, SI  ROS: See pertinent positives and negatives per HPI.  Past Medical History:  Diagnosis Date  . Frequent falls   . Hypothyroidism   . Osteoporosis   . Parkinson's disease [facility])     Past Surgical History:  Procedure Laterality Date  . TONSILLECTOMY    . WRIST SURGERY      Family History  Problem Relation Age of Onset  . Hypercalcemia Mother   . Cancer Paternal Grandmother   . Diabetes Paternal Grandfather     SOCIAL HX: see hpi   Current Outpatient Medications:  .  Acetaminophen (TYLENOL PO), Take 500 mg by mouth as needed., Disp: , Rfl:  .  AMBULATORY NON FORMULARY MEDICATION, U step walker  Dx: G20, Disp: 1 Device, Rfl: 0 .  carbidopa-levodopa (SINEMET CR) 50-200 MG tablet, Take 1 tablet by mouth at bedtime., Disp: 30 tablet, Rfl: 1 .  carbidopa-levodopa (SINEMET IR) 25-100 MG tablet, Take 1.5 tablets by mouth 4 (four) times daily. 1.5 tablet every 3 hrs (Patient taking differently: Take 1.5 tablets by mouth. 1.5 tablet every 3 hrs), Disp: 180 tablet, Rfl: 0 .  clonazePAM (KLONOPIN) 0.5 MG tablet, TAKE 1 TABLET BY MOUTH AT BEDTIME., Disp: 30 tablet, Rfl: 3 .  levothyroxine (SYNTHROID, LEVOTHROID) 50 MCG tablet, Take by mouth., Disp: , Rfl:  .  MELATONIN PO, Take 3 mg by mouth daily., Disp: , Rfl:  .  rasagiline (AZILECT) 1  MG TABS tablet, TAKE 1 TABLET BY MOUTH EVERY DAY, Disp: 90 tablet, Rfl: 1 .  sertraline (ZOLOFT) 50 MG tablet, TAKE 1 TABLET BY MOUTH EVERY DAY, Disp: 90 tablet, Rfl: 1 .  Vitamin D, Ergocalciferol, (DRISDOL) 1.25 MG (50000 UNIT) CAPS capsule, TAKE 1 CAPSULE (50,000 UNITS TOTAL) BY MOUTH EVERY 7 (SEVEN) DAYS FOR 12 DOSES., Disp: 12 capsule, Rfl: 0  EXAM:  VITALS per patient if applicable:  GENERAL: alert, oriented, appears well and in no acute distress  HEENT: atraumatic,  conjunttiva clear, no obvious abnormalities on inspection of external nose and ears  NECK: normal movements of the head and neck  LUNGS: on inspection no signs of respiratory distress, breathing rate appears normal, no obvious gross SOB, gasping or wheezing  CV: no obvious cyanosis  MS: moves all visible extremities without noticeable abnormality  PSYCH/NEURO: pleasant and cooperative, no obvious depression, anxious and tearful at times  ASSESSMENT AND PLAN:  Spent over 40  Minutes caring for this patient. Discussed the following assessment and plan:  Anxiety  Panic attacks  Age-related osteoporosis without current pathological fracture  Parkinson's disease (HCC)  Hypotension, unspecified hypotension type  -we discussed possible serious and likely etiologies, options for evaluation and workup, limitations of telemedicine visit vs in person visit, treatment, treatment risks and precautions. Pt prefers to treat via telemedicine empirically rather then risking or undertaking an in person visit at this moment. Had a very lengthy conversation with her and her husband. Discussed anxiety and medications and other options for management. She has a therapist and does group therapy - advised plugging in more regularly and asking for specific help with anxiety and panic attacks. Advised seeing a psychiatrist for help with medication management given her concerns and per neurology recs. She and her husband agree to call [facility] to set this up. In the interim since taking much less of her clonazepam than is prescribed (takes 1/2 tab at night only some nights) suggested could possibly take 1/2 tablet when needed once daily for panic if ok with her neurologist whom prescribed it. They agreed to check.  Discussed the lower BP and management. Fall precautions, sodium in diet, physical therapy and perhaps seeing cardiology if were to become a big issue. She denies being symptomatic with it at this  point. Discussed options for osteoporosis. She sees her gyn doctor for this. She was anxious about side effects to [facility] after reading about it. Advised is important to take with plenty of water and sit up for 30 minutes after taking. She is going to try this and talk with her PCP or gyn if any issues - husband reports she had no issues with it when she took it.  Patient agrees to seek prompt in person care if worsening, new symptoms arise, or if is not improving with treatment. Follow up with PCP in 2-4 weeks.    I discussed the assessment and treatment plan with the patient. The patient was provided an opportunity to ask questions and all were answered. The patient agreed with the plan and demonstrated an understanding of the instructions.  She reported feeling much better after our conversation.   The patient was advised to call back or seek an in-person evaluation if the symptoms worsen or if the condition fails to improve as anticipated.   Lucretia Kern, DO

## 2019-09-03 NOTE — Telephone Encounter (Signed)
There are other similar notes/emails in the chart about this.  Pt needs psychiatry for the panic attacks and this is not a parkinsons or neurologic problem.  Re: BP, f/u with PCP

## 2019-09-03 NOTE — Telephone Encounter (Signed)
Patient has a virtual visit with Dr Selena Batten 09/03/19

## 2019-09-03 NOTE — Telephone Encounter (Signed)
Pt is not sure if medication is causing panic attacks, would like advice.

## 2019-09-04 ENCOUNTER — Other Ambulatory Visit: Payer: Self-pay

## 2019-09-04 NOTE — Progress Notes (Signed)
Pt is scheduled on 09/05/19 at 9:30 am

## 2019-09-05 ENCOUNTER — Ambulatory Visit (INDEPENDENT_AMBULATORY_CARE_PROVIDER_SITE_OTHER): Payer: Medicare PPO | Admitting: *Deleted

## 2019-09-05 DIAGNOSIS — E538 Deficiency of other specified B group vitamins: Secondary | ICD-10-CM

## 2019-09-05 MED ORDER — CYANOCOBALAMIN 1000 MCG/ML IJ SOLN
1000.0000 ug | Freq: Once | INTRAMUSCULAR | Status: AC
  Administered 2019-09-05: 1000 ug via INTRAMUSCULAR

## 2019-09-05 NOTE — Progress Notes (Signed)
Per orders of Dr. Ardyth Harps, injection of B12 given by Kern Reap. Patient tolerated injection well.  Patient requested a blood pressure check:  96/64

## 2019-09-06 ENCOUNTER — Telehealth: Payer: Self-pay | Admitting: Internal Medicine

## 2019-09-06 DIAGNOSIS — F4323 Adjustment disorder with mixed anxiety and depressed mood: Secondary | ICD-10-CM | POA: Diagnosis not present

## 2019-09-06 NOTE — Telephone Encounter (Signed)
I contacted her back again today and she is going to call her PCP for recommendations

## 2019-09-06 NOTE — Telephone Encounter (Signed)
Pt states she is confused on the dosage of the Clonazepam. She takes half a pill in the morning and the other half at night according to her last bottle. Her new bottle states to take one pill at night. Pt states she spoke to Dr. Arbutus Leas and she referred her to speak to her PCP about this. She is confused on the dosage for the clonazepam and she wants to be sure she is okay to take the melatonin along with it at night?   She saw Dr. Selena Batten on 4/6 and states that she saw the whole panic attack during their virtual visit and had already taken half a pill before the visit but Selena Batten told her to take the other half and she felt relief after it kicked in. Pt is nervous of the side effects of it and would like advice from her PCP. One of the side effects that makes her nervous is more panic attacks.   Pt will be out of town starting tomorrow and she is scared about leaving without having any medical attention.   Pt can be reached at [phone] -ok to leave a detailed message per pt

## 2019-09-06 NOTE — Telephone Encounter (Signed)
Pt called states needs confirmation on instructions how to take Klonopin each day. Pt called states saw therapist "Sharl Ma" this morning and yesterday. Pt states no recommendation on dosage or meds. Pt request return call.

## 2019-09-10 ENCOUNTER — Other Ambulatory Visit: Payer: Self-pay | Admitting: Neurology

## 2019-09-10 ENCOUNTER — Encounter: Payer: Medicare PPO | Admitting: Occupational Therapy

## 2019-09-10 NOTE — Telephone Encounter (Signed)
Patient is aware 

## 2019-09-10 NOTE — Telephone Encounter (Signed)
Looks like the prescriber is Dr. Arbutus Leas and instructions are for 1 tablet at bedtime.

## 2019-09-11 ENCOUNTER — Ambulatory Visit: Payer: Medicare PPO | Admitting: Physical Therapy

## 2019-09-11 ENCOUNTER — Ambulatory Visit: Payer: Medicare PPO | Admitting: Occupational Therapy

## 2019-09-12 ENCOUNTER — Encounter: Payer: Medicare PPO | Admitting: Occupational Therapy

## 2019-09-13 ENCOUNTER — Other Ambulatory Visit: Payer: Self-pay

## 2019-09-13 ENCOUNTER — Ambulatory Visit: Payer: Medicare PPO | Attending: Obstetrics and Gynecology | Admitting: Occupational Therapy

## 2019-09-13 ENCOUNTER — Ambulatory Visit: Payer: Medicare PPO | Admitting: Physical Therapy

## 2019-09-13 ENCOUNTER — Encounter: Payer: Self-pay | Admitting: Occupational Therapy

## 2019-09-13 VITALS — BP 100/68 | HR 96

## 2019-09-13 DIAGNOSIS — M25631 Stiffness of right wrist, not elsewhere classified: Secondary | ICD-10-CM

## 2019-09-13 DIAGNOSIS — M25632 Stiffness of left wrist, not elsewhere classified: Secondary | ICD-10-CM | POA: Insufficient documentation

## 2019-09-13 DIAGNOSIS — R41840 Attention and concentration deficit: Secondary | ICD-10-CM

## 2019-09-13 DIAGNOSIS — R2689 Other abnormalities of gait and mobility: Secondary | ICD-10-CM | POA: Insufficient documentation

## 2019-09-13 DIAGNOSIS — R29818 Other symptoms and signs involving the nervous system: Secondary | ICD-10-CM

## 2019-09-13 DIAGNOSIS — R2681 Unsteadiness on feet: Secondary | ICD-10-CM | POA: Insufficient documentation

## 2019-09-13 DIAGNOSIS — R293 Abnormal posture: Secondary | ICD-10-CM | POA: Diagnosis not present

## 2019-09-13 DIAGNOSIS — R278 Other lack of coordination: Secondary | ICD-10-CM | POA: Insufficient documentation

## 2019-09-13 DIAGNOSIS — M6281 Muscle weakness (generalized): Secondary | ICD-10-CM

## 2019-09-13 NOTE — Therapy (Signed)
[facility] [address] [place], [place], [address] Phone: [phone]   Fax:  [phone]  Physical Therapy Treatment  Patient Details  Name: Kelly Howard MRN: [id number] Date of Birth: June 23, 1950 Referring Provider (PT): Siddiqui (Pt is seeing Dr. Carles Collet for first visit 05/01/2019)   Encounter Date: 09/13/2019  PT End of Session - 09/13/19 1839    Visit Number  13   no charge - pt with incr anxiety/panic attacks   Number of Visits  21    Date for PT Re-Evaluation  29/51/88   60 day cert for 4 week addition to [facility] request completed 06/24/2019    PT Start Time  0941   pt arrived late   PT Stop Time  1015    PT Time Calculation (min)  34 min    Activity Tolerance  Other (comment)   limited by anxiety   Behavior During Therapy  Restless;Anxious   fidgety throughout session, asks multiple questions; needs frequent redirection      Past Medical History:  Diagnosis Date  . Frequent falls   . Hypothyroidism   . Osteoporosis   . Parkinson's disease [facility])     Past Surgical History:  Procedure Laterality Date  . TONSILLECTOMY    . WRIST SURGERY      Vitals:   09/13/19 0950  BP: 100/68  Pulse: 96    Subjective Assessment - 09/13/19 0945    Subjective  Feeling very anxious this morning. Thinks she is having a panic attack. Dr. Carles Collet said 2 days ago to take the full clonezapam and now is not feelin right - states she feels like she needs to take another one, but does not have a pill with her.    Patient Stated Goals  Pt's goal for therapy is to walk without fear of falling.    Pain Onset  More than a month ago            Pt arrives to therapy session late and very anxious and states that she feels like she is having a panic attack. Took vitals (see above) with slightly elevated HR. Pt states she is anxious about her medication about having to take a full  clonezepam pill at night vs. 1/2 (pt states this was changed by Dr. Carles Collet a couple days ago) and feels like she needs to take another pill right now. Instructed pt in deep breathing exercise in quiet treatment room with breathing in through the nose and out through the mouth (as pt initially only performing mouth breathing) x2 minutes. Pt still very anxious and wanting husband to be in the room to help calm her down. Therapist tried calling pt's husband (who was out in the car) but did not pick up the phone. Instead walked with pt out in the gym to perform the SciFit x5 minutes and gear 1.5 for 5 minutes with pt reporting feeling better and wanting to continue with therapy. Immediately after finishing SciFit, pt becomes tearful about anxiety/feeling like she is having a panic attack. Walked back to room with pt and continued to cue for deep breathing with pt's husband present at end of session. Husband reports that pt has been having panic attacks and that neurologist/PCP are addressing and that pt has an upcoming psychiatrist appt. Therapist discussing to call PCP regarding pt's medications, both verbalized understanding. Pt agreeable to try to stay for next OT session.  PT Short Term Goals - 07/23/19 1517      PT SHORT TERM GOAL #1   Title  Pt will be independent with HEP to address balance, transfers, gait.  TARGET 07/05/2019 (EXTENDED due to pt missing appointments for L wrist surgery):  new target 07/26/2019    Time  2    Period  Weeks    Status  Achieved      PT SHORT TERM GOAL #2   Title  Pt will perform sit<>stand at least 8 of 10 reps from low surfaces, simulating sofa, with no posterior lean, modified independently.    Baseline  1 episode of posterior lean, cues throughout practice    Time  2    Period  Weeks    Status  Partially Met      PT SHORT TERM GOAL #3   Title  Pt will verbalize understanding of fall prevention in home environment.    Baseline   Unable to fully address, due to pt's ongoing questions being addressed during therapy sessions    Time  2    Period  Weeks    Status  On-going        PT Long Term Goals - 08/23/19 1509      PT LONG TERM GOAL #1   Title  Pt will be independent with HEP for Parkinson's specific exercises, to address posture, transfers, balance, gait.  TARGET  10/04/2019(pt will return wk of 09/11/2019 due to being out of town)    Baseline  HEP has been initiated and reviewed; unsure that pt is performing consistently at home; could perform and add PWR! Moves    Time  4    Period  Weeks    Status  Revised      PT LONG TERM GOAL #2   Title  Pt will verbalize understanding of means of obtaining U-step RW for use at home and in community.    Time  4    Period  Weeks    Status  Revised      PT LONG TERM GOAL #3   Title  Pt will perform gait using U-step RW around obstacles, through doorways, and tight turning spaces in gym area, with supervision, simulating home situations.    Time  4    Period  Weeks    Status  Revised      PT LONG TERM GOAL #4   Title  Pt will verbalize plans for ongoing community fitness upon d/c from PT.    Time  4    Period  Weeks    Status  On-going      PT LONG TERM GOAL #5   Title  Pt will ambulate at least 1000 ft, indoors and outdoor surfaces, using U-step RW,with supervision for improved safety with community gait.    Time  4    Period  Weeks    Status  New            Plan - 09/13/19 1847    Clinical Impression Statement  Pt with increased anxiety throughout today's session and unable to participate in therapy. Attempted deep breathing and SciFit as use for aerobic activity, but pt still with increased anxiety. Pt's husband present at end of session stating pt has been having panic attacks that pt's neurologist/PCP are addressing. Will continue to progress towards LTGs when appropriate.    Personal Factors and Comorbidities  Behavior Pattern;Comorbidity 3+     Comorbidities  osteoporosis, R wrist fracture (11/2018), L  wrist fracture (04/22/2019), hx of rib fracture    Examination-Activity Limitations  Stand;Stairs;Transfers;Locomotion Level    Examination-Participation Restrictions  Community Activity   exercise   Stability/Clinical Decision Making  Evolving/Moderate complexity    Rehab Potential  Good    PT Frequency  2x / week    PT Duration  4 weeks   per recert 8/45/3646   PT Treatment/Interventions  ADLs/Self Care Home Management;Gait training;Stair training;Functional mobility training;Therapeutic activities;Therapeutic exercise;Balance training;Patient/family education;Neuromuscular re-education;DME Instruction    PT Next Visit Plan  Need to train with use of U-step walker (I incorrectly discussed Upwalker in last tx plan, but it should be U-STEP walker), including outdoors and inside with obstacle negotiation.  Need to discuss ordering (she will likely need to call number on U-[email or web address] website for ordering, as no vendors in this area participate with U-step walker to my knowlededge).  Work on standing balance exercises for heelstrike, foot clearance, weightshifting with additions made to HEP as appropriate.    Consulted and Agree with Plan of Care  Patient       Patient will benefit from skilled therapeutic intervention in order to improve the following deficits and impairments:  Abnormal gait, Decreased balance, Decreased mobility, Decreased knowledge of precautions, Decreased coordination, Difficulty walking, Decreased safety awareness, Decreased strength, Postural dysfunction  Visit Diagnosis: Unsteadiness on feet  Other abnormalities of gait and mobility     Problem List Patient Active Problem List   Diagnosis Date Noted  . Vitamin D deficiency 07/10/2019  . Vitamin B12 deficiency 07/10/2019  . Hypothyroidism   . Parkinson's disease ([facility])   . Frequent falls   . Osteoporosis     Arliss Journey, PT, DPT  09/13/2019, 6:48  PM  [facility] [address] [place], [place], [address] Phone: [phone]   Fax:  [phone]  Name: Kelly Howard MRN: [id number] Date of Birth: [birth year]

## 2019-09-13 NOTE — Therapy (Signed)
[facility] [address] [place], [place], [address] Phone: [phone]   Fax:  [phone]  Occupational Therapy Treatment  Patient Details  Name: Kelly Howard MRN: [id number] Date of Birth: 05-15-1951 Referring Provider (OT): Dr.Tat, Dr. Rubin Payor   Encounter Date: 09/13/2019  OT End of Session - 09/13/19 1030    Visit Number  2    Number of Visits  17    Date for OT Re-Evaluation  10/07/19    Authorization Type  [employer]    Authorization - Visit Number  2    Authorization - Number of Visits  10    Progress Note Due on Visit  10    OT Start Time  1021    OT Stop Time  1100    OT Time Calculation (min)  39 min    Activity Tolerance  Patient tolerated treatment well    Behavior During Therapy  Impulsive;Restless       Past Medical History:  Diagnosis Date  . Frequent falls   . Hypothyroidism   . Osteoporosis   . Parkinson's disease [facility])     Past Surgical History:  Procedure Laterality Date  . TONSILLECTOMY    . WRIST SURGERY      There were no vitals filed for this visit.  Subjective Assessment - 09/13/19 1025    Subjective   Pt reports that she feels better (but was very anxious during PT session).  Husband reports that pt has been having panic attacks and that neurologist/PCP are addressing and that pt has an upcoming psychiatrist appt.    Currently in Pain?  Yes    Pain Score  3     Pain Location  Head    Pain Orientation  Left    Pain Descriptors / Indicators  Aching    Pain Type  Acute pain    Pain Onset  More than a month ago    Pain Frequency  Intermittent    Aggravating Factors   nothing    Pain Relieving Factors  nothing         Pt anxious and tearful during session, but reports that she felt better by end of session.  Pt expresses concern about PD prognosis.  Therefore, session focused on answering pt's question regarding PD, providing reassurance, and strategies/AE for  ADLs/IADLs including:  -- recommended use of kitchen stool for fatigue with meal prep, also discussed using chopper to decr prep time. -- pt reports concerns with falling out of bed (but has not fallen out of bed)--discussed that if this becomes a problem, bedrails are helpful, also discussed lowering bed due to pt reports of difficulty getting in bed (options may include changing mattresses, getting smaller box springs or eliminating box springs depending on bed frame. -- pt reports that she feels dehydrated although she drinks a lot of water, recommended considering body armour-type sports drink --pt reports concerns regarding cognition and dementia--pt educated in typical changes in cognition related to PD (bradyphrenia, STM deficits, decr divided attention, and aphasia).  Pt asks about what she can do to address--discussed reducing multi-tasking in situations where she may fall (as she reports multi-tasking contributed to falls in the past with wrist injuries) such not carrying items or talking when going down stairs or with uneven surfaces, pre-plan cooking so that she prepares prior to starting, and then doing a variety of safe activities for cognitive skills (including [email or web address], word searches, crosswords, logic/word/number puzzles, etc). --pt wrote notes during session  and wrote with approx 85% legibility (legibility decr as pt wrote).   --discussed planning a coping strategies for "panic attacks" until she can be further evaluated including deep breathing (10 deep breaths--in through nose/out through the mouth, counting backwards, calm app, distraction).  Recommended discussing with husband so that he can assist.   --Also discussed focusing on one task at a time to decr frustration/anxiety.     Pt verbalized understanding of all education/recommendations provided.    OT Short Term Goals - 08/08/19 1509      OT SHORT TERM GOAL #1   Title  I with HEP.    Time  4    Period  Weeks     Status  New    Target Date  09/07/19      OT SHORT TERM GOAL #2   Title  I with adaptes strategies to maximize safety and I with ADLs/ IADLs.    Time  4    Period  Weeks    Status  New      OT SHORT TERM GOAL #3   Title  Pt will demonstrate improved LUE functional use as evidenced by increasing box/ blocks score by 4 blocks.    Baseline  RUE 48, LUE39    Time  4    Period  Weeks    Status  New      OT SHORT TERM GOAL #4   Title  Pt will write a short paragraph with 100% legibility and no significant decrease in letter size.    Time  4    Period  Weeks    Status  New        OT Long Term Goals - 08/08/19 1636      OT LONG TERM GOAL #1   Title  Pt will verbalize understanding of ways to prevent future PD complications.    Time  8    Period  Weeks    Status  New    Target Date  10/07/19      OT LONG TERM GOAL #2   Title  Pt will demonstrate ability to retrieve a lightweight object from overhead shelf with -15 elbow extension with RUE.    Time  8    Period  Weeks    Status  New      OT LONG TERM GOAL #3   Title  Pt will demonstrate ability to retrieve a lightweight object from overhead shelf with -20 elbow extension LUE    Time  8    Period  Weeks    Status  New      OT LONG TERM GOAL #4   Title  Pt will demonstrate wrist extension of at least 50 for bilateral wrists for improved functional use.    Baseline  RUE 45, LUE 15    Time  8    Period  Weeks    Status  New      OT LONG TERM GOAL #5   Title  Pt will demonstrate improved ease with dressing as eveidenced by decreasing 3 button/ unbutton time to 47 secs or less.    Baseline  51.87    Time  8    Period  Weeks    Status  New            Plan - 09/13/19 1038    Clinical Impression Statement  Pt reports anxiety today (felt like she couldn't breathe during PT session--see PT notes for details), but felt better by  OT session.  However, pt did feel very anxious and was tearful during session today.   Therefore, session focused on education and strategies for ADLs/coping and answering pt questions about PD.    OT Occupational Profile and History  Problem Focused Assessment - Including review of records relating to presenting problem    Occupational performance deficits (Please refer to evaluation for details):  ADL's;IADL's;Leisure;Social Participation    Body Structure / Function / Physical Skills  ADL;Balance;Mobility;Strength;Flexibility;FMC;Coordination;Decreased knowledge of precautions;GMC;ROM;Decreased knowledge of use of DME;Dexterity;IADL    Cognitive Skills  Attention;Emotional;Memory;Problem Solve;Sequencing    Rehab Potential  Good    Clinical Decision Making  Limited treatment options, no task modification necessary    Comorbidities Affecting Occupational Performance:  May have comorbidities impacting occupational performance    Modification or Assistance to Complete Evaluation   No modification of tasks or assist necessary to complete eval    OT Frequency  2x / week    OT Duration  8 weeks   plus eval   OT Treatment/Interventions  Self-care/ADL training;Aquatic Therapy;Therapist, nutritional;Therapeutic exercise;Balance training;Splinting;Manual Therapy;Neuromuscular education;Ultrasound;Therapeutic activities;Energy conservation;Paraffin;DME and/or AE instruction;Cognitive remediation/compensation;Fluidtherapy;Gait Training;Moist Heat;Contrast Bath;Passive range of motion;Patient/family education    Plan  no precautions for LUE (full wt. bearing and ROM, no brace needed), strategies for ADLs, initiate HEP    Consulted and Agree with Plan of Care  Patient;Family member/caregiver    Family Member Consulted  husband present at beginning of session       Patient will benefit from skilled therapeutic intervention in order to improve the following deficits and impairments:   Body Structure / Function / Physical Skills: ADL, Balance, Mobility, Strength, Flexibility, FMC,  Coordination, Decreased knowledge of precautions, GMC, ROM, Decreased knowledge of use of DME, Dexterity, IADL Cognitive Skills: Attention, Emotional, Memory, Problem Solve, Sequencing     Visit Diagnosis: Other lack of coordination  Other symptoms and signs involving the nervous system  Stiffness of left wrist, not elsewhere classified  Stiffness of right wrist, not elsewhere classified  Muscle weakness (generalized)  Abnormal posture  Attention and concentration deficit    Problem List Patient Active Problem List   Diagnosis Date Noted  . Vitamin D deficiency 07/10/2019  . Vitamin B12 deficiency 07/10/2019  . Hypothyroidism   . Parkinson's disease ([place])   . Frequent falls   . Osteoporosis     [facility] 09/13/2019, 2:09 PM  [facility] [address] [place], [place], [address] Phone: [phone]   Fax:  [phone]  Name: Loyda Costin MRN: [id number] Date of Birth: 01-23-1951   Vianne Bulls, OTR/L [facility] [address] [place], [place]  [address] [phone] phone [phone] 09/13/19 2:09 PM

## 2019-09-17 ENCOUNTER — Encounter: Payer: Self-pay | Admitting: Physical Therapy

## 2019-09-17 ENCOUNTER — Encounter: Payer: Self-pay | Admitting: Occupational Therapy

## 2019-09-17 ENCOUNTER — Ambulatory Visit: Payer: Medicare PPO | Admitting: Physical Therapy

## 2019-09-17 ENCOUNTER — Other Ambulatory Visit: Payer: Self-pay

## 2019-09-17 ENCOUNTER — Ambulatory Visit: Payer: Medicare PPO | Admitting: Occupational Therapy

## 2019-09-17 DIAGNOSIS — M25631 Stiffness of right wrist, not elsewhere classified: Secondary | ICD-10-CM

## 2019-09-17 DIAGNOSIS — M6281 Muscle weakness (generalized): Secondary | ICD-10-CM | POA: Diagnosis not present

## 2019-09-17 DIAGNOSIS — R2689 Other abnormalities of gait and mobility: Secondary | ICD-10-CM | POA: Diagnosis not present

## 2019-09-17 DIAGNOSIS — M25632 Stiffness of left wrist, not elsewhere classified: Secondary | ICD-10-CM | POA: Diagnosis not present

## 2019-09-17 DIAGNOSIS — R29818 Other symptoms and signs involving the nervous system: Secondary | ICD-10-CM | POA: Diagnosis not present

## 2019-09-17 DIAGNOSIS — R2681 Unsteadiness on feet: Secondary | ICD-10-CM

## 2019-09-17 DIAGNOSIS — R293 Abnormal posture: Secondary | ICD-10-CM

## 2019-09-17 DIAGNOSIS — R278 Other lack of coordination: Secondary | ICD-10-CM

## 2019-09-17 DIAGNOSIS — R41840 Attention and concentration deficit: Secondary | ICD-10-CM

## 2019-09-17 NOTE — Therapy (Signed)
[facility] [address] [place], [place], [address] Phone: [phone]   Fax:  [phone]  Occupational Therapy Treatment  Patient Details  Name: Kelly Howard MRN: [id number] Date of Birth: 1950-09-18 Referring Provider (OT): Dr.Tat, Dr. Linus Mako   Encounter Date: 09/17/2019  OT End of Session - 09/17/19 1110    Visit Number  3    Number of Visits  17    Date for OT Re-Evaluation  10/07/19    Authorization Type  [employer]    Authorization - Visit Number  3    Authorization - Number of Visits  10    Progress Note Due on Visit  10    OT Start Time  1108    OT Stop Time  1148    OT Time Calculation (min)  40 min    Activity Tolerance  Patient tolerated treatment well    Behavior During Therapy  Impulsive;Restless       Past Medical History:  Diagnosis Date  . Frequent falls   . Hypothyroidism   . Osteoporosis   . Parkinson's disease [facility])     Past Surgical History:  Procedure Laterality Date  . TONSILLECTOMY    . WRIST SURGERY      There were no vitals filed for this visit.  Subjective Assessment - 09/17/19 1109    Subjective   "I feel better"    Currently in Pain?  No/denies    Pain Onset  More than a month ago       Pt continues to c/o anxiety and is fearful/has anxiety about family coming over this afternoon/this week, medications, PD prognosis, and moving, her mother.  Pt perseverates on these things and requires redirection.  However, also recommended that pt have a plan/strategy for when she feels anxious when family is there (deep breathing, walking, listening to music, lying down, etc) and discuss this with husband so that he can cue her to use her strategy if she begins to have a panic attack.  Also reviewed medication instructions listed in Epic and recommended pt contact MD if further questions.  Recommended pt write down list of questions for upcoming psychiatrist appt.  Recommended pt  discuss coping strategies with counselor.  Recommended pt plan schedule around on/off times and if traveling, plan to stop to stretch, eat, walk, etc during "off" time (as pt reports that this is an issue with traveling and anxiety).     Assessed pt's wrist extension--LTG met  (see goal section below).  Pt educated in adaptive strategy for cutting food and simulated cutting food with fork/knife (using putty).  Pt able to return demo strategy after instruction.   Pt instructed in strategy for doffing shirt as pt reports difficulty and getting "stuck" and feeling anxious. (see pt instruction).  Pt verbalized understanding.         OT Education - 09/17/19 1214    Education Details  Strategies for ADLs--see pt instructions    Person(s) Educated  Patient    Methods  Explanation;Handout;Demonstration    Comprehension  Verbalized understanding       OT Short Term Goals - 09/17/19 1111      OT SHORT TERM GOAL #1   Title  I with HEP.    Time  4    Period  Weeks    Status  New    Target Date  09/07/19      OT SHORT TERM GOAL #2   Title  I with adaptes  strategies to maximize safety and I with ADLs/ IADLs.    Time  4    Period  Weeks    Status  New      OT SHORT TERM GOAL #3   Title  Pt will demonstrate improved LUE functional use as evidenced by increasing box/ blocks score by 4 blocks.    Baseline  RUE 48, LUE39    Time  4    Period  Weeks    Status  New      OT SHORT TERM GOAL #4   Title  Pt will write a short paragraph with 100% legibility and no significant decrease in letter size.    Time  4    Period  Weeks    Status  New        OT Long Term Goals - 09/17/19 1113      OT LONG TERM GOAL #1   Title  Pt will verbalize understanding of ways to prevent future PD complications.    Time  8    Period  Weeks    Status  New      OT LONG TERM GOAL #2   Title  Pt will demonstrate ability to retrieve a lightweight object from overhead shelf with -15 elbow extension with  RUE.    Time  8    Period  Weeks    Status  New      OT LONG TERM GOAL #3   Title  Pt will demonstrate ability to retrieve a lightweight object from overhead shelf with -20 elbow extension LUE    Time  8    Period  Weeks    Status  New      OT LONG TERM GOAL #4   Title  Pt will demonstrate wrist extension of at least 50 for bilateral wrists for improved functional use.    Baseline  RUE 45, LUE 15    Time  8    Period  Weeks    Status  Achieved   09/17/19:  R 65*, L 60*     OT LONG TERM GOAL #5   Title  Pt will demonstrate improved ease with dressing as eveidenced by decreasing 3 button/ unbutton time to 47 secs or less.    Baseline  51.87    Time  8    Period  Weeks    Status  New            Plan - 09/17/19 1110    Clinical Impression Statement  Pt verbalized understanding of adaptive strategies for doffing shirt and cutting food.  Pt continues to report significant anxiety which is a barrier to progress.    OT Occupational Profile and History  Problem Focused Assessment - Including review of records relating to presenting problem    Occupational performance deficits (Please refer to evaluation for details):  ADL's;IADL's;Leisure;Social Participation    Body Structure / Function / Physical Skills  ADL;Balance;Mobility;Strength;Flexibility;FMC;Coordination;Decreased knowledge of precautions;GMC;ROM;Decreased knowledge of use of DME;Dexterity;IADL    Cognitive Skills  Attention;Emotional;Memory;Problem Solve;Sequencing    Rehab Potential  Good    Clinical Decision Making  Limited treatment options, no task modification necessary    Comorbidities Affecting Occupational Performance:  May have comorbidities impacting occupational performance    Modification or Assistance to Complete Evaluation   No modification of tasks or assist necessary to complete eval    OT Frequency  2x / week    OT Duration  8 weeks   plus eval     OT Treatment/Interventions  Self-care/ADL  training;Aquatic Therapy;Therapist, nutritional;Therapeutic exercise;Balance training;Splinting;Manual Therapy;Neuromuscular education;Ultrasound;Therapeutic activities;Energy conservation;Paraffin;DME and/or AE instruction;Cognitive remediation/compensation;Fluidtherapy;Gait Training;Moist Heat;Contrast Bath;Passive range of motion;Patient/family education    Plan  strategies for ADLs, initiate HEP/PWR! moves, functional reaching    Consulted and Agree with Plan of Care  Patient;Family member/caregiver    Family Member Consulted  husband present at beginning of session       Patient will benefit from skilled therapeutic intervention in order to improve the following deficits and impairments:   Body Structure / Function / Physical Skills: ADL, Balance, Mobility, Strength, Flexibility, FMC, Coordination, Decreased knowledge of precautions, GMC, ROM, Decreased knowledge of use of DME, Dexterity, IADL Cognitive Skills: Attention, Emotional, Memory, Problem Solve, Sequencing     Visit Diagnosis: Other lack of coordination  Other symptoms and signs involving the nervous system  Stiffness of left wrist, not elsewhere classified  Stiffness of right wrist, not elsewhere classified  Muscle weakness (generalized)  Abnormal posture  Attention and concentration deficit  Unsteadiness on feet  Other abnormalities of gait and mobility    Problem List Patient Active Problem List   Diagnosis Date Noted  . Vitamin D deficiency 07/10/2019  . Vitamin B12 deficiency 07/10/2019  . Hypothyroidism   . Parkinson's disease ([place])   . Frequent falls   . Osteoporosis     [facility] 09/17/2019, 1:57 PM  [facility] [address] [place], [place], [address] Phone: [phone]   Fax:  [phone]  Name: Kelly Howard MRN: [id number] Date of Birth: [birth year]   Vianne Bulls, OTR/L [facility] [address] [place], [place]  [address] [phone] phone [phone] 09/17/19 2:00 PM

## 2019-09-17 NOTE — Patient Instructions (Signed)
    1. When cutting, try getting the tip of your knife down in front of what you are cutting and try to slice with big movement.  2.  When taking shirt off:  Sit down, reach behind your head and hold neck of the shirt with both hands, lean over and pull shirt off head completely, then pull shirt off arms.  3.  Write down list of questions to ask psychiatrist.

## 2019-09-18 NOTE — Therapy (Addendum)
[facility] [address] [place], [place], [address] Phone: [phone]   Fax:  [phone]  Physical Therapy Treatment  Patient Details  Name: Kelly Howard MRN: [id number] Date of Birth: 02-06-1951 Referring Provider (PT): Siddiqui (Pt is seeing Dr. Carles Collet for first visit 05/01/2019)   Encounter Date: 09/17/2019  PT End of Session - 09/18/19 1129    Visit Number  14    Number of Visits  21    Date for PT Re-Evaluation  46/56/81   60 day cert for 4 week addition to [place] request completed 06/24/2019    PT Start Time  1233    PT Stop Time  1314    PT Time Calculation (min)  41 min    Activity Tolerance  Patient tolerated treatment well    Behavior During Therapy  [facility] for tasks assessed/performed;Anxious   fidgety at times throughout session      Past Medical History:  Diagnosis Date  . Frequent falls   . Hypothyroidism   . Osteoporosis   . Parkinson's disease [facility])     Past Surgical History:  Procedure Laterality Date  . TONSILLECTOMY    . WRIST SURGERY      There were no vitals filed for this visit.  Subjective Assessment - 09/17/19 1236    Subjective  Doing well, no falls. Feeling a lot less anxious today. Has been riding the bike more at home and that makes her feel better. Still having trouble getitng out of the chair.    Patient Stated Goals  Pt's goal for therapy is to walk without fear of falling.    Currently in Pain?  No/denies    Pain Onset  More than a month ago                       [facility] Adult PT Treatment/Exercise - 09/18/19 0001      Transfers   Sit to Stand  5: Supervision;Without upper extremity assist;From bed    Stand to Sit  5: Supervision;Without upper extremity assist;To bed    Transfer Cueing  Pt needing frequent demonstrative and verbal cues throughout session when performing turns, due to pt performing too quickly and  coming up onto toes and "spinning" towards mat table, educated on proper technique to slow down and increase step width in order to decr fall risk, will continue to practice turns with U-Step RW    Comments  cues to release hands from U-step brakes to lock walker prior to sitting       Ambulation/Gait   Ambulation/Gait  Yes    Ambulation/Gait Assistance  5: Supervision;4: Min guard    Ambulation/Gait Assistance Details  Educated pt on purpose and use of U-Step RW to increase safety with gait in the community for longer distances and for household ambulation on pt's ground floor. Needed initial cues for heel > toe pattern and keeping feet under the basket to maintain close proximity to walker and facilitate more upright posture. Educated on Aeronautical engineer (squeezing brakes to go and letting go to stop with both hands). Pt at times when ambulating to reach and fix her hair with one hand while other hand still pushing, pt needing cues to make sure to stop and let go with both hands to make sure walker is stopped to decr fall risk. Practiced 3 x 30' bouts of obstacle negotiation weaving in and out of cones. Ambulated  outdoors over paved surfaces with cues to gently let go of the breaks when descending down inclines, as pt with tendency to perform too quickly. Practiced making sure breaks are locked and then safely turning to sit on walker seated for a seated rest break x4 reps throughout session with supervision.    Ambulation Distance (Feet)  700 Feet   approx. throughout session   Assistive device  Other (Comment)   U-Step RW   Gait Pattern  Step-through pattern;Decreased dorsiflexion - right;Decreased dorsiflexion - left    Ambulation Surface  Level;Indoor;Unlevel;Outdoor;Paved             PT Education - 09/18/19 1128    Education Details  gait and transfer training with U-step walker, provided handout and phone call to order (for pt to give to husband)    Person(s) Educated  Patient     Methods  Explanation;Demonstration;Handout    Comprehension  Verbalized understanding;Returned demonstration;Need further instruction;Verbal cues required       PT Short Term Goals - 07/23/19 0952      PT SHORT TERM GOAL #1   Title  Pt will be independent with HEP to address balance, transfers, gait.  TARGET 07/05/2019 (EXTENDED due to pt missing appointments for L wrist surgery):  new target 07/26/2019    Time  2    Period  Weeks    Status  Achieved      PT SHORT TERM GOAL #2   Title  Pt will perform sit<>stand at least 8 of 10 reps from low surfaces, simulating sofa, with no posterior lean, modified independently.    Baseline  1 episode of posterior lean, cues throughout practice    Time  2    Period  Weeks    Status  Partially Met      PT SHORT TERM GOAL #3   Title  Pt will verbalize understanding of fall prevention in home environment.    Baseline  Unable to fully address, due to pt's ongoing questions being addressed during therapy sessions    Time  2    Period  Weeks    Status  On-going        PT Long Term Goals - 08/23/19 1509      PT LONG TERM GOAL #1   Title  Pt will be independent with HEP for Parkinson's specific exercises, to address posture, transfers, balance, gait.  TARGET  10/04/2019(pt will return wk of 09/11/2019 due to being out of town)    Baseline  HEP has been initiated and reviewed; unsure that pt is performing consistently at home; could perform and add PWR! Moves    Time  4    Period  Weeks    Status  Revised      PT LONG TERM GOAL #2   Title  Pt will verbalize understanding of means of obtaining U-step RW for use at home and in community.    Time  4    Period  Weeks    Status  Revised      PT LONG TERM GOAL #3   Title  Pt will perform gait using U-step RW around obstacles, through doorways, and tight turning spaces in gym area, with supervision, simulating home situations.    Time  4    Period  Weeks    Status  Revised      PT LONG TERM GOAL #4    Title  Pt will verbalize plans for ongoing community fitness upon d/c from PT.  Time  4    Period  Weeks    Status  On-going      PT LONG TERM GOAL #5   Title  Pt will ambulate at least 1000 ft, indoors and outdoor surfaces, using U-step RW,with supervision for improved safety with community gait.    Time  4    Period  Weeks    Status  New            Plan - 09/18/19 1135    Clinical Impression Statement  Focus of today's skilled session was gait training with U-Step RW (Dr. Carles Collet has given pt order for this) for increased safety with gait due to pt's history of falls. Educated pt on purpose on RW and safety indications for individuals with PD. Pt needing intermittent cues throughout session to slow gait down as pt with tendency to increase speed, especially when performing turns (as pt has tendency to go up on toes and turn to sit to mat). Reviewed proper turning technique in order to decr fall risk and safety - pt will need continued practice. With U-Step RW, pt does demonstrate improved heel > toe pattern and posture with gait over indoor and unlevel paved surfaces outdoors, with pt also reporting feeling more steady. Provided pt with handout of website and phone number to call about acquiring RW. Will continue to practice with U-Step RW throughout future sessions to decr fall risk.    Personal Factors and Comorbidities  Behavior Pattern;Comorbidity 3+    Comorbidities  osteoporosis, R wrist fracture (11/2018), L wrist fracture (04/22/2019), hx of rib fracture    Examination-Activity Limitations  Stand;Stairs;Transfers;Locomotion Level    Examination-Participation Restrictions  Community Activity   exercise   Stability/Clinical Decision Making  Evolving/Moderate complexity    Rehab Potential  Good    PT Frequency  2x / week    PT Duration  4 weeks   per recert 9/89/2119   PT Treatment/Interventions  ADLs/Self Care Home Management;Gait training;Stair training;Functional mobility  training;Therapeutic activities;Therapeutic exercise;Balance training;Patient/family education;Neuromuscular re-education;DME Instruction    PT Next Visit Plan  continue training with U-step RW (especially turns, brake management when descending inclines), pt wants to try over gravel. have pt's husband present to help practice folding walker to put in to car. including outdoors and inside with obstacle negotiation.  Need to discuss ordering (she will likely need to call number on [email or web address] website for ordering, as no vendors in this area participate with U-step walker to my knowlededge).  Work on standing balance exercises for heelstrike, foot clearance, weightshifting with additions made to HEP as appropriate.    Consulted and Agree with Plan of Care  Patient       Patient will benefit from skilled therapeutic intervention in order to improve the following deficits and impairments:  Abnormal gait, Decreased balance, Decreased mobility, Decreased knowledge of precautions, Decreased coordination, Difficulty walking, Decreased safety awareness, Decreased strength, Postural dysfunction  Visit Diagnosis: Other lack of coordination  Other symptoms and signs involving the nervous system  Muscle weakness (generalized)  Abnormal posture  Unsteadiness on feet     Problem List Patient Active Problem List   Diagnosis Date Noted  . Vitamin D deficiency 07/10/2019  . Vitamin B12 deficiency 07/10/2019  . Hypothyroidism   . Parkinson's disease ([place])   . Frequent falls   . Osteoporosis     Arliss Journey , PT, DPT  09/18/2019, 11:44 AM  [facility] [address] [place], [place],  [address] Phone: [phone]   Fax:  [phone]  Name: Kelly Howard MRN: [id number] Date of Birth: 09/05/1950

## 2019-09-19 ENCOUNTER — Encounter: Payer: Self-pay | Admitting: Occupational Therapy

## 2019-09-19 ENCOUNTER — Ambulatory Visit: Payer: Medicare PPO | Admitting: Occupational Therapy

## 2019-09-19 ENCOUNTER — Encounter: Payer: Self-pay | Admitting: Physical Therapy

## 2019-09-19 ENCOUNTER — Ambulatory Visit: Payer: Medicare PPO | Admitting: Physical Therapy

## 2019-09-19 ENCOUNTER — Other Ambulatory Visit: Payer: Self-pay

## 2019-09-19 DIAGNOSIS — R41840 Attention and concentration deficit: Secondary | ICD-10-CM | POA: Diagnosis not present

## 2019-09-19 DIAGNOSIS — R2689 Other abnormalities of gait and mobility: Secondary | ICD-10-CM

## 2019-09-19 DIAGNOSIS — R2681 Unsteadiness on feet: Secondary | ICD-10-CM

## 2019-09-19 DIAGNOSIS — R29818 Other symptoms and signs involving the nervous system: Secondary | ICD-10-CM

## 2019-09-19 DIAGNOSIS — M25631 Stiffness of right wrist, not elsewhere classified: Secondary | ICD-10-CM | POA: Diagnosis not present

## 2019-09-19 DIAGNOSIS — R278 Other lack of coordination: Secondary | ICD-10-CM

## 2019-09-19 DIAGNOSIS — M6281 Muscle weakness (generalized): Secondary | ICD-10-CM | POA: Diagnosis not present

## 2019-09-19 DIAGNOSIS — R293 Abnormal posture: Secondary | ICD-10-CM

## 2019-09-19 DIAGNOSIS — M25632 Stiffness of left wrist, not elsewhere classified: Secondary | ICD-10-CM | POA: Diagnosis not present

## 2019-09-19 NOTE — Therapy (Signed)
[facility] [address] [place], [place], [address] Phone: [phone]   Fax:  [phone]  Physical Therapy Treatment/Discharge Summary  Patient Details  Name: Kelly Howard MRN: [id number] Date of Birth: 1950/06/10 Referring Provider (PT): Siddiqui (Pt is seeing Dr. Carles Collet for first visit 05/01/2019)   Encounter Date: 09/19/2019  PT End of Session - 09/19/19 1724    Visit Number  15    Number of Visits  21    Date for PT Re-Evaluation  17/35/67   60 day cert for 4 week addition to [place] request completed 06/24/2019    PT Start Time  1020    PT Stop Time  1104    PT Time Calculation (min)  44 min    Activity Tolerance  Patient tolerated treatment well    Behavior During Therapy  [facility] for tasks assessed/performed;Flat affect       Past Medical History:  Diagnosis Date  . Frequent falls   . Hypothyroidism   . Osteoporosis   . Parkinson's disease [facility])     Past Surgical History:  Procedure Laterality Date  . TONSILLECTOMY    . WRIST SURGERY      There were no vitals filed for this visit.  Subjective Assessment - 09/19/19 1110    Subjective  I'm doing okay.  My husband is going to try to order it today.  Have an appointment with psychiatrist, as I've had some panic attacks and would like to get my medication straight.  No falls, but near falls.    Patient Stated Goals  Pt's goal for therapy is to walk without fear of falling.    Currently in Pain?  No/denies    Pain Onset  More than a month ago                       [facility] Adult PT Treatment/Exercise - 09/19/19 1023      Transfers   Transfers  Sit to Stand;Stand to Sit    Sit to Stand  5: Supervision;Without upper extremity assist;From bed    Sit to Stand Details  Verbal cues for technique;Verbal cues for sequencing    Stand to Sit  5: Supervision;Without upper extremity assist;To bed    Stand  to Sit Details (indicate cue type and reason)  Verbal cues for technique;Verbal cues for sequencing    Transfer Cueing  Cues for hand placement for transfers    Comments  cues to release hands from U-step brakes to lock walker prior to sitting       Ambulation/Gait   Ambulation/Gait  Yes    Ambulation/Gait Assistance  5: Supervision    Ambulation/Gait Assistance Details  Pt demonstrates slowed pace with gait using U-step RW today.  Practiced indoor gait, including quick stops and starts and turning in place; pt needs cues for keeping feet within BOS of walker for safety with tight turns.  Practiced outdoor gait, including gait on sidewalk and inclines with cues for posture, heelstrike, and staying closed to U-step RW.  During outdoor gait, PT and pt ambulated to patient's car and spoke with husband.  PT reiterated education to husband on how to obtain U-step RW (was provided to patient last visit and they will need to call to order), benefits and indications for U-step RW as well as supervision initially for pt for use of U-step for safety.  Husband verbalizes understanding and states they will  order today.  Educated patient and husband on how to fold U-step for placing in car.  Discussed with pt/husband benefits of using rollator on longer distance, outdoor surfaces, as well as in home in tighter spaces with turns for overall improved safety with gait.    Ambulation Distance (Feet)  400 Feet   indoors, then >1000 ft outdoors/indoors   Assistive device  Other (Comment)   U-Step RW   Gait Pattern  Step-through pattern;Decreased arm swing - right;Decreased arm swing - left;Decreased dorsiflexion - right;Decreased dorsiflexion - left    Ambulation Surface  Level;Indoor      Self-Care   Self-Care  Other Self-Care Comments    Other Self-Care Comments   Reviewed fall prevention and provided another handout for fall prevention review.  Answered pt's questions regarding return to community fitness (PT  recommends conitnuing to use her bike at home, do HEP from therapy, and try to connect again with her personal trainer, prior to getting into group fitness classes.  Based on pt's reports of family stressors and increased travel so that husband can supervise her (to hopefully avoid falls), pt will need to cancel next week's visits.  Discussed that pt's limitations in frequency due to the above reasons, it may be beneficial at this point to d/c from PT for pt to focus on family and other appointments.  Pt verbalizes understanding and husband voices understanding of ordering U-step RW.             PT Education - 09/19/19 1724    Education Details  Fall prevention, education to husband about U-step RW, including turning safety, use of brakes, and how to fold to put in car.    Person(s) Educated  Patient;Spouse    Methods  Explanation    Comprehension  Verbalized understanding       PT Short Term Goals - 07/23/19 0952      PT SHORT TERM GOAL #1   Title  Pt will be independent with HEP to address balance, transfers, gait.  TARGET 07/05/2019 (EXTENDED due to pt missing appointments for L wrist surgery):  new target 07/26/2019    Time  2    Period  Weeks    Status  Achieved      PT SHORT TERM GOAL #2   Title  Pt will perform sit<>stand at least 8 of 10 reps from low surfaces, simulating sofa, with no posterior lean, modified independently.    Baseline  1 episode of posterior lean, cues throughout practice    Time  2    Period  Weeks    Status  Partially Met      PT SHORT TERM GOAL #3   Title  Pt will verbalize understanding of fall prevention in home environment.    Baseline  Unable to fully address, due to pt's ongoing questions being addressed during therapy sessions    Time  2    Period  Weeks    Status  On-going        PT Long Term Goals - 09/19/19 1725      PT LONG TERM GOAL #1   Title  Pt will be independent with HEP for Parkinson's specific exercises, to address posture,  transfers, balance, gait.  TARGET  10/04/2019(pt will return wk of 09/11/2019 due to being out of town)    Baseline  HEP has been initiated and reviewed; unsure that pt is performing consistently at home; could perform and add PWR! Moves    Time  4    Period  Weeks    Status  Not Met      PT LONG TERM GOAL #2   Title  Pt will verbalize understanding of means of obtaining U-step RW for use at home and in community.    Time  4    Period  Weeks    Status  Achieved      PT LONG TERM GOAL #3   Title  Pt will perform gait using U-step RW around obstacles, through doorways, and tight turning spaces in gym area, with supervision, simulating home situations.    Time  4    Period  Weeks    Status  Achieved      PT LONG TERM GOAL #4   Title  Pt will verbalize plans for ongoing community fitness upon d/c from PT.    Time  4    Period  Weeks    Status  Achieved      PT LONG TERM GOAL #5   Title  Pt will ambulate at least 1000 ft, indoors and outdoor surfaces, using U-step RW,with supervision for improved safety with community gait.    Time  4    Period  Weeks    Status  Achieved            Plan - 09/19/19 1726    Clinical Impression Statement  Focus of today's skilled PT session was further practice with U-Step RW, including indoors, outdoors, and education to husband on U-Step RW benefits and use.  Pt appears to be more slowly paced and more safe with use of U-step RW today, with just occsaional cues for keeping feet within BOS of U-Step RW.  Pt and husband verbalize understanding of education provided on U-step RW.  Due to pt's upcoming (and recent) travels and other conflicting things going on in her family life which has limited consistent attendance discussed discharging PT at this time to allow pt to better focus on those things.  DIscussed plans for return screens in 6-9 months.    Personal Factors and Comorbidities  Behavior Pattern;Comorbidity 3+    Comorbidities  osteoporosis, R  wrist fracture (11/2018), L wrist fracture (04/22/2019), hx of rib fracture    Examination-Activity Limitations  Stand;Stairs;Transfers;Locomotion Level    Examination-Participation Restrictions  Community Activity   exercise   Stability/Clinical Decision Making  Evolving/Moderate complexity    Rehab Potential  Good    PT Frequency  2x / week    PT Duration  4 weeks   per recert 7/65/4650   PT Treatment/Interventions  ADLs/Self Care Home Management;Gait training;Stair training;Functional mobility training;Therapeutic activities;Therapeutic exercise;Balance training;Patient/family education;Neuromuscular re-education;DME Instruction    PT Next Visit Plan  Discharge this visit; recommend PT screen in 6-9 months.    Consulted and Agree with Plan of Care  Patient;Family member/caregiver    Family Member Consulted  Husband educated as pt/PT walked out to parking lot during session       Patient will benefit from skilled therapeutic intervention in order to improve the following deficits and impairments:  Abnormal gait, Decreased balance, Decreased mobility, Decreased knowledge of precautions, Decreased coordination, Difficulty walking, Decreased safety awareness, Decreased strength, Postural dysfunction  Visit Diagnosis: Other abnormalities of gait and mobility  Unsteadiness on feet  Abnormal posture     Problem List Patient Active Problem List   Diagnosis Date Noted  . Vitamin D deficiency 07/10/2019  . Vitamin B12 deficiency 07/10/2019  . Hypothyroidism   . Parkinson's disease ([place])   .  Frequent falls   . Osteoporosis     , W. 09/19/2019, 5:32 PM  Frazier Butt., PT   [facility] [address] [place], [place], [address] Phone: [phone]   Fax:  [phone]  Name: Cait Locust MRN: [id number] Date of Birth: 1951-03-04   PHYSICAL THERAPY DISCHARGE SUMMARY  Visits from Start of Care:  15  Current functional level related to goals / functional outcomes: PT Long Term Goals - 09/19/19 1725      PT LONG TERM GOAL #1   Title  Pt will be independent with HEP for Parkinson's specific exercises, to address posture, transfers, balance, gait.  TARGET  10/04/2019(pt will return wk of 09/11/2019 due to being out of town)    Baseline  HEP has been initiated and reviewed; unsure that pt is performing consistently at home; could perform and add PWR! Moves    Time  4    Period  Weeks    Status  Not Met      PT LONG TERM GOAL #2   Title  Pt will verbalize understanding of means of obtaining U-step RW for use at home and in community.    Time  4    Period  Weeks    Status  Achieved      PT LONG TERM GOAL #3   Title  Pt will perform gait using U-step RW around obstacles, through doorways, and tight turning spaces in gym area, with supervision, simulating home situations.    Time  4    Period  Weeks    Status  Achieved      PT LONG TERM GOAL #4   Title  Pt will verbalize plans for ongoing community fitness upon d/c from PT.    Time  4    Period  Weeks    Status  Achieved      PT LONG TERM GOAL #5   Title  Pt will ambulate at least 1000 ft, indoors and outdoor surfaces, using U-step RW,with supervision for improved safety with community gait.    Time  4    Period  Weeks    Status  Achieved      Pt hs met 4 of 5 current LTGs.     Remaining deficits: Decreased safety awareness, abnormal posture, decreased balance, abnormal gait   Education / Equipment: Educated in fall prevention, HEP, benefits of and indications for U-step RW for improved safety with gait (pt has information and husband states they will order.)  Plan: Patient agrees to discharge.  Patient goals were partially met. Patient is being discharged due to meeting the stated rehab goals.  ?????   Pt has upcoming travels and additional focus on family matters; consistency of therapy appointments has been limited.   Plan to d/c for now, and recommend followup PT screen in 6-9 months.  Mady Haagensen, PT 09/19/19 5:36 PM Phone: [phone] Fax: [phone]

## 2019-09-19 NOTE — Therapy (Signed)
[facility] [address] [place], [place], [address] Phone: [phone]   Fax:  [phone]  Occupational Therapy Treatment  Patient Details  Name: Kelly Howard MRN: [id number] Date of Birth: [birth year] Referring Provider (OT): Dr.Tat, Dr. Linus Mako   Encounter Date: 09/19/2019  OT End of Session - 09/19/19 1120    Visit Number  4    Number of Visits  17    Date for OT Re-Evaluation  10/07/19    Authorization Type  [employer]    Authorization - Visit Number  4    Authorization - Number of Visits  10    Progress Note Due on Visit  10    OT Start Time  1106    OT Stop Time  1144    OT Time Calculation (min)  38 min    Activity Tolerance  Patient tolerated treatment well    Behavior During Therapy  Impulsive;Restless       Past Medical History:  Diagnosis Date  . Frequent falls   . Hypothyroidism   . Osteoporosis   . Parkinson's disease [facility])     Past Surgical History:  Procedure Laterality Date  . TONSILLECTOMY    . WRIST SURGERY      There were no vitals filed for this visit.  Subjective Assessment - 09/19/19 1107    Subjective   Pt reports wrist pain in the mornings    Limitations  no prec. and full weightbearing    Currently in Pain?  Yes    Pain Score  2     Pain Location  Wrist    Pain Orientation  Right;Left    Pain Descriptors / Indicators  Aching    Pain Type  Chronic pain    Pain Onset  More than a month ago    Pain Frequency  Intermittent    Aggravating Factors   cold temps    Pain Relieving Factors  not moving               Treatment: Fluidotherapy x 10 mins to bilateral hands/ wrists due to stiffness and pain, no adverse reactions, with pt performing A/ROM wrist and finger movements while in fluido.            OT Education - 09/19/19 1130    Education Details  PWR! moves seated 10 reps each, min v.c and demo performed, handwriting strategies and handwriting  practice with foam grip, min v.c    Person(s) Educated  Patient    Methods  Explanation;Handout;Demonstration;Verbal cues    Comprehension  Verbalized understanding;Returned demonstration;Verbal cues required       OT Short Term Goals - 09/19/19 1129      OT SHORT TERM GOAL #1   Title  I with HEP.    Time  4    Period  Weeks    Status  On-going    Target Date  09/07/19      OT SHORT TERM GOAL #2   Title  I with adaptes strategies to maximize safety and I with ADLs/ IADLs.    Time  4    Period  Weeks    Status  On-going      OT SHORT TERM GOAL #3   Title  Pt will demonstrate improved LUE functional use as evidenced by increasing box/ blocks score by 4 blocks.    Baseline  RUE 48, LUE39    Time  4    Period  Weeks  Status  On-going      OT SHORT TERM GOAL #4   Title  Pt will write a short paragraph with 100% legibility and no significant decrease in letter size.    Time  4    Period  Weeks    Status  On-going        OT Long Term Goals - 09/17/19 1113      OT LONG TERM GOAL #1   Title  Pt will verbalize understanding of ways to prevent future PD complications.    Time  8    Period  Weeks    Status  New      OT LONG TERM GOAL #2   Title  Pt will demonstrate ability to retrieve a lightweight object from overhead shelf with -15 elbow extension with RUE.    Time  8    Period  Weeks    Status  New      OT LONG TERM GOAL #3   Title  Pt will demonstrate ability to retrieve a lightweight object from overhead shelf with -20 elbow extension LUE    Time  8    Period  Weeks    Status  New      OT LONG TERM GOAL #4   Title  Pt will demonstrate wrist extension of at least 50 for bilateral wrists for improved functional use.    Baseline  RUE 45, LUE 15    Time  8    Period  Weeks    Status  Achieved   09/17/19:  R 65*, L 60*     OT LONG TERM GOAL #5   Title  Pt will demonstrate improved ease with dressing as eveidenced by decreasing 3 button/ unbutton time to 47 secs  or less.    Baseline  51.87    Time  8    Period  Weeks    Status  New            Plan - 09/19/19 1121    Clinical Impression Statement  Pt is progressing towards goals. She reports wrist pain feels better after fluidotherapy.    OT Occupational Profile and History  Problem Focused Assessment - Including review of records relating to presenting problem    Occupational performance deficits (Please refer to evaluation for details):  ADL's;IADL's;Leisure;Social Participation    Body Structure / Function / Physical Skills  ADL;Balance;Mobility;Strength;Flexibility;FMC;Coordination;Decreased knowledge of precautions;GMC;ROM;Decreased knowledge of use of DME;Dexterity;IADL    Cognitive Skills  Attention;Emotional;Memory;Problem Solve;Sequencing    Rehab Potential  Good    Clinical Decision Making  Limited treatment options, no task modification necessary    Comorbidities Affecting Occupational Performance:  May have comorbidities impacting occupational performance    Modification or Assistance to Complete Evaluation   No modification of tasks or assist necessary to complete eval    OT Frequency  2x / week    OT Duration  8 weeks   plus eval   OT Treatment/Interventions  Self-care/ADL training;Aquatic Therapy;Building services engineer;Therapeutic exercise;Balance training;Splinting;Manual Therapy;Neuromuscular education;Ultrasound;Therapeutic activities;Energy conservation;Paraffin;DME and/or AE instruction;Cognitive remediation/compensation;Fluidtherapy;Gait Training;Moist Heat;Contrast Bath;Passive range of motion;Patient/family education    Plan  strategies for ADLs, initiate HEP/PWR! moves, functional reaching    Consulted and Agree with Plan of Care  Patient;Family member/caregiver    Family Member Consulted  husband present at beginning of session       Patient will benefit from skilled therapeutic intervention in order to improve the following deficits and impairments:   Body  Structure /  Function / Physical Skills: ADL, Balance, Mobility, Strength, Flexibility, FMC, Coordination, Decreased knowledge of precautions, GMC, ROM, Decreased knowledge of use of DME, Dexterity, IADL Cognitive Skills: Attention, Emotional, Memory, Problem Solve, Sequencing     Visit Diagnosis: Other lack of coordination  Other symptoms and signs involving the nervous system  Muscle weakness (generalized)    Problem List Patient Active Problem List   Diagnosis Date Noted  . Vitamin D deficiency 07/10/2019  . Vitamin B12 deficiency 07/10/2019  . Hypothyroidism   . Parkinson's disease ([facility])   . Frequent falls   . Osteoporosis     Kelly Howard 09/19/2019, 1:05 PM  [facility] [address] [place], [place], [address] Phone: [phone]   Fax:  [phone]  Name: Kelly Howard MRN: [id number] Date of Birth: Sep 06, 1950

## 2019-09-19 NOTE — Patient Instructions (Addendum)
It is important to avoid accidents which may result in broken bones.  Here are a few ideas on how to make your home safer so you will be less likely to trip or fall.  1. Use nonskid mats or non slip strips in your shower or tub, on your bathroom floor and around sinks.  If you know that you have spilled water, wipe it up! 2. In the bathroom, it is important to have properly installed grab bars on the walls or on the edge of the tub.  Towel racks are NOT strong enough for you to hold onto or to pull on for support. 3. Stairs and hallways should have enough light.  Add lamps or night lights if you need more light. 4. It is good to have handrails on both sides of the stairs if possible.  Always fix broken handrails right away. 5. It is important to see the edges of steps.  Paint the edges of outdoor steps white so you can see them better.  Put colored tape on the edge of inside steps. 6. Throw-rugs are dangerous because they can slide.  Removing the rugs is the best idea, but if they must stay, add adhesive carpet tape to prevent slipping. 7. Do not keep things on stairs or in the halls.  Remove small furniture that blocks the halls as it may cause you to trip.  Keep telephone and electrical cords out of the way where you walk. 8. Always were sturdy, rubber-soled shoes for good support.  Never wear just socks, especially on the stairs.  Socks may cause you to slip or fall.  Do not wear full-length housecoats as you can easily trip on the bottom.  9. Place the things you use the most on the shelves that are the easiest to reach.  If you use a stepstool, make sure it is in good condition.  If you feel unsteady, DO NOT climb, ask for help. 10. If a health professional advises you to use a cane or walker, do not be ashamed.  These items can keep you from falling and breaking your bones. 

## 2019-09-23 DIAGNOSIS — F4323 Adjustment disorder with mixed anxiety and depressed mood: Secondary | ICD-10-CM | POA: Diagnosis not present

## 2019-09-24 ENCOUNTER — Ambulatory Visit: Payer: Medicare PPO | Admitting: Occupational Therapy

## 2019-09-24 ENCOUNTER — Ambulatory Visit: Payer: Medicare PPO | Admitting: Physical Therapy

## 2019-09-26 ENCOUNTER — Encounter: Payer: Medicare PPO | Admitting: Occupational Therapy

## 2019-09-26 ENCOUNTER — Ambulatory Visit: Payer: Medicare PPO | Admitting: Physical Therapy

## 2019-09-27 ENCOUNTER — Encounter: Payer: Medicare PPO | Admitting: Occupational Therapy

## 2019-09-27 ENCOUNTER — Ambulatory Visit: Payer: Medicare PPO | Admitting: Physical Therapy

## 2019-09-29 ENCOUNTER — Other Ambulatory Visit: Payer: Self-pay | Admitting: Neurology

## 2019-10-01 ENCOUNTER — Ambulatory Visit: Payer: Medicare PPO | Admitting: Occupational Therapy

## 2019-10-02 ENCOUNTER — Ambulatory Visit: Payer: Medicare PPO | Admitting: Adult Health

## 2019-10-02 DIAGNOSIS — F4322 Adjustment disorder with anxiety: Secondary | ICD-10-CM | POA: Diagnosis not present

## 2019-10-03 ENCOUNTER — Ambulatory Visit: Payer: Medicare PPO | Admitting: Occupational Therapy

## 2019-10-04 ENCOUNTER — Telehealth: Payer: Self-pay | Admitting: Internal Medicine

## 2019-10-04 ENCOUNTER — Other Ambulatory Visit: Payer: Self-pay

## 2019-10-04 DIAGNOSIS — Z1231 Encounter for screening mammogram for malignant neoplasm of breast: Secondary | ICD-10-CM | POA: Diagnosis not present

## 2019-10-04 NOTE — Telephone Encounter (Signed)
Left message on machine for patient to return our call:  1.  We will need to see her jury summons to write a letter 2.  We will be glad to fill out a handicap placard 3. Please continue [facility] for bone health until your next office visit and Dr Ardyth Harps will discuss meds then 4.  Lab appointment needed for Vitamin D.  Order already placed.

## 2019-10-04 NOTE — Telephone Encounter (Signed)
Pt received a jury summons and would like for her PCP to write a note stating due to her medical diagnoses, Parkinson's, she is unable to attend.  She also wanted to let her PCP know that Dr. Arbutus Leas recommended her to see a psychiatrist at [facility] due to an Panic attack. She also needs to renew her handi-capp sticker so she will be bringing by a form soon for her to fill out.   Pt states she is taken the ibandronate once last year and hasn't since but she is concern she is taking too many meds at once -She is wondering how to navigate her medication?   Pt states she has finished her medication for vitamin d and wondering what to do from here?   Pt can be reached at [phone]

## 2019-10-07 ENCOUNTER — Other Ambulatory Visit: Payer: Self-pay

## 2019-10-08 ENCOUNTER — Ambulatory Visit: Payer: Medicare PPO | Attending: Internal Medicine | Admitting: Occupational Therapy

## 2019-10-08 ENCOUNTER — Other Ambulatory Visit (INDEPENDENT_AMBULATORY_CARE_PROVIDER_SITE_OTHER): Payer: Medicare PPO

## 2019-10-08 ENCOUNTER — Ambulatory Visit (INDEPENDENT_AMBULATORY_CARE_PROVIDER_SITE_OTHER): Payer: Medicare PPO | Admitting: *Deleted

## 2019-10-08 ENCOUNTER — Telehealth: Payer: Self-pay | Admitting: *Deleted

## 2019-10-08 ENCOUNTER — Telehealth: Payer: Self-pay | Admitting: Internal Medicine

## 2019-10-08 ENCOUNTER — Encounter: Payer: Self-pay | Admitting: Occupational Therapy

## 2019-10-08 VITALS — BP 125/72

## 2019-10-08 DIAGNOSIS — M6281 Muscle weakness (generalized): Secondary | ICD-10-CM | POA: Insufficient documentation

## 2019-10-08 DIAGNOSIS — E538 Deficiency of other specified B group vitamins: Secondary | ICD-10-CM | POA: Diagnosis not present

## 2019-10-08 DIAGNOSIS — E559 Vitamin D deficiency, unspecified: Secondary | ICD-10-CM | POA: Diagnosis not present

## 2019-10-08 DIAGNOSIS — R278 Other lack of coordination: Secondary | ICD-10-CM | POA: Insufficient documentation

## 2019-10-08 DIAGNOSIS — R29818 Other symptoms and signs involving the nervous system: Secondary | ICD-10-CM

## 2019-10-08 DIAGNOSIS — M25631 Stiffness of right wrist, not elsewhere classified: Secondary | ICD-10-CM | POA: Insufficient documentation

## 2019-10-08 DIAGNOSIS — R41840 Attention and concentration deficit: Secondary | ICD-10-CM | POA: Diagnosis not present

## 2019-10-08 DIAGNOSIS — M25632 Stiffness of left wrist, not elsewhere classified: Secondary | ICD-10-CM | POA: Diagnosis not present

## 2019-10-08 LAB — VITAMIN D 25 HYDROXY (VIT D DEFICIENCY, FRACTURES): VITD: 61.17 ng/mL (ref 30.00–100.00)

## 2019-10-08 MED ORDER — CYANOCOBALAMIN 1000 MCG/ML IJ SOLN
1000.0000 ug | Freq: Once | INTRAMUSCULAR | Status: AC
  Administered 2019-10-08: 1000 ug via INTRAMUSCULAR

## 2019-10-08 NOTE — Telephone Encounter (Signed)
Per Laney Potash, the pt informed him she was out of medication.  I brought the pt back to a room for more information and the pt stated she is not sure which medication she needed a refill on as there are 2 and she was not sure which one was a shot.  After reviewing the lab results, per Dr Hardie Shackleton note the pt needs a B12 injection once a month at this point and needed labs for the vitamin D level.  I reviewed the results with the pt and explained the difference between the 2 medications and advised the pt to await the results from the vitamin D level to determine if the prescription form or OTC is indicated by PCP.  Vitamin B12 injection given today and the pt was given a copy of the results and these were reviewed once more.

## 2019-10-08 NOTE — Telephone Encounter (Signed)
Spoke with patient and she will drop of the letter needed.

## 2019-10-08 NOTE — Telephone Encounter (Signed)
Pt came in dropped off Jury Summons form to be completed by the provider. Upon completion pt would like to have have it mail to: [employer], [address], [place], [place] [address]. Placed in providers folder.

## 2019-10-08 NOTE — Therapy (Signed)
[facility] [address] [place], [place], [address] Phone: [phone]   Fax:  [phone]  Occupational Therapy Treatment  Patient Details  Name: Kelly Howard MRN: [id number] Date of Birth: [birth year] Referring Provider (OT): Dr.Tat, Dr. Rubin Payor   Encounter Date: 10/08/2019  OT End of Session - 10/08/19 1434    Visit Number  5    Number of Visits  17    Date for OT Re-Evaluation  10/07/19    Authorization Type  [employer]    Authorization - Visit Number  5    Authorization - Number of Visits  10    Progress Note Due on Visit  10    OT Start Time  1320    OT Stop Time  1400    OT Time Calculation (min)  40 min    Activity Tolerance  Patient tolerated treatment well    Behavior During Therapy  Impulsive;Restless       Past Medical History:  Diagnosis Date  . Frequent falls   . Hypothyroidism   . Osteoporosis   . Parkinson's disease [facility])     Past Surgical History:  Procedure Laterality Date  . TONSILLECTOMY    . WRIST SURGERY      Vitals:   10/08/19 1340  BP: 125/72    Subjective Assessment - 10/08/19 1324    Subjective   Denies pain    Limitations  no prec. and full weightbearing    Currently in Pain?  No/denies                Treatment: Safety recommendations provided, therapist recommends pt pursues assistance with house cleaning and that she takes items to cleaners rather than ironing,  discussed safety donning pants in seated and recommendation that pt sits in shower when shaving, she has a seat. Handwriting strategies, min v.c          OT Education - 10/08/19 1339    Education Details  PWR! moves seated 10 reps each, min v.c and demo performed, ADL strategies and handwriting strategies, min v.c    Person(s) Educated  Patient    Methods  Explanation;Demonstration;Verbal cues    Comprehension  Verbalized understanding;Returned demonstration;Verbal cues required        OT Short Term Goals - 10/08/19 1326      OT SHORT TERM GOAL #1   Title  I with HEP.    Time  4    Period  Weeks    Status  On-going    Target Date  09/07/19      OT SHORT TERM GOAL #2   Title  I with adaptes strategies to maximize safety and I with ADLs/ IADLs.    Time  4    Period  Weeks    Status  On-going      OT SHORT TERM GOAL #3   Title  Pt will demonstrate improved LUE functional use as evidenced by increasing box/ blocks score by 4 blocks.    Baseline  RUE 48, LUE39    Time  4    Period  Weeks    Status  On-going      OT SHORT TERM GOAL #4   Title  Pt will write a short paragraph with 100% legibility and no significant decrease in letter size.    Time  4    Period  Weeks    Status  On-going        OT Long Term Goals - 10/08/19  [place] #1   Title  Pt will verbalize understanding of ways to prevent future PD complications.    Time  8    Period  Weeks    Status  On-going      OT LONG TERM GOAL #2   Title  Pt will demonstrate ability to retrieve a lightweight object from overhead shelf with -15 elbow extension with RUE.    Time  8    Period  Weeks    Status  On-going      OT LONG TERM GOAL #3   Title  Pt will demonstrate ability to retrieve a lightweight object from overhead shelf with -20 elbow extension LUE    Time  8    Period  Weeks    Status  On-going      OT LONG TERM GOAL #4   Title  Pt will demonstrate wrist extension of at least 50 for bilateral wrists for improved functional use.    Baseline  RUE 45, LUE 15    Time  8    Period  Weeks    Status  Achieved   09/17/19:  R 65*, L 60*     OT LONG TERM GOAL #5   Title  Pt will demonstrate improved ease with dressing as eveidenced by decreasing 3 button/ unbutton time to 47 secs or less.    Baseline  51.87    Time  8    Period  Weeks    Status  On-going            Plan - 10/08/19 1435    Clinical Impression Statement  Pt is progressing towards goals. She can  benefit from continued education regarding safety for ADLs/IADLs.    OT Occupational Profile and History  Problem Focused Assessment - Including review of records relating to presenting problem    Occupational performance deficits (Please refer to evaluation for details):  ADL's;IADL's;Leisure;Social Participation    Body Structure / Function / Physical Skills  ADL;Balance;Mobility;Strength;Flexibility;FMC;Coordination;Decreased knowledge of precautions;GMC;ROM;Decreased knowledge of use of DME;Dexterity;IADL    Cognitive Skills  Attention;Emotional;Memory;Problem Solve;Sequencing    Rehab Potential  Good    Clinical Decision Making  Limited treatment options, no task modification necessary    Comorbidities Affecting Occupational Performance:  May have comorbidities impacting occupational performance    Modification or Assistance to Complete Evaluation   No modification of tasks or assist necessary to complete eval    OT Frequency  2x / week    OT Duration  8 weeks   plus eval   OT Treatment/Interventions  Self-care/ADL training;Aquatic Therapy;Therapist, nutritional;Therapeutic exercise;Balance training;Splinting;Manual Therapy;Neuromuscular education;Ultrasound;Therapeutic activities;Energy conservation;Paraffin;DME and/or AE instruction;Cognitive remediation/compensation;Fluidtherapy;Gait Training;Moist Heat;Contrast Bath;Passive range of motion;Patient/family education    Plan  strategies for ADLs, , functional reaching    Consulted and Agree with Plan of Care  Patient;Family member/caregiver    Family Member Consulted  husband present at beginning of session       Patient will benefit from skilled therapeutic intervention in order to improve the following deficits and impairments:   Body Structure / Function / Physical Skills: ADL, Balance, Mobility, Strength, Flexibility, FMC, Coordination, Decreased knowledge of precautions, GMC, ROM, Decreased knowledge of use of DME, Dexterity,  IADL Cognitive Skills: Attention, Emotional, Memory, Problem Solve, Sequencing     Visit Diagnosis: Other lack of coordination  Other symptoms and signs involving the nervous system  Muscle weakness (generalized)  Attention and concentration deficit  Problem List Patient Active Problem List   Diagnosis Date Noted  . Vitamin D deficiency 07/10/2019  . Vitamin B12 deficiency 07/10/2019  . Hypothyroidism   . Parkinson's disease (HCC)   . Frequent falls   . Osteoporosis     Zyan Mirkin 10/08/2019, 2:41 PM  [facility] [address] [place], [place], [address] Phone: [phone]   Fax:  [phone]  Name: Tanairi Cypert MRN: [id number] Date of Birth: 1950-06-13

## 2019-10-08 NOTE — Telephone Encounter (Signed)
Patient came in for nurse visit  BP 110/70

## 2019-10-08 NOTE — Progress Notes (Signed)
Per orders of Dr. Hernandez, injection of Cyanocobalamin 1000mcg given by Carolos Fecher A. Patient tolerated injection well. 

## 2019-10-09 ENCOUNTER — Encounter: Payer: Self-pay | Admitting: Internal Medicine

## 2019-10-09 NOTE — Telephone Encounter (Signed)
completed and ready for patient to pick up.

## 2019-10-09 NOTE — Telephone Encounter (Signed)
Form and letter placed on Dr Hardie Shackleton folder

## 2019-10-09 NOTE — Telephone Encounter (Signed)
Form and letter completed.  Patient is aware.

## 2019-10-10 ENCOUNTER — Ambulatory Visit: Payer: Medicare PPO | Admitting: Occupational Therapy

## 2019-10-10 ENCOUNTER — Other Ambulatory Visit: Payer: Self-pay

## 2019-10-10 ENCOUNTER — Encounter: Payer: Self-pay | Admitting: Occupational Therapy

## 2019-10-10 DIAGNOSIS — R278 Other lack of coordination: Secondary | ICD-10-CM

## 2019-10-10 DIAGNOSIS — R29818 Other symptoms and signs involving the nervous system: Secondary | ICD-10-CM

## 2019-10-10 DIAGNOSIS — R41840 Attention and concentration deficit: Secondary | ICD-10-CM | POA: Diagnosis not present

## 2019-10-10 DIAGNOSIS — M6281 Muscle weakness (generalized): Secondary | ICD-10-CM

## 2019-10-10 DIAGNOSIS — M25631 Stiffness of right wrist, not elsewhere classified: Secondary | ICD-10-CM | POA: Diagnosis not present

## 2019-10-10 DIAGNOSIS — M25632 Stiffness of left wrist, not elsewhere classified: Secondary | ICD-10-CM | POA: Diagnosis not present

## 2019-10-10 NOTE — Therapy (Signed)
[facility] [address] [place], [place], [address] Phone: [phone]   Fax:  [phone]  Occupational Therapy Treatment  Patient Details  Name: Kelly Howard MRN: [id number] Date of Birth: Sep 04, 1950 Referring Provider (OT): Dr.Tat, Dr. Rubin Payor   Encounter Date: 10/10/2019  OT End of Session - 10/10/19 1639    Visit Number  6    Number of Visits  17    Date for OT Re-Evaluation  10/07/19    Authorization Type  [employer]    Authorization - Visit Number  6    Authorization - Number of Visits  10    Progress Note Due on Visit  10    OT Start Time  1403    OT Stop Time  1443    OT Time Calculation (min)  40 min    Activity Tolerance  Patient tolerated treatment well    Behavior During Therapy  Impulsive;Restless       Past Medical History:  Diagnosis Date  . Frequent falls   . Hypothyroidism   . Osteoporosis   . Parkinson's disease [facility])     Past Surgical History:  Procedure Laterality Date  . TONSILLECTOMY    . WRIST SURGERY      There were no vitals filed for this visit.  Subjective Assessment - 10/10/19 1410    Subjective   mild wrist pain    Limitations  no prec. and full weightbearing    Currently in Pain?  Yes    Pain Score  2     Pain Location  Wrist    Pain Orientation  Right;Left    Pain Descriptors / Indicators  Aching    Pain Type  Chronic pain    Pain Onset  More than a month ago    Pain Frequency  Intermittent    Aggravating Factors   use of UE's    Pain Relieving Factors  rest                Treatment: discussed ST plans to discharge pt due to achieving maximum benefit from therapy, and that pt may benefit from additional therapy in 6 mons, when she is able to focus better.. Pt was made aware that speech therapist would be happy to discuss with her if she has questions. Pt reports she has a lump in her throat at times, but it goes away if she does something else like  ride her stationary bike. It appears related to anxiety as pt denies swallowing difficulties.Pt was instructed to call MD with choking or swallowing difficulties. Discussed exercise options, therapist recommends continued use of pt's stationary bike at home and PWR! Moves exercises in seated. Handwriting strategies, min v.,c for letter size/ legibility. Fastening buttons with adapted strategy, mod v.c / mod difficulty. Arm bike x 5 mins level 2 for conditioning.             OT Short Term Goals - 10/10/19 1411      OT SHORT TERM GOAL #1   Title  I with HEP.    Time  4    Period  Weeks    Status  Achieved    Target Date  09/07/19      OT SHORT TERM GOAL #2   Title  I with adaptes strategies to maximize safety and I with ADLs/ IADLs.    Time  4    Period  Weeks    Status  On-going  OT SHORT TERM GOAL #3   Title  Pt will demonstrate improved LUE functional use as evidenced by increasing box/ blocks score by 4 blocks.    Baseline  RUE 48, LUE39    Time  4    Period  Weeks    Status  On-going      OT SHORT TERM GOAL #4   Title  Pt will write a short paragraph with 100% legibility and no significant decrease in letter size.    Time  4    Period  Weeks    Status  On-going        OT Long Term Goals - 10/08/19 1328      OT LONG TERM GOAL #1   Title  Pt will verbalize understanding of ways to prevent future PD complications.    Time  8    Period  Weeks    Status  On-going      OT LONG TERM GOAL #2   Title  Pt will demonstrate ability to retrieve a lightweight object from overhead shelf with -15 elbow extension with RUE.    Time  8    Period  Weeks    Status  On-going      OT LONG TERM GOAL #3   Title  Pt will demonstrate ability to retrieve a lightweight object from overhead shelf with -20 elbow extension LUE    Time  8    Period  Weeks    Status  On-going      OT LONG TERM GOAL #4   Title  Pt will demonstrate wrist extension of at least 50 for bilateral  wrists for improved functional use.    Baseline  RUE 45, LUE 15    Time  8    Period  Weeks    Status  Achieved   09/17/19:  R 65*, L 60*     OT LONG TERM GOAL #5   Title  Pt will demonstrate improved ease with dressing as eveidenced by decreasing 3 button/ unbutton time to 47 secs or less.    Baseline  51.87    Time  8    Period  Weeks    Status  On-going            Plan - 10/10/19 1633    Clinical Impression Statement  Pt is progressing slowly towards goals. Progress is limited by pt anxiety.    OT Occupational Profile and History  Problem Focused Assessment - Including review of records relating to presenting problem    Occupational performance deficits (Please refer to evaluation for details):  ADL's;IADL's;Leisure;Social Participation    Body Structure / Function / Physical Skills  ADL;Balance;Mobility;Strength;Flexibility;FMC;Coordination;Decreased knowledge of precautions;GMC;ROM;Decreased knowledge of use of DME;Dexterity;IADL    Cognitive Skills  Attention;Emotional;Memory;Problem Solve;Sequencing    Rehab Potential  Good    Clinical Decision Making  Limited treatment options, no task modification necessary    Comorbidities Affecting Occupational Performance:  May have comorbidities impacting occupational performance    Modification or Assistance to Complete Evaluation   No modification of tasks or assist necessary to complete eval    OT Frequency  2x / week    OT Duration  8 weeks   plus eval   OT Treatment/Interventions  Self-care/ADL training;Aquatic Therapy;Building services engineer;Therapeutic exercise;Balance training;Splinting;Manual Therapy;Neuromuscular education;Ultrasound;Therapeutic activities;Energy conservation;Paraffin;DME and/or AE instruction;Cognitive remediation/compensation;Fluidtherapy;Gait Training;Moist Heat;Contrast Bath;Passive range of motion;Patient/family education    Plan  strategies for ADLs, , functional reaching    Consulted and Agree  with  Plan of Care  Patient       Patient will benefit from skilled therapeutic intervention in order to improve the following deficits and impairments:   Body Structure / Function / Physical Skills: ADL, Balance, Mobility, Strength, Flexibility, FMC, Coordination, Decreased knowledge of precautions, GMC, ROM, Decreased knowledge of use of DME, Dexterity, IADL Cognitive Skills: Attention, Emotional, Memory, Problem Solve, Sequencing     Visit Diagnosis: Other lack of coordination  Other symptoms and signs involving the nervous system  Muscle weakness (generalized)  Attention and concentration deficit    Problem List Patient Active Problem List   Diagnosis Date Noted  . Vitamin D deficiency 07/10/2019  . Vitamin B12 deficiency 07/10/2019  . Hypothyroidism   . Parkinson's disease ([place])   . Frequent falls   . Osteoporosis     Yuktha Kerchner 10/10/2019, 4:40 PM  [facility] [address] [place], [place], [address] Phone: [phone]   Fax:  [phone]  Name: Kanyah Matsushima MRN: [id number] Date of Birth: 09-27-1950

## 2019-10-14 ENCOUNTER — Other Ambulatory Visit: Payer: Self-pay

## 2019-10-14 ENCOUNTER — Encounter: Payer: Self-pay | Admitting: Adult Health

## 2019-10-14 ENCOUNTER — Telehealth: Payer: Self-pay | Admitting: Internal Medicine

## 2019-10-14 ENCOUNTER — Ambulatory Visit (INDEPENDENT_AMBULATORY_CARE_PROVIDER_SITE_OTHER): Payer: Medicare PPO | Admitting: Adult Health

## 2019-10-14 VITALS — BP 119/73 | HR 75 | Ht 67.0 in | Wt 130.0 lb

## 2019-10-14 DIAGNOSIS — G4700 Insomnia, unspecified: Secondary | ICD-10-CM

## 2019-10-14 DIAGNOSIS — F410 Panic disorder [episodic paroxysmal anxiety] without agoraphobia: Secondary | ICD-10-CM | POA: Diagnosis not present

## 2019-10-14 DIAGNOSIS — F411 Generalized anxiety disorder: Secondary | ICD-10-CM

## 2019-10-14 DIAGNOSIS — F331 Major depressive disorder, recurrent, moderate: Secondary | ICD-10-CM | POA: Diagnosis not present

## 2019-10-14 DIAGNOSIS — E559 Vitamin D deficiency, unspecified: Secondary | ICD-10-CM

## 2019-10-14 MED ORDER — CLONAZEPAM 0.5 MG PO TABS: 0.5000 mg | ORAL_TABLET | Freq: Every day | ORAL | 2 refills | Status: DC

## 2019-10-14 MED ORDER — SERTRALINE HCL 50 MG PO TABS: ORAL_TABLET | ORAL | 1 refills | Status: DC

## 2019-10-14 NOTE — Progress Notes (Addendum)
[facility] MD/PA/NP Initial Note  10/14/2019 5:18 PM Kelly Howard  MRN:  [id number]  Chief Complaint:   HPI:  Describes mood today as "so-so". Tearful at times. Mood symptoms - denies depression and irritability. More anxious overall. Having panic attacks over the past three months. Has been taking "a small piece of Klonopin when extra anxious". Reports increased worry and and rumination - mind "going" when she lays down. Diagnosed with Parkinson's in 2010. Stating "I can't seem to settle down". Feels "restless". Can't sit down. Has a "fixed" way. Stating "I know I'm going to die from this stuff. Not wanting to drive as much. Broke both wrists last year - fell. Concerned about taking medications. Has been caring for her daughter and her issues for the past 25 years. Currently taking Clonazepam 0.5mg  at bedtime and Zoloft 50mg  daily. Stable interest and motivation. Taking medications as prescribed.  Energy levels vary. Stating "I do better in the mornings". Active, exercising - using fan bike - 10 minutes. Walking some days.    Enjoys some usual interests and activities. Married. Lives with husband of 46 years. Has 2 daughters - 3 grandchildren. Spending time with family. Visiting with friends. Mother is a 5 years old.  Appetite adequate. Weight gain - 130 pounds - had lost down to 105 pounds.  Sleeps well most nights - falls asleep quickly. Averages 6 to 7 hours. Up and down during the night. Not a "good rest".  Denies daytime napping.  Focus and concentration difficulties at times. Used to enjoy reading, but no longer able. Completing tasks cooking -other tasks. Managing aspects of household.   Denies SI or HI. Denies AH or VH. Has 2 therapists - one for Parkinson's and one for daughter's issues.   Previous medication trials: Clonazepam, Zoloft, Paxil, Celexa  Visit Diagnosis:    ICD-10-CM   1. Major depressive disorder, recurrent episode, moderate (HCC)  F33.1 sertraline (ZOLOFT) 50  MG tablet  2. Generalized anxiety disorder  F41.1 clonazePAM (KLONOPIN) 0.5 MG tablet    sertraline (ZOLOFT) 50 MG tablet  3. Insomnia, unspecified type  G47.00 clonazePAM (KLONOPIN) 0.5 MG tablet  4. Panic attacks  F41.0 clonazePAM (KLONOPIN) 0.5 MG tablet    Past Psychiatric History: Denies psychiatric hospitalization.  Past Medical History:  Past Medical History:  Diagnosis Date  . Frequent falls   . Hypothyroidism   . Osteoporosis   . Parkinson's disease [facility])     Past Surgical History:  Procedure Laterality Date  . TONSILLECTOMY    . WRIST SURGERY      Family Psychiatric History: Daughter with 25 years of substance abuse.   Family History:  Family History  Problem Relation Age of Onset  . Hypercalcemia Mother   . Cancer Paternal Grandmother   . Diabetes Paternal Grandfather     Social History:  Social History   Socioeconomic History  . Marital status: Married    Spouse name: Not on file  . Number of children: Not on file  . Years of education: Not on file  . Highest education level: Not on file  Occupational History  . Not on file  Tobacco Use  . Smoking status: Never Smoker  . Smokeless tobacco: Never Used  Substance and Sexual Activity  . Alcohol use: Yes  . Drug use: Never  . Sexual activity: Not on file  Other Topics Concern  . Not on file  Social History Narrative  . Not on file   Social Determinants of Health   Financial  Resource Strain:   . Difficulty of Paying Living Expenses:   Food Insecurity:   . Worried About Charity fundraiser in the Last Year:   . Arboriculturist in the Last Year:   Transportation Needs:   . Film/video editor (Medical):   Marland Kitchen Lack of Transportation (Non-Medical):   Physical Activity:   . Days of Exercise per Week:   . Minutes of Exercise per Session:   Stress:   . Feeling of Stress :   Social Connections:   . Frequency of Communication with Friends and Family:   . Frequency of Social Gatherings with  Friends and Family:   . Attends Religious Services:   . Active Member of Clubs or Organizations:   . Attends Archivist Meetings:   Marland Kitchen Marital Status:     Allergies:  Allergies  Allergen Reactions  . Codeine Other (See Comments)    GI Upset  . Escitalopram Swelling    Foot swelling  . Propoxyphene Other (See Comments)    Hallucinations    Metabolic Disorder Labs: Lab Results  Component Value Date   HGBA1C 5.7 07/10/2019   No results found for: PROLACTIN Lab Results  Component Value Date   CHOL 198 07/10/2019   TRIG 68.0 07/10/2019   HDL 69.50 07/10/2019   CHOLHDL 3 07/10/2019   VLDL 13.6 07/10/2019   LDLCALC 115 (H) 07/10/2019   Lab Results  Component Value Date   TSH 2.14 07/10/2019    Therapeutic Level Labs: No results found for: LITHIUM No results found for: VALPROATE No components found for:  CBMZ  Current Medications: Current Outpatient Medications  Medication Sig Dispense Refill  . Acetaminophen (TYLENOL PO) Take 500 mg by mouth as needed.    . AMBULATORY NON FORMULARY MEDICATION U step walker  Dx: G20 1 Device 0  . carbidopa-levodopa (SINEMET CR) 50-200 MG tablet TAKE 1 TABLET BY MOUTH EVERYDAY AT BEDTIME 30 tablet 1  . carbidopa-levodopa (SINEMET IR) 25-100 MG tablet TAKE 1.5 TABLETS BY MOUTH 5 (FIVE) TIMES DAILY. 1.5 TABLET EVERY 3 HRS 450 tablet 1  . clonazePAM (KLONOPIN) 0.5 MG tablet Take 1 tablet (0.5 mg total) by mouth at bedtime. 30 tablet 2  . levothyroxine (SYNTHROID, LEVOTHROID) 50 MCG tablet Take by mouth.    . MELATONIN PO Take 3 mg by mouth daily.    . rasagiline (AZILECT) 1 MG TABS tablet TAKE 1 TABLET BY MOUTH EVERY DAY 90 tablet 1  . sertraline (ZOLOFT) 50 MG tablet TAKE ONE AND 1/2 TABLETS BY MOUTH EVERY DAY FOR 7 DAYS, THEN INCREASE TO TWO TABLETS DAILY. 180 tablet 1  . Vitamin D, Ergocalciferol, (DRISDOL) 1.25 MG (50000 UNIT) CAPS capsule TAKE 1 CAPSULE (50,000 UNITS TOTAL) BY MOUTH EVERY 7 (SEVEN) DAYS FOR 12 DOSES. 12  capsule 0   No current facility-administered medications for this visit.    Medication Side Effects: none  Orders placed this visit:  No orders of the defined types were placed in this encounter.   Psychiatric Specialty Exam:  Review of Systems  Blood pressure 119/73, pulse 75, height 5\' 7"  (1.702 m), weight 130 lb (59 kg).Body mass index is 20.36 kg/m.  General Appearance: Neat and Well Groomed  Eye Contact:  Good  Speech:  Clear and Coherent and Normal Rate  Volume:  Normal  Mood:  Anxious and Depressed  Affect:  Congruent  Thought Process:  Coherent and Descriptions of Associations: Intact  Orientation:  Full (Time, Place, and Person)  Thought  Content: Logical   Suicidal Thoughts:  No  Homicidal Thoughts:  No  Memory:  WNL  Judgement:  Good  Insight:  Good  Psychomotor Activity:  Normal  Concentration:  Concentration: Good  Recall:  Good  Fund of Knowledge: Good  Language: Good  Assets:  Communication Skills Desire for Improvement Financial Resources/Insurance Housing Intimacy Leisure Time Physical Health Resilience Social Support Talents/Skills Transportation Vocational/Educational  ADL's:  Intact  Cognition: WNL  Prognosis:  Good   Screenings:  PHQ2-9     Office Visit from 04/11/2019 in [facility] Total Score  0  PHQ-9 Total Score  3      Receiving Psychotherapy: Yes   Treatment Plan/Recommendations:   Plan:  PDMP reviewed  1. Increase Zoloft 50mg  to 100mg  daily 2. Continue Clonazepam 0.5mg  at hs   RTC 4 weeks  Patient advised to contact office with any questions, adverse effects, or acute worsening in signs and symptoms.  Discussed potential benefits, risk, and side effects of benzodiazepines to include potential risk of tolerance and dependence, as well as possible drowsiness.  Advised patient not to drive if experiencing drowsiness and to take lowest possible effective dose to minimize risk of dependence and  tolerance.   , NP

## 2019-10-14 NOTE — Telephone Encounter (Signed)
Vitamin D, Ergocalciferol, (DRISDOL) 1.25 MG (50000 UNIT) CAPS capsule  [id number] [place], [place] - [address] Phone:  [phone]  Fax:  [phone]       Patient is wanting to know if she still needs to take this Rx. If so she needs it sent to the pharmacy.  Please advise

## 2019-10-14 NOTE — Addendum Note (Signed)
Addended by: Dorothyann Gibbs on: 10/14/2019 05:18 PM   Modules accepted: Level of Service

## 2019-10-15 ENCOUNTER — Ambulatory Visit: Payer: Medicare PPO | Admitting: Occupational Therapy

## 2019-10-15 ENCOUNTER — Encounter: Payer: Self-pay | Admitting: Occupational Therapy

## 2019-10-15 DIAGNOSIS — R278 Other lack of coordination: Secondary | ICD-10-CM | POA: Diagnosis not present

## 2019-10-15 DIAGNOSIS — M25632 Stiffness of left wrist, not elsewhere classified: Secondary | ICD-10-CM | POA: Diagnosis not present

## 2019-10-15 DIAGNOSIS — M6281 Muscle weakness (generalized): Secondary | ICD-10-CM | POA: Diagnosis not present

## 2019-10-15 DIAGNOSIS — R41840 Attention and concentration deficit: Secondary | ICD-10-CM | POA: Diagnosis not present

## 2019-10-15 DIAGNOSIS — R29818 Other symptoms and signs involving the nervous system: Secondary | ICD-10-CM

## 2019-10-15 DIAGNOSIS — M25631 Stiffness of right wrist, not elsewhere classified: Secondary | ICD-10-CM | POA: Diagnosis not present

## 2019-10-15 NOTE — Progress Notes (Signed)
See nurse note

## 2019-10-15 NOTE — Therapy (Signed)
[facility] [address] [place], [place], [address] Phone: [phone]   Fax:  [phone]  Occupational Therapy Treatment  Patient Details  Name: Kelly Howard MRN: [id number] Date of Birth: 09/05/1950 Referring Provider (OT): Dr.Tat, Dr. Rubin Payor   Encounter Date: 10/15/2019  OT End of Session - 10/15/19 1414    Visit Number  7    Number of Visits  17    Date for OT Re-Evaluation  10/07/19    Authorization Type  [employer]    Authorization - Visit Number  7    Authorization - Number of Visits  10    Progress Note Due on Visit  10    OT Start Time  1409    OT Stop Time  1445    OT Time Calculation (min)  36 min    Activity Tolerance  Patient tolerated treatment well    Behavior During Therapy  Impulsive;Restless       Past Medical History:  Diagnosis Date  . Frequent falls   . Hypothyroidism   . Osteoporosis   . Parkinson's disease [facility])     Past Surgical History:  Procedure Laterality Date  . TONSILLECTOMY    . WRIST SURGERY      There were no vitals filed for this visit.        Fluidotherapy x 8 mins to bilateral wrists for pain and stiffness. Pt reports decreased  pain following use. Pt was instructed in HEP for hand and wrist strength, see pt instructions. Education regarding ways to prevent future PD complications performed                OT Education - 10/15/19 1427    Education Details  Ways to prevent future PD related complications, red theraputty HEP, wrist strength and ROM HEP.    Person(s) Educated  Patient    Methods  Explanation;Demonstration;Verbal cues;Handout    Comprehension  Verbalized understanding;Returned demonstration;Verbal cues required       OT Short Term Goals - 10/10/19 1411      OT SHORT TERM GOAL #1   Title  I with HEP.    Time  4    Period  Weeks    Status  Achieved    Target Date  09/07/19      OT SHORT TERM GOAL #2   Title  I with  adaptes strategies to maximize safety and I with ADLs/ IADLs.    Time  4    Period  Weeks    Status  On-going      OT SHORT TERM GOAL #3   Title  Pt will demonstrate improved LUE functional use as evidenced by increasing box/ blocks score by 4 blocks.    Baseline  RUE 48, LUE39    Time  4    Period  Weeks    Status  On-going      OT SHORT TERM GOAL #4   Title  Pt will write a short paragraph with 100% legibility and no significant decrease in letter size.    Time  4    Period  Weeks    Status  On-going        OT Long Term Goals - 10/08/19 1328      OT LONG TERM GOAL #1   Title  Pt will verbalize understanding of ways to prevent future PD complications.    Time  8    Period  Weeks    Status  On-going  OT LONG TERM GOAL #2   Title  Pt will demonstrate ability to retrieve a lightweight object from overhead shelf with -15 elbow extension with RUE.    Time  8    Period  Weeks    Status  On-going      OT LONG TERM GOAL #3   Title  Pt will demonstrate ability to retrieve a lightweight object from overhead shelf with -20 elbow extension LUE    Time  8    Period  Weeks    Status  On-going      OT LONG TERM GOAL #4   Title  Pt will demonstrate wrist extension of at least 50 for bilateral wrists for improved functional use.    Baseline  RUE 45, LUE 15    Time  8    Period  Weeks    Status  Achieved   09/17/19:  R 65*, L 60*     OT LONG TERM GOAL #5   Title  Pt will demonstrate improved ease with dressing as eveidenced by decreasing 3 button/ unbutton time to 47 secs or less.    Baseline  51.87    Time  8    Period  Weeks    Status  On-going            Plan - 10/15/19 1627    Clinical Impression Statement  Pt is progressing towards goals. Anticipate d/c next week.Therapist has encouraged pt to have her husband participate in next session.    OT Occupational Profile and History  Problem Focused Assessment - Including review of records relating to presenting  problem    Occupational performance deficits (Please refer to evaluation for details):  ADL's;IADL's;Leisure;Social Participation    Body Structure / Function / Physical Skills  ADL;Balance;Mobility;Strength;Flexibility;FMC;Coordination;Decreased knowledge of precautions;GMC;ROM;Decreased knowledge of use of DME;Dexterity;IADL    Cognitive Skills  Attention;Emotional;Memory;Problem Solve;Sequencing    Rehab Potential  Good    Clinical Decision Making  Limited treatment options, no task modification necessary    Comorbidities Affecting Occupational Performance:  May have comorbidities impacting occupational performance    Modification or Assistance to Complete Evaluation   No modification of tasks or assist necessary to complete eval    OT Frequency  2x / week    OT Duration  8 weeks   plus eval   OT Treatment/Interventions  Self-care/ADL training;Aquatic Therapy;Building services engineer;Therapeutic exercise;Balance training;Splinting;Manual Therapy;Neuromuscular education;Ultrasound;Therapeutic activities;Energy conservation;Paraffin;DME and/or AE instruction;Cognitive remediation/compensation;Fluidtherapy;Gait Training;Moist Heat;Contrast Bath;Passive range of motion;Patient/family education    Plan  strategies for ADLs, , functional reaching    Consulted and Agree with Plan of Care  Patient       Patient will benefit from skilled therapeutic intervention in order to improve the following deficits and impairments:   Body Structure / Function / Physical Skills: ADL, Balance, Mobility, Strength, Flexibility, FMC, Coordination, Decreased knowledge of precautions, GMC, ROM, Decreased knowledge of use of DME, Dexterity, IADL Cognitive Skills: Attention, Emotional, Memory, Problem Solve, Sequencing     Visit Diagnosis: Other symptoms and signs involving the nervous system  Other lack of coordination  Muscle weakness (generalized)  Attention and concentration deficit    Problem  List Patient Active Problem List   Diagnosis Date Noted  . Vitamin D deficiency 07/10/2019  . Vitamin B12 deficiency 07/10/2019  . Hypothyroidism   . Parkinson's disease (HCC)   . Frequent falls   . Osteoporosis     Hayla Hinger 10/15/2019, 4:32 PM  [facility] [address]  [place], [place], [address] Phone: [phone]   Fax:  [phone]  Name: Zondra Lawlor MRN: [id number] Date of Birth: 1951-04-11

## 2019-10-15 NOTE — Patient Instructions (Signed)
Ways to prevent future Parkinson's related complications: 1.   Exercise regularly,  2.   Focus on BIGGER movements during daily activities- really reach overhead, straighten elbows and extend fingers 3.   When dressing or reaching for your seatbelt make sure to use your body to assist by twisting while you reach-this can help to minimize stress on the shoulder and reduce the risk of a rotator cuff tear 4.   Swing your arms when you walk! People with PD are at increased risk for frozen shoulder and swinging your arms can reduce this risk.       1. Grip Strengthening (Resistive Putty)   Squeeze putty using thumb and all fingers. Repeat _20___ times. Do __2__ sessions per day.   2. Roll putty into tube on table and pinch between each finger and thumb x 10 reps each. (can do ring and small finger together)  .    AROM: Wrist Flexion    AROM: Forearm Pronation / Supination   Copyright  VHI. All rights reserved.     AROM: Wrist Extension   .  With _one___ palm down, bend wrist up. Repeat __10_ times per set.  Do __2_ sessions per day. hold a small 1 lbs water bottle         AROM: Forearm Pronation / Supination   With ____ arm in handshake position, slowly rotate palm down until stretch is felt. Relax. Then rotate palm up until stretch is felt. Repeat _15___ times per set. Do _4-6___ sessions per day.  Copyright  VHI. All rights reserved.

## 2019-10-15 NOTE — Addendum Note (Signed)
Addended by: Kern Reap B on: 10/15/2019 08:15 AM   Modules accepted: Orders

## 2019-10-16 DIAGNOSIS — H52203 Unspecified astigmatism, bilateral: Secondary | ICD-10-CM | POA: Diagnosis not present

## 2019-10-16 DIAGNOSIS — H2513 Age-related nuclear cataract, bilateral: Secondary | ICD-10-CM | POA: Diagnosis not present

## 2019-10-16 DIAGNOSIS — H401133 Primary open-angle glaucoma, bilateral, severe stage: Secondary | ICD-10-CM | POA: Diagnosis not present

## 2019-10-17 ENCOUNTER — Ambulatory Visit: Payer: Medicare PPO | Admitting: Occupational Therapy

## 2019-10-17 ENCOUNTER — Other Ambulatory Visit: Payer: Self-pay

## 2019-10-17 DIAGNOSIS — M25632 Stiffness of left wrist, not elsewhere classified: Secondary | ICD-10-CM | POA: Diagnosis not present

## 2019-10-17 DIAGNOSIS — R278 Other lack of coordination: Secondary | ICD-10-CM

## 2019-10-17 DIAGNOSIS — M6281 Muscle weakness (generalized): Secondary | ICD-10-CM | POA: Diagnosis not present

## 2019-10-17 DIAGNOSIS — M25631 Stiffness of right wrist, not elsewhere classified: Secondary | ICD-10-CM

## 2019-10-17 DIAGNOSIS — R29818 Other symptoms and signs involving the nervous system: Secondary | ICD-10-CM

## 2019-10-17 DIAGNOSIS — R41840 Attention and concentration deficit: Secondary | ICD-10-CM | POA: Diagnosis not present

## 2019-10-17 NOTE — Therapy (Signed)
[facility] [address] [place], [place], [address] Phone: [phone]   Fax:  [phone]  Occupational Therapy Treatment  Patient Details  Name: Kelly Howard MRN: [id number] Date of Birth: 1950/08/17 Referring Provider (OT): Dr.Tat, Dr. Rubin Payor   Encounter Date: 10/17/2019  OT End of Session - 10/17/19 1431    Visit Number  8    Number of Visits  17    Date for OT Re-Evaluation  10/07/19    Authorization Type  [employer]    Authorization - Visit Number  8    Authorization - Number of Visits  10    Progress Note Due on Visit  10    OT Start Time  1405    OT Stop Time  1445    OT Time Calculation (min)  40 min    Activity Tolerance  Patient tolerated treatment well    Behavior During Therapy  Impulsive;Restless       Past Medical History:  Diagnosis Date  . Frequent falls   . Hypothyroidism   . Osteoporosis   . Parkinson's disease [facility])     Past Surgical History:  Procedure Laterality Date  . TONSILLECTOMY    . WRIST SURGERY      There were no vitals filed for this visit.  Subjective Assessment - 10/17/19 1705    Subjective   mild wrist pain    Limitations  no prec. and full weightbearing    Currently in Pain?  Yes    Pain Score  3     Pain Location  Wrist    Pain Orientation  Right;Left    Pain Descriptors / Indicators  Aching    Pain Type  Chronic pain    Pain Onset  More than a month ago    Pain Frequency  Intermittent    Aggravating Factors   malpositioning or overuse    Pain Relieving Factors  heat                  Therapist  started checking progress towards goals. See goals for updates. Wrist flexion/ extension with a 1 lbs weight 10 reps each direction, followed by functional overhead reaching to place and remove graded clothespins for sustained pinch and functional reach, min v.c Paraffin to bilateral hands and wrists x 10 mins for pain and stiffness no adverse  reactions.            OT Short Term Goals - 10/17/19 1411      OT SHORT TERM GOAL #1   Title  I with HEP.    Time  4    Period  Weeks    Status  Achieved    Target Date  09/07/19      OT SHORT TERM GOAL #2   Title  I with adaptes strategies to maximize safety and I with ADLs/ IADLs.    Time  4    Period  Weeks    Status  On-going      OT SHORT TERM GOAL #3   Title  Pt will demonstrate improved LUE functional use as evidenced by increasing box/ blocks score by 4 blocks.    Baseline  RUE 48, LUE39    Time  4    Period  Weeks    Status  Achieved   RUE 50 blocks, LUE 46     OT SHORT TERM GOAL #4   Title  Pt will write a short paragraph with 100% legibility and  no significant decrease in letter size.    Time  4    Period  Weeks    Status  Achieved        OT Long Term Goals - 10/17/19 1419      OT LONG TERM GOAL #1   Title  Pt will verbalize understanding of ways to prevent future PD complications.    Time  8    Period  Weeks    Status  Achieved      OT LONG TERM GOAL #2   Title  Pt will demonstrate ability to retrieve a lightweight object from overhead shelf with -15 elbow extension with RUE.    Time  8    Period  Weeks    Status  On-going      OT LONG TERM GOAL #3   Title  Pt will demonstrate ability to retrieve a lightweight object from overhead shelf with -20 elbow extension LUE    Time  8    Period  Weeks    Status  On-going      OT LONG TERM GOAL #4   Title  Pt will demonstrate wrist extension of at least 50 for bilateral wrists for improved functional use.    Baseline  RUE 45, LUE 15    Time  8    Period  Weeks    Status  Achieved   09/17/19:  R 65*, L 60*     OT LONG TERM GOAL #5   Title  Pt will demonstrate improved ease with dressing as eveidenced by decreasing 3 button/ unbutton time to 47 secs or less.    Baseline  51.87    Time  8    Period  Weeks    Status  Achieved   28.25           Plan - 10/17/19 1703    Clinical  Impression Statement  Pt achieved several of her goals. See goals for updates anticipate d/c next visit.    OT Occupational Profile and History  Problem Focused Assessment - Including review of records relating to presenting problem    Occupational performance deficits (Please refer to evaluation for details):  ADL's;IADL's;Leisure;Social Participation    Body Structure / Function / Physical Skills  ADL;Balance;Mobility;Strength;Flexibility;FMC;Coordination;Decreased knowledge of precautions;GMC;ROM;Decreased knowledge of use of DME;Dexterity;IADL    Cognitive Skills  Attention;Emotional;Memory;Problem Solve;Sequencing    Rehab Potential  Good    Clinical Decision Making  Limited treatment options, no task modification necessary    Comorbidities Affecting Occupational Performance:  May have comorbidities impacting occupational performance    Modification or Assistance to Complete Evaluation   No modification of tasks or assist necessary to complete eval    OT Frequency  2x / week    OT Duration  8 weeks   plus eval   OT Treatment/Interventions  Self-care/ADL training;Aquatic Therapy;Building services engineer;Therapeutic exercise;Balance training;Splinting;Manual Therapy;Neuromuscular education;Ultrasound;Therapeutic activities;Energy conservation;Paraffin;DME and/or AE instruction;Cognitive remediation/compensation;Fluidtherapy;Gait Training;Moist Heat;Contrast Bath;Passive range of motion;Patient/family education    Plan  check goals, anticipate d/c    Consulted and Agree with Plan of Care  Patient       Patient will benefit from skilled therapeutic intervention in order to improve the following deficits and impairments:   Body Structure / Function / Physical Skills: ADL, Balance, Mobility, Strength, Flexibility, FMC, Coordination, Decreased knowledge of precautions, GMC, ROM, Decreased knowledge of use of DME, Dexterity, IADL Cognitive Skills: Attention, Emotional, Memory, Problem Solve,  Sequencing     Visit Diagnosis: Other lack of  coordination  Muscle weakness (generalized)  Attention and concentration deficit  Other symptoms and signs involving the nervous system  Stiffness of right wrist, not elsewhere classified  Stiffness of left wrist, not elsewhere classified    Problem List Patient Active Problem List   Diagnosis Date Noted  . Vitamin D deficiency 07/10/2019  . Vitamin B12 deficiency 07/10/2019  . Hypothyroidism   . Parkinson's disease ([place])   . Frequent falls   . Osteoporosis     Therron Sells 10/17/2019, 5:06 PM  [facility] [address] [place], [place], [address] Phone: [phone]   Fax:  [phone]  Name: Brailyn Killion MRN: [id number] Date of Birth: 02/08/1951

## 2019-10-21 NOTE — Telephone Encounter (Addendum)
Pt has called [facility] and they are stating they have nothing on file for her to pick up. Pt is wondering is she is suppose to discontinue this medication? If not, then pt needs to know the dosage for this medication.    [id number] [place], [place] - [address] Phone:  [phone]  Fax:  [phone]

## 2019-10-22 ENCOUNTER — Ambulatory Visit: Payer: Medicare PPO | Admitting: Occupational Therapy

## 2019-10-22 NOTE — Telephone Encounter (Signed)
Spoke with patient. See lab result note. 

## 2019-11-05 DIAGNOSIS — F4323 Adjustment disorder with mixed anxiety and depressed mood: Secondary | ICD-10-CM | POA: Diagnosis not present

## 2019-11-05 NOTE — Progress Notes (Signed)
Assessment/Plan:   1.   Parkinsons Disease, dx 2010/2011             -disease has been complicated by falls/dyskinesia/compulsive behavior (shopping).  Compulsive behaviors much better with getting off of pramipexole.             -Continue carbidopa/levodopa 25/100, 1.5 tablets at 6 AM/9 AM/noon/3 PM/6 PM              -She will continue carbidopa/levodopa 50/200 CR at bedtime             -d/c rasagaline - likely not providing great benefit and can contribute to confusion             -She was previously scheduled for neurocognitive testing, but canceled that.             -has RX for U-step but pt resistant.  Long discussion about the fact that she needs to be using that.  2.  GAD and depression             -on klonopin 0. 5mg  nightly  -On sertraline and being followed by NP at [facility] psychiatry   3.  Neurogenic Orthostatic Hypotension             -husband states that this has been an issue in past.  Will monitor.  Was okay today.  Discussed hydration.  Will give RX for compression binder today.  4.  ? Dysphagia  -Husband really thinks it is related to GAD  -MBE declined by pt  -Discussed that phlegm production is very common in Parkinson's disease  5.  Follow up is anticipated in the next 4-6 months, sooner should new neurologic issues arise.   Subjective:   [facility] was seen today in follow up for Parkinsons disease.  My previous records were reviewed prior to todays visit as well as outside records available to me.  Last visit, I recommended tapering the patient's pramipexole ER, 0.75 mg, given that she was having compulsive behavior and hallucinations.  Her husband states this is "much better and very good since off of pramipexole."  I did increase her levodopa.  She was scheduled for neurocognitive testing but canceled that.  She has been seen at [facility] psychiatry, but was seen by the nurse practitioner.  I reviewed the most recent note dated May 17.  Her  sertraline was increased to 100 mg daily.  husband states that her anxiety has been bad - pts mother not doing well.  BP has been down and was low this morning and they went to UC because of it this morning and "it was okay."  Had surgery in feb because of old fracture from fall.  Has had some falls but husband states that she is actually doing better.  Asks about swallowing trouble, but her husband states that he really thinks it is anxiety related.  Happens in the evening when anxiety is kicking up.  Current prescribed movement disorder medications:  Carbidopa/levodopa 25/100, 1.5 tablet at 6 AM/9 AM/noon/3 PM/6 PM  Carbidopa/levodopa 50/200 CR at bedtime (increased since our last visit) Sertraline 100 mg daily Clonazepam 0.5 mg, nightly (now being prescribed by nurse practitioner at [facility] psychiatry) Azilect, 1 mg daily   PREVIOUS MEDICATIONS: pramipexole (discontinued because of compulsive behavior and hallucinations); Requip; Paxil  ALLERGIES:   Allergies  Allergen Reactions  . Codeine Other (See Comments)    GI Upset  . Escitalopram Swelling    Foot swelling  .  Propoxyphene Other (See Comments)    Hallucinations    CURRENT MEDICATIONS:  Outpatient Encounter Medications as of 11/07/2019  Medication Sig  . Acetaminophen (TYLENOL PO) Take 500 mg by mouth as needed.  . AMBULATORY NON FORMULARY MEDICATION U step walker  Dx: G20  . carbidopa-levodopa (SINEMET CR) 50-200 MG tablet TAKE 1 TABLET BY MOUTH EVERYDAY AT BEDTIME  . carbidopa-levodopa (SINEMET IR) 25-100 MG tablet TAKE 1.5 TABLETS BY MOUTH 5 (FIVE) TIMES DAILY. 1.5 TABLET EVERY 3 HRS  . cholecalciferol (VITAMIN D3) 25 MCG (1000 UNIT) tablet Take 1,000 Units by mouth daily.  . clonazePAM (KLONOPIN) 0.5 MG tablet Take 1 tablet (0.5 mg total) by mouth at bedtime.  . ibandronate (BONIVA) 150 MG tablet TAKE 1 TABLET(S) EVERY MONTH BY ORAL ROUTE FOR 30 DAYS.  Marland Kitchen levothyroxine (SYNTHROID, LEVOTHROID) 50 MCG tablet Take by  mouth.  . MELATONIN PO Take 3 mg by mouth daily.  . rasagiline (AZILECT) 1 MG TABS tablet TAKE 1 TABLET BY MOUTH EVERY DAY  . rasagiline (AZILECT) 1 MG TABS tablet Take 1 tablet by mouth daily.  . sertraline (ZOLOFT) 50 MG tablet TAKE ONE AND 1/2 TABLETS BY MOUTH EVERY DAY FOR 7 DAYS, THEN INCREASE TO TWO TABLETS DAILY.   No facility-administered encounter medications on file as of 11/07/2019.    Objective:   PHYSICAL EXAMINATION:    VITALS:   Vitals:   11/07/19 1420  BP: (!) 131/56  Pulse: 68  Resp: 18  SpO2: 95%  Weight: 126 lb (57.2 kg)  Height: 5' 6.5" (1.689 m)    GEN:  The patient appears stated age and is in NAD. HEENT:  Normocephalic, atraumatic.  The mucous membranes are moist. The superficial temporal arteries are without ropiness or tenderness. CV:  RRR Lungs:  CTAB Neck/HEME:  There are no carotid bruits bilaterally.  Neurological examination:  Orientation: The patient is alert and oriented x3. Cranial nerves: There is good facial symmetry with facial hypomimia. The speech is fluent and clear. Soft palate rises symmetrically and there is no tongue deviation. Hearing is intact to conversational tone. Sensation: Sensation is intact to light touch throughout Motor: Strength is at least antigravity x4.  Movement examination: Tone: There is normal tone in the upper and lower extremities Abnormal movements: Patient is dyskinetic, albeit fairly mildly compared to previous.  She has left hand greater than right hand dyskinesia. Coordination:  There is mild decremation with RAM's Gait and Station: The patient has no difficulty arising out of a deep-seated chair without the use of the hands. The patient's stride length is good, but she is slightly unstable.  She has bilateral upper extremity dyskinesia with ambulation.    I have reviewed and interpreted the following labs independently    Chemistry      Component Value Date/Time   NA 138 07/10/2019 0919   K 3.8  07/10/2019 0919   CL 102 07/10/2019 0919   CO2 29 07/10/2019 0919   BUN 18 07/10/2019 0919   CREATININE 0.70 07/10/2019 0919      Component Value Date/Time   CALCIUM 9.1 07/10/2019 0919   ALKPHOS 80 07/10/2019 0919   AST 14 07/10/2019 0919   ALT 2 07/10/2019 0919   BILITOT 0.4 07/10/2019 0919       Lab Results  Component Value Date   WBC 7.2 07/10/2019   HGB 11.0 (L) 07/10/2019   HCT 32.6 (L) 07/10/2019   MCV 85.8 07/10/2019   PLT 342.0 07/10/2019    Lab Results  Component Value Date   TSH 2.14 07/10/2019     Total time spent on today's visit was 45 minutes, including both face-to-face time and nonface-to-face time.  Time included that spent on review of records (prior notes available to me/labs/imaging if pertinent), discussing treatment and goals, answering patient's questions and coordinating care.  Cc:  Philip Aspen, Limmie Patricia, MD

## 2019-11-07 ENCOUNTER — Other Ambulatory Visit: Payer: Self-pay

## 2019-11-07 ENCOUNTER — Encounter: Payer: Self-pay | Admitting: Neurology

## 2019-11-07 ENCOUNTER — Ambulatory Visit: Payer: Medicare PPO | Admitting: Neurology

## 2019-11-07 VITALS — BP 131/56 | HR 68 | Resp 18 | Ht 66.5 in | Wt 126.0 lb

## 2019-11-07 DIAGNOSIS — G20 Parkinson's disease: Secondary | ICD-10-CM | POA: Diagnosis not present

## 2019-11-07 DIAGNOSIS — F411 Generalized anxiety disorder: Secondary | ICD-10-CM

## 2019-11-07 DIAGNOSIS — G903 Multi-system degeneration of the autonomic nervous system: Secondary | ICD-10-CM | POA: Diagnosis not present

## 2019-11-07 DIAGNOSIS — G249 Dystonia, unspecified: Secondary | ICD-10-CM

## 2019-11-07 MED ORDER — AMBULATORY NON FORMULARY MEDICATION: 0 refills | Status: AC

## 2019-11-07 NOTE — Patient Instructions (Addendum)
1.  Stop rasagaline 2.  Try the abdominal compression binder while awake 3.  Use the U step walker  The physicians and staff at [facility] are committed to providing excellent care. You may receive a survey requesting feedback about your experience at our office. We strive to receive "very good" responses to the survey questions. If you feel that your experience would prevent you from giving the office a "very good " response, please contact our office to try to remedy the situation. We may be reached at [phone]. Thank you for taking the time out of your busy day to complete the survey.

## 2019-11-11 ENCOUNTER — Ambulatory Visit (INDEPENDENT_AMBULATORY_CARE_PROVIDER_SITE_OTHER): Payer: Medicare PPO | Admitting: Adult Health

## 2019-11-11 ENCOUNTER — Encounter: Payer: Self-pay | Admitting: Adult Health

## 2019-11-11 ENCOUNTER — Ambulatory Visit: Payer: Medicare PPO | Admitting: Neurology

## 2019-11-11 ENCOUNTER — Other Ambulatory Visit: Payer: Self-pay

## 2019-11-11 DIAGNOSIS — F411 Generalized anxiety disorder: Secondary | ICD-10-CM

## 2019-11-11 DIAGNOSIS — G4700 Insomnia, unspecified: Secondary | ICD-10-CM | POA: Diagnosis not present

## 2019-11-11 DIAGNOSIS — F410 Panic disorder [episodic paroxysmal anxiety] without agoraphobia: Secondary | ICD-10-CM

## 2019-11-11 DIAGNOSIS — F331 Major depressive disorder, recurrent, moderate: Secondary | ICD-10-CM | POA: Diagnosis not present

## 2019-11-11 NOTE — Progress Notes (Signed)
Kelly Howard [id number] Nov 17, 1950 69 y.o.  Subjective:   Patient ID:  Kelly Howard is a 69 y.o. (DOB [birth year]) female.  Chief Complaint: No chief complaint on file.   HPI Kelly Howard presents to the office today for follow-up of GAD, MDD, panic attacks, and insomnia.   Describes mood today as "so-so". Tearful at times. Mood symptoms - denies depression and irritability. Continues to feels anxious. Denies panic attacks. Taking one Clonazepam at bedtime. Decreased worry and rumination. Has difficulties "more anxious" riding in cars for long periods of time. Husband feels like she is a "tad" better. Restlessness comes and goes - not "constant". Taking Clonazepam 0.5mg  at bedtime and Zoloft 100mg  daily. Stable interest and motivation. Taking medications as prescribed.  Diagnosed with Parkinson's in 2010.  Energy levels vary - does better in the mornings. Active, exercising. Walking some days.    Enjoys some usual interests and activities. Married. Lives with husband of 46 years. Has 2 daughters - 3 grandchildren. Spending time with family. Mother is a 25 years old.  Appetite adequate. Weight gain - 131 pounds.  Sleeps well most nights. Averages 7 hours - broken sleep. Up and down during the night.   Focus and concentration better. Has been able to read again. Finished a book recently. Completing tasks cooking -other tasks. Managing aspects of household.   Denies SI or HI. Denies AH or VH. Has 2 therapists - one for Parkinson's and one for daughter's issues.   Previous medication trials: Clonazepam, Zoloft, Paxil, Celexa   PHQ2-9     Office Visit from 04/11/2019 in [facility] [place] Total Score 0  PHQ-9 Total Score 3       Review of Systems:  Review of Systems  Musculoskeletal: Negative for gait problem.  Neurological: Negative for tremors.  Psychiatric/Behavioral:       Please refer to HPI    Medications: I have reviewed the  patient's current medications.  Current Outpatient Medications  Medication Sig Dispense Refill   Acetaminophen (TYLENOL PO) Take 500 mg by mouth as needed.     AMBULATORY NON FORMULARY MEDICATION U step walker  Dx: G20 1 Device 0   AMBULATORY NON FORMULARY MEDICATION Abdominal compression binder Dx: G20 1 Device 0   carbidopa-levodopa (SINEMET CR) 50-200 MG tablet TAKE 1 TABLET BY MOUTH EVERYDAY AT BEDTIME 30 tablet 1   carbidopa-levodopa (SINEMET IR) 25-100 MG tablet TAKE 1.5 TABLETS BY MOUTH 5 (FIVE) TIMES DAILY. 1.5 TABLET EVERY 3 HRS 450 tablet 1   cholecalciferol (VITAMIN D3) 25 MCG (1000 UNIT) tablet Take 1,000 Units by mouth daily.     clonazePAM (KLONOPIN) 0.5 MG tablet Take 1 tablet (0.5 mg total) by mouth at bedtime. 30 tablet 2   ibandronate (BONIVA) 150 MG tablet TAKE 1 TABLET(S) EVERY MONTH BY ORAL ROUTE FOR 30 DAYS.     levothyroxine (SYNTHROID, LEVOTHROID) 50 MCG tablet Take by mouth.     MELATONIN PO Take 3 mg by mouth daily.     rasagiline (AZILECT) 1 MG TABS tablet TAKE 1 TABLET BY MOUTH EVERY DAY 90 tablet 1   rasagiline (AZILECT) 1 MG TABS tablet Take 1 tablet by mouth daily.     sertraline (ZOLOFT) 50 MG tablet TAKE ONE AND 1/2 TABLETS BY MOUTH EVERY DAY FOR 7 DAYS, THEN INCREASE TO TWO TABLETS DAILY. 180 tablet 1   No current facility-administered medications for this visit.    Medication Side Effects: None  Allergies:  Allergies  Allergen  Reactions   Codeine Other (See Comments)    GI Upset   Escitalopram Swelling    Foot swelling   Propoxyphene Other (See Comments)    Hallucinations    Past Medical History:  Diagnosis Date   Frequent falls    Hypothyroidism    Osteoporosis    Parkinson's disease (HCC)     Family History  Problem Relation Age of Onset   Hypercalcemia Mother    Cancer Paternal Grandmother    Diabetes Paternal Grandfather     Social History   Socioeconomic History   Marital status: Married    Spouse  name: Not on file   Number of children: Not on file   Years of education: Not on file   Highest education level: Not on file  Occupational History   Not on file  Tobacco Use   Smoking status: Never Smoker   Smokeless tobacco: Never Used  Substance and Sexual Activity   Alcohol use: Yes   Drug use: Never   Sexual activity: Not on file  Other Topics Concern   Not on file  Social History Narrative   Not on file   Social Determinants of Health   Financial Resource Strain:    Difficulty of Paying Living Expenses:   Food Insecurity:    Worried About Programme researcher, broadcasting/film/video in the Last Year:    Barista in the Last Year:   Transportation Needs:    Freight forwarder (Medical):    Lack of Transportation (Non-Medical):   Physical Activity:    Days of Exercise per Week:    Minutes of Exercise per Session:   Stress:    Feeling of Stress :   Social Connections:    Frequency of Communication with Friends and Family:    Frequency of Social Gatherings with Friends and Family:    Attends Religious Services:    Active Member of Clubs or Organizations:    Attends Engineer, structural:    Marital Status:   Intimate Partner Violence:    Fear of Current or Ex-Partner:    Emotionally Abused:    Physically Abused:    Sexually Abused:     Past Medical History, Surgical history, Social history, and Family history were reviewed and updated as appropriate.   Please see review of systems for further details on the patient's review from today.   Objective:   Physical Exam:  LMP  (LMP Unknown) Comment: gyn  Physical Exam Constitutional:      General: She is not in acute distress. Musculoskeletal:        General: No deformity.  Neurological:     Mental Status: She is alert and oriented to person, place, and time.     Coordination: Coordination normal.  Psychiatric:        Attention and Perception: Attention and perception normal. She does  not perceive auditory or visual hallucinations.        Mood and Affect: Mood normal. Mood is not anxious or depressed. Affect is not labile, blunt, angry or inappropriate.        Speech: Speech normal.        Behavior: Behavior normal.        Thought Content: Thought content normal. Thought content is not paranoid or delusional. Thought content does not include homicidal or suicidal ideation. Thought content does not include homicidal or suicidal plan.        Cognition and Memory: Cognition and memory normal.  Judgment: Judgment normal.     Comments: Insight intact     Lab Review:     Component Value Date/Time   NA 138 07/10/2019 0919   K 3.8 07/10/2019 0919   CL 102 07/10/2019 0919   CO2 29 07/10/2019 0919   GLUCOSE 85 07/10/2019 0919   BUN 18 07/10/2019 0919   CREATININE 0.70 07/10/2019 0919   CALCIUM 9.1 07/10/2019 0919   PROT 6.7 07/10/2019 0919   ALBUMIN 4.3 07/10/2019 0919   AST 14 07/10/2019 0919   ALT 2 07/10/2019 0919   ALKPHOS 80 07/10/2019 0919   BILITOT 0.4 07/10/2019 0919       Component Value Date/Time   WBC 7.2 07/10/2019 0919   RBC 3.80 (L) 07/10/2019 0919   HGB 11.0 (L) 07/10/2019 0919   HCT 32.6 (L) 07/10/2019 0919   PLT 342.0 07/10/2019 0919   MCV 85.8 07/10/2019 0919   MCHC 33.7 07/10/2019 0919   RDW 14.2 07/10/2019 0919   LYMPHSABS 1.0 07/10/2019 0919   MONOABS 0.8 07/10/2019 0919   EOSABS 0.1 07/10/2019 0919   BASOSABS 0.1 07/10/2019 0919    No results found for: POCLITH, LITHIUM   No results found for: PHENYTOIN, PHENOBARB, VALPROATE, CBMZ   .res Assessment: Plan:    Plan:  PDMP reviewed  1. Continue Zoloft 100mg  daily 2. Continue Clonazepam 0.5mg  at hs   RTC 8 weeks  Patient advised to contact office with any questions, adverse effects, or acute worsening in signs and symptoms.  Discussed potential benefits, risk, and side effects of benzodiazepines to include potential risk of tolerance and dependence, as well as possible  drowsiness.  Advised patient not to drive if experiencing drowsiness and to take lowest possible effective dose to minimize risk of dependence and tolerance.  Diagnoses and all orders for this visit:  Major depressive disorder, recurrent episode, moderate (HCC)  Generalized anxiety disorder  Insomnia, unspecified type  Panic attacks     Please see After Visit Summary for patient specific instructions.  Future Appointments  Date Time Provider [place]  12/31/2019  9:30 AM [name], Rayford Halsted, MD [facility]  05/12/2020  2:30 PM Tat, Eustace Quail, DO [facility] None    No orders of the defined types were placed in this encounter.   -------------------------------

## 2019-11-12 ENCOUNTER — Encounter: Payer: Self-pay | Admitting: Family Medicine

## 2019-11-12 ENCOUNTER — Telehealth (INDEPENDENT_AMBULATORY_CARE_PROVIDER_SITE_OTHER): Payer: Medicare PPO | Admitting: Family Medicine

## 2019-11-12 VITALS — Temp 98.1°F | Ht 66.5 in | Wt 126.0 lb

## 2019-11-12 DIAGNOSIS — E559 Vitamin D deficiency, unspecified: Secondary | ICD-10-CM | POA: Diagnosis not present

## 2019-11-12 DIAGNOSIS — R0981 Nasal congestion: Secondary | ICD-10-CM | POA: Diagnosis not present

## 2019-11-12 DIAGNOSIS — E538 Deficiency of other specified B group vitamins: Secondary | ICD-10-CM

## 2019-11-12 DIAGNOSIS — R0982 Postnasal drip: Secondary | ICD-10-CM

## 2019-11-12 DIAGNOSIS — R300 Dysuria: Secondary | ICD-10-CM | POA: Diagnosis not present

## 2019-11-12 MED ORDER — NITROFURANTOIN MONOHYD MACRO 100 MG PO CAPS: 100.0000 mg | ORAL_CAPSULE | Freq: Two times a day (BID) | ORAL | 0 refills | Status: DC

## 2019-11-12 NOTE — Progress Notes (Signed)
Virtual Visit via Video Note  I connected with Kelly Howard  on 11/12/19 at 11:00 AM EDT by a video enabled telemedicine application and verified that I am speaking with the correct person using two identifiers.  Location patient: home, [place] Location provider:work or home office Persons participating in the virtual visit: patient, provider, husband  I discussed the limitations of evaluation and management by telemedicine and the availability of in person appointments. The patient expressed understanding and agreed to proceed.   HPI:  Acute visit for several issues:  Dysuria: -for 1 week -urinary frequency, urgency, mild dysuria -denies fevers, flank pain, hematuria, sig malaise -hx of UTI, feels similar to her and she wants to try an abx rather than coming into the office to check urine  UR symptoms: -for several days -sneezing, runny nose, PND - sensation in throat of drainage -thought maybe allergies -no fevers, SOB, sig cough or malaise -per chart fully vaccinated for COVID19  B12 and Vit D follow up: -had been doing weekly 50,000 IU VIt D2 and B12 inj -had a Vit D level check recently and normal -has not had a recheck of B12 had been doing monthly b12 inj -wonders next steps -las b12 inj about 1 month ago   ROS: See pertinent positives and negatives per HPI.  Past Medical History:  Diagnosis Date   Frequent falls    Hypothyroidism    Osteoporosis    Parkinson's disease (HCC)     Past Surgical History:  Procedure Laterality Date   TONSILLECTOMY     WRIST SURGERY      Family History  Problem Relation Age of Onset   Hypercalcemia Mother    Cancer Paternal Grandmother    Diabetes Paternal Grandfather     SOCIAL HX: see hpi   Current Outpatient Medications:    Acetaminophen (TYLENOL PO), Take 500 mg by mouth as needed., Disp: , Rfl:    AMBULATORY NON FORMULARY MEDICATION, U step walker  Dx: G20, Disp: 1 Device, Rfl: 0   AMBULATORY NON FORMULARY  MEDICATION, Abdominal compression binder Dx: G20, Disp: 1 Device, Rfl: 0   carbidopa-levodopa (SINEMET CR) 50-200 MG tablet, TAKE 1 TABLET BY MOUTH EVERYDAY AT BEDTIME, Disp: 30 tablet, Rfl: 1   carbidopa-levodopa (SINEMET IR) 25-100 MG tablet, TAKE 1.5 TABLETS BY MOUTH 5 (FIVE) TIMES DAILY. 1.5 TABLET EVERY 3 HRS, Disp: 450 tablet, Rfl: 1   cholecalciferol (VITAMIN D3) 25 MCG (1000 UNIT) tablet, Take 1,000 Units by mouth daily., Disp: , Rfl:    clonazePAM (KLONOPIN) 0.5 MG tablet, Take 1 tablet (0.5 mg total) by mouth at bedtime., Disp: 30 tablet, Rfl: 2   ibandronate (BONIVA) 150 MG tablet, TAKE 1 TABLET(S) EVERY MONTH BY ORAL ROUTE FOR 30 DAYS., Disp: , Rfl:    levothyroxine (SYNTHROID, LEVOTHROID) 50 MCG tablet, Take by mouth., Disp: , Rfl:    MELATONIN PO, Take 3 mg by mouth daily., Disp: , Rfl:    rasagiline (AZILECT) 1 MG TABS tablet, TAKE 1 TABLET BY MOUTH EVERY DAY, Disp: 90 tablet, Rfl: 1   rasagiline (AZILECT) 1 MG TABS tablet, Take 1 tablet by mouth daily., Disp: , Rfl:    sertraline (ZOLOFT) 50 MG tablet, TAKE ONE AND 1/2 TABLETS BY MOUTH EVERY DAY FOR 7 DAYS, THEN INCREASE TO TWO TABLETS DAILY., Disp: 180 tablet, Rfl: 1   nitrofurantoin, macrocrystal-monohydrate, (MACROBID) 100 MG capsule, Take 1 capsule (100 mg total) by mouth 2 (two) times daily., Disp: 14 capsule, Rfl: 0  EXAM:  VITALS per patient  if applicable:  GENERAL: alert, oriented, appears well and in no acute distress  HEENT: atraumatic, conjunttiva clear, no obvious abnormalities on inspection of external nose and ears  NECK: normal movements of the head and neck  LUNGS: on inspection no signs of respiratory distress, breathing rate appears normal, no obvious gross SOB, gasping or wheezing  CV: no obvious cyanosis  MS: moves all visible extremities without noticeable abnormality  PSYCH/NEURO: pleasant and cooperative, no obvious depression or anxiety, speech and thought processing grossly  intact  ASSESSMENT AND PLAN:  Discussed the following assessment and plan:  Dysuria  Vitamin B 12 deficiency - Plan: Vitamin B12  Vitamin D deficiency  Nasal congestion  PND (post-nasal drip)  -we discussed possible serious and likely etiologies, options for evaluation and workup, limitations of telemedicine visit vs in person visit, treatment, treatment risks and precautions. Pt prefers to treat via telemedicine empirically rather then risking or undertaking an in person visit at this moment. For the dysuria she opted to try empiric tx with macrobid 100mg bid x 7 days and agrees to inperson visit if not resolved. For the upper resp symptoms query AR vs VURI vs other and she opted to try saline and allegra. For the vitamin deficiencies, discussed various options. She has decided to try oral Vit D3 drops, 1000 IU daily and sublingual B12 [phone] mcg daily with a rechck and follow up with PCP in about 1 month. Sent order for b12 check and message to scheduler to schedule lab appt and PCP follow up a few days agter the lab appt.Patient agrees to seek prompt in person care if worsening, new symptoms arise, or if is not improving with treatment.   I discussed the assessment and treatment plan with the patient. The patient was provided an opportunity to ask questions and all were answered. The patient agreed with the plan and demonstrated an understanding of the instructions.   The patient was advised to call back or seek an in-person evaluation if the symptoms worsen or if the condition fails to improve as anticipated.   Lucretia Kern, DO

## 2019-11-14 ENCOUNTER — Telehealth: Payer: Self-pay | Admitting: Internal Medicine

## 2019-11-14 DIAGNOSIS — F4323 Adjustment disorder with mixed anxiety and depressed mood: Secondary | ICD-10-CM | POA: Diagnosis not present

## 2019-11-14 NOTE — Telephone Encounter (Signed)
LVM to schedule pt for labs and follow up

## 2019-11-14 NOTE — Telephone Encounter (Signed)
-----   Message from Terressa Koyanagi, DO sent at 11/12/2019 12:06 PM EDT ----- Please assist:-needs lab visit in 4 weeks with follow up/virtual or in person 3 days after the lab visit. Thanks!

## 2019-11-14 NOTE — Telephone Encounter (Signed)
Patient returned call- appts are scheduled.

## 2019-11-18 ENCOUNTER — Telehealth: Payer: Self-pay | Admitting: Neurology

## 2019-11-18 NOTE — Telephone Encounter (Signed)
Call went to voicemail

## 2019-11-18 NOTE — Telephone Encounter (Signed)
Patient called and said she sometimes feels like she "cannot take a full breath and having some changes with low energy and walking."

## 2019-11-19 NOTE — Telephone Encounter (Signed)
We addressed these at last visit (last week I believe).  She doesn't have a great memory, but her husband was at visit.  We discussed that husband thinks that both of these things but especially lack of breath are associated with anxiety/panic and she needs to f/u with psychiatry about that.  I also told her that any other SOB is not Parkinsons Disease related and to f/u with PCP to make sure nothing else going on.

## 2019-11-19 NOTE — Telephone Encounter (Signed)
Spoke with pt who states she thinks difficulty breathing could be r/t anxiety, saw her psychiatrist last week and had a medication change, suggested she contact their office and let them know and find out if a dose change is needed. Also inst she should see PCP to have labs and f/u to see if any other underlying health issues. Pt verbalized understanding and agreed to f/u with both PCP and psychiatry.Marland Kitchen

## 2019-12-04 ENCOUNTER — Other Ambulatory Visit: Payer: Self-pay | Admitting: Neurology

## 2019-12-09 ENCOUNTER — Ambulatory Visit: Payer: Medicare PPO | Admitting: Internal Medicine

## 2019-12-09 ENCOUNTER — Encounter: Payer: Self-pay | Admitting: Internal Medicine

## 2019-12-09 ENCOUNTER — Other Ambulatory Visit: Payer: Self-pay

## 2019-12-09 VITALS — BP 126/68 | HR 84 | Temp 98.0°F | Ht 66.5 in | Wt 125.6 lb

## 2019-12-09 DIAGNOSIS — G20 Parkinson's disease: Secondary | ICD-10-CM | POA: Diagnosis not present

## 2019-12-09 DIAGNOSIS — R1310 Dysphagia, unspecified: Secondary | ICD-10-CM | POA: Diagnosis not present

## 2019-12-09 DIAGNOSIS — F419 Anxiety disorder, unspecified: Secondary | ICD-10-CM | POA: Diagnosis not present

## 2019-12-09 NOTE — Patient Instructions (Signed)
Will send a message to your team  About whether to order any testing such as  Swallowing but  No alarming findings  Based on your visit today .     Dysphagia  Dysphagia is trouble swallowing. This condition occurs when solids and liquids stick in a person's throat on the way down to the stomach, or when food takes longer to get to the stomach than usual. You may have problems swallowing food, liquids, or both. You may also have pain while trying to swallow. It may take you more time and effort to swallow something. What are the causes? This condition may be caused by:  Muscle problems. They may make it difficult for you to move food and liquids through the esophagus, which is the tube that connects your mouth to your stomach.  Blockages. You may have ulcers, scar tissue, or inflammation that blocks the normal passage of food and liquids. Causes of these problems include: ? Acid reflux from your stomach into your esophagus (gastroesophageal reflux). ? Infections. ? Radiation treatment for cancer. ? Medicines taken without enough fluids to wash them down into your stomach.  Stroke. This can affect the nerves and make it difficult to swallow.  Nerve problems. These prevent signals from being sent to the muscles of your esophagus to squeeze (contract) and move what you swallow down to your stomach.  Globus pharyngeus. This is a common problem that involves a feeling like something is stuck in your throat or a sense of trouble with swallowing, even though nothing is wrong with the swallowing passages.  Certain conditions, such as cerebral palsy or Parkinson's disease. What are the signs or symptoms? Common symptoms of this condition include:  A feeling that solids or liquids are stuck in your throat on the way down to the stomach.  Pain while swallowing.  Coughing or gagging while trying to swallow. Other symptoms include:  Food moving back from your stomach to your mouth  (regurgitation).  Noises coming from your throat.  Chest discomfort with swallowing.  A feeling of fullness when swallowing.  Drooling, especially when the throat is blocked.  Heartburn. How is this diagnosed? This condition may be diagnosed by:  Barium X-ray. In this test, you will swallow a white liquid that sticks to the inside of your esophagus. X-ray images are then taken.  Endoscopy. In this test, a flexible telescope is inserted down your throat to look at your esophagus and your stomach.  CT scans and an MRI. How is this treated? Treatment for dysphagia depends on the cause of this condition, such as:  If the dysphagia is caused by acid reflux or infection, medicines may be used. They may include antibiotics and heartburn medicines.  If the dysphagia is caused by problems with the muscles, swallowing therapy may be used to help you strengthen your swallowing muscles. You may have to do specific exercises to strengthen the muscles or stretch them.  If the dysphagia is caused by a blockage or mass, procedures to remove the blockage may be done. You may need surgery and a feeding tube. You may need to make diet changes. Ask your health care provider for specific instructions. Follow these instructions at home: Medicines  Take over-the-counter and prescription medicines only as told by your health care provider.  If you were prescribed an antibiotic medicine, take it as told by your health care provider. Do not stop taking the antibiotic even if you start to feel better. Eating and drinking   Follow  any diet changes as told by your health care provider.  Work with a diet and nutrition specialist (dietitian) to create an eating plan that will help you get the nutrients you need in order to stay healthy.  Eat soft foods that are easier to swallow.  Cut your food into small pieces and eat slowly. Take small bites.  Eat and drink only when you are sitting upright.  Do  not drink alcohol or caffeine. If you need help quitting, ask your health care provider. General instructions  Check your weight every day to make sure you are not losing weight.  Do not use any products that contain nicotine or tobacco, such as cigarettes, e-cigarettes, and chewing tobacco. If you need help quitting, ask your health care provider.  Keep all follow-up visits as told by your health care provider. This is important. Contact a health care provider if you:  Lose weight because you cannot swallow.  Cough when you drink liquids.  Cough up partially digested food. Get help right away if you:  Cannot swallow your saliva.  Have shortness of breath, a fever, or both.  Have a hoarse voice and also have trouble swallowing. Summary  Dysphagia is trouble swallowing. This condition occurs when solids and liquids stick in a person's throat on the way down to the stomach. You may cough or gag while trying to swallow.  Dysphagia has many possible causes.  Treatment for dysphagia depends on the cause of the condition.  Keep all follow-up visits as told by your health care provider. This is important. This information is not intended to replace advice given to you by your health care provider. Make sure you discuss any questions you have with your health care provider. Document Revised: 10/10/2018 Document Reviewed: 10/10/2018 Elsevier Patient Education  2020 [employer].

## 2019-12-09 NOTE — Progress Notes (Signed)
Chief Complaint  Patient presents with  . Shortness of Breath    patient can hardly swallow, seems like mucous gets stuck, hard to breath also    HPI: Kelly Howard 69 y.o. come in for SDA PCP NA  Has noted over the past few weeks feeling of  More difficulty in swallowing usually in evening  Has hot drink with help .  No choking vomiting HB or relation to eating although  Has dryness and harder to eat cracker than other moist foods .   Feels like lump in mid lower trachea area . No cough  And positional no stridor or wheeze  Usually  Evening  Not s ure if important to Parkinson  Or anxiety . No episode of pill caught but  Is on boniva and takes as directed  Has positional snoring at night but no choking?   Lots of stress and anxiety .     ROS: See pertinent positives and negatives per HPI. No fever  waterbrash   Past Medical History:  Diagnosis Date  . Frequent falls   . Hypothyroidism   . Osteoporosis   . Parkinson's disease (HCC)     Family History  Problem Relation Age of Onset  . Hypercalcemia Mother   . Cancer Paternal Grandmother   . Diabetes Paternal Grandfather     Social History   Socioeconomic History  . Marital status: Married    Spouse name: Not on file  . Number of children: Not on file  . Years of education: Not on file  . Highest education level: Not on file  Occupational History  . Not on file  Tobacco Use  . Smoking status: Never Smoker  . Smokeless tobacco: Never Used  Vaping Use  . Vaping Use: Never used  Substance and Sexual Activity  . Alcohol use: Yes  . Drug use: Never  . Sexual activity: Not on file  Other Topics Concern  . Not on file  Social History Narrative  . Not on file   Social Determinants of Health   Financial Resource Strain:   . Difficulty of Paying Living Expenses:   Food Insecurity:   . Worried About Programme researcher, broadcasting/film/videounning Out of Food in the Last Year:   . Baristaan Out of Food in the Last Year:   Transportation Needs:   .  Freight forwarderLack of Transportation (Medical):   Marland Kitchen. Lack of Transportation (Non-Medical):   Physical Activity:   . Days of Exercise per Week:   . Minutes of Exercise per Session:   Stress:   . Feeling of Stress :   Social Connections:   . Frequency of Communication with Friends and Family:   . Frequency of Social Gatherings with Friends and Family:   . Attends Religious Services:   . Active Member of Clubs or Organizations:   . Attends BankerClub or Organization Meetings:   Marland Kitchen. Marital Status:     Outpatient Medications Prior to Visit  Medication Sig Dispense Refill  . Acetaminophen (TYLENOL PO) Take 500 mg by mouth as needed.    . AMBULATORY NON FORMULARY MEDICATION U step walker  Dx: G20 1 Device 0  . AMBULATORY NON FORMULARY MEDICATION Abdominal compression binder Dx: G20 1 Device 0  . carbidopa-levodopa (SINEMET CR) 50-200 MG tablet TAKE 1 TABLET BY MOUTH EVERYDAY AT BEDTIME 30 tablet 1  . carbidopa-levodopa (SINEMET IR) 25-100 MG tablet TAKE 1.5 TABLETS BY MOUTH 5 (FIVE) TIMES DAILY. 1.5 TABLET EVERY 3 HRS 450 tablet 1  . cholecalciferol (  VITAMIN D3) 25 MCG (1000 UNIT) tablet Take 1,000 Units by mouth daily.    . clonazePAM (KLONOPIN) 0.5 MG tablet Take 1 tablet (0.5 mg total) by mouth at bedtime. 30 tablet 2  . ibandronate (BONIVA) 150 MG tablet TAKE 1 TABLET(S) EVERY MONTH BY ORAL ROUTE FOR 30 DAYS.    Marland Kitchen levothyroxine (SYNTHROID, LEVOTHROID) 50 MCG tablet Take by mouth.    . MELATONIN PO Take 3 mg by mouth daily.    . rasagiline (AZILECT) 1 MG TABS tablet TAKE 1 TABLET BY MOUTH EVERY DAY 90 tablet 1  . rasagiline (AZILECT) 1 MG TABS tablet Take 1 tablet by mouth daily.    . sertraline (ZOLOFT) 50 MG tablet TAKE ONE AND 1/2 TABLETS BY MOUTH EVERY DAY FOR 7 DAYS, THEN INCREASE TO TWO TABLETS DAILY. 180 tablet 1  . nitrofurantoin, macrocrystal-monohydrate, (MACROBID) 100 MG capsule Take 1 capsule (100 mg total) by mouth 2 (two) times daily. (Patient not taking: Reported on 12/09/2019) 14 capsule 0    No facility-administered medications prior to visit.     EXAM:  BP 126/68   Pulse 84   Temp 98 F (36.7 C) (Oral)   Ht 5' 6.5" (1.689 m)   Wt 125 lb 9.6 oz (57 kg)   LMP  (LMP Unknown) Comment: gyn  SpO2 99%   BMI 19.97 kg/m   Body mass index is 19.97 kg/m.  GENERAL: vitals reviewed and listed above, alert, oriented, appears well hydrated and in no acute distress some increase motor activity dystonixa? HEENT: atraumatic, conjunctiva  clear, no obvious abnormalities on inspection of external nose and ears OP : no lesion edema or exudate  obious elevated soft palate  NECK: no obvious masses on inspection palpation  LUNGS: clear to auscultation bilaterally, no wheezes, rales or rhonchi, good air movement CV: HRRR, no clubbing cyanosis or  peripheral edema nl cap refill  MS: moves all extremities without noticeable focal  Abnormality no fasciculations  PSYCH: pleasant and cooperative,seems mentally clear and anxious  Lab Results  Component Value Date   WBC 7.2 07/10/2019   HGB 11.0 (L) 07/10/2019   HCT 32.6 (L) 07/10/2019   PLT 342.0 07/10/2019   GLUCOSE 85 07/10/2019   CHOL 198 07/10/2019   TRIG 68.0 07/10/2019   HDL 69.50 07/10/2019   LDLCALC 115 (H) 07/10/2019   ALT 2 07/10/2019   AST 14 07/10/2019   NA 138 07/10/2019   K 3.8 07/10/2019   CL 102 07/10/2019   CREATININE 0.70 07/10/2019   BUN 18 07/10/2019   CO2 29 07/10/2019   TSH 2.14 07/10/2019   HGBA1C 5.7 07/10/2019   BP Readings from Last 3 Encounters:  12/09/19 126/68  11/07/19 (!) 131/56  10/14/19 119/73    ASSESSMENT AND PLAN:  Discussed the following assessment and plan:  Alteration in swallowing function  Parkinson disease (HCC)  Anxiety Exam is reassuring and no choking    Anxiety playing a role but not certain  If completely related  Reviewed physiology of swallowing and diff dx and lack of alarming findings .  Will related to her med team  Advisability of getting a swallowing study    And also have her discuss with her therapist  That anxiety playing  A role -Patient advised to return or notify health care team  if  new concerns arise. In th interim   Patient Instructions  Will send a message to your team  About whether to order any testing such as  Swallowing but  No alarming findings  Based on your visit today .     Dysphagia  Dysphagia is trouble swallowing. This condition occurs when solids and liquids stick in a person's throat on the way down to the stomach, or when food takes longer to get to the stomach than usual. You may have problems swallowing food, liquids, or both. You may also have pain while trying to swallow. It may take you more time and effort to swallow something. What are the causes? This condition may be caused by:  Muscle problems. They may make it difficult for you to move food and liquids through the esophagus, which is the tube that connects your mouth to your stomach.  Blockages. You may have ulcers, scar tissue, or inflammation that blocks the normal passage of food and liquids. Causes of these problems include: ? Acid reflux from your stomach into your esophagus (gastroesophageal reflux). ? Infections. ? Radiation treatment for cancer. ? Medicines taken without enough fluids to wash them down into your stomach.  Stroke. This can affect the nerves and make it difficult to swallow.  Nerve problems. These prevent signals from being sent to the muscles of your esophagus to squeeze (contract) and move what you swallow down to your stomach.  Globus pharyngeus. This is a common problem that involves a feeling like something is stuck in your throat or a sense of trouble with swallowing, even though nothing is wrong with the swallowing passages.  Certain conditions, such as cerebral palsy or Parkinson's disease. What are the signs or symptoms? Common symptoms of this condition include:  A feeling that solids or liquids are stuck in your throat  on the way down to the stomach.  Pain while swallowing.  Coughing or gagging while trying to swallow. Other symptoms include:  Food moving back from your stomach to your mouth (regurgitation).  Noises coming from your throat.  Chest discomfort with swallowing.  A feeling of fullness when swallowing.  Drooling, especially when the throat is blocked.  Heartburn. How is this diagnosed? This condition may be diagnosed by:  Barium X-ray. In this test, you will swallow a white liquid that sticks to the inside of your esophagus. X-ray images are then taken.  Endoscopy. In this test, a flexible telescope is inserted down your throat to look at your esophagus and your stomach.  CT scans and an MRI. How is this treated? Treatment for dysphagia depends on the cause of this condition, such as:  If the dysphagia is caused by acid reflux or infection, medicines may be used. They may include antibiotics and heartburn medicines.  If the dysphagia is caused by problems with the muscles, swallowing therapy may be used to help you strengthen your swallowing muscles. You may have to do specific exercises to strengthen the muscles or stretch them.  If the dysphagia is caused by a blockage or mass, procedures to remove the blockage may be done. You may need surgery and a feeding tube. You may need to make diet changes. Ask your health care provider for specific instructions. Follow these instructions at home: Medicines  Take over-the-counter and prescription medicines only as told by your health care provider.  If you were prescribed an antibiotic medicine, take it as told by your health care provider. Do not stop taking the antibiotic even if you start to feel better. Eating and drinking   Follow any diet changes as told by your health care provider.  Work with a diet and nutrition specialist (dietitian) to  create an eating plan that will help you get the nutrients you need in order to stay  healthy.  Eat soft foods that are easier to swallow.  Cut your food into small pieces and eat slowly. Take small bites.  Eat and drink only when you are sitting upright.  Do not drink alcohol or caffeine. If you need help quitting, ask your health care provider. General instructions  Check your weight every day to make sure you are not losing weight.  Do not use any products that contain nicotine or tobacco, such as cigarettes, e-cigarettes, and chewing tobacco. If you need help quitting, ask your health care provider.  Keep all follow-up visits as told by your health care provider. This is important. Contact a health care provider if you:  Lose weight because you cannot swallow.  Cough when you drink liquids.  Cough up partially digested food. Get help right away if you:  Cannot swallow your saliva.  Have shortness of breath, a fever, or both.  Have a hoarse voice and also have trouble swallowing. Summary  Dysphagia is trouble swallowing. This condition occurs when solids and liquids stick in a person's throat on the way down to the stomach. You may cough or gag while trying to swallow.  Dysphagia has many possible causes.  Treatment for dysphagia depends on the cause of the condition.  Keep all follow-up visits as told by your health care provider. This is important. This information is not intended to replace advice given to you by your health care provider. Make sure you discuss any questions you have with your health care provider. Document Revised: 10/10/2018 Document Reviewed: 10/10/2018 Elsevier Patient Education  2020 [employer].       Kelly Howard M.D.

## 2019-12-10 DIAGNOSIS — F4323 Adjustment disorder with mixed anxiety and depressed mood: Secondary | ICD-10-CM | POA: Diagnosis not present

## 2019-12-11 ENCOUNTER — Telehealth: Payer: Self-pay | Admitting: Adult Health

## 2019-12-11 NOTE — Telephone Encounter (Signed)
Will call.

## 2019-12-11 NOTE — Telephone Encounter (Signed)
Can you follow up?

## 2019-12-11 NOTE — Telephone Encounter (Signed)
Kelly Howard called very nervous and anxious about her present condition.  Wanted to come tonight for a visit.  Something seems to wrong with her medication.  Really needs a call this evening.  She did make an appt for tomorrow but said she couldn't wait til then.

## 2019-12-12 ENCOUNTER — Encounter: Payer: Self-pay | Admitting: Adult Health

## 2019-12-12 ENCOUNTER — Other Ambulatory Visit: Payer: Self-pay

## 2019-12-12 ENCOUNTER — Ambulatory Visit (INDEPENDENT_AMBULATORY_CARE_PROVIDER_SITE_OTHER): Payer: Medicare PPO | Admitting: Adult Health

## 2019-12-12 DIAGNOSIS — F411 Generalized anxiety disorder: Secondary | ICD-10-CM | POA: Diagnosis not present

## 2019-12-12 DIAGNOSIS — F410 Panic disorder [episodic paroxysmal anxiety] without agoraphobia: Secondary | ICD-10-CM

## 2019-12-12 DIAGNOSIS — G4700 Insomnia, unspecified: Secondary | ICD-10-CM

## 2019-12-12 DIAGNOSIS — F331 Major depressive disorder, recurrent, moderate: Secondary | ICD-10-CM | POA: Diagnosis not present

## 2019-12-12 MED ORDER — CLONAZEPAM 0.5 MG PO TABS: 0.5000 mg | ORAL_TABLET | Freq: Two times a day (BID) | ORAL | 2 refills | Status: DC

## 2019-12-12 MED ORDER — SERTRALINE HCL 100 MG PO TABS: ORAL_TABLET | ORAL | 3 refills | Status: DC

## 2019-12-12 NOTE — Progress Notes (Signed)
Shanayah Howard [id number] [birth year] 69 y.o.  Subjective:   Patient ID:  Kelly Howard is a 69 y.o. (DOB 1950-12-31) female.  Chief Complaint: No chief complaint on file.   HPI   Accompanied by husband.  [name] presents to the office today for follow-up GAD, MDD, panic attacks, and insomnia.  Describes mood today as "ok". Tearful at times. Mood symptoms - denies depression, anxiety, and irritability today. Stating "I am doing ok today". Had called yesterday needing to talk about her anxiety. Stating "I got better after we talked last night". Reports feeling overwhelmed and panicked at times. Daughter currently in hospital. Gets more anxious when riding in the car - thinking about going places - or seeing friends. Clonazepam does help with the anxiety. Notes she takes a "piece" of a Clonazepam and feels better. Stable interest and motivation. Taking medications as prescribed.  Diagnosed with Parkinson's in 2010.  Energy levels vary. Active, exercising.  Enjoys some usual interests and activities. Married. Lives with husband of 46 years. Has 2 daughters - 3 grandchildren. Mother is a 65 years old. Planning a beach trip.  Spending time with family. Appetite adequate. Weight stable - 131 pounds.  Sleeps better some nights than others. Up and down during the night. Averages 7 hours. Focus and concentration stable. Reading. Completing tasks. Managing aspects of household.   Denies SI or HI. Denies AH or VH. Working with 2 therapists - one for Parkinson's and one for daughter's issues.    Previous medication trials: Clonazepam, Zoloft, Paxil, Celexa    PHQ2-9     Office Visit from 04/11/2019 in [facility] at [facility] Total Score 0  PHQ-9 Total Score 3       Review of Systems:  Review of Systems  Musculoskeletal: Negative for gait problem.  Neurological: Negative for tremors.  Psychiatric/Behavioral:       Please refer to HPI     Medications: I have reviewed the patient's current medications.  Current Outpatient Medications  Medication Sig Dispense Refill  . Acetaminophen (TYLENOL PO) Take 500 mg by mouth as needed.    . AMBULATORY NON FORMULARY MEDICATION U step walker  Dx: G20 1 Device 0  . AMBULATORY NON FORMULARY MEDICATION Abdominal compression binder Dx: G20 1 Device 0  . carbidopa-levodopa (SINEMET CR) 50-200 MG tablet TAKE 1 TABLET BY MOUTH EVERYDAY AT BEDTIME 30 tablet 1  . carbidopa-levodopa (SINEMET IR) 25-100 MG tablet TAKE 1.5 TABLETS BY MOUTH 5 (FIVE) TIMES DAILY. 1.5 TABLET EVERY 3 HRS 450 tablet 1  . cholecalciferol (VITAMIN D3) 25 MCG (1000 UNIT) tablet Take 1,000 Units by mouth daily.    . clonazePAM (KLONOPIN) 0.5 MG tablet Take 1 tablet (0.5 mg total) by mouth 2 (two) times daily. 60 tablet 2  . ibandronate (BONIVA) 150 MG tablet TAKE 1 TABLET(S) EVERY MONTH BY ORAL ROUTE FOR 30 DAYS.    Marland Kitchen levothyroxine (SYNTHROID, LEVOTHROID) 50 MCG tablet Take by mouth.    . MELATONIN PO Take 3 mg by mouth daily.    . rasagiline (AZILECT) 1 MG TABS tablet TAKE 1 TABLET BY MOUTH EVERY DAY 90 tablet 1  . rasagiline (AZILECT) 1 MG TABS tablet Take 1 tablet by mouth daily.    . sertraline (ZOLOFT) 100 MG tablet TAKE ONE TABLET DAILY. 90 tablet 3   No current facility-administered medications for this visit.    Medication Side Effects: None  Allergies:  Allergies  Allergen Reactions  . Codeine Other (See Comments)  GI Upset  . Escitalopram Swelling    Foot swelling  . Propoxyphene Other (See Comments)    Hallucinations    Past Medical History:  Diagnosis Date  . Frequent falls   . Hypothyroidism   . Osteoporosis   . Parkinson's disease (HCC)     Family History  Problem Relation Age of Onset  . Hypercalcemia Mother   . Cancer Paternal Grandmother   . Diabetes Paternal Grandfather     Social History   Socioeconomic History  . Marital status: Married    Spouse name: Not on file  .  Number of children: Not on file  . Years of education: Not on file  . Highest education level: Not on file  Occupational History  . Not on file  Tobacco Use  . Smoking status: Never Smoker  . Smokeless tobacco: Never Used  Vaping Use  . Vaping Use: Never used  Substance and Sexual Activity  . Alcohol use: Yes  . Drug use: Never  . Sexual activity: Not on file  Other Topics Concern  . Not on file  Social History Narrative  . Not on file   Social Determinants of Health   Financial Resource Strain:   . Difficulty of Paying Living Expenses:   Food Insecurity:   . Worried About Programme researcher, broadcasting/film/video in the Last Year:   . Barista in the Last Year:   Transportation Needs:   . Freight forwarder (Medical):   Marland Kitchen Lack of Transportation (Non-Medical):   Physical Activity:   . Days of Exercise per Week:   . Minutes of Exercise per Session:   Stress:   . Feeling of Stress :   Social Connections:   . Frequency of Communication with Friends and Family:   . Frequency of Social Gatherings with Friends and Family:   . Attends Religious Services:   . Active Member of Clubs or Organizations:   . Attends Banker Meetings:   Marland Kitchen Marital Status:   Intimate Partner Violence:   . Fear of Current or Ex-Partner:   . Emotionally Abused:   Marland Kitchen Physically Abused:   . Sexually Abused:     Past Medical History, Surgical history, Social history, and Family history were reviewed and updated as appropriate.   Please see review of systems for further details on the patient's review from today.   Objective:   Physical Exam:  LMP  (LMP Unknown) Comment: gyn  Physical Exam Constitutional:      General: She is not in acute distress. Musculoskeletal:        General: No deformity.  Neurological:     Mental Status: She is alert and oriented to person, place, and time.     Coordination: Coordination normal.  Psychiatric:        Attention and Perception: Attention and  perception normal. She does not perceive auditory or visual hallucinations.        Mood and Affect: Mood normal. Mood is not anxious or depressed. Affect is not labile, blunt, angry or inappropriate.        Speech: Speech normal.        Behavior: Behavior normal.        Thought Content: Thought content normal. Thought content is not paranoid or delusional. Thought content does not include homicidal or suicidal ideation. Thought content does not include homicidal or suicidal plan.        Cognition and Memory: Cognition and memory normal.  Judgment: Judgment normal.     Comments: Insight intact     Lab Review:     Component Value Date/Time   NA 138 07/10/2019 0919   K 3.8 07/10/2019 0919   CL 102 07/10/2019 0919   CO2 29 07/10/2019 0919   GLUCOSE 85 07/10/2019 0919   BUN 18 07/10/2019 0919   CREATININE 0.70 07/10/2019 0919   CALCIUM 9.1 07/10/2019 0919   PROT 6.7 07/10/2019 0919   ALBUMIN 4.3 07/10/2019 0919   AST 14 07/10/2019 0919   ALT 2 07/10/2019 0919   ALKPHOS 80 07/10/2019 0919   BILITOT 0.4 07/10/2019 0919       Component Value Date/Time   WBC 7.2 07/10/2019 0919   RBC 3.80 (L) 07/10/2019 0919   HGB 11.0 (L) 07/10/2019 0919   HCT 32.6 (L) 07/10/2019 0919   PLT 342.0 07/10/2019 0919   MCV 85.8 07/10/2019 0919   MCHC 33.7 07/10/2019 0919   RDW 14.2 07/10/2019 0919   LYMPHSABS 1.0 07/10/2019 0919   MONOABS 0.8 07/10/2019 0919   EOSABS 0.1 07/10/2019 0919   BASOSABS 0.1 07/10/2019 0919    No results found for: POCLITH, LITHIUM   No results found for: PHENYTOIN, PHENOBARB, VALPROATE, CBMZ   .res Assessment: Plan:    Plan:  PDMP reviewed  1. Continue Zoloft 100mg  daily 2. Increase Clonazepam 0.5mg  at hs to bid  RTC 8 weeks  Patient advised to contact office with any questions, adverse effects, or acute worsening in signs and symptoms.  Discussed potential benefits, risk, and side effects of benzodiazepines to include potential risk of tolerance  and dependence, as well as possible drowsiness.  Advised patient not to drive if experiencing drowsiness and to take lowest possible effective dose to minimize risk of dependence and tolerance.    Diagnoses and all orders for this visit:  Major depressive disorder, recurrent episode, moderate (HCC) -     sertraline (ZOLOFT) 100 MG tablet; TAKE ONE TABLET DAILY.  Generalized anxiety disorder -     sertraline (ZOLOFT) 100 MG tablet; TAKE ONE TABLET DAILY. -     clonazePAM (KLONOPIN) 0.5 MG tablet; Take 1 tablet (0.5 mg total) by mouth 2 (two) times daily.  Insomnia, unspecified type -     clonazePAM (KLONOPIN) 0.5 MG tablet; Take 1 tablet (0.5 mg total) by mouth 2 (two) times daily.  Panic attacks -     clonazePAM (KLONOPIN) 0.5 MG tablet; Take 1 tablet (0.5 mg total) by mouth 2 (two) times daily.     Please see After Visit Summary for patient specific instructions.  Future Appointments  Date Time Provider Department Center  12/27/2019 10:00 AM [facility]  12/31/2019  9:30 AM 03/01/2020, Philip Aspen, MD [facility]  03/12/2020  2:40 PM Indea Dearman, 03/14/2020, NP [facility] None  05/12/2020  2:30 PM Tat, 05/14/2020, DO [facility] None    No orders of the defined types were placed in this encounter.   -------------------------------

## 2019-12-19 ENCOUNTER — Other Ambulatory Visit: Payer: Self-pay

## 2019-12-19 DIAGNOSIS — E538 Deficiency of other specified B group vitamins: Secondary | ICD-10-CM

## 2019-12-19 NOTE — Progress Notes (Signed)
Previous order expired for B-12/re-entered for lab visit/thx dmf

## 2019-12-27 ENCOUNTER — Other Ambulatory Visit: Payer: Medicare PPO

## 2019-12-27 DIAGNOSIS — E538 Deficiency of other specified B group vitamins: Secondary | ICD-10-CM | POA: Diagnosis not present

## 2019-12-28 LAB — VITAMIN B12: Vitamin B-12: 366 pg/mL (ref 200–1100)

## 2019-12-31 ENCOUNTER — Encounter: Payer: Self-pay | Admitting: Internal Medicine

## 2019-12-31 ENCOUNTER — Other Ambulatory Visit: Payer: Self-pay

## 2019-12-31 ENCOUNTER — Ambulatory Visit: Payer: Medicare PPO | Admitting: Internal Medicine

## 2019-12-31 VITALS — BP 120/70 | HR 66 | Temp 98.1°F | Wt 126.5 lb

## 2019-12-31 DIAGNOSIS — F411 Generalized anxiety disorder: Secondary | ICD-10-CM

## 2019-12-31 DIAGNOSIS — G20 Parkinson's disease: Secondary | ICD-10-CM | POA: Diagnosis not present

## 2019-12-31 DIAGNOSIS — E039 Hypothyroidism, unspecified: Secondary | ICD-10-CM | POA: Diagnosis not present

## 2019-12-31 DIAGNOSIS — M810 Age-related osteoporosis without current pathological fracture: Secondary | ICD-10-CM

## 2019-12-31 DIAGNOSIS — E559 Vitamin D deficiency, unspecified: Secondary | ICD-10-CM | POA: Diagnosis not present

## 2019-12-31 DIAGNOSIS — E538 Deficiency of other specified B group vitamins: Secondary | ICD-10-CM | POA: Diagnosis not present

## 2019-12-31 MED ORDER — CYANOCOBALAMIN 1000 MCG/ML IJ SOLN
1000.0000 ug | Freq: Once | INTRAMUSCULAR | Status: AC
  Administered 2019-12-31: 1000 ug via INTRAMUSCULAR

## 2019-12-31 NOTE — Addendum Note (Signed)
Addended by: Kern Reap B on: 12/31/2019 01:01 PM   Modules accepted: Orders

## 2019-12-31 NOTE — Progress Notes (Signed)
Established Patient Office Visit     This visit occurred during the SARS-CoV-2 public health emergency.  Safety protocols were in place, including screening questions prior to the visit, additional usage of staff PPE, and extensive cleaning of exam room while observing appropriate contact time as indicated for disinfecting solutions.    CC/Reason for Visit: 22-month follow-up chronic conditions  HPI: Kelly Howard is a 69 y.o. female who is coming in today for the above mentioned reasons. Past Medical History is significant for:  Hypothyroidism, Parkinson's disease followed by neurology and osteoporosis on Boniva.  She has also been diagnosed with vitamin D and B12 deficiency and has been on supplementation.  She also has generalized anxiety disorder/panic attacks, recently saw psychiatry and was prescribed Klonopin 0.5 mg twice daily.  She has not been taking it as prescribed as she does not want to "get addicted to it".  She is having almost daily panic attacks.   Past Medical/Surgical History: Past Medical History:  Diagnosis Date  . Frequent falls   . Hypothyroidism   . Osteoporosis   . Parkinson's disease [facility])     Past Surgical History:  Procedure Laterality Date  . TONSILLECTOMY    . WRIST SURGERY      Social History:  reports that she has never smoked. She has never used smokeless tobacco. She reports current alcohol use. She reports that she does not use drugs.  Allergies: Allergies  Allergen Reactions  . Codeine Other (See Comments)    GI Upset  . Escitalopram Swelling    Foot swelling  . Propoxyphene Other (See Comments)    Hallucinations    Family History:  Family History  Problem Relation Age of Onset  . Hypercalcemia Mother   . Cancer Paternal Grandmother   . Diabetes Paternal Grandfather      Current Outpatient Medications:  .  Acetaminophen (TYLENOL PO), Take 500 mg by mouth as needed., Disp: , Rfl:  .  AMBULATORY NON FORMULARY  MEDICATION, U step walker  Dx: G20, Disp: 1 Device, Rfl: 0 .  AMBULATORY NON FORMULARY MEDICATION, Abdominal compression binder Dx: G20, Disp: 1 Device, Rfl: 0 .  carbidopa-levodopa (SINEMET CR) 50-200 MG tablet, TAKE 1 TABLET BY MOUTH EVERYDAY AT BEDTIME, Disp: 30 tablet, Rfl: 1 .  carbidopa-levodopa (SINEMET IR) 25-100 MG tablet, TAKE 1.5 TABLETS BY MOUTH 5 (FIVE) TIMES DAILY. 1.5 TABLET EVERY 3 HRS, Disp: 450 tablet, Rfl: 1 .  cholecalciferol (VITAMIN D3) 25 MCG (1000 UNIT) tablet, Take 1,000 Units by mouth daily., Disp: , Rfl:  .  clonazePAM (KLONOPIN) 0.5 MG tablet, Take 1 tablet (0.5 mg total) by mouth 2 (two) times daily., Disp: 60 tablet, Rfl: 2 .  ibandronate (BONIVA) 150 MG tablet, TAKE 1 TABLET(S) EVERY MONTH BY ORAL ROUTE FOR 30 DAYS., Disp: , Rfl:  .  levothyroxine (SYNTHROID, LEVOTHROID) 50 MCG tablet, Take by mouth., Disp: , Rfl:  .  MELATONIN PO, Take 3 mg by mouth daily., Disp: , Rfl:  .  rasagiline (AZILECT) 1 MG TABS tablet, TAKE 1 TABLET BY MOUTH EVERY DAY, Disp: 90 tablet, Rfl: 1 .  sertraline (ZOLOFT) 100 MG tablet, TAKE ONE TABLET DAILY., Disp: 90 tablet, Rfl: 3  Review of Systems:  Constitutional: Denies fever, chills, diaphoresis, appetite change and fatigue.  HEENT: Denies photophobia, eye pain, redness, hearing loss, ear pain, congestion, sore throat, rhinorrhea, sneezing, mouth sores, trouble swallowing, neck pain, neck stiffness and tinnitus.   Respiratory: Denies SOB, DOE, cough, chest tightness,  and wheezing.   Cardiovascular: Denies chest pain, palpitations and leg swelling.  Gastrointestinal: Denies nausea, vomiting, abdominal pain, diarrhea, constipation, blood in stool and abdominal distention.  Genitourinary: Denies dysuria, urgency, frequency, hematuria, flank pain and difficulty urinating.  Endocrine: Denies: hot or cold intolerance, sweats, changes in hair or nails, polyuria, polydipsia. Musculoskeletal: Denies myalgias, back pain, joint swelling,  arthralgias and gait problem.  Skin: Denies pallor, rash and wound.  Neurological: Denies dizziness, seizures, syncope, weakness, light-headedness, numbness and headaches.  Hematological: Denies adenopathy. Easy bruising, personal or family bleeding history  Psychiatric/Behavioral: Denies suicidal ideation,  confusion, sleep disturbance and agitation    Physical Exam: Vitals:   12/31/19 0935  BP: 120/70  Pulse: 66  Temp: 98.1 F (36.7 C)  TempSrc: Oral  SpO2: 98%  Weight: 126 lb 8 oz (57.4 kg)    Body mass index is 20.11 kg/m.   Constitutional: NAD, calm, comfortable Eyes: PERRL, lids and conjunctivae normal ENMT: Mucous membranes are moist.  Respiratory: clear to auscultation bilaterally, no wheezing, no crackles. Normal respiratory effort. No accessory muscle use.  Cardiovascular: Regular rate and rhythm, no murmurs / rubs / gallops. No extremity edema. Neurologic: Grossly intact and nonfocal Psychiatric: Normal judgment and insight. Alert and oriented x 3. Normal mood.    Impression and Plan:  Vitamin D deficiency -Had a level of 62 in May, okay to take OTC vitamin D 2000 units daily.  Vitamin B12 deficiency -She is receiving monthly IM injections, requesting an injection today.  Hypothyroidism, unspecified type -Last TSH was 2.140 in February.  Age-related osteoporosis without current pathological fracture -On Boniva.  Parkinson disease (HCC)  GAD (generalized anxiety disorder) -Have advised to take Klonopin as prescribed by psychiatry.   Patient Instructions  -Nice seeing you today!!  -B12 injection today.  -Schedule follow up in 6 months for your physical. Please come in fasting that day.     Chaya Jan, MD  Primary Care at [facility]

## 2019-12-31 NOTE — Patient Instructions (Signed)
-  Nice seeing you today!!  -B12 injection today.  -Schedule follow up in 6 months for your physical. Please come in fasting that day. 

## 2020-01-01 ENCOUNTER — Other Ambulatory Visit: Payer: Self-pay | Admitting: Neurology

## 2020-01-01 NOTE — Telephone Encounter (Signed)
Rx(s) sent to pharmacy electronically.  

## 2020-01-02 DIAGNOSIS — F4323 Adjustment disorder with mixed anxiety and depressed mood: Secondary | ICD-10-CM | POA: Diagnosis not present

## 2020-01-03 ENCOUNTER — Other Ambulatory Visit: Payer: Self-pay | Admitting: Neurology

## 2020-01-06 ENCOUNTER — Other Ambulatory Visit: Payer: Self-pay | Admitting: Neurology

## 2020-01-07 ENCOUNTER — Ambulatory Visit: Payer: Medicare PPO | Admitting: Internal Medicine

## 2020-01-08 ENCOUNTER — Ambulatory Visit: Payer: Medicare PPO | Admitting: Internal Medicine

## 2020-01-14 ENCOUNTER — Ambulatory Visit: Payer: Medicare PPO | Admitting: Adult Health

## 2020-01-14 ENCOUNTER — Telehealth: Payer: Self-pay | Admitting: Adult Health

## 2020-01-14 NOTE — Telephone Encounter (Signed)
Pt is requesting a refill on her Clonazapam. Fill at the [facility] on [address]. Refill will be needed before her appt scheduled for 10/12.

## 2020-01-15 DIAGNOSIS — F4322 Adjustment disorder with anxiety: Secondary | ICD-10-CM | POA: Diagnosis not present

## 2020-01-17 NOTE — Telephone Encounter (Signed)
Patient should have refills on file but will check to confirm.

## 2020-02-10 DIAGNOSIS — H1132 Conjunctival hemorrhage, left eye: Secondary | ICD-10-CM | POA: Diagnosis not present

## 2020-02-12 ENCOUNTER — Telehealth: Payer: Self-pay | Admitting: Adult Health

## 2020-02-12 NOTE — Telephone Encounter (Signed)
Left message for patient to call back to discuss and to ask for Yusra Ravert or Grover Canavan

## 2020-02-12 NOTE — Telephone Encounter (Signed)
Okay I call or do you want to speak with her?

## 2020-02-12 NOTE — Telephone Encounter (Signed)
Pt left a message stating that she wants to talk about her medicine especially klonopin. She doesn't think it is working anymore. Please give her a call at [phone]

## 2020-02-12 NOTE — Telephone Encounter (Signed)
Kelly Howard can you call and get more information please

## 2020-02-12 NOTE — Telephone Encounter (Signed)
Can you get some specifics?

## 2020-02-14 NOTE — Telephone Encounter (Signed)
Tried to reach patient again, phone picked up but they hung up when I asked for [name].

## 2020-02-14 NOTE — Telephone Encounter (Signed)
Tried reaching patient. No answer and VM is full.

## 2020-02-17 DIAGNOSIS — F4323 Adjustment disorder with mixed anxiety and depressed mood: Secondary | ICD-10-CM | POA: Diagnosis not present

## 2020-02-18 NOTE — Telephone Encounter (Signed)
Noted  

## 2020-02-18 NOTE — Telephone Encounter (Signed)
Spoke with patient. She was very anxious on the phone. She states that at times she's okay and other times she feels anxious. She had some questions about the klonopin and I answered them. She sometimes has to take 3 a day. Could you increase this to 3 a day as needed for her? She has only been taking half a tablet at a time every couple hours. I advised her it is okay to take a whole tablet and reassured her that she is on a low dose. I told her not to exceed the recommended dose or she will run out of her medicine. Please advise.

## 2020-02-18 NOTE — Telephone Encounter (Signed)
Left patient a detailed message andd asked her to call back with any questions.

## 2020-02-18 NOTE — Telephone Encounter (Signed)
Ok to increase to 0.5mg  TID until appointment.

## 2020-02-18 NOTE — Telephone Encounter (Signed)
Please review follow up call, asking to increase up to tid?

## 2020-02-18 NOTE — Telephone Encounter (Signed)
When is her next appointment?

## 2020-02-18 NOTE — Telephone Encounter (Signed)
Next apt is 10/12

## 2020-02-19 ENCOUNTER — Other Ambulatory Visit: Payer: Self-pay | Admitting: Neurology

## 2020-03-02 ENCOUNTER — Encounter: Payer: Self-pay | Admitting: Internal Medicine

## 2020-03-05 ENCOUNTER — Other Ambulatory Visit: Payer: Self-pay

## 2020-03-05 ENCOUNTER — Encounter: Payer: Self-pay | Admitting: Adult Health

## 2020-03-05 ENCOUNTER — Ambulatory Visit (INDEPENDENT_AMBULATORY_CARE_PROVIDER_SITE_OTHER): Payer: Medicare PPO | Admitting: Adult Health

## 2020-03-05 DIAGNOSIS — F410 Panic disorder [episodic paroxysmal anxiety] without agoraphobia: Secondary | ICD-10-CM | POA: Diagnosis not present

## 2020-03-05 DIAGNOSIS — F411 Generalized anxiety disorder: Secondary | ICD-10-CM

## 2020-03-05 DIAGNOSIS — G4700 Insomnia, unspecified: Secondary | ICD-10-CM | POA: Diagnosis not present

## 2020-03-05 DIAGNOSIS — F4323 Adjustment disorder with mixed anxiety and depressed mood: Secondary | ICD-10-CM | POA: Diagnosis not present

## 2020-03-05 MED ORDER — CLONAZEPAM 0.5 MG PO TABS: 0.5000 mg | ORAL_TABLET | Freq: Three times a day (TID) | ORAL | 2 refills | Status: DC | PRN

## 2020-03-05 NOTE — Progress Notes (Signed)
Kelly Howard [id number] September 20, 1950 69 y.o.  Subjective:   Patient ID:  Kelly Howard is a 69 y.o. (DOB 05/08/1951) female.  Chief Complaint: No chief complaint on file.   HPI   Accompanied by husband.   Kelly Howard presents to the office today for follow-up of GAD, MDD, panic attacks, and insomnia.  Describes mood today as "ok". Tearful at times. Mood symptoms - denies depression and irritability. Feels anxious at times - has 'triggers". Daughter living with them and doing better. Feels like Clonazepam is helpful. Stating "I feel good today". Stable interest and motivation. Taking medications as prescribed. Diagnosed with Parkinson's in 2010.  Energy levels vary. Active, has a regular exercise routine. Enjoys some usual interests and activities. Married. Lives with husband of 46 years. Has 2 daughters - 3 grandchildren. Mother is a 45 years old. Spending time with family. Appetite adequate. Weight stable - 131 pounds.  Sleeps better some nights than others. Averages 7 hours. Focus and concentration stable. Reading. Completing tasks. Managing aspects of household.   Denies SI or HI. Denies AH or VH. Working with 2 therapists - one for Parkinson's and one for daughter's issues.   Previous medication trials: Clonazepam, Zoloft, Paxil, Celexa  PHQ2-9     Office Visit from 12/31/2019 in [facility] at [employer] from 04/11/2019 in [facility] at [employer] [facility] Total Score 0 0  PHQ-9 Total Score 0 3       Review of Systems:  Review of Systems  Musculoskeletal: Negative for gait problem.  Neurological: Negative for tremors.  Psychiatric/Behavioral:       Please refer to HPI    Medications: I have reviewed the patient's current medications.  Current Outpatient Medications  Medication Sig Dispense Refill  . Acetaminophen (TYLENOL PO) Take 500 mg by mouth as needed.    . AMBULATORY NON FORMULARY MEDICATION U step walker  Dx:  G20 1 Device 0  . AMBULATORY NON FORMULARY MEDICATION Abdominal compression binder Dx: G20 1 Device 0  . carbidopa-levodopa (SINEMET CR) 50-200 MG tablet TAKE 1 TABLET BY MOUTH EVERYDAY AT BEDTIME 30 tablet 1  . carbidopa-levodopa (SINEMET IR) 25-100 MG tablet TAKE 1.5 TABLETS BY MOUTH 5 (FIVE) TIMES DAILY. 1.5 TABLET EVERY 3 HRS 450 tablet 1  . cholecalciferol (VITAMIN D3) 25 MCG (1000 UNIT) tablet Take 1,000 Units by mouth daily.    . clonazePAM (KLONOPIN) 0.5 MG tablet Take 1 tablet (0.5 mg total) by mouth 3 (three) times daily as needed for anxiety. 90 tablet 2  . ibandronate (BONIVA) 150 MG tablet TAKE 1 TABLET(S) EVERY MONTH BY ORAL ROUTE FOR 30 DAYS.    Marland Kitchen levothyroxine (SYNTHROID, LEVOTHROID) 50 MCG tablet Take by mouth.    . MELATONIN PO Take 3 mg by mouth daily.    . rasagiline (AZILECT) 1 MG TABS tablet TAKE 1 TABLET BY MOUTH EVERY DAY 90 tablet 1  . sertraline (ZOLOFT) 100 MG tablet TAKE ONE TABLET DAILY. 90 tablet 3   No current facility-administered medications for this visit.    Medication Side Effects: None  Allergies:  Allergies  Allergen Reactions  . Codeine Other (See Comments)    GI Upset  . Escitalopram Swelling    Foot swelling  . Propoxyphene Other (See Comments)    Hallucinations    Past Medical History:  Diagnosis Date  . Frequent falls   . Hypothyroidism   . Osteoporosis   . Parkinson's disease (HCC)     Family History  Problem  Relation Age of Onset  . Hypercalcemia Mother   . Cancer Paternal Grandmother   . Diabetes Paternal Grandfather     Social History   Socioeconomic History  . Marital status: Married    Spouse name: Not on file  . Number of children: Not on file  . Years of education: Not on file  . Highest education level: Not on file  Occupational History  . Not on file  Tobacco Use  . Smoking status: Never Smoker  . Smokeless tobacco: Never Used  Vaping Use  . Vaping Use: Never used  Substance and Sexual Activity  . Alcohol  use: Yes  . Drug use: Never  . Sexual activity: Not on file  Other Topics Concern  . Not on file  Social History Narrative  . Not on file   Social Determinants of Health   Financial Resource Strain:   . Difficulty of Paying Living Expenses: Not on file  Food Insecurity:   . Worried About Programme researcher, broadcasting/film/video in the Last Year: Not on file  . Ran Out of Food in the Last Year: Not on file  Transportation Needs:   . Lack of Transportation (Medical): Not on file  . Lack of Transportation (Non-Medical): Not on file  Physical Activity:   . Days of Exercise per Week: Not on file  . Minutes of Exercise per Session: Not on file  Stress:   . Feeling of Stress : Not on file  Social Connections:   . Frequency of Communication with Friends and Family: Not on file  . Frequency of Social Gatherings with Friends and Family: Not on file  . Attends Religious Services: Not on file  . Active Member of Clubs or Organizations: Not on file  . Attends Banker Meetings: Not on file  . Marital Status: Not on file  Intimate Partner Violence:   . Fear of Current or Ex-Partner: Not on file  . Emotionally Abused: Not on file  . Physically Abused: Not on file  . Sexually Abused: Not on file    Past Medical History, Surgical history, Social history, and Family history were reviewed and updated as appropriate.   Please see review of systems for further details on the patient's review from today.   Objective:   Physical Exam:  LMP  (LMP Unknown) Comment: gyn  Physical Exam Constitutional:      General: She is not in acute distress. Musculoskeletal:        General: No deformity.  Neurological:     Mental Status: She is alert and oriented to person, place, and time.     Coordination: Coordination normal.  Psychiatric:        Attention and Perception: Attention and perception normal. She does not perceive auditory or visual hallucinations.        Mood and Affect: Mood normal. Mood is  not anxious or depressed. Affect is not labile, blunt, angry or inappropriate.        Speech: Speech normal.        Behavior: Behavior normal.        Thought Content: Thought content normal. Thought content is not paranoid or delusional. Thought content does not include homicidal or suicidal ideation. Thought content does not include homicidal or suicidal plan.        Cognition and Memory: Cognition and memory normal.        Judgment: Judgment normal.     Comments: Insight intact     Lab Review:  Component Value Date/Time   NA 138 07/10/2019 0919   K 3.8 07/10/2019 0919   CL 102 07/10/2019 0919   CO2 29 07/10/2019 0919   GLUCOSE 85 07/10/2019 0919   BUN 18 07/10/2019 0919   CREATININE 0.70 07/10/2019 0919   CALCIUM 9.1 07/10/2019 0919   PROT 6.7 07/10/2019 0919   ALBUMIN 4.3 07/10/2019 0919   AST 14 07/10/2019 0919   ALT 2 07/10/2019 0919   ALKPHOS 80 07/10/2019 0919   BILITOT 0.4 07/10/2019 0919       Component Value Date/Time   WBC 7.2 07/10/2019 0919   RBC 3.80 (L) 07/10/2019 0919   HGB 11.0 (L) 07/10/2019 0919   HCT 32.6 (L) 07/10/2019 0919   PLT 342.0 07/10/2019 0919   MCV 85.8 07/10/2019 0919   MCHC 33.7 07/10/2019 0919   RDW 14.2 07/10/2019 0919   LYMPHSABS 1.0 07/10/2019 0919   MONOABS 0.8 07/10/2019 0919   EOSABS 0.1 07/10/2019 0919   BASOSABS 0.1 07/10/2019 0919    No results found for: POCLITH, LITHIUM   No results found for: PHENYTOIN, PHENOBARB, VALPROATE, CBMZ   .res Assessment: Plan:    Plan:  PDMP reviewed  1. Continue Zoloft 100mg  daily 2. Clonazepam 0.5mg  TID prn anxiety  RTC 8 weeks  Patient advised to contact office with any questions, adverse effects, or acute worsening in signs and symptoms.  Discussed potential benefits, risk, and side effects of benzodiazepines to include potential risk of tolerance and dependence, as well as possible drowsiness.  Advised patient not to drive if experiencing drowsiness and to take lowest  possible effective dose to minimize risk of dependence and tolerance.   Diagnoses and all orders for this visit:  Generalized anxiety disorder -     clonazePAM (KLONOPIN) 0.5 MG tablet; Take 1 tablet (0.5 mg total) by mouth 3 (three) times daily as needed for anxiety.  Insomnia, unspecified type -     clonazePAM (KLONOPIN) 0.5 MG tablet; Take 1 tablet (0.5 mg total) by mouth 3 (three) times daily as needed for anxiety.  Panic attacks -     clonazePAM (KLONOPIN) 0.5 MG tablet; Take 1 tablet (0.5 mg total) by mouth 3 (three) times daily as needed for anxiety.     Please see After Visit Summary for patient specific instructions.  Future Appointments  Date Time Provider Department Center  05/12/2020  2:30 PM Tat, 05/14/2020, DO [facility] None  07/02/2020 10:00 AM 08/30/2020, Philip Aspen, MD [facility]    No orders of the defined types were placed in this encounter.   -------------------------------

## 2020-03-07 DIAGNOSIS — S0993XA Unspecified injury of face, initial encounter: Secondary | ICD-10-CM | POA: Diagnosis not present

## 2020-03-07 DIAGNOSIS — S8991XA Unspecified injury of right lower leg, initial encounter: Secondary | ICD-10-CM | POA: Diagnosis not present

## 2020-03-07 DIAGNOSIS — W010XXA Fall on same level from slipping, tripping and stumbling without subsequent striking against object, initial encounter: Secondary | ICD-10-CM | POA: Diagnosis not present

## 2020-03-07 DIAGNOSIS — S01511A Laceration without foreign body of lip, initial encounter: Secondary | ICD-10-CM | POA: Diagnosis not present

## 2020-03-10 ENCOUNTER — Ambulatory Visit: Payer: Medicare PPO | Admitting: Adult Health

## 2020-03-12 ENCOUNTER — Ambulatory Visit: Payer: Medicare PPO | Admitting: Adult Health

## 2020-03-18 ENCOUNTER — Other Ambulatory Visit: Payer: Self-pay | Admitting: Neurology

## 2020-03-19 ENCOUNTER — Telehealth: Payer: Self-pay

## 2020-03-19 DIAGNOSIS — F4323 Adjustment disorder with mixed anxiety and depressed mood: Secondary | ICD-10-CM | POA: Diagnosis not present

## 2020-03-19 NOTE — Telephone Encounter (Signed)
Spoke with patient who states she is having trouble swallowing for the past few weeks. She states she has a little trouble breathing at night and she has to breathe through her mouth. She states its takes a while to settle down. She states this has been going on for a few weeks. Patient states she does not know what to do; makes her miserable and irritable. She states called the Dr Hardie Shackleton office and was told this could be pneumonia and she should go to the emergency room. Patient declined going to the hospital or calling 911 and stated she does not think her issue is that urgent. She states she is calling because Dr Hardie Shackleton could not get her in today or tomorrow. Patient states she does not want to go to the hospital ad sit with people and all those germs. Patient states she doesn't think the issue is serious just yet. Patient states she is scare to go to sleep. She states she doesn't know if she has a valid complaint or not.   Patient states she doesn't know what to do.   Asked patient to speak with her spouse. " I am on the phone and can not speak with them right now", is what husband stated.   Advised patient that I will speak with Dr Tat and give her a call back.

## 2020-03-19 NOTE — Telephone Encounter (Signed)
If she can't breathe, go to the ED She has c/o swallow trouble for a long time and declined MBE.  If she wants to do it, order MBE.   See last note, husband thinks that this is due to anxiety. F/u pcp

## 2020-03-20 NOTE — Telephone Encounter (Signed)
Spoke with patient and gave her Dr Don Perking recommendations. She voiced understanding. She states she will contact the office if she decides to have the swallow study done.

## 2020-03-31 ENCOUNTER — Telehealth: Payer: Self-pay | Admitting: Internal Medicine

## 2020-03-31 DIAGNOSIS — J329 Chronic sinusitis, unspecified: Secondary | ICD-10-CM

## 2020-03-31 NOTE — Telephone Encounter (Signed)
Patient is needing a referral to [facility]  She is having a lot of phlegm in her throat and needs to be seen by ENT   Please advise

## 2020-04-01 NOTE — Telephone Encounter (Signed)
Sure

## 2020-04-01 NOTE — Telephone Encounter (Signed)
Okay to refer? 

## 2020-04-01 NOTE — Addendum Note (Signed)
Addended by: Kern Reap B on: 04/01/2020 11:55 AM   Modules accepted: Orders

## 2020-04-01 NOTE — Telephone Encounter (Signed)
Referral placed.

## 2020-04-07 ENCOUNTER — Encounter (INDEPENDENT_AMBULATORY_CARE_PROVIDER_SITE_OTHER): Payer: Self-pay | Admitting: Otolaryngology

## 2020-04-07 ENCOUNTER — Other Ambulatory Visit: Payer: Self-pay

## 2020-04-07 ENCOUNTER — Ambulatory Visit (INDEPENDENT_AMBULATORY_CARE_PROVIDER_SITE_OTHER): Payer: Medicare PPO | Admitting: Otolaryngology

## 2020-04-07 VITALS — Temp 97.9°F

## 2020-04-07 DIAGNOSIS — K219 Gastro-esophageal reflux disease without esophagitis: Secondary | ICD-10-CM

## 2020-04-07 DIAGNOSIS — J310 Chronic rhinitis: Secondary | ICD-10-CM | POA: Diagnosis not present

## 2020-04-07 NOTE — Progress Notes (Signed)
HPI: Kelly Howard is a 69 y.o. female who presents is referred by her PCP for evaluation of nasal and swallowing complaints.  She complains of postnasal drainage and a sensation of thick phlegm in her throat that frequently chokes her.  She complains of a chronic "globus" type symptom in the region of her cricoid cartilage low in the throat midline.  She feels like some of her symptoms are coming from her "sinuses".  She has history of Parkinson's disease and presents today with her husband.. Of note she had a CT scan of her head performed in July 2020 that showed clear paranasal sinuses.  Past Medical History:  Diagnosis Date  . Frequent falls   . Hypothyroidism   . Osteoporosis   . Parkinson's disease [facility])    Past Surgical History:  Procedure Laterality Date  . TONSILLECTOMY    . WRIST SURGERY     Social History   Socioeconomic History  . Marital status: Married    Spouse name: Not on file  . Number of children: Not on file  . Years of education: Not on file  . Highest education level: Not on file  Occupational History  . Not on file  Tobacco Use  . Smoking status: Never Smoker  . Smokeless tobacco: Never Used  Vaping Use  . Vaping Use: Never used  Substance and Sexual Activity  . Alcohol use: Yes  . Drug use: Never  . Sexual activity: Not on file  Other Topics Concern  . Not on file  Social History Narrative  . Not on file   Social Determinants of Health   Financial Resource Strain:   . Difficulty of Paying Living Expenses: Not on file  Food Insecurity:   . Worried About Programme researcher, broadcasting/film/video in the Last Year: Not on file  . Ran Out of Food in the Last Year: Not on file  Transportation Needs:   . Lack of Transportation (Medical): Not on file  . Lack of Transportation (Non-Medical): Not on file  Physical Activity:   . Days of Exercise per Week: Not on file  . Minutes of Exercise per Session: Not on file  Stress:   . Feeling of Stress : Not on file   Social Connections:   . Frequency of Communication with Friends and Family: Not on file  . Frequency of Social Gatherings with Friends and Family: Not on file  . Attends Religious Services: Not on file  . Active Member of Clubs or Organizations: Not on file  . Attends Banker Meetings: Not on file  . Marital Status: Not on file   Family History  Problem Relation Age of Onset  . Hypercalcemia Mother   . Cancer Paternal Grandmother   . Diabetes Paternal Grandfather    Allergies  Allergen Reactions  . Codeine Other (See Comments)    GI Upset  . Escitalopram Swelling    Foot swelling  . Propoxyphene Other (See Comments)    Hallucinations   Prior to Admission medications   Medication Sig Start Date End Date Taking? Authorizing Provider  Acetaminophen (TYLENOL PO) Take 500 mg by mouth as needed.   Yes [provider]  AMBULATORY NON FORMULARY MEDICATION U step walker  Dx: G20 06/10/19  Yes Tat, Octaviano Batty, DO  AMBULATORY NON FORMULARY MEDICATION Abdominal compression binder Dx: G20 11/07/19  Yes Tat, Octaviano Batty, DO  carbidopa-levodopa (SINEMET CR) 50-200 MG tablet TAKE 1 TABLET BY MOUTH EVERYDAY AT BEDTIME 03/19/20  Yes Tat,  Octaviano Batty, DO  carbidopa-levodopa (SINEMET IR) 25-100 MG tablet TAKE 1.5 TABLETS BY MOUTH 5 (FIVE) TIMES DAILY. 1.5 TABLET EVERY 3 HRS 01/06/20  Yes Tat, Octaviano Batty, DO  cholecalciferol (VITAMIN D3) 25 MCG (1000 UNIT) tablet Take 1,000 Units by mouth daily.   Yes [provider]  clonazePAM (KLONOPIN) 0.5 MG tablet Take 1 tablet (0.5 mg total) by mouth 3 (three) times daily as needed for anxiety. 03/05/20  Yes Mozingo, Thereasa Solo, NP  ibandronate (BONIVA) 150 MG tablet TAKE 1 TABLET(S) EVERY MONTH BY ORAL ROUTE FOR 30 DAYS. 08/12/19  Yes [provider]  levothyroxine (SYNTHROID, LEVOTHROID) 50 MCG tablet Take by mouth. 03/09/12  Yes [provider]  MELATONIN PO Take 3 mg by mouth daily.   Yes [provider]   rasagiline (AZILECT) 1 MG TABS tablet TAKE 1 TABLET BY MOUTH EVERY DAY 01/01/20  Yes Tat, [name] S, DO  sertraline (ZOLOFT) 100 MG tablet TAKE ONE TABLET DAILY. 12/12/19  Yes Mozingo, Thereasa Solo, NP     Positive ROS: Otherwise negative  All other systems have been reviewed and were otherwise negative with the exception of those mentioned in the HPI and as above.  Physical Exam: Constitutional: Alert, well-appearing, no acute distress Ears: External ears without lesions or tenderness. Ear canals are clear bilaterally with intact, clear TMs.  Nasal: External nose without lesions. Septum deviated slightly to the left.  She had moderate rhinitis.  After decongesting the nose with Afrin nasal endoscopy was performed and this showed clear middle meatus regions bilaterally.  Clear posterior nasal cavity with minimal clear mucus discharge.. Oral: Lips and gums without lesions. Tongue and palate mucosa without lesions. Posterior oropharynx clear.  Patient is status post tonsillectomy. Fiberoptic laryngoscopy was performed to the right nostril.  The nasopharynx was clear.  The base of tongue vallecula and epiglottis were normal.  Vocal cords were clear bilaterally with normal vocal mobility bilaterally.  She had mild edema of the arytenoid mucosa consistent with probable reflux and globus type symptoms but no mucosal abnormalities noted. Neck: No palpable adenopathy or masses.  No palpable thyroid masses.  No supraclavicular adenopathy noted. Respiratory: Breathing comfortably  Skin: No facial/neck lesions or rash noted.  Laryngoscopy  Date/Time: 04/07/2020 5:33 PM Performed by: Drema Halon, MD Authorized by: Drema Halon, MD   Consent:    Consent obtained:  Verbal   Consent given by:  Patient Procedure details:    Indications: direct visualization of the upper aerodigestive tract and sino-nasal symptoms     Medication:  Afrin   Instrument: flexible fiberoptic laryngoscope      Scope location: bilateral nare   Septum:    normal     Deviation: deviated to the left     Severity of deviation: mild   Sinus:    Right middle meatus: normal     Left middle meatus: normal     Right nasopharynx: normal   Mouth:    Oropharynx: normal     Vallecula: normal     Base of tongue: normal     Epiglottis: normal   Throat:    True vocal cords: normal   Comments:     On fiberoptic laryngoscopy the hypopharynx and larynx were clear.  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  But no abnormalities noted.  She likewise had clear nasal passages bilaterally.    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 She also has mild rhinitis but no clinical evidence of sinus infection.  Plan: Recommended use of Nasacort 2 sprays each nostril at night as this will help with nasal congestion and postnasal drainage.  She can also use saline rinses as needed. I also prescribed omeprazole 40 mg daily before dinner for the next month or 2 to see if this helps at all with her globus type symptoms and swallowing complaints.   Narda Bonds, MD   CC:

## 2020-04-08 DIAGNOSIS — F4323 Adjustment disorder with mixed anxiety and depressed mood: Secondary | ICD-10-CM | POA: Diagnosis not present

## 2020-04-10 ENCOUNTER — Other Ambulatory Visit: Payer: Self-pay | Admitting: Adult Health

## 2020-04-10 DIAGNOSIS — F331 Major depressive disorder, recurrent, moderate: Secondary | ICD-10-CM

## 2020-04-10 DIAGNOSIS — F411 Generalized anxiety disorder: Secondary | ICD-10-CM

## 2020-04-14 ENCOUNTER — Other Ambulatory Visit: Payer: Self-pay | Admitting: Neurology

## 2020-05-01 ENCOUNTER — Other Ambulatory Visit (INDEPENDENT_AMBULATORY_CARE_PROVIDER_SITE_OTHER): Payer: Self-pay | Admitting: Otolaryngology

## 2020-05-05 ENCOUNTER — Encounter: Payer: Self-pay | Admitting: Adult Health

## 2020-05-05 ENCOUNTER — Ambulatory Visit (INDEPENDENT_AMBULATORY_CARE_PROVIDER_SITE_OTHER): Payer: Medicare PPO | Admitting: Adult Health

## 2020-05-05 ENCOUNTER — Other Ambulatory Visit: Payer: Self-pay

## 2020-05-05 DIAGNOSIS — F410 Panic disorder [episodic paroxysmal anxiety] without agoraphobia: Secondary | ICD-10-CM | POA: Diagnosis not present

## 2020-05-05 DIAGNOSIS — G4700 Insomnia, unspecified: Secondary | ICD-10-CM

## 2020-05-05 DIAGNOSIS — F331 Major depressive disorder, recurrent, moderate: Secondary | ICD-10-CM | POA: Diagnosis not present

## 2020-05-05 DIAGNOSIS — F411 Generalized anxiety disorder: Secondary | ICD-10-CM | POA: Diagnosis not present

## 2020-05-05 MED ORDER — CLONAZEPAM 0.5 MG PO TABS: 0.5000 mg | ORAL_TABLET | Freq: Three times a day (TID) | ORAL | 2 refills | Status: DC | PRN

## 2020-05-05 NOTE — Progress Notes (Signed)
Kelly Howard [id number] [birth year] 69 y.o.  Subjective:   Patient ID:  Kelly Howard is a 69 y.o. (DOB 1950/12/12) female.  Chief Complaint: No chief complaint on file.   HPI   Accompanied by husband.  Kelly Howard presents to the office today for follow-up of GAD, MDD, panic attacks, and insomnia.  Describes mood today as "ok". Tearful "sometimes".Pleasant. Mood symptoms - denies depression and irritability. Feels anxious "at times". Stating "I'm taking the Clonazepam more often, but as needed". Feels like Clonazepam is helpful. Also stating "I feel better the I did. Feels like the Carba-Dopa has been helpful. Daughter living with them and "going" ok. Stable interest and motivation. Taking medications as prescribed. Diagnosed with Parkinson's in 2010. " Energy levels vary. Active, has a regular exercise routine. Enjoys some usual interests and activities. Married. Lives with husband of 46 years. Has 2 daughters - 3 grandchildren. Mother is a 69 years old. Spending time with family. Appetite adequate. Weight stable - 131 pounds.  Sleeps better some nights than others. Averages 7 hours of broken sleep. Up and down to the bathroom.  Focus and concentration stable. Reading. Completing tasks. Managing aspects of household.   Denies SI or HI.  Denies AH or VH. Working with 2 therapists - one for Parkinson's and one for daughter's issues.   Previous medication trials: Clonazepam, Zoloft, Paxil, Celexa   PHQ2-9     Office Visit from 12/31/2019 in [facility] at [employer] from 04/11/2019 in [facility] at [employer] Total Score 0 0  PHQ-9 Total Score 0 3       Review of Systems:  Review of Systems  Musculoskeletal: Negative for gait problem.  Neurological: Negative for tremors.  Psychiatric/Behavioral:       Please refer to HPI    Medications: I have reviewed the patient's current medications.  Current Outpatient  Medications  Medication Sig Dispense Refill  . Acetaminophen (TYLENOL PO) Take 500 mg by mouth as needed.    . AMBULATORY NON FORMULARY MEDICATION U step walker  Dx: G20 1 Device 0  . AMBULATORY NON FORMULARY MEDICATION Abdominal compression binder Dx: G20 1 Device 0  . carbidopa-levodopa (SINEMET CR) 50-200 MG tablet TAKE 1 TABLET BY MOUTH EVERYDAY AT BEDTIME 30 tablet 1  . carbidopa-levodopa (SINEMET IR) 25-100 MG tablet TAKE 1.5 TABLETS BY MOUTH 5 (FIVE) TIMES DAILY. 1.5 TABLET EVERY 3 HRS 450 tablet 1  . cholecalciferol (VITAMIN D3) 25 MCG (1000 UNIT) tablet Take 1,000 Units by mouth daily.    . clonazePAM (KLONOPIN) 0.5 MG tablet Take 1 tablet (0.5 mg total) by mouth 3 (three) times daily as needed for anxiety. 90 tablet 2  . ibandronate (BONIVA) 150 MG tablet TAKE 1 TABLET(S) EVERY MONTH BY ORAL ROUTE FOR 30 DAYS.    Marland Kitchen levothyroxine (SYNTHROID, LEVOTHROID) 50 MCG tablet Take by mouth.    . MELATONIN PO Take 3 mg by mouth daily.    Marland Kitchen omeprazole (PRILOSEC) 40 MG capsule TAKE 1 CAPSULE BY MOUTH EVERY DAY BEFORE DINNER 30 capsule 1  . rasagiline (AZILECT) 1 MG TABS tablet TAKE 1 TABLET BY MOUTH EVERY DAY 90 tablet 1  . sertraline (ZOLOFT) 100 MG tablet TAKE ONE TABLET DAILY. 90 tablet 3   No current facility-administered medications for this visit.    Medication Side Effects: None  Allergies:  Allergies  Allergen Reactions  . Codeine Other (See Comments)    GI Upset  . Escitalopram Swelling    Foot  swelling  . Propoxyphene Other (See Comments)    Hallucinations    Past Medical History:  Diagnosis Date  . Frequent falls   . Hypothyroidism   . Osteoporosis   . Parkinson's disease (HCC)     Family History  Problem Relation Age of Onset  . Hypercalcemia Mother   . Cancer Paternal Grandmother   . Diabetes Paternal Grandfather     Social History   Socioeconomic History  . Marital status: Married    Spouse name: Not on file  . Number of children: Not on file  .  Years of education: Not on file  . Highest education level: Not on file  Occupational History  . Not on file  Tobacco Use  . Smoking status: Never Smoker  . Smokeless tobacco: Never Used  Vaping Use  . Vaping Use: Never used  Substance and Sexual Activity  . Alcohol use: Yes  . Drug use: Never  . Sexual activity: Not on file  Other Topics Concern  . Not on file  Social History Narrative  . Not on file   Social Determinants of Health   Financial Resource Strain:   . Difficulty of Paying Living Expenses: Not on file  Food Insecurity:   . Worried About Programme researcher, broadcasting/film/video in the Last Year: Not on file  . Ran Out of Food in the Last Year: Not on file  Transportation Needs:   . Lack of Transportation (Medical): Not on file  . Lack of Transportation (Non-Medical): Not on file  Physical Activity:   . Days of Exercise per Week: Not on file  . Minutes of Exercise per Session: Not on file  Stress:   . Feeling of Stress : Not on file  Social Connections:   . Frequency of Communication with Friends and Family: Not on file  . Frequency of Social Gatherings with Friends and Family: Not on file  . Attends Religious Services: Not on file  . Active Member of Clubs or Organizations: Not on file  . Attends Banker Meetings: Not on file  . Marital Status: Not on file  Intimate Partner Violence:   . Fear of Current or Ex-Partner: Not on file  . Emotionally Abused: Not on file  . Physically Abused: Not on file  . Sexually Abused: Not on file    Past Medical History, Surgical history, Social history, and Family history were reviewed and updated as appropriate.   Please see review of systems for further details on the patient's review from today.   Objective:   Physical Exam:  LMP  (LMP Unknown) Comment: gyn  Physical Exam Constitutional:      General: She is not in acute distress. Musculoskeletal:        General: No deformity.  Neurological:     Mental Status: She  is alert and oriented to person, place, and time.     Coordination: Coordination normal.  Psychiatric:        Attention and Perception: Attention and perception normal. She does not perceive auditory or visual hallucinations.        Mood and Affect: Mood normal. Mood is not anxious or depressed. Affect is not labile, blunt, angry or inappropriate.        Speech: Speech normal.        Behavior: Behavior normal.        Thought Content: Thought content normal. Thought content is not paranoid or delusional. Thought content does not include homicidal or suicidal ideation. Thought  content does not include homicidal or suicidal plan.        Cognition and Memory: Cognition and memory normal.        Judgment: Judgment normal.     Comments: Insight intact     Lab Review:     Component Value Date/Time   NA 138 07/10/2019 0919   K 3.8 07/10/2019 0919   CL 102 07/10/2019 0919   CO2 29 07/10/2019 0919   GLUCOSE 85 07/10/2019 0919   BUN 18 07/10/2019 0919   CREATININE 0.70 07/10/2019 0919   CALCIUM 9.1 07/10/2019 0919   PROT 6.7 07/10/2019 0919   ALBUMIN 4.3 07/10/2019 0919   AST 14 07/10/2019 0919   ALT 2 07/10/2019 0919   ALKPHOS 80 07/10/2019 0919   BILITOT 0.4 07/10/2019 0919       Component Value Date/Time   WBC 7.2 07/10/2019 0919   RBC 3.80 (L) 07/10/2019 0919   HGB 11.0 (L) 07/10/2019 0919   HCT 32.6 (L) 07/10/2019 0919   PLT 342.0 07/10/2019 0919   MCV 85.8 07/10/2019 0919   MCHC 33.7 07/10/2019 0919   RDW 14.2 07/10/2019 0919   LYMPHSABS 1.0 07/10/2019 0919   MONOABS 0.8 07/10/2019 0919   EOSABS 0.1 07/10/2019 0919   BASOSABS 0.1 07/10/2019 0919    No results found for: POCLITH, LITHIUM   No results found for: PHENYTOIN, PHENOBARB, VALPROATE, CBMZ   .res Assessment: Plan:    Plan:  PDMP reviewed  1. Continue Zoloft 100mg  daily 2. Continue Clonazepam 0.5mg  TID prn anxiety  RTC 3 months  Patient advised to contact office with any questions, adverse effects,  or acute worsening in signs and symptoms.  Discussed potential benefits, risk, and side effects of benzodiazepines to include potential risk of tolerance and dependence, as well as possible drowsiness.  Advised patient not to drive if experiencing drowsiness and to take lowest possible effective dose to minimize risk of dependence and tolerance.   Diagnoses and all orders for this visit:  Major depressive disorder, recurrent episode, moderate (HCC)  Generalized anxiety disorder -     clonazePAM (KLONOPIN) 0.5 MG tablet; Take 1 tablet (0.5 mg total) by mouth 3 (three) times daily as needed for anxiety.  Insomnia, unspecified type -     clonazePAM (KLONOPIN) 0.5 MG tablet; Take 1 tablet (0.5 mg total) by mouth 3 (three) times daily as needed for anxiety.  Panic attacks -     clonazePAM (KLONOPIN) 0.5 MG tablet; Take 1 tablet (0.5 mg total) by mouth 3 (three) times daily as needed for anxiety.     Please see After Visit Summary for patient specific instructions.  Future Appointments  Date Time Provider Department Center  05/12/2020  2:30 PM Tat, 05/14/2020, DO [facility] None  07/02/2020 10:00 AM 08/30/2020, Philip Aspen, MD [facility]    No orders of the defined types were placed in this encounter.   -------------------------------

## 2020-05-08 ENCOUNTER — Ambulatory Visit: Payer: Medicare PPO | Attending: Critical Care Medicine

## 2020-05-08 DIAGNOSIS — Z23 Encounter for immunization: Secondary | ICD-10-CM

## 2020-05-08 NOTE — Progress Notes (Signed)
Assessment/Plan:   1.  Parkinsons Disease with disease complicated by falls, dyskinesia, compulsive behaviors (much better off pramipexole)  -Continue carbidopa/levodopa 25/100, 1.5 tablets at 6 AM/9 AM/noon/3 PM/6 PM  -Continue carbidopa/levodopa 50/200 at bedtime  -Needs to use walker at all times - discussed this again today and several prior visits.  2.  Significant GAD   -somewhat better but still quite significant  -Follows with psych NP now.  -Discussed with patient that I really would like her not to take clonazepam during the day.  This causes more balance trouble and more cognitive trouble.  Would like her to find her something else to replace this in the day (using up to tid now)  3.  Dysphagia  -Husband thinks that really it is related to her anxiety and becomes an issue as her anxiety increases.  Have offered MBE multiple times and patient has declined.  -She has seen ENT regarding globus sensation and Nasacort and omeprazole were recommended.  I have discussed with the patient several times that many of our patients complain about a phlegm sensation in the throat that is independent of dysphagia, but I have never found any type of medication to help with this. Subjective:   Kelly Howard was seen today in follow up for Parkinsons disease.  My previous records were reviewed prior to todays visit as well as outside records available to me.   Pt and I discussed extensively last visit the importance of using a walker at all times and she states that she isn't doing that.  [place] - got shoe caught on ground and hit chin.  She is following with psychiatry for anxiety and depression.  Last saw the nurse practitioner on December 7.  I reviewed the notes.  Patient called since last visit about swallowing trouble.  Her husband thinks it is related to anxiety.  She has declined swallow study, which she did again when she called.  She did see  ENT November 9 in regards to globus sensation in her throat.  Nasacort was recommended as well as omeprazole.  Going to [facility] for exercise 1 time per week.    Current prescribed movement disorder medications: Carbidopa/levodopa 25/100, 1.5tablet at 6 AM/9 AM/noon/3 PM/6 PM  Carbidopa/levodopa 50/200 CR at bedtime (increased since our last visit) Sertraline 100 mg daily Clonazepam 0.5 mg,  now taking 3 times daily as needed (prescribed by nurse practitioner at psychiatry) (Rasagiline discontinued last visit)  ALLERGIES:   Allergies  Allergen Reactions   Codeine Other (See Comments)    GI Upset   Escitalopram Swelling    Foot swelling   Propoxyphene Other (See Comments)    Hallucinations    CURRENT MEDICATIONS:  Outpatient Encounter Medications as of 05/12/2020  Medication Sig   Acetaminophen (TYLENOL PO) Take 500 mg by mouth as needed.   AMBULATORY NON FORMULARY MEDICATION U step walker  Dx: G20   AMBULATORY NON FORMULARY MEDICATION Abdominal compression binder Dx: G20   carbidopa-levodopa (SINEMET CR) 50-200 MG tablet TAKE 1 TABLET BY MOUTH EVERYDAY AT BEDTIME   carbidopa-levodopa (SINEMET IR) 25-100 MG tablet TAKE 1.5 TABLETS BY MOUTH 5 (FIVE) TIMES DAILY. 1.5 TABLET EVERY 3 HRS   cholecalciferol (VITAMIN D3) 25 MCG (1000 UNIT) tablet Take 1,000 Units by mouth daily.   clonazePAM (KLONOPIN) 0.5 MG tablet Take 1 tablet (0.5 mg total) by mouth 3 (three) times daily as needed for anxiety.   ibandronate (BONIVA) 150 MG tablet  TAKE 1 TABLET(S) EVERY MONTH BY ORAL ROUTE FOR 30 DAYS.   levothyroxine (SYNTHROID, LEVOTHROID) 50 MCG tablet Take by mouth.   MELATONIN PO Take 3 mg by mouth daily.   omeprazole (PRILOSEC) 40 MG capsule TAKE 1 CAPSULE BY MOUTH EVERY DAY BEFORE DINNER   rasagiline (AZILECT) 1 MG TABS tablet TAKE 1 TABLET BY MOUTH EVERY DAY   sertraline (ZOLOFT) 100 MG tablet TAKE ONE TABLET DAILY.   No facility-administered encounter medications on file as of  05/12/2020.    Objective:   PHYSICAL EXAMINATION:    VITALS:  There were no vitals filed for this visit.  GEN:  The patient appears stated age and is in NAD. HEENT:  Normocephalic, atraumatic.  The mucous membranes are moist. The superficial temporal arteries are without ropiness or tenderness. CV:  RRR Lungs:  CTAB Neck/HEME:  There are no carotid bruits bilaterally.  Neurological examination:  Orientation: The patient is alert and oriented x3. Cranial nerves: There is good facial symmetry with mild facial hypomimia. The speech is fluent and clear. Soft palate rises symmetrically and there is no tongue deviation. Hearing is intact to conversational tone. Sensation: Sensation is intact to light touch throughout Motor: Strength is at least antigravity x4.  Movement examination: Tone: There is mild increased tone in the RLE Abnormal movements: there is mild dystonia in the RUE Coordination:  There is mild decremation with RAM's, with finger taps and hand opening and closing on the R Gait and Station: The patient has no difficulty arising out of a deep-seated chair without the use of the hands. The patient's stride length is good.    I have reviewed and interpreted the following labs independently    Chemistry      Component Value Date/Time   NA 138 07/10/2019 0919   K 3.8 07/10/2019 0919   CL 102 07/10/2019 0919   CO2 29 07/10/2019 0919   BUN 18 07/10/2019 0919   CREATININE 0.70 07/10/2019 0919      Component Value Date/Time   CALCIUM 9.1 07/10/2019 0919   ALKPHOS 80 07/10/2019 0919   AST 14 07/10/2019 0919   ALT 2 07/10/2019 0919   BILITOT 0.4 07/10/2019 0919       Lab Results  Component Value Date   WBC 7.2 07/10/2019   HGB 11.0 (L) 07/10/2019   HCT 32.6 (L) 07/10/2019   MCV 85.8 07/10/2019   PLT 342.0 07/10/2019    Lab Results  Component Value Date   TSH 2.14 07/10/2019     Total time spent on today's visit was 30 minutes, including both face-to-face  time and nonface-to-face time.  Time included that spent on review of records (prior notes available to me/labs/imaging if pertinent), discussing treatment and goals, answering patient's questions and coordinating care.  Cc:  Philip Aspen, Limmie Patricia, MD

## 2020-05-08 NOTE — Progress Notes (Signed)
   Covid-19 Vaccination Clinic  Name:  Barrie Sigmund    MRN: [id number] DOB: 07-Jun-1950  05/08/2020  Ms. Romanek was observed post Covid-19 immunization for 15 minutes without incident. She was provided with Vaccine Information Sheet and instruction to access the V-Safe system.   Ms. Facchini was instructed to call 911 with any severe reactions post vaccine: Marland Kitchen Difficulty breathing  . Swelling of face and throat  . A fast heartbeat  . A bad rash all over body  . Dizziness and weakness   Immunizations Administered    Name Date Dose VIS Date Route   Pfizer COVID-19 Vaccine 05/08/2020  2:02 PM 0.3 mL 03/18/2020 Intramuscular   Manufacturer: [employer], [employer]   Lot: [id number]   NDC: [address]-5

## 2020-05-12 ENCOUNTER — Other Ambulatory Visit: Payer: Self-pay

## 2020-05-12 ENCOUNTER — Ambulatory Visit: Payer: Medicare PPO | Admitting: Neurology

## 2020-05-12 ENCOUNTER — Encounter: Payer: Self-pay | Admitting: Neurology

## 2020-05-12 VITALS — BP 134/75 | HR 85 | Ht 67.0 in | Wt 130.0 lb

## 2020-05-12 DIAGNOSIS — G249 Dystonia, unspecified: Secondary | ICD-10-CM | POA: Diagnosis not present

## 2020-05-12 DIAGNOSIS — G20 Parkinson's disease: Secondary | ICD-10-CM | POA: Diagnosis not present

## 2020-05-12 NOTE — Patient Instructions (Signed)
I would like you to talk to your NP about the daytime klonopin.  I don't like it being taken three times per day in my parkinsons patients due to cognitive changes and falls/fall risk.    The physicians and staff at [facility] are committed to providing excellent care. You may receive a survey requesting feedback about your experience at our office. We strive to receive "very good" responses to the survey questions. If you feel that your experience would prevent you from giving the office a "very good " response, please contact our office to try to remedy the situation. We may be reached at [phone]. Thank you for taking the time out of your busy day to complete the survey.

## 2020-05-14 ENCOUNTER — Telehealth: Payer: Self-pay | Admitting: Neurology

## 2020-05-14 ENCOUNTER — Telehealth: Payer: Self-pay | Admitting: Adult Health

## 2020-05-14 DIAGNOSIS — G20 Parkinson's disease: Secondary | ICD-10-CM

## 2020-05-14 NOTE — Telephone Encounter (Signed)
I do not prescribe that medication for her.

## 2020-05-14 NOTE — Telephone Encounter (Signed)
Pt called to report she has taken 2 doses of Carbidopa 25-100 with in and hour apart 12:00 and 1:00. Please advise on this issue @ [phone]

## 2020-05-14 NOTE — Telephone Encounter (Signed)
1. Patient called and said she mistakenly took two doses of her carbidopa-levodopa 25-100 MG back to back today; once at 12:00 PM and once again at 1:00 PM. Patient wants to know what to do about this?  2. Patient requests a referral for water aerobics PT.

## 2020-05-14 NOTE — Telephone Encounter (Signed)
Taking the meds as she did will be okay Okay for the water aerobics.  You will have to send referral to neurorehab and they take it from there.  Tell her best to ask when in the office (she was in yesterday).  If she can remember to write questions down so she can remember when she is in here, it will help her.

## 2020-05-14 NOTE — Telephone Encounter (Signed)
Spoke with patient and gave her Dr Don Perking recommendations. She voiced understanding.   Referral placed for water aerobics PT.

## 2020-05-15 ENCOUNTER — Telehealth: Payer: Self-pay | Admitting: Adult Health

## 2020-05-15 NOTE — Telephone Encounter (Signed)
Kelly Howard called back about her call from yesterday. She knows you don't prescribe the carbidopa but she just had a visit with Dr. Arbutus Leas and Dr. Arbutus Leas is concerned about her clonazepam affecting her balance and alertness.  Kelly Howard doesn't know if it the dose and if she is concerned about interaction of the clonazepam and her other medications.  She would really like for you to reach out to Dr. Arbutus Leas to discuss the concerns and then let [name] know what she should do.

## 2020-05-15 NOTE — Telephone Encounter (Signed)
Called patient - LVM for her to return call.

## 2020-05-18 ENCOUNTER — Other Ambulatory Visit: Payer: Self-pay | Admitting: Neurology

## 2020-05-20 ENCOUNTER — Other Ambulatory Visit: Payer: Self-pay

## 2020-05-20 ENCOUNTER — Ambulatory Visit: Payer: Medicare PPO | Admitting: Family Medicine

## 2020-05-20 ENCOUNTER — Encounter: Payer: Self-pay | Admitting: Family Medicine

## 2020-05-20 VITALS — BP 100/64 | HR 66 | Temp 98.1°F | Ht 67.0 in | Wt 129.8 lb

## 2020-05-20 DIAGNOSIS — R296 Repeated falls: Secondary | ICD-10-CM | POA: Diagnosis not present

## 2020-05-20 NOTE — Progress Notes (Signed)
Established Patient Office Visit  Subjective:  Patient ID: Kelly Howard, female    DOB: 1950/12/09  Age: 69 y.o. MRN: [id number]  CC:  Chief Complaint  Patient presents with  . Fall    Patient had a fall few days ago, follow up from fall no new pains.     HPI Kelly Howard presents for evaluation after recent fall which she thinks occurred Friday evening.  She was trying to move a chair from one room to another and lost her balance and fell backward.  She fell into a wall but she felt that may have eased her fall somewhat.  She thinks she hit the back of her head but there was no loss of consciousness.  Her back fell into the wall.  She had no headaches or confusion.  She has had at least 4 falls this year and had complicated wrist fractures right and left earlier this year that required surgery.  She has Parkinson's disease and very high risk of falls from that.  She had previously taken Klonopin but fortunately is not taking that currently.  She stays fairly active.  She has walker but does not use that consistently.  She plans to start aquatic exercises soon.  She has had fairly extensive physical therapy during the past year.  She had a little to soreness in her hips but no specific bony tenderness.  Ambulating without difficulty.  No upper extremity pain.  No cervical neck pain.  She does have little bit of trapezius muscle soreness bilaterally  Past Medical History:  Diagnosis Date  . Frequent falls   . Hypothyroidism   . Osteoporosis   . Parkinson's disease [facility])     Past Surgical History:  Procedure Laterality Date  . TONSILLECTOMY    . WRIST SURGERY      Family History  Problem Relation Age of Onset  . Hypercalcemia Mother   . Cancer Paternal Grandmother   . Diabetes Paternal Grandfather     Social History   Socioeconomic History  . Marital status: Married    Spouse name: Not on file  . Number of children: Not on file  . Years of education: Not  on file  . Highest education level: Not on file  Occupational History  . Not on file  Tobacco Use  . Smoking status: Never Smoker  . Smokeless tobacco: Never Used  Vaping Use  . Vaping Use: Never used  Substance and Sexual Activity  . Alcohol use: Yes  . Drug use: Never  . Sexual activity: Not on file  Other Topics Concern  . Not on file  Social History Narrative  . Not on file   Social Determinants of Health   Financial Resource Strain: Not on file  Food Insecurity: Not on file  Transportation Needs: Not on file  Physical Activity: Not on file  Stress: Not on file  Social Connections: Not on file  Intimate Partner Violence: Not on file    Outpatient Medications Prior to Visit  Medication Sig Dispense Refill  . Acetaminophen (TYLENOL PO) Take 500 mg by mouth as needed.    . AMBULATORY NON FORMULARY MEDICATION U step walker  Dx: G20 1 Device 0  . AMBULATORY NON FORMULARY MEDICATION Abdominal compression binder Dx: G20 1 Device 0  . carbidopa-levodopa (SINEMET CR) 50-200 MG tablet TAKE 1 TABLET BY MOUTH EVERYDAY AT BEDTIME 30 tablet 1  . carbidopa-levodopa (SINEMET IR) 25-100 MG tablet TAKE 1.5 TABLETS BY MOUTH 5 (FIVE)  TIMES DAILY. 1.5 TABLET EVERY 3 HRS 675 tablet 1  . cholecalciferol (VITAMIN D3) 25 MCG (1000 UNIT) tablet Take 1,000 Units by mouth daily.    . clonazePAM (KLONOPIN) 0.5 MG tablet Take 1 tablet (0.5 mg total) by mouth 3 (three) times daily as needed for anxiety. 90 tablet 2  . ibandronate (BONIVA) 150 MG tablet Take 150 mg by mouth every 30 (thirty) days.    Marland Kitchen levothyroxine (SYNTHROID, LEVOTHROID) 50 MCG tablet Take 50 mcg by mouth daily before breakfast.    . omeprazole (PRILOSEC) 40 MG capsule TAKE 1 CAPSULE BY MOUTH EVERY DAY BEFORE DINNER 30 capsule 1  . sertraline (ZOLOFT) 100 MG tablet TAKE ONE TABLET DAILY. 90 tablet 3  . MELATONIN PO Take 3 mg by mouth daily. (Patient not taking: Reported on 05/20/2020)    . rasagiline (AZILECT) 1 MG TABS tablet  TAKE 1 TABLET BY MOUTH EVERY DAY (Patient not taking: No sig reported) 90 tablet 1   No facility-administered medications prior to visit.    Allergies  Allergen Reactions  . Codeine Other (See Comments)    GI Upset  . Escitalopram Swelling    Foot swelling  . Propoxyphene Other (See Comments)    Hallucinations    ROS Review of Systems  Respiratory: Negative for shortness of breath.   Cardiovascular: Negative for chest pain.  Musculoskeletal: Negative for back pain and neck pain.  Neurological: Negative for dizziness and headaches.  Psychiatric/Behavioral: Negative for confusion.      Objective:    Physical Exam Vitals reviewed.  Constitutional:      Appearance: Normal appearance.  HENT:     Head: Normocephalic and atraumatic.  Cardiovascular:     Rate and Rhythm: Normal rate and regular rhythm.  Pulmonary:     Effort: Pulmonary effort is normal.     Breath sounds: Normal breath sounds.  Musculoskeletal:     Comments: No upper extremity bony tenderness.  She has full range of motion both hips.  No spinal tenderness over the thoracic, cervical, or lumbar spine  Neurological:     General: No focal deficit present.     Mental Status: She is alert.     Cranial Nerves: No cranial nerve deficit.     BP 100/64   Pulse 66   Temp 98.1 F (36.7 C) (Oral)   Ht 5\' 7"  (1.702 m)   Wt 129 lb 12.8 oz (58.9 kg)   LMP  (LMP Unknown) Comment: gyn  SpO2 97%   BMI 20.33 kg/m  Wt Readings from Last 3 Encounters:  05/20/20 129 lb 12.8 oz (58.9 kg)  05/12/20 130 lb (59 kg)  12/31/19 126 lb 8 oz (57.4 kg)     Health Maintenance Due  Topic Date Due  . Hepatitis C Screening  Never done  . MAMMOGRAM  10/29/2019    There are no preventive care reminders to display for this patient.  Lab Results  Component Value Date   TSH 2.14 07/10/2019   Lab Results  Component Value Date   WBC 7.2 07/10/2019   HGB 11.0 (L) 07/10/2019   HCT 32.6 (L) 07/10/2019   MCV 85.8 07/10/2019    PLT 342.0 07/10/2019   Lab Results  Component Value Date   NA 138 07/10/2019   K 3.8 07/10/2019   CO2 29 07/10/2019   GLUCOSE 85 07/10/2019   BUN 18 07/10/2019   CREATININE 0.70 07/10/2019   BILITOT 0.4 07/10/2019   ALKPHOS 80 07/10/2019   AST 14 07/10/2019  ALT 2 07/10/2019   PROT 6.7 07/10/2019   ALBUMIN 4.3 07/10/2019   CALCIUM 9.1 07/10/2019   GFR 82.84 07/10/2019   Lab Results  Component Value Date   CHOL 198 07/10/2019   Lab Results  Component Value Date   HDL 69.50 07/10/2019   Lab Results  Component Value Date   LDLCALC 115 (H) 07/10/2019   Lab Results  Component Value Date   TRIG 68.0 07/10/2019   Lab Results  Component Value Date   CHOLHDL 3 07/10/2019   Lab Results  Component Value Date   HGBA1C 5.7 07/10/2019      Assessment & Plan:   Frequent falls related to her Parkinson's disease.  She had some general soreness from recent fall but no specific bony tenderness.  -We did not see any indication for specific imaging at this time -Handout given on fall prevention -Reiterated importance of avoiding benzodiazepines if possible to reduce further risk of falls -She is encouraged to consider walker use more frequently -We did discuss possible further physical therapy for fall prevention but she had fairly extensive therapy already  No orders of the defined types were placed in this encounter.   Follow-up: No follow-ups on file.    Evelena Peat, MD

## 2020-05-20 NOTE — Patient Instructions (Signed)

## 2020-05-25 ENCOUNTER — Other Ambulatory Visit (INDEPENDENT_AMBULATORY_CARE_PROVIDER_SITE_OTHER): Payer: Self-pay | Admitting: Otolaryngology

## 2020-06-08 ENCOUNTER — Telehealth: Payer: Self-pay | Admitting: Adult Health

## 2020-06-08 ENCOUNTER — Other Ambulatory Visit: Payer: Self-pay | Admitting: Adult Health

## 2020-06-08 DIAGNOSIS — F331 Major depressive disorder, recurrent, moderate: Secondary | ICD-10-CM

## 2020-06-08 DIAGNOSIS — F411 Generalized anxiety disorder: Secondary | ICD-10-CM

## 2020-06-08 NOTE — Telephone Encounter (Signed)
It was sent on 12/12/19 - #90, 3 RF to expire [facility] 12/11/2020.

## 2020-06-08 NOTE — Telephone Encounter (Signed)
Pt would like to know why her rx for Sertraline was refused. Pt said that she only has 2 day supply left. Please call.

## 2020-06-09 ENCOUNTER — Other Ambulatory Visit (INDEPENDENT_AMBULATORY_CARE_PROVIDER_SITE_OTHER): Payer: Self-pay | Admitting: Otolaryngology

## 2020-06-10 ENCOUNTER — Telehealth: Payer: Self-pay | Admitting: Adult Health

## 2020-06-10 NOTE — Telephone Encounter (Signed)
Saw her pcp recently and the dr. Had a question about the zoloft and she wants to know why she is taking it. Please give her a call at [phone]

## 2020-06-10 NOTE — Telephone Encounter (Signed)
Noted  

## 2020-06-27 DIAGNOSIS — F4323 Adjustment disorder with mixed anxiety and depressed mood: Secondary | ICD-10-CM | POA: Diagnosis not present

## 2020-07-02 ENCOUNTER — Encounter: Payer: Medicare PPO | Admitting: Internal Medicine

## 2020-07-03 ENCOUNTER — Other Ambulatory Visit: Payer: Self-pay | Admitting: Neurology

## 2020-07-03 NOTE — Telephone Encounter (Signed)
Rx(s) sent to pharmacy electronically.  

## 2020-07-06 ENCOUNTER — Other Ambulatory Visit: Payer: Self-pay | Admitting: Adult Health

## 2020-07-06 ENCOUNTER — Telehealth: Payer: Self-pay | Admitting: Neurology

## 2020-07-06 DIAGNOSIS — F331 Major depressive disorder, recurrent, moderate: Secondary | ICD-10-CM

## 2020-07-06 DIAGNOSIS — F411 Generalized anxiety disorder: Secondary | ICD-10-CM

## 2020-07-06 NOTE — Telephone Encounter (Signed)
Spoke with patient who states she is calling to see what Dr Tat wants her to do about her Klonopin.  She states Dr Tat was supposed to call her back about her Klonopin. She states she has questions about how to decrease her medication and other questions about the medication(what new med should she start).  Informed patient that she will need to contact the provider that prescribes the medication and ask that provider all her questions. She then asked me to transfer her to that provider. I advised her that she would need to contact the provider that prescribes the medication. Then she asked me to give her the name and number of her provider. I told her I did not know the providers name but I could look in her chart and see if I see a name. Advised her to contact Dr Dicky Doe office.   Dr Tat made aware.

## 2020-07-06 NOTE — Telephone Encounter (Signed)
Patient called in wanting to confirm her medications. She thinks her Klonopin was supposed to be stopped. She is still taking it, but is supposed to be reducing the dosage. She thought Dr. Arbutus Leas wanted her to stop it because it could cause her to fall. She is not sure what she is supposed to be taking instead?

## 2020-07-24 DIAGNOSIS — F4323 Adjustment disorder with mixed anxiety and depressed mood: Secondary | ICD-10-CM | POA: Diagnosis not present

## 2020-08-04 ENCOUNTER — Ambulatory Visit (INDEPENDENT_AMBULATORY_CARE_PROVIDER_SITE_OTHER): Payer: Medicare PPO | Admitting: Adult Health

## 2020-08-04 ENCOUNTER — Other Ambulatory Visit: Payer: Self-pay

## 2020-08-04 ENCOUNTER — Encounter: Payer: Self-pay | Admitting: Adult Health

## 2020-08-04 DIAGNOSIS — G4700 Insomnia, unspecified: Secondary | ICD-10-CM | POA: Diagnosis not present

## 2020-08-04 DIAGNOSIS — F331 Major depressive disorder, recurrent, moderate: Secondary | ICD-10-CM | POA: Diagnosis not present

## 2020-08-04 DIAGNOSIS — F411 Generalized anxiety disorder: Secondary | ICD-10-CM

## 2020-08-04 DIAGNOSIS — F410 Panic disorder [episodic paroxysmal anxiety] without agoraphobia: Secondary | ICD-10-CM | POA: Diagnosis not present

## 2020-08-04 NOTE — Progress Notes (Signed)
Kelly Howard [id number] 13-Apr-1951 70 y.o.  Subjective:   Patient ID:  Kelly Howard is a 70 y.o. (DOB November 23, 1950) female.  Chief Complaint: No chief complaint on file.   HPI Kelly Howard presents to the office today for follow-up of  GAD, MDD, panic attacks, and insomnia.  Accompanied by husband.  Describes mood today as "ok". Tearful at times.Pleasant. Mood symptoms - denies depression and irritability. Feels anxious throughout the day. Husband notes her anxiety has been "very bad". Daughter living with she and husband - "stressful". Plans to taper off Clonazepam - concerned about falls and cognitive issues. Neurologist suggested she discontinue. Stable interest and motivation. Taking medications as prescribed. Diagnosed with Parkinson's in 2010.  Energy levels vary. Active, has a regular exercise routine. Enjoys some usual interests and activities. Married. Lives with husband of 46 years. Has 2 daughters - 3 grandchildren. Mother is a 60 years old - visited recently. Spending time with family. Appetite adequate. Weight stable - 131 pounds.  Sleeps better some nights than others. Averages 4 hours, then up and down to the bathroom. Focus and concentration difficulties. Reading. Completing tasks. Managing aspects of household.   Denies SI or HI.  Denies AH or VH. Working with 2 therapists - one for Parkinson's and one for daughter's issues.   Previous medication trials: Clonazepam, Zoloft, Paxil, Celexa    PHQ2-9   Flowsheet Row Office Visit from 05/20/2020 in [facility] from 12/31/2019 in [facility] from 04/11/2019 in [facility] Total Score 1 0 0  PHQ-9 Total Score - 0 3       Review of Systems:  Review of Systems  Musculoskeletal: Negative for gait problem.  Neurological: Negative for tremors.  Psychiatric/Behavioral:       Please refer to HPI     Medications: I have reviewed the patient's current medications.  Current Outpatient Medications  Medication Sig Dispense Refill  . Acetaminophen (TYLENOL PO) Take 500 mg by mouth as needed.    . AMBULATORY NON FORMULARY MEDICATION U step walker  Dx: G20 1 Device 0  . AMBULATORY NON FORMULARY MEDICATION Abdominal compression binder Dx: G20 1 Device 0  . carbidopa-levodopa (SINEMET CR) 50-200 MG tablet TAKE 1 TABLET BY MOUTH EVERYDAY AT BEDTIME 30 tablet 3  . carbidopa-levodopa (SINEMET IR) 25-100 MG tablet TAKE 1.5 TABLETS BY MOUTH 5 (FIVE) TIMES DAILY. 1.5 TABLET EVERY 3 HRS 675 tablet 1  . cholecalciferol (VITAMIN D3) 25 MCG (1000 UNIT) tablet Take 1,000 Units by mouth daily.    . clonazePAM (KLONOPIN) 0.5 MG tablet Take 1 tablet (0.5 mg total) by mouth 3 (three) times daily as needed for anxiety. 90 tablet 2  . ibandronate (BONIVA) 150 MG tablet Take 150 mg by mouth every 30 (thirty) days.    Marland Kitchen levothyroxine (SYNTHROID, LEVOTHROID) 50 MCG tablet Take 50 mcg by mouth daily before breakfast.    . MELATONIN PO Take 3 mg by mouth daily. (Patient not taking: Reported on 05/20/2020)    . omeprazole (PRILOSEC) 40 MG capsule TAKE 1 CAPSULE BY MOUTH EVERY DAY BEFORE DINNER 90 capsule 1  . rasagiline (AZILECT) 1 MG TABS tablet TAKE 1 TABLET BY MOUTH EVERY DAY (Patient not taking: No sig reported) 90 tablet 1  . sertraline (ZOLOFT) 100 MG tablet TAKE ONE TABLET DAILY. 90 tablet 3   No current facility-administered medications for this visit.    Medication Side Effects: None  Allergies:  Allergies  Allergen Reactions  . Codeine Other (See Comments)    GI Upset  . Escitalopram Swelling    Foot swelling  . Propoxyphene Other (See Comments)    Hallucinations    Past Medical History:  Diagnosis Date  . Frequent falls   . Hypothyroidism   . Osteoporosis   . Parkinson's disease (HCC)     Family History  Problem Relation Age of Onset  . Hypercalcemia Mother   . Cancer Paternal  Grandmother   . Diabetes Paternal Grandfather     Social History   Socioeconomic History  . Marital status: Married    Spouse name: Not on file  . Number of children: Not on file  . Years of education: Not on file  . Highest education level: Not on file  Occupational History  . Not on file  Tobacco Use  . Smoking status: Never Smoker  . Smokeless tobacco: Never Used  Vaping Use  . Vaping Use: Never used  Substance and Sexual Activity  . Alcohol use: Yes  . Drug use: Never  . Sexual activity: Not on file  Other Topics Concern  . Not on file  Social History Narrative  . Not on file   Social Determinants of Health   Financial Resource Strain: Not on file  Food Insecurity: Not on file  Transportation Needs: Not on file  Physical Activity: Not on file  Stress: Not on file  Social Connections: Not on file  Intimate Partner Violence: Not on file    Past Medical History, Surgical history, Social history, and Family history were reviewed and updated as appropriate.   Please see review of systems for further details on the patient's review from today.   Objective:   Physical Exam:  LMP  (LMP Unknown) Comment: gyn  Physical Exam Constitutional:      General: She is not in acute distress. Musculoskeletal:        General: No deformity.  Neurological:     Mental Status: She is alert and oriented to person, place, and time.     Coordination: Coordination normal.  Psychiatric:        Attention and Perception: Attention and perception normal. She does not perceive auditory or visual hallucinations.        Mood and Affect: Mood normal. Mood is not anxious or depressed. Affect is not labile, blunt, angry or inappropriate.        Speech: Speech normal.        Behavior: Behavior normal.        Thought Content: Thought content normal. Thought content is not paranoid or delusional. Thought content does not include homicidal or suicidal ideation. Thought content does not include  homicidal or suicidal plan.        Cognition and Memory: Cognition and memory normal.        Judgment: Judgment normal.     Comments: Insight intact     Lab Review:     Component Value Date/Time   NA 138 07/10/2019 0919   K 3.8 07/10/2019 0919   CL 102 07/10/2019 0919   CO2 29 07/10/2019 0919   GLUCOSE 85 07/10/2019 0919   BUN 18 07/10/2019 0919   CREATININE 0.70 07/10/2019 0919   CALCIUM 9.1 07/10/2019 0919   PROT 6.7 07/10/2019 0919   ALBUMIN 4.3 07/10/2019 0919   AST 14 07/10/2019 0919   ALT 2 07/10/2019 0919   ALKPHOS 80 07/10/2019 0919   BILITOT 0.4 07/10/2019 0919  Component Value Date/Time   WBC 7.2 07/10/2019 0919   RBC 3.80 (L) 07/10/2019 0919   HGB 11.0 (L) 07/10/2019 0919   HCT 32.6 (L) 07/10/2019 0919   PLT 342.0 07/10/2019 0919   MCV 85.8 07/10/2019 0919   MCHC 33.7 07/10/2019 0919   RDW 14.2 07/10/2019 0919   LYMPHSABS 1.0 07/10/2019 0919   MONOABS 0.8 07/10/2019 0919   EOSABS 0.1 07/10/2019 0919   BASOSABS 0.1 07/10/2019 0919    No results found for: POCLITH, LITHIUM   No results found for: PHENYTOIN, PHENOBARB, VALPROATE, CBMZ   .res Assessment: Plan:    Plan:  PDMP reviewed  1. May increase Zoloft 100mg  to 150mg  daily 2. Discontinue Clonazepam 0.5mg  - will do a 4 week taper  RTC 3 months  Patient advised to contact office with any questions, adverse effects, or acute worsening in signs and symptoms.  Discussed potential benefits, risk, and side effects of benzodiazepines to include potential risk of tolerance and dependence, as well as possible drowsiness.  Advised patient not to drive if experiencing drowsiness and to take lowest possible effective dose to minimize risk of dependence and tolerance.    Diagnoses and all orders for this visit:  Major depressive disorder, recurrent episode, moderate (HCC)  Panic attacks  Insomnia, unspecified type  Generalized anxiety disorder     Please see After Visit Summary for  patient specific instructions.  Future Appointments  Date Time Provider Department Center  08/05/2020 10:00 AM , 10/05/2020, MD [facility]  09/03/2020 12:00 PM [place], [facility], NP [facility] None  11/12/2020  1:30 PM Tat, Thereasa Solo, DO [facility] None    No orders of the defined types were placed in this encounter.   -------------------------------

## 2020-08-05 ENCOUNTER — Ambulatory Visit (INDEPENDENT_AMBULATORY_CARE_PROVIDER_SITE_OTHER): Payer: Medicare PPO | Admitting: Internal Medicine

## 2020-08-05 ENCOUNTER — Encounter: Payer: Self-pay | Admitting: Internal Medicine

## 2020-08-05 VITALS — BP 120/80 | HR 63 | Temp 98.1°F | Ht 65.5 in | Wt 131.0 lb

## 2020-08-05 DIAGNOSIS — Z1231 Encounter for screening mammogram for malignant neoplasm of breast: Secondary | ICD-10-CM

## 2020-08-05 DIAGNOSIS — R296 Repeated falls: Secondary | ICD-10-CM | POA: Diagnosis not present

## 2020-08-05 DIAGNOSIS — Z13820 Encounter for screening for osteoporosis: Secondary | ICD-10-CM

## 2020-08-05 DIAGNOSIS — E039 Hypothyroidism, unspecified: Secondary | ICD-10-CM

## 2020-08-05 DIAGNOSIS — Z0000 Encounter for general adult medical examination without abnormal findings: Secondary | ICD-10-CM

## 2020-08-05 DIAGNOSIS — E559 Vitamin D deficiency, unspecified: Secondary | ICD-10-CM

## 2020-08-05 DIAGNOSIS — M810 Age-related osteoporosis without current pathological fracture: Secondary | ICD-10-CM

## 2020-08-05 DIAGNOSIS — E538 Deficiency of other specified B group vitamins: Secondary | ICD-10-CM | POA: Diagnosis not present

## 2020-08-05 DIAGNOSIS — G20 Parkinson's disease: Secondary | ICD-10-CM

## 2020-08-05 LAB — LIPID PANEL
Cholesterol: 191 mg/dL (ref 0–200)
HDL: 70 mg/dL (ref >39.00)
LDL Cholesterol: 104 mg/dL — ABNORMAL HIGH (ref 0–99)
NonHDL: 120.76
Total CHOL/HDL Ratio: 3
Triglycerides: 83 mg/dL (ref 0.0–149.0)
VLDL: 16.6 mg/dL (ref 0.0–40.0)

## 2020-08-05 LAB — CBC WITH DIFFERENTIAL/PLATELET
Basophils Absolute: 0 10*3/uL (ref 0.0–0.1)
Basophils Relative: 0.9 % (ref 0.0–3.0)
Eosinophils Absolute: 0 10*3/uL (ref 0.0–0.7)
Eosinophils Relative: 1.1 % (ref 0.0–5.0)
HCT: 34.2 % — ABNORMAL LOW (ref 36.0–46.0)
Hemoglobin: 11.5 g/dL — ABNORMAL LOW (ref 12.0–15.0)
Lymphocytes Relative: 17 % (ref 12.0–46.0)
Lymphs Abs: 0.8 10*3/uL (ref 0.7–4.0)
MCHC: 33.5 g/dL (ref 30.0–36.0)
MCV: 87.1 fl (ref 78.0–100.0)
Monocytes Absolute: 0.5 10*3/uL (ref 0.1–1.0)
Monocytes Relative: 10.7 % (ref 3.0–12.0)
Neutro Abs: 3.1 10*3/uL (ref 1.4–7.7)
Neutrophils Relative %: 70.3 % (ref 43.0–77.0)
Platelets: 257 10*3/uL (ref 150.0–400.0)
RBC: 3.93 Mil/uL (ref 3.87–5.11)
RDW: 13.6 % (ref 11.5–15.5)
WBC: 4.4 10*3/uL (ref 4.0–10.5)

## 2020-08-05 LAB — COMPREHENSIVE METABOLIC PANEL
ALT: 2 U/L (ref 0–35)
AST: 13 U/L (ref 0–37)
Albumin: 4.5 g/dL (ref 3.5–5.2)
Alkaline Phosphatase: 59 U/L (ref 39–117)
BUN: 13 mg/dL (ref 6–23)
CO2: 31 mEq/L (ref 19–32)
Calcium: 9.3 mg/dL (ref 8.4–10.5)
Chloride: 102 mEq/L (ref 96–112)
Creatinine, Ser: 0.65 mg/dL (ref 0.40–1.20)
GFR: 89.09 mL/min (ref >60.00)
Glucose, Bld: 95 mg/dL (ref 70–99)
Potassium: 3.9 mEq/L (ref 3.5–5.1)
Sodium: 140 mEq/L (ref 135–145)
Total Bilirubin: 0.7 mg/dL (ref 0.2–1.2)
Total Protein: 7 g/dL (ref 6.0–8.3)

## 2020-08-05 LAB — HEMOGLOBIN A1C: Hgb A1c MFr Bld: 5.8 % (ref 4.6–6.5)

## 2020-08-05 LAB — TSH: TSH: 0.98 u[IU]/mL (ref 0.35–4.50)

## 2020-08-05 LAB — VITAMIN B12: Vitamin B-12: 191 pg/mL — ABNORMAL LOW (ref 211–911)

## 2020-08-05 LAB — VITAMIN D 25 HYDROXY (VIT D DEFICIENCY, FRACTURES): VITD: 26.63 ng/mL — ABNORMAL LOW (ref 30.00–100.00)

## 2020-08-05 NOTE — Patient Instructions (Signed)
-Nice seeing you today!!  -Lab work today; will notify you once results are available.  -Mammogram and DEXA scan ordered.  -Schedule follow up in 1 year or sooner as needed.   Preventive Care 38 Years and Older, Female Preventive care refers to lifestyle choices and visits with your health care provider that can promote health and wellness. This includes:  A yearly physical exam. This is also called an annual wellness visit.  Regular dental and eye exams.  Immunizations.  Screening for certain conditions.  Healthy lifestyle choices, such as: ? Eating a healthy diet. ? Getting regular exercise. ? Not using drugs or products that contain nicotine and tobacco. ? Limiting alcohol use. What can I expect for my preventive care visit? Physical exam Your health care provider will check your:  Height and weight. These may be used to calculate your BMI (body mass index). BMI is a measurement that tells if you are at a healthy weight.  Heart rate and blood pressure.  Body temperature.  Skin for abnormal spots. Counseling Your health care provider may ask you questions about your:  Past medical problems.  Family's medical history.  Alcohol, tobacco, and drug use.  Emotional well-being.  Home life and relationship well-being.  Sexual activity.  Diet, exercise, and sleep habits.  History of falls.  Memory and ability to understand (cognition).  Work and work Statistician.  Pregnancy and menstrual history.  Access to firearms. What immunizations do I need? Vaccines are usually given at various ages, according to a schedule. Your health care provider will recommend vaccines for you based on your age, medical history, and lifestyle or other factors, such as travel or where you work.   What tests do I need? Blood tests  Lipid and cholesterol levels. These may be checked every 5 years, or more often depending on your overall health.  Hepatitis C test.  Hepatitis B  test. Screening  Lung cancer screening. You may have this screening every year starting at age 26 if you have a 30-pack-year history of smoking and currently smoke or have quit within the past 15 years.  Colorectal cancer screening. ? All adults should have this screening starting at age 82 and continuing until age 28. ? Your health care provider may recommend screening at age 21 if you are at increased risk. ? You will have tests every 1-10 years, depending on your results and the type of screening test.  Diabetes screening. ? This is done by checking your blood sugar (glucose) after you have not eaten for a while (fasting). ? You may have this done every 1-3 years.  Mammogram. ? This may be done every 1-2 years. ? Talk with your health care provider about how often you should have regular mammograms.  Abdominal aortic aneurysm (AAA) screening. You may need this if you are a current or former smoker.  BRCA-related cancer screening. This may be done if you have a family history of breast, ovarian, tubal, or peritoneal cancers. Other tests  STD (sexually transmitted disease) testing, if you are at risk.  Bone density scan. This is done to screen for osteoporosis. You may have this done starting at age 89. Talk with your health care provider about your test results, treatment options, and if necessary, the need for more tests. Follow these instructions at home: Eating and drinking  Eat a diet that includes fresh fruits and vegetables, whole grains, lean protein, and low-fat dairy products. Limit your intake of foods with high amounts of  sugar, saturated fats, and salt.  Take vitamin and mineral supplements as recommended by your health care provider.  Do not drink alcohol if your health care provider tells you not to drink.  If you drink alcohol: ? Limit how much you have to 0-1 drink a day. ? Be aware of how much alcohol is in your drink. In the [place] one drink equals one 12 oz  bottle of beer (355 mL), one 5 oz glass of wine (148 mL), or one 1 oz glass of hard liquor (44 mL).   Lifestyle  Take daily care of your teeth and gums. Brush your teeth every morning and night with fluoride toothpaste. Floss one time each day.  Stay active. Exercise for at least 30 minutes 5 or more days each week.  Do not use any products that contain nicotine or tobacco, such as cigarettes, e-cigarettes, and chewing tobacco. If you need help quitting, ask your health care provider.  Do not use drugs.  If you are sexually active, practice safe sex. Use a condom or other form of protection in order to prevent STIs (sexually transmitted infections).  Talk with your health care provider about taking a low-dose aspirin or statin.  Find healthy ways to cope with stress, such as: ? Meditation, yoga, or listening to music. ? Journaling. ? Talking to a trusted person. ? Spending time with friends and family. Safety  Always wear your seat belt while driving or riding in a vehicle.  Do not drive: ? If you have been drinking alcohol. Do not ride with someone who has been drinking. ? When you are tired or distracted. ? While texting.  Wear a helmet and other protective equipment during sports activities.  If you have firearms in your house, make sure you follow all gun safety procedures. What's next?  Visit your health care provider once a year for an annual wellness visit.  Ask your health care provider how often you should have your eyes and teeth checked.  Stay up to date on all vaccines. This information is not intended to replace advice given to you by your health care provider. Make sure you discuss any questions you have with your health care provider. Document Revised: 05/06/2020 Document Reviewed: 05/10/2018 Elsevier Patient Education  2021 [employer].

## 2020-08-05 NOTE — Addendum Note (Signed)
Addended by: Evert Kohl D on: 08/05/2020 10:50 AM   Modules accepted: Orders

## 2020-08-05 NOTE — Progress Notes (Signed)
Established Patient Office Visit     This visit occurred during the SARS-CoV-2 public health emergency.  Safety protocols were in place, including screening questions prior to the visit, additional usage of staff PPE, and extensive cleaning of exam room while observing appropriate contact time as indicated for disinfecting solutions.    CC/Reason for Visit: Annual preventive exam and subsequent Medicare wellness visit  HPI: Kelly Howard is a 69 y.o. female who is coming in today for the above mentioned reasons. Past Medical History is significant for: Parkinson's disease with frequent falls followed by neurology, history of generalized anxiety disorder and panic attacks followed by psychiatry with recent plan to taper off Klonopin due to frequent falls.  Also has a history of osteoporosis, hypothyroidism as well as vitamin D and vitamin B12 deficiency.  She has had some falls since I last saw her.  Her husband accompanies her today.  Agrees to some cognitive decline.  She is overdue for mammogram and DEXA scan.  She had a colonoscopy in 2019.  She is fully immunized against COVID and influenza.  She has routine eye and dental care.  No perceived hearing issues.  She exercise on her bike on a daily basis.   Past Medical/Surgical History: Past Medical History:  Diagnosis Date  . Frequent falls   . Hypothyroidism   . Osteoporosis   . Parkinson's disease [facility])     Past Surgical History:  Procedure Laterality Date  . TONSILLECTOMY    . WRIST SURGERY      Social History:  reports that she has never smoked. She has never used smokeless tobacco. She reports current alcohol use. She reports that she does not use drugs.  Allergies: Allergies  Allergen Reactions  . Codeine Other (See Comments)    GI Upset  . Escitalopram Swelling    Foot swelling  . Propoxyphene Other (See Comments)    Hallucinations    Family History:  Family History  Problem Relation Age of Onset   . Hypercalcemia Mother   . Cancer Paternal Grandmother   . Diabetes Paternal Grandfather      Current Outpatient Medications:  .  Acetaminophen (TYLENOL PO), Take 500 mg by mouth as needed., Disp: , Rfl:  .  AMBULATORY NON FORMULARY MEDICATION, U step walker  Dx: G20, Disp: 1 Device, Rfl: 0 .  AMBULATORY NON FORMULARY MEDICATION, Abdominal compression binder Dx: G20, Disp: 1 Device, Rfl: 0 .  carbidopa-levodopa (SINEMET CR) 50-200 MG tablet, TAKE 1 TABLET BY MOUTH EVERYDAY AT BEDTIME, Disp: 30 tablet, Rfl: 3 .  carbidopa-levodopa (SINEMET IR) 25-100 MG tablet, TAKE 1.5 TABLETS BY MOUTH 5 (FIVE) TIMES DAILY. 1.5 TABLET EVERY 3 HRS, Disp: 675 tablet, Rfl: 1 .  cholecalciferol (VITAMIN D3) 25 MCG (1000 UNIT) tablet, Take 1,000 Units by mouth daily., Disp: , Rfl:  .  clonazePAM (KLONOPIN) 0.5 MG tablet, Take 1 tablet (0.5 mg total) by mouth 3 (three) times daily as needed for anxiety., Disp: 90 tablet, Rfl: 2 .  ibandronate (BONIVA) 150 MG tablet, Take 150 mg by mouth every 30 (thirty) days., Disp: , Rfl:  .  levothyroxine (SYNTHROID, LEVOTHROID) 50 MCG tablet, Take 50 mcg by mouth daily before breakfast., Disp: , Rfl:  .  MELATONIN PO, Take 3 mg by mouth daily., Disp: , Rfl:  .  omeprazole (PRILOSEC) 40 MG capsule, TAKE 1 CAPSULE BY MOUTH EVERY DAY BEFORE DINNER, Disp: 90 capsule, Rfl: 1 .  rasagiline (AZILECT) 1 MG TABS tablet, TAKE 1  TABLET BY MOUTH EVERY DAY, Disp: 90 tablet, Rfl: 1 .  sertraline (ZOLOFT) 100 MG tablet, TAKE ONE TABLET DAILY., Disp: 90 tablet, Rfl: 3  Review of Systems:  Constitutional: Denies fever, chills, diaphoresis, appetite change and fatigue.  HEENT: Denies photophobia, eye pain, redness, hearing loss, ear pain, congestion, sore throat, rhinorrhea, sneezing, mouth sores, trouble swallowing, neck pain, neck stiffness and tinnitus.   Respiratory: Denies SOB, DOE, cough, chest tightness,  and wheezing.   Cardiovascular: Denies chest pain, palpitations and leg swelling.   Gastrointestinal: Denies nausea, vomiting, abdominal pain, diarrhea, constipation, blood in stool and abdominal distention.  Genitourinary: Denies dysuria, urgency, frequency, hematuria, flank pain and difficulty urinating.  Endocrine: Denies: hot or cold intolerance, sweats, changes in hair or nails, polyuria, polydipsia. Musculoskeletal: Denies myalgias, back pain, joint swelling, arthralgias and gait problem.  Skin: Denies pallor, rash and wound.  Neurological: Denies dizziness, seizures, syncope,  light-headedness, numbness and headaches.  Hematological: Denies adenopathy. Easy bruising, personal or family bleeding history  Psychiatric/Behavioral: Denies suicidal ideation, mood changes, confusion, nervousness, sleep disturbance and agitation    Physical Exam: Vitals:   08/05/20 1009  BP: 120/80  Pulse: 63  Temp: 98.1 F (36.7 C)  TempSrc: Oral  SpO2: 96%  Weight: 131 lb (59.4 kg)  Height: 5' 5.5" (1.664 m)    Body mass index is 21.47 kg/m.   Constitutional: NAD, calm, comfortable Eyes: PERRL, lids and conjunctivae normal ENMT: Mucous membranes are moist. Posterior pharynx clear of any exudate or lesions. Normal dentition. Tympanic membrane is pearly white, no erythema or bulging. Neck: normal, supple, no masses, no thyromegaly Respiratory: clear to auscultation bilaterally, no wheezing, no crackles. Normal respiratory effort. No accessory muscle use.  Cardiovascular: Regular rate and rhythm, no murmurs / rubs / gallops. No extremity edema. 2+ pedal pulses. Abdomen: no tenderness, no masses palpated. No hepatosplenomegaly. Bowel sounds positive.  Musculoskeletal: no clubbing / cyanosis. No joint deformity upper and lower extremities. Good ROM, no contractures. Normal muscle tone.  Skin: no rashes, lesions, ulcers. No induration Neurologic: CN 2-12 grossly intact. Sensation intact, DTR normal. Strength 5/5 in all 4.  Psychiatric: Normal judgment and insight. Alert and  oriented x 3. Normal mood.    Subsequent Medicare wellness visit   1. Risk factors, based on past  M,S,F -cardiovascular disease risk factors include age only.   2.  Physical activities: Rides her stationary bike on a daily basis   3.  Depression/mood:  Stable, not depressed   4.  Hearing:  No perceived issues   5.  ADL's: Independent in all ADLs   6.  Fall risk:  High fall risk   7.  Home safety: No problems identified   8.  Height weight, and visual acuity: height and weight as above, vision:   Visual Acuity Screening   Right eye Left eye Both eyes  Without correction: '20/80 20/32 20/32 '  With correction:        9.  Counseling:  Advised to increase physical activity as able   10. Lab orders based on risk factors: Laboratory update will be reviewed   11. Referral :  For DEXA and mammogram   12. Care plan:  Follow-up with me in 6 to 12 months   13. Cognitive assessment:  Mild cognitive impairment   14. Screening: Patient provided with a written and personalized 5-10 year screening schedule in the AVS.   yes   15. Provider List Update:   PCP, Dr. Carles Collet neurology, psychiatry NP  16.  Advance Directives: Full code   17. Opioids: Patient is not on any opioid prescriptions and has no risk factors for a substance use disorder.   [place] Office Visit from 12/31/2019 in [facility] [place]  PHQ-9 Total Score 0      Fall Risk  08/05/2020 05/20/2020 05/12/2020 11/07/2019 06/10/2019  Falls in the past year? '1 1 1 1 1  ' Number falls in past yr: '1 1 1 1 1  ' Injury with Fall? 1 0 '1 1 1     ' Impression and Plan:  Encounter for preventive health examination  -She has routine eye and dental care. -All immunizations are up-to-date. -Screening labs today. -Healthy lifestyle discussed in detail. -She had a colonoscopy in 2019 and is a 10-year callback. -She is overdue for screening mammogram, I will order. -DEXA scan requested today. -Given age and comorbidities  okay to forego Pap smear at this point.  Parkinson disease ([place])  Frequent falls -She does not like to use her walker, have encouraged her to do so.  She is weaning off Klonopin that is likely contributing to falls, she is doing physical therapy.  Age-related osteoporosis without current pathological fracture -DEXA scan today Hypothyroidism, unspecified type  - Plan: TSH  Vitamin B12 deficiency  - Plan: Vitamin B12  Vitamin D deficiency  - Plan: VITAMIN D 25 Hydroxy (Vit-D Deficiency, Fractures)   Patient Instructions   -Nice seeing you today!!  -Lab work today; will notify you once results are available.  -Mammogram and DEXA scan ordered.  -Schedule follow up in 1 year or sooner as needed.   Preventive Care 60 Years and Older, Female Preventive care refers to lifestyle choices and visits with your health care provider that can promote health and wellness. This includes:  A yearly physical exam. This is also called an annual wellness visit.  Regular dental and eye exams.  Immunizations.  Screening for certain conditions.  Healthy lifestyle choices, such as: ? Eating a healthy diet. ? Getting regular exercise. ? Not using drugs or products that contain nicotine and tobacco. ? Limiting alcohol use. What can I expect for my preventive care visit? Physical exam Your health care provider will check your:  Height and weight. These may be used to calculate your BMI (body mass index). BMI is a measurement that tells if you are at a healthy weight.  Heart rate and blood pressure.  Body temperature.  Skin for abnormal spots. Counseling Your health care provider may ask you questions about your:  Past medical problems.  Family's medical history.  Alcohol, tobacco, and drug use.  Emotional well-being.  Home life and relationship well-being.  Sexual activity.  Diet, exercise, and sleep habits.  History of falls.  Memory and ability to understand  (cognition).  Work and work Statistician.  Pregnancy and menstrual history.  Access to firearms. What immunizations do I need? Vaccines are usually given at various ages, according to a schedule. Your health care provider will recommend vaccines for you based on your age, medical history, and lifestyle or other factors, such as travel or where you work.   What tests do I need? Blood tests  Lipid and cholesterol levels. These may be checked every 5 years, or more often depending on your overall health.  Hepatitis C test.  Hepatitis B test. Screening  Lung cancer screening. You may have this screening every year starting at age 31 if you have a 30-pack-year history of smoking and currently smoke or have quit within the  past 15 years.  Colorectal cancer screening. ? All adults should have this screening starting at age 90 and continuing until age 105. ? Your health care provider may recommend screening at age 79 if you are at increased risk. ? You will have tests every 1-10 years, depending on your results and the type of screening test.  Diabetes screening. ? This is done by checking your blood sugar (glucose) after you have not eaten for a while (fasting). ? You may have this done every 1-3 years.  Mammogram. ? This may be done every 1-2 years. ? Talk with your health care provider about how often you should have regular mammograms.  Abdominal aortic aneurysm (AAA) screening. You may need this if you are a current or former smoker.  BRCA-related cancer screening. This may be done if you have a family history of breast, ovarian, tubal, or peritoneal cancers. Other tests  STD (sexually transmitted disease) testing, if you are at risk.  Bone density scan. This is done to screen for osteoporosis. You may have this done starting at age 13. Talk with your health care provider about your test results, treatment options, and if necessary, the need for more tests. Follow these  instructions at home: Eating and drinking  Eat a diet that includes fresh fruits and vegetables, whole grains, lean protein, and low-fat dairy products. Limit your intake of foods with high amounts of sugar, saturated fats, and salt.  Take vitamin and mineral supplements as recommended by your health care provider.  Do not drink alcohol if your health care provider tells you not to drink.  If you drink alcohol: ? Limit how much you have to 0-1 drink a day. ? Be aware of how much alcohol is in your drink. In the [place] one drink equals one 12 oz bottle of beer (355 mL), one 5 oz glass of wine (148 mL), or one 1 oz glass of hard liquor (44 mL).   Lifestyle  Take daily care of your teeth and gums. Brush your teeth every morning and night with fluoride toothpaste. Floss one time each day.  Stay active. Exercise for at least 30 minutes 5 or more days each week.  Do not use any products that contain nicotine or tobacco, such as cigarettes, e-cigarettes, and chewing tobacco. If you need help quitting, ask your health care provider.  Do not use drugs.  If you are sexually active, practice safe sex. Use a condom or other form of protection in order to prevent STIs (sexually transmitted infections).  Talk with your health care provider about taking a low-dose aspirin or statin.  Find healthy ways to cope with stress, such as: ? Meditation, yoga, or listening to music. ? Journaling. ? Talking to a trusted person. ? Spending time with friends and family. Safety  Always wear your seat belt while driving or riding in a vehicle.  Do not drive: ? If you have been drinking alcohol. Do not ride with someone who has been drinking. ? When you are tired or distracted. ? While texting.  Wear a helmet and other protective equipment during sports activities.  If you have firearms in your house, make sure you follow all gun safety procedures. What's next?  Visit your health care provider once a  year for an annual wellness visit.  Ask your health care provider how often you should have your eyes and teeth checked.  Stay up to date on all vaccines. This information is not intended to replace advice  given to you by your health care provider. Make sure you discuss any questions you have with your health care provider. Document Revised: 05/06/2020 Document Reviewed: 05/10/2018 Elsevier Patient Education  2021 [place], [place] [facility] [address]

## 2020-08-06 ENCOUNTER — Other Ambulatory Visit: Payer: Self-pay | Admitting: Internal Medicine

## 2020-08-06 ENCOUNTER — Telehealth: Payer: Self-pay | Admitting: *Deleted

## 2020-08-06 DIAGNOSIS — E559 Vitamin D deficiency, unspecified: Secondary | ICD-10-CM

## 2020-08-06 MED ORDER — VITAMIN D (ERGOCALCIFEROL) 1.25 MG (50000 UNIT) PO CAPS: 50000.0000 [IU] | ORAL_CAPSULE | ORAL | 0 refills | Status: DC

## 2020-08-06 NOTE — Chronic Care Management (AMB) (Signed)
  Chronic Care Management   Note  08/06/2020 Name: Kelly Howard MRN: [id number] DOB: May 13, 1951  Kelly Howard is a 70 y.o. year old female who is a primary care patient of [name], Rayford Halsted, MD. I reached out to Idelle Jo by phone today in response to a referral sent by Kelly Howard's PCP, [name], Rayford Halsted, MD     Kelly Howard was given information about Chronic Care Management services today including:  1. CCM service includes personalized support from designated clinical staff supervised by her physician, including individualized plan of care and coordination with other care providers 2. 24/7 contact phone numbers for assistance for urgent and routine care needs. 3. Service will only be billed when office clinical staff spend 20 minutes or more in a month to coordinate care. 4. Only one practitioner may furnish and bill the service in a calendar month. 5. The patient may stop CCM services at any time (effective at the end of the month) by phone call to the office staff. 6. The patient will be responsible for cost sharing (co-pay) of up to 20% of the service fee (after annual deductible is met).  Patient agreed to services and verbal consent obtained.   Follow up plan: Telephone appointment with care management team member scheduled for: 08/10/2020 [place] [facility]

## 2020-08-10 ENCOUNTER — Telehealth: Payer: Self-pay | Admitting: Internal Medicine

## 2020-08-10 ENCOUNTER — Telehealth: Payer: Self-pay | Admitting: *Deleted

## 2020-08-10 ENCOUNTER — Telehealth: Payer: Medicare PPO

## 2020-08-10 NOTE — Telephone Encounter (Signed)
The patient is wanting to know if she needs to keep taking ibandronate (BONIVA) 150 MG tablet and she also wanted to know if she needs to schedule for labs to check her B12 after her 4th injection and how many months is she scheduling for her B12 injections after her 4 weeks.  Please advise

## 2020-08-10 NOTE — Chronic Care Management (AMB) (Signed)
  Care Management   Note  08/10/2020 Name: Kelly Howard MRN: [id number] DOB: Jun 16, 1950  Kelly Howard is a 70 y.o. year old female who is a primary care patient of Philip Aspen, Limmie Patricia, MD and is actively engaged with the care management team. I reached out to [name] by phone today to assist with re-scheduling an initial visit with the RN Case Manager  Follow up plan: Unsuccessful telephone outreach attempt made. A HIPAA compliant phone message was left for the patient providing contact information and requesting a return call.  The care management team will reach out to the patient again over the next 7 days.  If patient returns call to provider office, please advise to call Embedded Care Management Care Guide Gwenevere Ghazi at [phone].  Gwenevere Ghazi  Care Guide, Embedded Care Coordination [facility] Management

## 2020-08-11 ENCOUNTER — Other Ambulatory Visit: Payer: Self-pay

## 2020-08-11 ENCOUNTER — Ambulatory Visit (INDEPENDENT_AMBULATORY_CARE_PROVIDER_SITE_OTHER): Payer: Medicare PPO | Admitting: *Deleted

## 2020-08-11 DIAGNOSIS — E538 Deficiency of other specified B group vitamins: Secondary | ICD-10-CM

## 2020-08-11 MED ORDER — CYANOCOBALAMIN 1000 MCG/ML IJ SOLN
1000.0000 ug | Freq: Once | INTRAMUSCULAR | Status: AC
  Administered 2020-08-11: 1000 ug via INTRAMUSCULAR

## 2020-08-11 NOTE — Progress Notes (Signed)
Per orders of Dr. Hernandez, injection of Cyanocobalamin 1000mcg given by Funderburk, Jo A. Patient tolerated injection well. 

## 2020-08-11 NOTE — Telephone Encounter (Signed)
Patient is aware 

## 2020-08-11 NOTE — Telephone Encounter (Signed)
Should continue Boniva monthly. I would do at least 6 months of IM B12. No need to recheck now.

## 2020-08-17 ENCOUNTER — Telehealth: Payer: Self-pay | Admitting: Adult Health

## 2020-08-17 NOTE — Telephone Encounter (Signed)
Tried to reach husband, Jomarie Longs but it went to vm and vm is full. Unsure if requesting dose changes or if she's doing fine for now.    Sheralyn Boatman can you try in the morning.

## 2020-08-17 NOTE — Telephone Encounter (Signed)
Noted  

## 2020-08-17 NOTE — Telephone Encounter (Signed)
Please review

## 2020-08-17 NOTE — Telephone Encounter (Signed)
Kelly Howard and husband(Kelly Howard) called in today stating that she discussed uping dosage for Setraline. Says she is doing fine and still tapering down from the Clonazepam.Kelly Howard states you can call if necessary. Ph: [phone].  Appt 4/7 [facility] [address] [place], [place].

## 2020-08-18 ENCOUNTER — Ambulatory Visit (INDEPENDENT_AMBULATORY_CARE_PROVIDER_SITE_OTHER): Payer: Medicare PPO | Admitting: *Deleted

## 2020-08-18 ENCOUNTER — Other Ambulatory Visit: Payer: Self-pay | Admitting: Adult Health

## 2020-08-18 ENCOUNTER — Other Ambulatory Visit: Payer: Self-pay

## 2020-08-18 DIAGNOSIS — E538 Deficiency of other specified B group vitamins: Secondary | ICD-10-CM

## 2020-08-18 DIAGNOSIS — F331 Major depressive disorder, recurrent, moderate: Secondary | ICD-10-CM

## 2020-08-18 DIAGNOSIS — F411 Generalized anxiety disorder: Secondary | ICD-10-CM

## 2020-08-18 MED ORDER — SERTRALINE HCL 100 MG PO TABS: ORAL_TABLET | ORAL | 3 refills | Status: DC

## 2020-08-18 MED ORDER — CYANOCOBALAMIN 1000 MCG/ML IJ SOLN
1000.0000 ug | Freq: Once | INTRAMUSCULAR | Status: AC
  Administered 2020-08-18: 1000 ug via INTRAMUSCULAR

## 2020-08-18 NOTE — Telephone Encounter (Signed)
Advise to increase dose to 150mg  daily and report back.

## 2020-08-18 NOTE — Telephone Encounter (Signed)
Script sent  

## 2020-08-18 NOTE — Telephone Encounter (Signed)
I spoke to Kelly Howard.He stated she is doing ok with tapering off the klonopin but she still is a little confused and anxious.He said that you mentioned increasing sertraline and he believes this will be a good idea.

## 2020-08-18 NOTE — Progress Notes (Signed)
Per orders of Dr. Hernandez, injection of Cyanocobalamin 1000mcg given by Funderburk, Jo A. Patient tolerated injection well. 

## 2020-08-18 NOTE — Telephone Encounter (Signed)
They have been informed.She will need a refill also.

## 2020-08-20 ENCOUNTER — Telehealth: Payer: Self-pay | Admitting: Internal Medicine

## 2020-08-20 NOTE — Telephone Encounter (Signed)
The patient wants the nurse to contact her in reference to a medicine change.

## 2020-08-21 NOTE — Chronic Care Management (AMB) (Signed)
  Care Management   Note  08/21/2020 Name: Kelly Howard MRN: [id number] DOB: April 03, 1951  Kelly Howard is a 70 y.o. year old female who is a primary care patient of Philip Aspen, Limmie Patricia, MD and is actively engaged with the care management team. I reached out to [name] by phone today to assist with re-scheduling an initial visit with the RN Case Manager  Follow up plan: Telephone appointment with care management team member scheduled for:08/27/2020  [facility] Guide, Embedded Care Coordination [facility] Management

## 2020-08-21 NOTE — Telephone Encounter (Signed)
Was speaking with the patient, but had a bad connection.  Need more information as to which medication she is concerned with.

## 2020-08-25 ENCOUNTER — Other Ambulatory Visit: Payer: Self-pay

## 2020-08-25 ENCOUNTER — Ambulatory Visit (INDEPENDENT_AMBULATORY_CARE_PROVIDER_SITE_OTHER): Payer: Medicare PPO

## 2020-08-25 DIAGNOSIS — E538 Deficiency of other specified B group vitamins: Secondary | ICD-10-CM

## 2020-08-25 MED ORDER — CYANOCOBALAMIN 1000 MCG/ML IJ SOLN
1000.0000 ug | Freq: Once | INTRAMUSCULAR | Status: AC
  Administered 2020-08-25: 1000 ug via INTRAMUSCULAR

## 2020-08-25 NOTE — Progress Notes (Signed)
Per orders of Dr. Hernandez, injection of B12 given by Erina Hamme E Ezra Marquess. Patient tolerated injection well.  

## 2020-08-27 ENCOUNTER — Ambulatory Visit (INDEPENDENT_AMBULATORY_CARE_PROVIDER_SITE_OTHER): Payer: Medicare PPO

## 2020-08-27 DIAGNOSIS — M810 Age-related osteoporosis without current pathological fracture: Secondary | ICD-10-CM

## 2020-08-27 DIAGNOSIS — G20 Parkinson's disease: Secondary | ICD-10-CM | POA: Diagnosis not present

## 2020-08-27 DIAGNOSIS — R296 Repeated falls: Secondary | ICD-10-CM

## 2020-08-27 DIAGNOSIS — F411 Generalized anxiety disorder: Secondary | ICD-10-CM

## 2020-08-27 NOTE — Chronic Care Management (AMB) (Signed)
Chronic Care Management   CCM RN Visit Note  08/27/2020 Name: Kelly Howard MRN: [id number] DOB: 1950-10-01  Subjective: Kelly Howard is a 70 y.o. year old female who is a primary care patient of [name], Rayford Halsted, MD. The care management team was consulted for assistance with disease management and care coordination needs.    Engaged with patient by telephone for initial visit in response to provider referral for case management and/or care coordination services.   Consent to Services:  The patient was given the following information about Chronic Care Management services today, agreed to services, and gave verbal consent: 1. CCM service includes personalized support from designated clinical staff supervised by the primary care provider, including individualized plan of care and coordination with other care providers 2. 24/7 contact phone numbers for assistance for urgent and routine care needs. 3. Service will only be billed when office clinical staff spend 20 minutes or more in a month to coordinate care. 4. Only one practitioner may furnish and bill the service in a calendar month. 5.The patient may stop CCM services at any time (effective at the end of the month) by phone call to the office staff. 6. The patient will be responsible for cost sharing (co-pay) of up to 20% of the service fee (after annual deductible is met). Patient agreed to services and consent obtained.  Patient agreed to services and verbal consent obtained.   Assessment: Review of patient past medical history, allergies, medications, health status, including review of consultants reports, laboratory and other test data, was performed as part of comprehensive evaluation and provision of chronic care management services.   SDOH (Social Determinants of Health) assessments and interventions performed:  SDOH Interventions   Flowsheet Row Most Recent Value  SDOH Interventions   Food Insecurity  Interventions Intervention Not Indicated  Housing Interventions Intervention Not Indicated  Stress Interventions Provide Counseling  [currenlty followed by couselor]  Transportation Interventions Intervention Not Indicated  Depression Interventions/Treatment  Medication, Counseling       CCM Care Plan  Allergies  Allergen Reactions  . Codeine Other (See Comments)    GI Upset  . Escitalopram Swelling    Foot swelling  . Propoxyphene Other (See Comments)    Hallucinations    Outpatient Encounter Medications as of 08/27/2020  Medication Sig  . Acetaminophen (TYLENOL PO) Take 500 mg by mouth as needed.  . AMBULATORY NON FORMULARY MEDICATION U step walker  Dx: G20  . AMBULATORY NON FORMULARY MEDICATION Abdominal compression binder Dx: G20  . carbidopa-levodopa (SINEMET CR) 50-200 MG tablet TAKE 1 TABLET BY MOUTH EVERYDAY AT BEDTIME  . carbidopa-levodopa (SINEMET IR) 25-100 MG tablet TAKE 1.5 TABLETS BY MOUTH 5 (FIVE) TIMES DAILY. 1.5 TABLET EVERY 3 HRS  . cholecalciferol (VITAMIN D3) 25 MCG (1000 UNIT) tablet Take 1,000 Units by mouth daily.  . clonazePAM (KLONOPIN) 0.5 MG tablet Take 1 tablet (0.5 mg total) by mouth 3 (three) times daily as needed for anxiety.  . ibandronate (BONIVA) 150 MG tablet Take 150 mg by mouth every 30 (thirty) days.  Marland Kitchen levothyroxine (SYNTHROID, LEVOTHROID) 50 MCG tablet Take 50 mcg by mouth daily before breakfast.  . MELATONIN PO Take 3 mg by mouth daily.  Marland Kitchen omeprazole (PRILOSEC) 40 MG capsule TAKE 1 CAPSULE BY MOUTH EVERY DAY BEFORE DINNER  . rasagiline (AZILECT) 1 MG TABS tablet TAKE 1 TABLET BY MOUTH EVERY DAY  . sertraline (ZOLOFT) 100 MG tablet TAKE ONE AND 1/2 TABLETS DAILY.  Marland Kitchen Vitamin D,  Ergocalciferol, (DRISDOL) 1.25 MG (50000 UNIT) CAPS capsule Take 1 capsule (50,000 Units total) by mouth every 7 (seven) days for 12 doses.   No facility-administered encounter medications on file as of 08/27/2020.    Patient Active Problem List   Diagnosis Date  Noted  . Vitamin D deficiency 07/10/2019  . Vitamin B12 deficiency 07/10/2019  . Hypothyroidism   . Frequent falls   . Osteoporosis   . Closed fracture of right distal radius 12/25/2018  . Parkinson disease ([place]) 10/20/2011    Conditions to be addressed/monitored:Anxiety and Parkinson's disease, osteoporosis  Care Plan : RNCM:Osteoporosis (Adult)  Updates made by Dimitri Ped, RN since 08/27/2020 12:00 AM    Problem: Liliane Channel of harm or Injury related to hx of frequent falls and osteoporosis   Priority: High    Long-Range Goal: Harm or Injury Prevented   Start Date: 08/27/2020  Expected End Date: 11/27/2020  This Visit's Progress: On track  Priority: High  Note:   Current Barriers:  Marland Kitchen Knowledge Deficits related to fall precautions in patient with osteoporosis and Parkinson's disease pt has had over 4 falls in last year, reports not using walker at all times,  . Decreased adherence to prescribed treatment for fall prevention . Does not adhere to provider recommendations re: use of walker . Does not adhere to prescribed psychotropic medication regimen . Unable to perform IADLs independently-family assists as needed . Knowledge Deficits related to fall prevention and safety . Chronic Disease Management support and education needs related to fall prevention and safety, osteoporosis Clinical Goal(s):  . patient will demonstrate improved adherence to prescribed treatment plan for decreasing falls as evidenced by patient reporting and review of EMR . patient will verbalize using fall risk reduction strategies discussed . patient will not experience additional falls . patient will verbalize understanding of plan for fall prevention and safety . patient will meet with RN Care Manager to address educational needs for fall prevention and osteoporosis . patient will attend all scheduled medical appointments: no scheduled primary care visit, neurology 11/12/20 . patient will demonstrate  improved adherence to prescribed treatment plan for fall prevention as evidenced by reported fewer falls Interventions:  . Collaboration with [name], Rayford Halsted, MD regarding development and update of comprehensive plan of care as evidenced by provider attestation and co-signature . Inter-disciplinary care team collaboration (see longitudinal plan of care) . Provided written and verbal education re: Potential causes of falls and Fall prevention strategies . Reviewed medications and discussed potential side effects of medications such as dizziness and frequent urination . Assessed for s/s of orthostatic hypotension . Assessed for falls since last encounter. . Assessed patients knowledge of fall risk prevention secondary to previously provided education. . Assessed working status of life alert bracelet and patient adherence . Evaluation of current treatment plan related to fall safety and patient's adherence to plan as established by provider. . Advised patient to use walker at all times . Provided education to patient re: fall prevention, safety and osteoporosis  . Provided patient with written educational materials related to fall prevention and safety . Discussed plans with patient for ongoing care management follow up and provided patient with direct contact information for care management team Self-Care Deficits:  Does not adhere to provider recommendations re:  Unable to perform IADLs independently Patient Goals:  - Utilize walker (assistive device) appropriately with all ambulation - De-clutter walkways - Change positions slowly - Wear secure fitting shoes at all times with ambulation - Utilize home lighting  for dim lit areas - Demonstrate self and pet awareness at all times - always use handrails on the stairs - always wear shoes or slippers with non-slip sole - get at least 10 minutes of activity every day - install bathroom grab bars - keep a flashlight by the bed - keep  cell phone with me always - make an emergency alert plan in case I fall - pick up clutter from the floors - remove, or use a non-slip pad, with my throw rugs - use a cane or walker at all times - use a nightlight in the bathroom Follow Up Plan: Telephone follow up appointment with care management team member scheduled for: 09/25/20 at 10 AM   Care Plan : RNCM: Parkinson's disease  Updates made by Dimitri Ped, RN since 08/27/2020 12:00 AM    Problem: Lack of coping skills for Parkinson's disease   Priority: Medium    Long-Range Goal: Improved coping skills for Parkinson's disease   Start Date: 08/27/2020  Expected End Date: 11/27/2020  This Visit's Progress: On track  Priority: Medium  Note:   Current Barriers:   Ineffective Self Health Maintenance for Parkinson's disease   Does not adhere to provider recommendations re:   Unable to perform IADLs independently  Pt has had issues with frequent falls and coping with her disease, she is followed by counselor regularly, She goes [facility] [employer] 1-2 times a week for Parkinson's exercise class, rides exercise bike and does weights, reports she has not been using her walker at all times  Clinical Goal(s):  Marland Kitchen Collaboration with [name], Rayford Halsted, MD regarding development and update of comprehensive plan of care as evidenced by provider attestation and co-signature . Inter-disciplinary care team collaboration (see longitudinal plan of care)  patient will work with care management team to address care coordination and chronic disease management needs related to Disease Management  Educational Needs   Interventions:   Evaluation of current treatment plan related to  Parkinson's disease , ADL IADL limitations and Mental Health Concerns  self-management and patient's adherence to plan as established by provider.  Collaboration with [name], Rayford Halsted, MD regarding development and update of comprehensive plan of care as  evidenced by provider attestation       and co-signature  Inter-disciplinary care team collaboration (see longitudinal plan of care)  Discussed plans with patient for ongoing care management follow up and provided patient with direct contact information for care management team  Instructed to continue to follow up with neurology and counselor   Encourged to continue going to gym for Parkinson's classes Self Care Activities:  . Patient verbalizes understanding of plan to improve coping skills and self management of Parkinson's disease . Self administers medications as prescribed . Attends all scheduled provider appointments . Calls provider office for new concerns or questions Patient Goals: - repeat what I heard to make sure I understand - bring a list of my medicines to the visit - speak up when I don't understand - keep a calendar with appointment dates - make a list of family or friends that I can call - learn and use visualization or guided imagery - practice relaxation or meditation daily - start or continue a personal journal - talk about feelings with a friend, family or spiritual advisor Follow Up Plan: Telephone follow up appointment with care management team member scheduled for: 09/25/20 at 10 AM     Plan:Telephone follow up appointment with care management team member scheduled  for:  09/25/20 Peter Garter RN, Jackquline Denmark, CDE Care Management Coordinator [place] [phone], Mobile [phone]

## 2020-08-27 NOTE — Patient Instructions (Signed)
Visit Information  PATIENT GOALS:  Goals Addressed            This Visit's Progress   . Manage My Emotions       Timeframe:  Long-Range Goal Priority:  Medium Start Date:      08/27/20                       Expected End Date:       11/27/20                Follow Up Date 09/25/20 10 AM    - learn and use visualization or guided imagery - practice relaxation or meditation daily - start or continue a personal journal - talk about feelings with a friend, family or spiritual advisor  -continue to work with counselor on stress and anxiety   Why is this important?    When you are stressed, down or upset, your body reacts too.   For example, your blood pressure may get higher; you may have a headache or stomachache.   When your emotions get the best of you, your body's ability to fight off cold and flu gets weak.   These steps will help you manage your emotions.     Notes:     . Prevent Falls and Broken Bones-Osteoporosis       Timeframe:  Long-Range Goal Priority:  High Start Date:     08/27/20                        Expected End Date:   11/27/20                    Follow Up Date 09/25/20    - always use handrails on the stairs - always wear shoes or slippers with non-slip sole - get at least 10 minutes of activity every day - install bathroom grab bars - keep a flashlight by the bed - keep cell phone with me always - make an emergency alert plan in case I fall - pick up clutter from the floors - remove, or use a non-slip pad, with my throw rugs - use a cane or walker - use a nightlight in the bathroom    Why is this important?    When you fall, there are 3 things that control if a bone breaks or not.   These are the fall itself, how hard and the direction that you fall and how fragile your bones are.   Preventing falls is very important for you because of fragile bones.     Notes:      Preventing Falls and Fractures  Falls can be very serious, especially for  older adults or people with osteoporosis  Falls can be caused by:  Tripping or slipping  Slow reflexes  Balance problems  Reduced muscle strength  Poor vision or a recent change in prescription  Illness and some medications (especially blood pressure pills, diuretics, heart medicines, muscle relaxants and sleep medications)  Drinking alcohol  To prevent falls outdoors:  Use a can or walker if needed  Wear rubber-soled shoes so you don't slip  DO NOT buy "shape up" shoes with rocker bottom soles if you have balance problems.  The thick soles and shape make it more difficult to keep your balance.  Put kitty litter or salt on icy sidewalks  Walk on the grass if the sidewalks are slick  Avoid  walking on uneven ground whenever possible  T prevent falls indoors:  Keep rooms clutter-free, especially hallways, stairs and paths to light switches  Remove throw rugs  Install night lights, especially to and in the bathroom  Turn on lights before going downstairs  Keep a flashlight next to your bed  Buy a cordless phone to keep with you instead of jumping up to answer the phone  Install grab bars in the bathroom near the shower and toilet  Install rails on both sides of the stairs.  Make sure the stairs are well lit  Wear slippers with non-skid soles.  Do not walk around in stockings or socks  Balance problems and dizziness are not a normal part of growing older.  If you begin having balance problems or dizziness see your doctor.  Physical Therapy can help you with many balance problems, strengthening hip and leg muscles and with gait training.  To keep your bones healthy make sure you are getting enough calcium and Vitamin D each day.  Ask your doctor or pharmacist about supplements.  Regular weight-bearing exercise like walking, lifting weights or dancing can help strengthen bones and prevent osteoporosis.   Consent to CCM Services: Ms. Aaronson was given information  about Chronic Care Management services today including:  1. CCM service includes personalized support from designated clinical staff supervised by her physician, including individualized plan of care and coordination with other care providers 2. 24/7 contact phone numbers for assistance for urgent and routine care needs. 3. Service will only be billed when office clinical staff spend 20 minutes or more in a month to coordinate care. 4. Only one practitioner may furnish and bill the service in a calendar month. 5. The patient may stop CCM services at any time (effective at the end of the month) by phone call to the office staff. 6. The patient will be responsible for cost sharing (co-pay) of up to 20% of the service fee (after annual deductible is met).  Patient agreed to services and verbal consent obtained.   Patient verbalizes understanding of instructions provided today and agrees to view in [place].   Telephone follow up appointment with care management team member scheduled for: 09/25/20 at 87 AM Peter Garter RN, [facility], CDE Care Management Coordinator [facility] [phone], Mobile [phone]  CLINICAL CARE PLAN: Patient Care Plan: RNCM:Osteoporosis (Adult)    Problem Identified: Liliane Channel of harm or Injury related to hx of frequent falls and osteoporosis   Priority: High    Long-Range Goal: Harm or Injury Prevented   Start Date: 08/27/2020  Expected End Date: 11/27/2020  This Visit's Progress: On track  Priority: High  Note:   Current Barriers:  Marland Kitchen Knowledge Deficits related to fall precautions in patient with osteoporosis and Parkinson's disease pt has had over 4 falls in last year, reports not using walker at all times,  . Decreased adherence to prescribed treatment for fall prevention . Does not adhere to provider recommendations re: use of walker . Does not adhere to prescribed psychotropic medication regimen . Unable to perform IADLs  independently-family assists as needed . Knowledge Deficits related to fall prevention and safety . Chronic Disease Management support and education needs related to fall prevention and safety, osteoporosis Clinical Goal(s):  . patient will demonstrate improved adherence to prescribed treatment plan for decreasing falls as evidenced by patient reporting and review of EMR . patient will verbalize using fall risk reduction strategies discussed . patient will not experience additional falls . patient  will verbalize understanding of plan for fall prevention and safety . patient will meet with RN Care Manager to address educational needs for fall prevention and osteoporosis . patient will attend all scheduled medical appointments: no scheduled primary care visit, neurology 11/12/20 . patient will demonstrate improved adherence to prescribed treatment plan for fall prevention as evidenced by reported fewer falls Interventions:  . Collaboration with [name], Rayford Halsted, MD regarding development and update of comprehensive plan of care as evidenced by provider attestation and co-signature . Inter-disciplinary care team collaboration (see longitudinal plan of care) . Provided written and verbal education re: Potential causes of falls and Fall prevention strategies . Reviewed medications and discussed potential side effects of medications such as dizziness and frequent urination . Assessed for s/s of orthostatic hypotension . Assessed for falls since last encounter. . Assessed patients knowledge of fall risk prevention secondary to previously provided education. . Assessed working status of life alert bracelet and patient adherence . Evaluation of current treatment plan related to fall safety and patient's adherence to plan as established by provider. . Advised patient to use walker at all times . Provided education to patient re: fall prevention, safety and osteoporosis  . Provided patient with  written educational materials related to fall prevention and safety . Discussed plans with patient for ongoing care management follow up and provided patient with direct contact information for care management team Self-Care Deficits:  Does not adhere to provider recommendations re:  Unable to perform IADLs independently Patient Goals:  - Utilize walker (assistive device) appropriately with all ambulation - De-clutter walkways - Change positions slowly - Wear secure fitting shoes at all times with ambulation - Utilize home lighting for dim lit areas - Demonstrate self and pet awareness at all times - always use handrails on the stairs - always wear shoes or slippers with non-slip sole - get at least 10 minutes of activity every day - install bathroom grab bars - keep a flashlight by the bed - keep cell phone with me always - make an emergency alert plan in case I fall - pick up clutter from the floors - remove, or use a non-slip pad, with my throw rugs - use a cane or walker at all times - use a nightlight in the bathroom Follow Up Plan: Telephone follow up appointment with care management team member scheduled for: 09/25/20 at 10 AM   Patient Care Plan: RNCM: Parkinson's disease    Problem Identified: Lack of coping skills for Parkinson's disease   Priority: Medium    Long-Range Goal: Improved coping skills for Parkinson's disease   Start Date: 08/27/2020  Expected End Date: 11/27/2020  This Visit's Progress: On track  Priority: Medium  Note:   Current Barriers:   Ineffective Self Health Maintenance for Parkinson's disease   Does not adhere to provider recommendations re:   Unable to perform IADLs independently  Pt has had issues with frequent falls and coping with her disease, she is followed by counselor regularly, She [facility] [employer] [facility] 1-2 times a week for Parkinson's exercise class, rides exercise bike and does weights, reports she has not been using her walker at all times   Clinical Goal(s):  Marland Kitchen Collaboration with [name], Rayford Halsted, MD regarding development and update of comprehensive plan of care as evidenced by provider attestation and co-signature . Inter-disciplinary care team collaboration (see longitudinal plan of care)  patient will work with care management team to address care coordination and chronic disease management  needs related to Disease Management  Educational Needs   Interventions:   Evaluation of current treatment plan related to  Parkinson's disease , ADL IADL limitations and Mental Health Concerns  self-management and patient's adherence to plan as established by provider.  Collaboration with [name], Rayford Halsted, MD regarding development and update of comprehensive plan of care as evidenced by provider attestation       and co-signature  Inter-disciplinary care team collaboration (see longitudinal plan of care)  Discussed plans with patient for ongoing care management follow up and provided patient with direct contact information for care management team  Instructed to continue to follow up with neurology and counselor   Encourged to continue going to gym for Parkinson's classes Self Care Activities:  . Patient verbalizes understanding of plan to improve coping skills and self management of Parkinson's disease . Self administers medications as prescribed . Attends all scheduled provider appointments . Calls provider office for new concerns or questions Patient Goals: - repeat what I heard to make sure I understand - bring a list of my medicines to the visit - speak up when I don't understand - keep a calendar with appointment dates - make a list of family or friends that I can call - learn and use visualization or guided imagery - practice relaxation or meditation daily - start or continue a personal journal - talk about feelings with a friend, family or spiritual advisor Follow Up Plan: Telephone follow up  appointment with care management team member scheduled for: 09/25/20 at 10 AM

## 2020-09-01 ENCOUNTER — Ambulatory Visit (INDEPENDENT_AMBULATORY_CARE_PROVIDER_SITE_OTHER): Payer: Medicare PPO

## 2020-09-01 ENCOUNTER — Other Ambulatory Visit: Payer: Self-pay

## 2020-09-01 ENCOUNTER — Telehealth: Payer: Self-pay | Admitting: Internal Medicine

## 2020-09-01 DIAGNOSIS — E538 Deficiency of other specified B group vitamins: Secondary | ICD-10-CM | POA: Diagnosis not present

## 2020-09-01 MED ORDER — CYANOCOBALAMIN 1000 MCG/ML IJ SOLN
1000.0000 ug | Freq: Once | INTRAMUSCULAR | Status: AC
  Administered 2020-09-01: 1000 ug via INTRAMUSCULAR

## 2020-09-01 NOTE — Telephone Encounter (Signed)
Patient is unsure if she has to keep getting her B12 shots.  She didn't know if she needed another appt or not.  She is requesting a call.

## 2020-09-01 NOTE — Telephone Encounter (Signed)
If patient has completed her 1 time a week for a month, then she should return once a month until she sees Dr Ardyth Harps.   Left message on machine for patient to return our call.

## 2020-09-01 NOTE — Progress Notes (Signed)
Patient given vitamin B12 injection, tolerated well.

## 2020-09-03 ENCOUNTER — Encounter: Payer: Self-pay | Admitting: Adult Health

## 2020-09-03 ENCOUNTER — Ambulatory Visit (INDEPENDENT_AMBULATORY_CARE_PROVIDER_SITE_OTHER): Payer: Medicare PPO | Admitting: Adult Health

## 2020-09-03 ENCOUNTER — Encounter: Payer: Self-pay | Admitting: Internal Medicine

## 2020-09-03 ENCOUNTER — Ambulatory Visit (INDEPENDENT_AMBULATORY_CARE_PROVIDER_SITE_OTHER): Payer: Medicare PPO | Admitting: Internal Medicine

## 2020-09-03 ENCOUNTER — Other Ambulatory Visit: Payer: Self-pay

## 2020-09-03 VITALS — BP 94/64 | HR 68 | Temp 97.9°F | Wt 130.3 lb

## 2020-09-03 DIAGNOSIS — F331 Major depressive disorder, recurrent, moderate: Secondary | ICD-10-CM

## 2020-09-03 DIAGNOSIS — R300 Dysuria: Secondary | ICD-10-CM | POA: Diagnosis not present

## 2020-09-03 DIAGNOSIS — F411 Generalized anxiety disorder: Secondary | ICD-10-CM | POA: Diagnosis not present

## 2020-09-03 DIAGNOSIS — N3000 Acute cystitis without hematuria: Secondary | ICD-10-CM

## 2020-09-03 DIAGNOSIS — G4700 Insomnia, unspecified: Secondary | ICD-10-CM

## 2020-09-03 DIAGNOSIS — F29 Unspecified psychosis not due to a substance or known physiological condition: Secondary | ICD-10-CM | POA: Diagnosis not present

## 2020-09-03 DIAGNOSIS — F410 Panic disorder [episodic paroxysmal anxiety] without agoraphobia: Secondary | ICD-10-CM | POA: Diagnosis not present

## 2020-09-03 LAB — POCT URINALYSIS DIPSTICK
Bilirubin, UA: NEGATIVE
Blood, UA: NEGATIVE
Glucose, UA: NEGATIVE
Ketones, UA: NEGATIVE
Nitrite, UA: NEGATIVE
Protein, UA: NEGATIVE
Spec Grav, UA: 1.01 (ref 1.010–1.025)
Urobilinogen, UA: 0.2 E.U./dL
pH, UA: 6 (ref 5.0–8.0)

## 2020-09-03 MED ORDER — SULFAMETHOXAZOLE-TRIMETHOPRIM 800-160 MG PO TABS
1.0000 | ORAL_TABLET | Freq: Two times a day (BID) | ORAL | 0 refills | Status: AC
Start: 2020-09-03 — End: 2020-09-10

## 2020-09-03 NOTE — Progress Notes (Signed)
Established Patient Office Visit     This visit occurred during the SARS-CoV-2 public health emergency.  Safety protocols were in place, including screening questions prior to the visit, additional usage of staff PPE, and extensive cleaning of exam room while observing appropriate contact time as indicated for disinfecting solutions.    CC/Reason for Visit: "I think I have a UTI"  HPI: Kelly Howard is a 70 y.o. female who is coming in today for the above mentioned reasons. For 2 weeks she has been having increased urinary frequency and left flank and suprapubic pain. No dysuria, no fever.  Past Medical/Surgical History: Past Medical History:  Diagnosis Date  . Frequent falls   . Hypothyroidism   . Osteoporosis   . Parkinson's disease [facility])     Past Surgical History:  Procedure Laterality Date  . TONSILLECTOMY    . WRIST SURGERY      Social History:  reports that she has never smoked. She has never used smokeless tobacco. She reports current alcohol use. She reports that she does not use drugs.  Allergies: Allergies  Allergen Reactions  . Codeine Other (See Comments)    GI Upset  . Escitalopram Swelling    Foot swelling  . Propoxyphene Other (See Comments)    Hallucinations    Family History:  Family History  Problem Relation Age of Onset  . Hypercalcemia Mother   . Cancer Paternal Grandmother   . Diabetes Paternal Grandfather      Current Outpatient Medications:  .  Acetaminophen (TYLENOL PO), Take 500 mg by mouth as needed., Disp: , Rfl:  .  AMBULATORY NON FORMULARY MEDICATION, U step walker  Dx: G20, Disp: 1 Device, Rfl: 0 .  AMBULATORY NON FORMULARY MEDICATION, Abdominal compression binder Dx: G20, Disp: 1 Device, Rfl: 0 .  carbidopa-levodopa (SINEMET CR) 50-200 MG tablet, TAKE 1 TABLET BY MOUTH EVERYDAY AT BEDTIME, Disp: 30 tablet, Rfl: 3 .  carbidopa-levodopa (SINEMET IR) 25-100 MG tablet, TAKE 1.5 TABLETS BY MOUTH 5 (FIVE) TIMES DAILY. 1.5  TABLET EVERY 3 HRS, Disp: 675 tablet, Rfl: 1 .  cholecalciferol (VITAMIN D3) 25 MCG (1000 UNIT) tablet, Take 1,000 Units by mouth daily., Disp: , Rfl:  .  clonazePAM (KLONOPIN) 0.5 MG tablet, Take 1 tablet (0.5 mg total) by mouth 3 (three) times daily as needed for anxiety., Disp: 90 tablet, Rfl: 2 .  ibandronate (BONIVA) 150 MG tablet, Take 150 mg by mouth every 30 (thirty) days., Disp: , Rfl:  .  levothyroxine (SYNTHROID, LEVOTHROID) 50 MCG tablet, Take 50 mcg by mouth daily before breakfast., Disp: , Rfl:  .  MELATONIN PO, Take 3 mg by mouth daily., Disp: , Rfl:  .  omeprazole (PRILOSEC) 40 MG capsule, TAKE 1 CAPSULE BY MOUTH EVERY DAY BEFORE DINNER, Disp: 90 capsule, Rfl: 1 .  rasagiline (AZILECT) 1 MG TABS tablet, TAKE 1 TABLET BY MOUTH EVERY DAY, Disp: 90 tablet, Rfl: 1 .  sertraline (ZOLOFT) 100 MG tablet, TAKE ONE AND 1/2 TABLETS DAILY., Disp: 135 tablet, Rfl: 3 .  sulfamethoxazole-trimethoprim (BACTRIM DS) 800-160 MG tablet, Take 1 tablet by mouth 2 (two) times daily for 7 days., Disp: 14 tablet, Rfl: 0 .  Vitamin D, Ergocalciferol, (DRISDOL) 1.25 MG (50000 UNIT) CAPS capsule, Take 1 capsule (50,000 Units total) by mouth every 7 (seven) days for 12 doses., Disp: 12 capsule, Rfl: 0  Review of Systems:  Constitutional: Denies fever, chills, diaphoresis, appetite change and fatigue.  HEENT: Denies photophobia, eye pain, redness, hearing  loss, ear pain, congestion, sore throat, rhinorrhea, sneezing, mouth sores, trouble swallowing, neck pain, neck stiffness and tinnitus.   Respiratory: Denies SOB, DOE, cough, chest tightness,  and wheezing.   Cardiovascular: Denies chest pain, palpitations and leg swelling.  Gastrointestinal: Denies nausea, vomiting, abdominal pain, diarrhea, constipation, blood in stool and abdominal distention.  Genitourinary: Denies dysuria,  Hematuria. Endocrine: Denies: hot or cold intolerance, sweats, changes in hair or nails, polyuria, polydipsia. Musculoskeletal:  Denies myalgias, back pain, joint swelling, arthralgias and gait problem.  Skin: Denies pallor, rash and wound.  Neurological: Denies dizziness, seizures, syncope, weakness, light-headedness, numbness and headaches.  Hematological: Denies adenopathy. Easy bruising, personal or family bleeding history  Psychiatric/Behavioral: Denies suicidal ideation, mood changes, confusion, nervousness, sleep disturbance and agitation    Physical Exam: Vitals:   09/03/20 1604  BP: 94/64  Pulse: 68  Temp: 97.9 F (36.6 C)  TempSrc: Oral  SpO2: 96%  Weight: 130 lb 4.8 oz (59.1 kg)    Body mass index is 21.35 kg/m.   Constitutional: NAD, calm, comfortable Eyes: PERRL, lids and conjunctivae normal ENMT: Mucous membranes are moist.  Psychiatric: Normal judgment and insight. Alert and oriented x 3. Normal mood.    Impression and Plan:  Acute cystitis without hematuria  -In office urine dipstick with leukocytes. -Will send for UA and culture. -Start Bactrim DS for 7 days.   Patient Instructions   -Nice seeing you today!!  -Start bactrim DS 1 tablet twice daily for 7 days.   Urinary Tract Infection, Adult A urinary tract infection (UTI) is an infection of any part of the urinary tract. The urinary tract includes:  The kidneys.  The ureters.  The bladder.  The urethra. These organs make, store, and get rid of pee (urine) in the body. What are the causes? This infection is caused by germs (bacteria) in your genital area. These germs grow and cause swelling (inflammation) of your urinary tract. What increases the risk? The following factors may make you more likely to develop this condition:  Using a small, thin tube (catheter) to drain pee.  Not being able to control when you pee or poop (incontinence).  Being female. If you are female, these things can increase the risk: ? Using these methods to prevent pregnancy:  A medicine that kills sperm (spermicide).  A device that  blocks sperm (diaphragm). ? Having low levels of a female hormone (estrogen). ? Being pregnant. You are more likely to develop this condition if:  You have genes that add to your risk.  You are sexually active.  You take antibiotic medicines.  You have trouble peeing because of: ? A prostate that is bigger than normal, if you are female. ? A blockage in the part of your body that drains pee from the bladder. ? A kidney stone. ? A nerve condition that affects your bladder. ? Not getting enough to drink. ? Not peeing often enough.  You have other conditions, such as: ? Diabetes. ? A weak disease-fighting system (immune system). ? Sickle cell disease. ? Gout. ? Injury of the spine. What are the signs or symptoms? Symptoms of this condition include:  Needing to pee right away.  Peeing small amounts often.  Pain or burning when peeing.  Blood in the pee.  Pee that smells bad or not like normal.  Trouble peeing.  Pee that is cloudy.  Fluid coming from the vagina, if you are female.  Pain in the belly or lower back. Other symptoms include:  Vomiting.  Not feeling hungry.  Feeling mixed up (confused). This may be the first symptom in older adults.  Being tired and grouchy (irritable).  A fever.  Watery poop (diarrhea). How is this treated?  Taking antibiotic medicine.  Taking other medicines.  Drinking enough water. In some cases, you may need to see a specialist. Follow these instructions at home: Medicines  Take over-the-counter and prescription medicines only as told by your doctor.  If you were prescribed an antibiotic medicine, take it as told by your doctor. Do not stop taking it even if you start to feel better. General instructions  Make sure you: ? Pee until your bladder is empty. ? Do not hold pee for a long time. ? Empty your bladder after sex. ? Wipe from front to back after peeing or pooping if you are a female. Use each tissue one time  when you wipe.  Drink enough fluid to keep your pee pale yellow.  Keep all follow-up visits.   Contact a doctor if:  You do not get better after 1-2 days.  Your symptoms go away and then come back. Get help right away if:  You have very bad back pain.  You have very bad pain in your lower belly.  You have a fever.  You have chills.  You feeling like you will vomit or you vomit. Summary  A urinary tract infection (UTI) is an infection of any part of the urinary tract.  This condition is caused by germs in your genital area.  There are many risk factors for a UTI.  Treatment includes antibiotic medicines.  Drink enough fluid to keep your pee pale yellow. This information is not intended to replace advice given to you by your health care provider. Make sure you discuss any questions you have with your health care provider. Document Revised: 12/27/2019 Document Reviewed: 12/27/2019 Elsevier Patient Education  2021 Elsevier Inc.      Chaya Jan, MD [facility]

## 2020-09-03 NOTE — Progress Notes (Signed)
Kelly Howard [id number] 09/12/1950 70 y.o.  Subjective:   Patient ID:  Kelly Howard is a 70 y.o. (DOB 09/01/1950) female.  Chief Complaint: No chief complaint on file.   HPI Kelly Howard presents to the office today for follow-up of GAD, MDD, panic attacks, and insomnia.  Accompanied by friend - helping with medications.  Describes mood today as "ok". Tearful at times.Pleasant. Mood symptoms - denies depression and irritability. Feels anxious Trying to taper off Clonazepam, but still taking some during the day for high anxiety. Has titrated Zoloft to 150mg  daily ad feels like it is helpful .Concerned about "hearing and seeing people". Reports increased urinary frequency over past few days". Reports history of frequent UTI's. Plans to see PCP today at 4 for an assessment. Stable interest and motivation. Taking medications as prescribed. Diagnosed with Parkinson's in 2010.  Energy levels stable. Active, has a regular exercise routine. Enjoys some usual interests and activities. Married. Lives with husband of 46 years. Has 2 daughters - 3 grandchildren. Mother is a 56 years old - visited recently. Spending time with family. Appetite adequate. Weight stable - 131 pounds.  Sleeps better some nights than others. Averages 7 to 8 hours, up and down to the bathroom. Focus and concentration difficulties. Reading - "have picked up some speed". Completing tasks. Managing aspects of household - "getting some things done".   Denies SI or HI.  Denies AH. Positive for VH - every day somewhere. Not scared like I was in the beginning. Seeing 3 to 4 people - paint on them - mardi gras. Working with 2 therapists - one for Parkinson's and one for daughter's issues.   Previous medication trials: Clonazepam, Zoloft, Paxil, Celexa   PHQ2-9   Flowsheet Row Chronic Care Management from 08/27/2020 in [facility] [place] from 08/05/2020 in [facility]  [place] from 05/20/2020 in [facility] [place] from 12/31/2019 in [facility] [place] from 04/11/2019 in [facility] [place] Total Score 4 4 1  0 0  PHQ-9 Total Score 7 8 -- 0 3       Review of Systems:  Review of Systems  Musculoskeletal: Negative for gait problem.  Neurological: Negative for tremors.  Psychiatric/Behavioral:       Please refer to HPI    Medications: I have reviewed the patient's current medications.  Current Outpatient Medications  Medication Sig Dispense Refill  . Acetaminophen (TYLENOL PO) Take 500 mg by mouth as needed.    . AMBULATORY NON FORMULARY MEDICATION U step walker  Dx: G20 1 Device 0  . AMBULATORY NON FORMULARY MEDICATION Abdominal compression binder Dx: G20 1 Device 0  . carbidopa-levodopa (SINEMET CR) 50-200 MG tablet TAKE 1 TABLET BY MOUTH EVERYDAY AT BEDTIME 30 tablet 3  . carbidopa-levodopa (SINEMET IR) 25-100 MG tablet TAKE 1.5 TABLETS BY MOUTH 5 (FIVE) TIMES DAILY. 1.5 TABLET EVERY 3 HRS 675 tablet 1  . cholecalciferol (VITAMIN D3) 25 MCG (1000 UNIT) tablet Take 1,000 Units by mouth daily.    . clonazePAM (KLONOPIN) 0.5 MG tablet Take 1 tablet (0.5 mg total) by mouth 3 (three) times daily as needed for anxiety. 90 tablet 2  . ibandronate (BONIVA) 150 MG tablet Take 150 mg by mouth every 30 (thirty) days.    [employer] levothyroxine (SYNTHROID, LEVOTHROID) 50 MCG tablet Take 50 mcg by mouth daily before breakfast.    . MELATONIN PO Take 3 mg by mouth daily.     omeprazole (PRILOSEC) 40 MG capsule TAKE 1 CAPSULE BY MOUTH EVERY DAY BEFORE DINNER 90 capsule 1  . rasagiline (AZILECT) 1 MG TABS tablet TAKE 1 TABLET BY MOUTH EVERY DAY 90 tablet 1  . sertraline (ZOLOFT) 100 MG tablet TAKE ONE AND 1/2 TABLETS DAILY. 135 tablet 3  . Vitamin D, Ergocalciferol, (DRISDOL) 1.25 MG (50000 UNIT) CAPS capsule Take 1 capsule (50,000 Units total) by mouth every 7 (seven) days for 12  doses. 12 capsule 0   No current facility-administered medications for this visit.    Medication Side Effects: None  Allergies:  Allergies  Allergen Reactions  . Codeine Other (See Comments)    GI Upset  . Escitalopram Swelling    Foot swelling  . Propoxyphene Other (See Comments)    Hallucinations    Past Medical History:  Diagnosis Date  . Frequent falls   . Hypothyroidism   . Osteoporosis   . Parkinson's disease (HCC)     Family History  Problem Relation Age of Onset  . Hypercalcemia Mother   . Cancer Paternal Grandmother   . Diabetes Paternal Grandfather     Social History   Socioeconomic History  . Marital status: Married    Spouse name: Not on file  . Number of children: Not on file  . Years of education: Not on file  . Highest education level: Not on file  Occupational History  . Not on file  Tobacco Use  . Smoking status: Never Smoker  . Smokeless tobacco: Never Used  Vaping Use  . Vaping Use: Never used  Substance and Sexual Activity  . Alcohol use: Yes  . Drug use: Never  . Sexual activity: Not on file  Other Topics Concern  . Not on file  Social History Narrative  . Not on file   Social Determinants of Health   Financial Resource Strain: Not on file  Food Insecurity: No Food Insecurity  . Worried About Programme researcher, broadcasting/film/video in the Last Year: Never true  . Ran Out of Food in the Last Year: Never true  Transportation Needs: No Transportation Needs  . Lack of Transportation (Medical): No  . Lack of Transportation (Non-Medical): No  Physical Activity: Not on file  Stress: Stress Concern Present  . Feeling of Stress : Very much  Social Connections: Not on file  Intimate Partner Violence: Not on file    Past Medical History, Surgical history, Social history, and Family history were reviewed and updated as appropriate.   Please see review of systems for further details on the patient's review from today.   Objective:   Physical Exam:   LMP  (LMP Unknown) Comment: gyn  Physical Exam Constitutional:      General: She is not in acute distress. Musculoskeletal:        General: No deformity.  Neurological:     Mental Status: She is alert and oriented to person, place, and time.     Coordination: Coordination normal.  Psychiatric:        Attention and Perception: Attention and perception normal. She does not perceive auditory or visual hallucinations.        Mood and Affect: Mood normal. Mood is not anxious or depressed. Affect is not labile, blunt, angry or inappropriate.        Speech: Speech normal.        Behavior: Behavior normal.        Thought Content: Thought content normal. Thought content is not paranoid or delusional. Thought content  does not include homicidal or suicidal ideation. Thought content does not include homicidal or suicidal plan.        Cognition and Memory: Cognition and memory normal.        Judgment: Judgment normal.     Comments: Insight intact     Lab Review:     Component Value Date/Time   NA 140 08/05/2020 1050   K 3.9 08/05/2020 1050   CL 102 08/05/2020 1050   CO2 31 08/05/2020 1050   GLUCOSE 95 08/05/2020 1050   BUN 13 08/05/2020 1050   CREATININE 0.65 08/05/2020 1050   CALCIUM 9.3 08/05/2020 1050   PROT 7.0 08/05/2020 1050   ALBUMIN 4.5 08/05/2020 1050   AST 13 08/05/2020 1050   ALT 2 08/05/2020 1050   ALKPHOS 59 08/05/2020 1050   BILITOT 0.7 08/05/2020 1050       Component Value Date/Time   WBC 4.4 08/05/2020 1050   RBC 3.93 08/05/2020 1050   HGB 11.5 (L) 08/05/2020 1050   HCT 34.2 (L) 08/05/2020 1050   PLT 257.0 08/05/2020 1050   MCV 87.1 08/05/2020 1050   MCHC 33.5 08/05/2020 1050   RDW 13.6 08/05/2020 1050   LYMPHSABS 0.8 08/05/2020 1050   MONOABS 0.5 08/05/2020 1050   EOSABS 0.0 08/05/2020 1050   BASOSABS 0.0 08/05/2020 1050    No results found for: POCLITH, LITHIUM   No results found for: PHENYTOIN, PHENOBARB, VALPROATE, CBMZ   .res Assessment: Plan:     Plan:  PDMP reviewed  1. Continue Zoloft 150mg  daily 2. Continue taper off Clonazepam 0.5mg    RTC 4 weeks  Following up with PCP today.   Patient advised to contact office with any questions, adverse effects, or acute worsening in signs and symptoms.  Discussed potential benefits, risk, and side effects of benzodiazepines to include potential risk of tolerance and dependence, as well as possible drowsiness.  Advised patient not to drive if experiencing drowsiness and to take lowest possible effective dose to minimize risk of dependence and tolerance.    Diagnoses and all orders for this visit:  Insomnia, unspecified type  Panic attacks  Generalized anxiety disorder  Major depressive disorder, recurrent episode, moderate (HCC)  Psychosis, unspecified psychosis type (HCC)     Please see After Visit Summary for patient specific instructions.  Future Appointments  Date Time Provider Department Center  09/03/2020  4:00 PM 11/03/2020, Philip Aspen, MD [facility]  09/25/2020 10:00 AM [facility]  10/23/2020 10:00 AM [facility]  11/12/2020  1:30 PM Tat, [name] S, DO [facility] None    No orders of the defined types were placed in this encounter.   -------------------------------

## 2020-09-03 NOTE — Patient Instructions (Signed)
-Nice seeing you today!!  -Start bactrim DS 1 tablet twice daily for 7 days.   Urinary Tract Infection, Adult A urinary tract infection (UTI) is an infection of any part of the urinary tract. The urinary tract includes:  The kidneys.  The ureters.  The bladder.  The urethra. These organs make, store, and get rid of pee (urine) in the body. What are the causes? This infection is caused by germs (bacteria) in your genital area. These germs grow and cause swelling (inflammation) of your urinary tract. What increases the risk? The following factors may make you more likely to develop this condition:  Using a small, thin tube (catheter) to drain pee.  Not being able to control when you pee or poop (incontinence).  Being female. If you are female, these things can increase the risk: ? Using these methods to prevent pregnancy:  A medicine that kills sperm (spermicide).  A device that blocks sperm (diaphragm). ? Having low levels of a female hormone (estrogen). ? Being pregnant. You are more likely to develop this condition if:  You have genes that add to your risk.  You are sexually active.  You take antibiotic medicines.  You have trouble peeing because of: ? A prostate that is bigger than normal, if you are female. ? A blockage in the part of your body that drains pee from the bladder. ? A kidney stone. ? A nerve condition that affects your bladder. ? Not getting enough to drink. ? Not peeing often enough.  You have other conditions, such as: ? Diabetes. ? A weak disease-fighting system (immune system). ? Sickle cell disease. ? Gout. ? Injury of the spine. What are the signs or symptoms? Symptoms of this condition include:  Needing to pee right away.  Peeing small amounts often.  Pain or burning when peeing.  Blood in the pee.  Pee that smells bad or not like normal.  Trouble peeing.  Pee that is cloudy.  Fluid coming from the vagina, if you are  female.  Pain in the belly or lower back. Other symptoms include:  Vomiting.  Not feeling hungry.  Feeling mixed up (confused). This may be the first symptom in older adults.  Being tired and grouchy (irritable).  A fever.  Watery poop (diarrhea). How is this treated?  Taking antibiotic medicine.  Taking other medicines.  Drinking enough water. In some cases, you may need to see a specialist. Follow these instructions at home: Medicines  Take over-the-counter and prescription medicines only as told by your doctor.  If you were prescribed an antibiotic medicine, take it as told by your doctor. Do not stop taking it even if you start to feel better. General instructions  Make sure you: ? Pee until your bladder is empty. ? Do not hold pee for a long time. ? Empty your bladder after sex. ? Wipe from front to back after peeing or pooping if you are a female. Use each tissue one time when you wipe.  Drink enough fluid to keep your pee pale yellow.  Keep all follow-up visits.   Contact a doctor if:  You do not get better after 1-2 days.  Your symptoms go away and then come back. Get help right away if:  You have very bad back pain.  You have very bad pain in your lower belly.  You have a fever.  You have chills.  You feeling like you will vomit or you vomit. Summary  A urinary tract infection (  UTI) is an infection of any part of the urinary tract.  This condition is caused by germs in your genital area.  There are many risk factors for a UTI.  Treatment includes antibiotic medicines.  Drink enough fluid to keep your pee pale yellow. This information is not intended to replace advice given to you by your health care provider. Make sure you discuss any questions you have with your health care provider. Document Revised: 12/27/2019 Document Reviewed: 12/27/2019 Elsevier Patient Education  2021 [employer].

## 2020-09-04 ENCOUNTER — Telehealth: Payer: Self-pay | Admitting: Adult Health

## 2020-09-04 LAB — URINALYSIS, ROUTINE W REFLEX MICROSCOPIC
Bilirubin Urine: NEGATIVE
Ketones, ur: NEGATIVE
Nitrite: NEGATIVE
Specific Gravity, Urine: <=1.005 — AB (ref 1.000–1.030)
Total Protein, Urine: NEGATIVE
Urine Glucose: NEGATIVE
Urobilinogen, UA: 0.2 (ref 0.0–1.0)
pH: 5.5 (ref 5.0–8.0)

## 2020-09-04 LAB — URINE CULTURE
MICRO NUMBER:: 11743499
SPECIMEN QUALITY:: ADEQUATE

## 2020-09-04 NOTE — Telephone Encounter (Signed)
FYI

## 2020-09-04 NOTE — Telephone Encounter (Signed)
Kelly Howard called and LM stating that she went to the doctor and leukocytes were found in her urine and she has a UTI. She was started on Bactrim and wanted RM to be aware of the situation. She will call back with any new updates.

## 2020-09-04 NOTE — Telephone Encounter (Signed)
Noted  

## 2020-09-09 DIAGNOSIS — Z124 Encounter for screening for malignant neoplasm of cervix: Secondary | ICD-10-CM | POA: Diagnosis not present

## 2020-09-09 DIAGNOSIS — M8588 Other specified disorders of bone density and structure, other site: Secondary | ICD-10-CM | POA: Diagnosis not present

## 2020-09-09 DIAGNOSIS — N958 Other specified menopausal and perimenopausal disorders: Secondary | ICD-10-CM | POA: Diagnosis not present

## 2020-09-09 DIAGNOSIS — Z6820 Body mass index (BMI) 20.0-20.9, adult: Secondary | ICD-10-CM | POA: Diagnosis not present

## 2020-09-18 DIAGNOSIS — H10413 Chronic giant papillary conjunctivitis, bilateral: Secondary | ICD-10-CM | POA: Diagnosis not present

## 2020-09-25 ENCOUNTER — Ambulatory Visit (INDEPENDENT_AMBULATORY_CARE_PROVIDER_SITE_OTHER): Payer: Medicare PPO

## 2020-09-25 DIAGNOSIS — M810 Age-related osteoporosis without current pathological fracture: Secondary | ICD-10-CM | POA: Diagnosis not present

## 2020-09-25 DIAGNOSIS — R296 Repeated falls: Secondary | ICD-10-CM

## 2020-09-25 DIAGNOSIS — G20 Parkinson's disease: Secondary | ICD-10-CM

## 2020-09-25 NOTE — Patient Instructions (Signed)
Visit Information  PATIENT GOALS: Goals Addressed            This Visit's Progress   . Manage My Emotions   On track    Timeframe:  Long-Range Goal Priority:  Medium Start Date:      08/27/20                       Expected End Date:       11/27/20                Follow Up Date 11/06/20    - learn and use visualization or guided imagery - practice relaxation or meditation daily - start or continue a personal journal - talk about feelings with a friend, family or spiritual advisor  -continue to work with counselor on stress and anxiety   Why is this important?    When you are stressed, down or upset, your body reacts too.   For example, your blood pressure may get higher; you may have a headache or stomachache.   When your emotions get the best of you, your body's ability to fight off cold and flu gets weak.   These steps will help you manage your emotions.     Notes:     . Prevent Falls and Broken Bones-Osteoporosis   On track    Timeframe:  Long-Range Goal Priority:  High Start Date:     08/27/20                        Expected End Date:   11/27/20                    Follow Up Date 11/06/20    - always use handrails on the stairs - always wear shoes or slippers with non-slip sole - get at least 10 minutes of activity every day - install bathroom grab bars - keep a flashlight by the bed - keep cell phone with me always - make an emergency alert plan in case I fall - pick up clutter from the floors - remove, or use a non-slip pad, with my throw rugs - use a cane or walker - use a nightlight in the bathroom    Why is this important?    When you fall, there are 3 things that control if a bone breaks or not.   These are the fall itself, how hard and the direction that you fall and how fragile your bones are.   Preventing falls is very important for you because of fragile bones.     Notes:       Parkinson's Disease Parkinson's disease is a type of movement  disorder. It is a long-term condition that gets worse over time (is progressive). Each person with Parkinson's disease is affected differently. This condition limits your ability to control movements and move your body normally. The condition can range from mild to severe. Parkinson's disease tends to get worse slowly over several years. What are the causes? Parkinson's disease results from a loss of brain cells (neurons) that make a brain chemical called dopamine. Dopamine is needed to control movement. As the condition gets worse, neurons make less dopamine. This makes it hard to move or control your movements. The exact cause of the loss of neurons and why they make less dopamine is not known. Factors related to genes and the environment may contribute to the cause of Parkinson's disease.  What increases the risk? The following factors may make you more likely to develop this condition:  Being female.  Being age 51 or older.  Having a family history of Parkinson's disease.  Having had a traumatic brain injury.  Having experienced depression.  Having been exposed to toxins, such as pesticides. What are the signs or symptoms? Symptoms of this condition can vary. The main symptoms are related to movement. These include:  A tremor or shaking while you are resting that you cannot control.  Stiffness in your neck, arms, and legs (rigidity).  Slowing of movement. You may lose facial expressions and have trouble making small movements that are needed to button clothing or brush your teeth.  An abnormal walk. You may walk with short, shuffling steps.  Loss of balance and stability when standing. You may sway, fall backward, and have trouble making turns. Other symptoms include:  Mental or cognitive changes including depression, anxiety, having false beliefs (delusions), or seeing, hearing, or feeling things that do not exist (hallucinations).  Trouble speaking or swallowing.  Changes in  bowel or bladder functions including constipation, having to go urgently or frequently, or not being able to control your bowel or bladder.  Changes in sleep habits or trouble sleeping. Parkinson's disease may be graded by severity of your condition as mild, moderate, or advanced. Parkinson's disease progression is different for everyone. You may not progress to the advanced stage.  Mild Parkinson's disease involves: ? Movement problems that do not affect daily activities. ? Movement problems on one side of the body.  Moderate Parkinson's disease involves: ? Movement problems on both sides of the body. ? Slowing of movement. ? Coordination and balance problems.  Advanced Parkinson's disease involves: ? Extreme difficulty walking. ? Inability to live alone safely. ? Signs of dementia, such as having trouble remembering things, doing daily tasks such as getting dressed, and problem solving.   How is this diagnosed? This condition is diagnosed by a specialist. A diagnosis may be made based on symptoms, your medical history, and a physical exam. You may also have brain imaging tests to check for a loss of dopamine-producing areas of the brain. How is this treated? There is no cure for Parkinson's disease. Treatment focuses on managing your symptoms. Treatment may include:  Medicines. Everyone responds to medicines differently. Your response may change over time. Work with your health care provider to find the best medicines for you.  Speech, occupational, and physical therapy.  Deep brain stimulation surgery to reduce tremors and other involuntary movements. Follow these instructions at home: Medicines  Take over-the-counter and prescription medicines only as told by your health care provider.  Avoid taking medicines that can affect thinking, such as pain or sleeping medicines. Eating and drinking  Follow instructions from your health care provider about eating or drinking  restrictions.  Do not drink alcohol. Activity  Talk with your health care provider about if it is safe for you to drive.  Do exercises as told by your health care provider or physical therapist. Lifestyle  Install grab bars and railings in your home to prevent falls.  Do not use any products that contain nicotine or tobacco, such as cigarettes, e-cigarettes, and chewing tobacco. If you need help quitting, ask your health care provider.  Consider joining a support group for people with Parkinson's disease.      General instructions  Work with your health care provider to determine what you need help with and what your safety needs  are.  Keep all follow-up visits as told by your health care provider, including any visits with a physical therapist, speech therapist, or occupational therapist. This is important. Contact a health care provider if:  Medicines do not help your symptoms.  You are unsteady or have fallen at home.  You need more support to function well at home.  You have trouble swallowing.  You have severe constipation.  You are having problems with side effects from your medicines.  You feel confused, anxious, or depressed. Get help right away if you:  Are injured after a fall.  See or hear things that are not real.  Cannot swallow without choking.  Have chest pain or trouble breathing.  Do not feel safe at home.  Have thoughts about hurting yourself or others. If you ever feel like you may hurt yourself or others, or have thoughts about taking your own life, get help right away. You can go to your nearest emergency department or call:  Your local emergency services (911 in the [place]  A suicide crisis helpline, such as the National Suicide Prevention Lifeline at (910)577-0622. This is open 24 hours a day. Summary  Parkinson's disease is a long-term condition that gets worse over time. This condition limits your ability to control your movements and  move your body normally.  There is no cure for Parkinson's disease. Treatment focuses on managing your symptoms.  Work with your health care provider to determine what you need help with and what your safety needs are.  Keep all follow-up visits as told by your health care provider, including any visits with a physical therapist, speech therapist, or occupational therapist. This is important. This information is not intended to replace advice given to you by your health care provider. Make sure you discuss any questions you have with your health care provider. Document Revised: 08/02/2018 Document Reviewed: 08/02/2018 Elsevier Patient Education  2021 [employer]   Patient verbalizes understanding of instructions provided today and agrees to view in [place].   Telephone follow up appointment with care management team member scheduled for: 11/06/20 at 10 AM  Dudley Major RN, [facility] [place], CDE Care Management Coordinator [facility] [phone], Mobile [phone]

## 2020-09-25 NOTE — Chronic Care Management (AMB) (Signed)
Chronic Care Management   CCM RN Visit Note  09/25/2020 Name: Kelly GroutDeborah Lowman Arata MRN: [id number] DOB: 02/10/1951  Subjective: Kelly Howard is a 70 y.o. year old female who is a primary care patient of Philip AspenHernandez Acosta, Limmie PatriciaEstela Y, MD. The care management team was consulted for assistance with disease management and care coordination needs.    Engaged with patient by telephone for follow up visit in response to provider referral for case management and/or care coordination services.   Consent to Services:  The patient was given information about Chronic Care Management services, agreed to services, and gave verbal consent prior to initiation of services.  Please see initial visit note for detailed documentation.   Patient agreed to services and verbal consent obtained.   Assessment: Review of patient past medical history, allergies, medications, health status, including review of consultants reports, laboratory and other test data, was performed as part of comprehensive evaluation and provision of chronic care management services.   SDOH (Social Determinants of Health) assessments and interventions performed:    CCM Care Plan  Allergies  Allergen Reactions  . Codeine Other (See Comments)    GI Upset  . Escitalopram Swelling    Foot swelling  . Propoxyphene Other (See Comments)    Hallucinations    Outpatient Encounter Medications as of 09/25/2020  Medication Sig  . Acetaminophen (TYLENOL PO) Take 500 mg by mouth as needed.  . AMBULATORY NON FORMULARY MEDICATION U step walker  Dx: G20  . AMBULATORY NON FORMULARY MEDICATION Abdominal compression binder Dx: G20  . carbidopa-levodopa (SINEMET CR) 50-200 MG tablet TAKE 1 TABLET BY MOUTH EVERYDAY AT BEDTIME  . carbidopa-levodopa (SINEMET IR) 25-100 MG tablet TAKE 1.5 TABLETS BY MOUTH 5 (FIVE) TIMES DAILY. 1.5 TABLET EVERY 3 HRS  . cholecalciferol (VITAMIN D3) 25 MCG (1000 UNIT) tablet Take 1,000 Units by mouth daily.  .  clonazePAM (KLONOPIN) 0.5 MG tablet Take 1 tablet (0.5 mg total) by mouth 3 (three) times daily as needed for anxiety.  . ibandronate (BONIVA) 150 MG tablet Take 150 mg by mouth every 30 (thirty) days.  Marland Kitchen. levothyroxine (SYNTHROID, LEVOTHROID) 50 MCG tablet Take 50 mcg by mouth daily before breakfast.  . MELATONIN PO Take 3 mg by mouth daily.  Marland Kitchen. omeprazole (PRILOSEC) 40 MG capsule TAKE 1 CAPSULE BY MOUTH EVERY DAY BEFORE DINNER  . rasagiline (AZILECT) 1 MG TABS tablet TAKE 1 TABLET BY MOUTH EVERY DAY  . sertraline (ZOLOFT) 100 MG tablet TAKE ONE AND 1/2 TABLETS DAILY.  Marland Kitchen. Vitamin D, Ergocalciferol, (DRISDOL) 1.25 MG (50000 UNIT) CAPS capsule Take 1 capsule (50,000 Units total) by mouth every 7 (seven) days for 12 doses.   No facility-administered encounter medications on file as of 09/25/2020.    Patient Active Problem List   Diagnosis Date Noted  . Vitamin D deficiency 07/10/2019  . Vitamin B12 deficiency 07/10/2019  . Hypothyroidism   . Frequent falls   . Osteoporosis   . Closed fracture of right distal radius 12/25/2018  . Parkinson disease (HCC) 10/20/2011    Conditions to be addressed/monitored:Parkinson's disease, osteoporosis   Care Plan : RNCM:Osteoporosis (Adult)  Updates made by Yetta GlassmanSandlin, Stellan Vick J, RN since 09/25/2020 12:00 AM    Problem: Risk of harm or Injury related to hx of frequent falls and osteoporosis   Priority: High    Long-Range Goal: Harm or Injury Prevented   Start Date: 08/27/2020  Expected End Date: 11/27/2020  This Visit's Progress: On track  Recent Progress: On track  Priority:  High  Note:   Current Barriers:  Marland Kitchen Knowledge Deficits related to fall precautions in patient with osteoporosis and Parkinson's disease pt has had over 4 falls in last year, reports using walker more but still not always using, reports no falls since last RNCM call, Reports she and her husband have bought a new house that has her bedroom and bathroom on one level with hard surface  flooring on the main level that they will be moving to in August . Decreased adherence to prescribed treatment for fall prevention . Does not adhere to provider recommendations re: use of walker . Does not adhere to prescribed psychotropic medication regimen . Unable to perform IADLs independently-family assists as needed . Knowledge Deficits related to fall prevention and safety . Chronic Disease Management support and education needs related to fall prevention and safety, osteoporosis Clinical Goal(s):  . patient will demonstrate improved adherence to prescribed treatment plan for decreasing falls as evidenced by patient reporting and review of EMR . patient will verbalize using fall risk reduction strategies discussed . patient will not experience additional falls . patient will verbalize understanding of plan for fall prevention and safety . patient will meet with RN Care Manager to address educational needs for fall prevention and osteoporosis . patient will attend all scheduled medical appointments: no scheduled primary care visit, neurology 11/12/20 . patient will demonstrate improved adherence to prescribed treatment plan for fall prevention as evidenced by reported fewer falls Interventions:  . Collaboration with Philip Aspen, Limmie Patricia, MD regarding development and update of comprehensive plan of care as evidenced by provider attestation and co-signature . Inter-disciplinary care team collaboration (see longitudinal plan of care) . Provided written and verbal education re: Potential causes of falls and Fall prevention strategies . Reviewed medications and discussed potential side effects of medications such as dizziness and frequent urination . Assessed for falls since last encounter. . Reinforced fall risk prevention secondary to previously provided education. . Evaluation of current treatment plan related to fall safety and patient's adherence to plan as established by  provider. . Reinforced to use walker at all times . Reinforced education to patient re: fall prevention, safety and osteoporosis  . Reviewed scheduled/upcoming provider appointments including: Dr. Arbutus Leas 11/12/20 no current primary care provider appt scheduled . Discussed plans with patient for ongoing care management follow up and provided patient with direct contact information for care management team Self-Care Deficits:  Does not adhere to provider recommendations re: use of walker Unable to perform IADLs independently Patient Goals:  - Utilize walker (assistive device) appropriately with all ambulation - De-clutter walkways - Change positions slowly - Wear secure fitting shoes at all times with ambulation - Utilize home lighting for dim lit areas - Demonstrate self and pet awareness at all times - always use handrails on the stairs - always wear shoes or slippers with non-slip sole - get at least 10 minutes of activity every day - install bathroom grab bars - keep a flashlight by the bed - keep cell phone with me always - make an emergency alert plan in case I fall - pick up clutter from the floors - remove, or use a non-slip pad, with my throw rugs - use a cane or walker at all times - use a nightlight in the bathroom Follow Up Plan: Telephone follow up appointment with care management team member scheduled for: 11/06/20 at 10 AM The patient has been provided with contact information for the care management team and has been advised  to call with any health related questions or concerns.  11/06/20 at 10 AM   Care Plan : RNCM: Parkinson's disease  Updates made by Yetta Glassman, RN since 09/25/2020 12:00 AM    Problem: Lack of coping skills for Parkinson's disease   Priority: Medium    Long-Range Goal: Improved coping skills for Parkinson's disease   Start Date: 08/27/2020  Expected End Date: 11/27/2020  This Visit's Progress: On track  Recent Progress: On track  Priority:  Medium  Note:   Current Barriers:   Ineffective Self Health Maintenance for Parkinson's disease   Does not adhere to provider recommendations re:   Unable to perform IADLs independently  Pt has had issues with frequent falls and coping with her disease, she is seeing her  counselor regularly, She continues to go to [employer] 1-2 times a week for Parkinson's exercise class, rides exercise bike and does weights, reports she has not been using her walker at all times, pt reports she had a UTI early in the month that she saw Dr. Ardyth Harps and was given an antibiotic which she completed, states her UTI symptoms are gone now, reports no falls since last RNCM call Clinical Goal(s):  Marland Kitchen Collaboration with Philip Aspen, Limmie Patricia, MD regarding development and update of comprehensive plan of care as evidenced by provider attestation and co-signature . Inter-disciplinary care team collaboration (see longitudinal plan of care)  patient will work with care management team to address care coordination and chronic disease management needs related to Disease Management  Educational Needs   Interventions:   Evaluation of current treatment plan related to  Parkinson's disease , ADL IADL limitations and Mental Health Concerns  self-management and patient's adherence to plan as established by provider.  Collaboration with Philip Aspen, Limmie Patricia, MD regarding development and update of comprehensive plan of care as evidenced by provider attestation       and co-signature  Inter-disciplinary care team collaboration (see longitudinal plan of care)  Discussed plans with patient for ongoing care management follow up and provided patient with direct contact information for care management team  Reinforced to continue to follow up with neurology and counselor   Encourged to continue going to gym for Parkinson's classes  Instructed on s/sx of UTI to call provider and encourged to empty bladder frequently and  to drink adequate amounts of fluids Self Care Activities:  . Patient verbalizes understanding of plan to improve coping skills and self management of Parkinson's disease . Self administers medications as prescribed . Attends all scheduled provider appointments . Calls provider office for new concerns or questions Patient Goals: - repeat what I heard to make sure I understand - bring a list of my medicines to the visit - speak up when I don't understand - keep a calendar with appointment dates - make a list of family or friends that I can call - learn and use visualization or guided imagery - practice relaxation or meditation daily - start or continue a personal journal - talk about feelings with a friend, family or spiritual advisor -use walker at all times Follow Up Plan: Telephone follow up appointment with care management team member scheduled for: 11/06/20 at 10 AM The patient has been provided with contact information for the care management team and has been advised to call with any health related questions or concerns.       Plan:Telephone follow up appointment with care management team member scheduled for:  11/06/20 and The patient has  been provided with contact information for the care management team and has been advised to call with any health related questions or concerns.  Dudley Major RN, Maxim[place] C[place], CDE Care Management Coordinator [facility] [phone], Mobile [phone]

## 2020-09-29 ENCOUNTER — Encounter: Payer: Self-pay | Admitting: Family Medicine

## 2020-09-29 ENCOUNTER — Other Ambulatory Visit: Payer: Self-pay

## 2020-09-29 ENCOUNTER — Ambulatory Visit: Payer: Medicare PPO | Admitting: Family Medicine

## 2020-09-29 VITALS — BP 134/64 | HR 78 | Temp 97.9°F | Wt 130.3 lb

## 2020-09-29 DIAGNOSIS — J029 Acute pharyngitis, unspecified: Secondary | ICD-10-CM

## 2020-09-29 DIAGNOSIS — R0982 Postnasal drip: Secondary | ICD-10-CM | POA: Diagnosis not present

## 2020-09-29 NOTE — Patient Instructions (Signed)
Recommend over the counter Loratadine (or Allegra) and would also add Flonase or Nasacort AQ once daily.

## 2020-09-29 NOTE — Progress Notes (Signed)
Established Patient Office Visit  Subjective:  Patient ID: Kelly Howard, female    DOB: September 26, 1950  Age: 70 y.o. MRN: [id number]  CC:  Chief Complaint  Patient presents with  . Sore Throat    X 3-4 weeks, seen ENT for this, complains of SOB and fatigue    HPI Kelly Howard presents for sore throat.  Initially there was impression this was a for a few weeks but she states this has actually been for several months.  Couple months ago she saw ENT and had nasolaryngoscopy.  There was question of GERD related issues and she has been on omeprazole 40 mg daily.  She does have frequent postnasal drip and thinks some of this may be allergy related.  Currently not taking anything for allergies.  No fevers or chills.  No adenopathy.  No appetite or weight changes.  No swallowing difficulties.  Her chronic problems include history of Parkinson's disease, hypothyroidism, osteoporosis.  She is on omeprazole  Past Medical History:  Diagnosis Date  . Frequent falls   . Hypothyroidism   . Osteoporosis   . Parkinson's disease [facility])     Past Surgical History:  Procedure Laterality Date  . TONSILLECTOMY    . WRIST SURGERY      Family History  Problem Relation Age of Onset  . Hypercalcemia Mother   . Cancer Paternal Grandmother   . Diabetes Paternal Grandfather     Social History   Socioeconomic History  . Marital status: Married    Spouse name: Not on file  . Number of children: Not on file  . Years of education: Not on file  . Highest education level: Not on file  Occupational History  . Not on file  Tobacco Use  . Smoking status: Never Smoker  . Smokeless tobacco: Never Used  Vaping Use  . Vaping Use: Never used  Substance and Sexual Activity  . Alcohol use: Yes  . Drug use: Never  . Sexual activity: Not on file  Other Topics Concern  . Not on file  Social History Narrative  . Not on file   Social Determinants of Health   Financial Resource Strain: Not  on file  Food Insecurity: No Food Insecurity  . Worried About Programme researcher, broadcasting/film/video in the Last Year: Never true  . Ran Out of Food in the Last Year: Never true  Transportation Needs: No Transportation Needs  . Lack of Transportation (Medical): No  . Lack of Transportation (Non-Medical): No  Physical Activity: Not on file  Stress: Stress Concern Present  . Feeling of Stress : Very much  Social Connections: Not on file  Intimate Partner Violence: Not on file    Outpatient Medications Prior to Visit  Medication Sig Dispense Refill  . Acetaminophen (TYLENOL PO) Take 500 mg by mouth as needed.    . AMBULATORY NON FORMULARY MEDICATION U step walker  Dx: G20 1 Device 0  . AMBULATORY NON FORMULARY MEDICATION Abdominal compression binder Dx: G20 1 Device 0  . carbidopa-levodopa (SINEMET CR) 50-200 MG tablet TAKE 1 TABLET BY MOUTH EVERYDAY AT BEDTIME 30 tablet 3  . carbidopa-levodopa (SINEMET IR) 25-100 MG tablet TAKE 1.5 TABLETS BY MOUTH 5 (FIVE) TIMES DAILY. 1.5 TABLET EVERY 3 HRS 675 tablet 1  . cholecalciferol (VITAMIN D3) 25 MCG (1000 UNIT) tablet Take 1,000 Units by mouth daily.    . clonazePAM (KLONOPIN) 0.5 MG tablet Take 1 tablet (0.5 mg total) by mouth 3 (three) times daily as  needed for anxiety. 90 tablet 2  . ibandronate (BONIVA) 150 MG tablet Take 150 mg by mouth every 30 (thirty) days.    Marland Kitchen levothyroxine (SYNTHROID, LEVOTHROID) 50 MCG tablet Take 50 mcg by mouth daily before breakfast.    . MELATONIN PO Take 3 mg by mouth daily.    Marland Kitchen omeprazole (PRILOSEC) 40 MG capsule TAKE 1 CAPSULE BY MOUTH EVERY DAY BEFORE DINNER 90 capsule 1  . rasagiline (AZILECT) 1 MG TABS tablet TAKE 1 TABLET BY MOUTH EVERY DAY 90 tablet 1  . sertraline (ZOLOFT) 100 MG tablet TAKE ONE AND 1/2 TABLETS DAILY. 135 tablet 3  . Vitamin D, Ergocalciferol, (DRISDOL) 1.25 MG (50000 UNIT) CAPS capsule Take 1 capsule (50,000 Units total) by mouth every 7 (seven) days for 12 doses. 12 capsule 0   No  facility-administered medications prior to visit.    Allergies  Allergen Reactions  . Codeine Other (See Comments)    GI Upset  . Escitalopram Swelling    Foot swelling  . Propoxyphene Other (See Comments)    Hallucinations    ROS Review of Systems  Constitutional: Negative for chills and fever.  HENT: Positive for congestion and postnasal drip. Negative for trouble swallowing.   Respiratory: Negative for cough and shortness of breath.   Hematological: Negative for adenopathy.      Objective:    Physical Exam Vitals reviewed.  Constitutional:      General: She is not in acute distress.    Appearance: She is well-developed. She is not ill-appearing or toxic-appearing.  HENT:     Mouth/Throat:     Mouth: Mucous membranes are moist. No oral lesions.     Pharynx: Oropharynx is clear. No oropharyngeal exudate or posterior oropharyngeal erythema.  Cardiovascular:     Rate and Rhythm: Normal rate and regular rhythm.  Pulmonary:     Effort: Pulmonary effort is normal.     Breath sounds: Normal breath sounds.  Neurological:     Mental Status: She is alert.     BP 134/64 (BP Location: Left Arm, Patient Position: Sitting, Cuff Size: Normal)   Pulse 78   Temp 97.9 F (36.6 C) (Oral)   Wt 130 lb 4.8 oz (59.1 kg)   LMP  (LMP Unknown) Comment: gyn  SpO2 97%   BMI 21.35 kg/m  Wt Readings from Last 3 Encounters:  09/29/20 130 lb 4.8 oz (59.1 kg)  09/03/20 130 lb 4.8 oz (59.1 kg)  08/05/20 131 lb (59.4 kg)     Health Maintenance Due  Topic Date Due  . Hepatitis C Screening  Never done  . MAMMOGRAM  10/29/2019    There are no preventive care reminders to display for this patient.  Lab Results  Component Value Date   TSH 0.98 08/05/2020   Lab Results  Component Value Date   WBC 4.4 08/05/2020   HGB 11.5 (L) 08/05/2020   HCT 34.2 (L) 08/05/2020   MCV 87.1 08/05/2020   PLT 257.0 08/05/2020   Lab Results  Component Value Date   NA 140 08/05/2020   K 3.9  08/05/2020   CO2 31 08/05/2020   GLUCOSE 95 08/05/2020   BUN 13 08/05/2020   CREATININE 0.65 08/05/2020   BILITOT 0.7 08/05/2020   ALKPHOS 59 08/05/2020   AST 13 08/05/2020   ALT 2 08/05/2020   PROT 7.0 08/05/2020   ALBUMIN 4.5 08/05/2020   CALCIUM 9.3 08/05/2020   GFR 89.09 08/05/2020   Lab Results  Component Value Date  CHOL 191 08/05/2020   Lab Results  Component Value Date   HDL 70.00 08/05/2020   Lab Results  Component Value Date   LDLCALC 104 (H) 08/05/2020   Lab Results  Component Value Date   TRIG 83.0 08/05/2020   Lab Results  Component Value Date   CHOLHDL 3 08/05/2020   Lab Results  Component Value Date   HGBA1C 5.8 08/05/2020      Assessment & Plan:   Patient relates persistent sore throat symptoms for several months.  Had recent nasolaryngoscopy which was unrevealing.  There has been some question of reflux but she is not responding with PPI with omeprazole.  It does sound like she is having some postnasal drip symptoms which may be allergy related.  She is not describing any acute or chronic sinusitis symptoms No red flags such as adenopathy, weight loss, etc.  -Start over-the-counter Claritin or Allegra 1 daily.  Also suggested over-the-counter Flonase or Nasacort AQ and follow-up with primary if not improving over the next couple weeks  No orders of the defined types were placed in this encounter.   Follow-up: No follow-ups on file.    Evelena Peat, MD

## 2020-10-06 DIAGNOSIS — Z1231 Encounter for screening mammogram for malignant neoplasm of breast: Secondary | ICD-10-CM | POA: Diagnosis not present

## 2020-10-08 ENCOUNTER — Other Ambulatory Visit: Payer: Self-pay | Admitting: Neurology

## 2020-10-09 ENCOUNTER — Telehealth: Payer: Self-pay | Admitting: Adult Health

## 2020-10-09 NOTE — Telephone Encounter (Signed)
Noted  

## 2020-10-09 NOTE — Telephone Encounter (Signed)
Shes taking Zoloft 150mg  currently. May increase to 200mg  if needed. Uncertain why she needs a different antidepressant.

## 2020-10-09 NOTE — Telephone Encounter (Signed)
Kelly Howard called in today with concerns about Clonazepam. States that doctor advised her to stop taking it and she has since been weaning off. She was making progress until some things happened, and she began taking more. She has been experiencing shakiness and nervousness. Her doctor has advised her to be put on another anti depressant. Pls RTC [phone]

## 2020-10-09 NOTE — Telephone Encounter (Signed)
Rtc to pt and her husband, so sweet. The message was not clear. They were calling to report she is decreasing her Clonazepam per Dr. Arbutus Leas recommendation- she's taking up to 1.5 tabs daily. She is currently also taking Sertraline 150 mg. They are in the process of moving after living in their house for 34 years, so you can imagine the stress. They are moving into a home more accessible for pt with parkinson's. Since they are dealing with so much currently advised her husband to have her increase sertraline to 200 mg if needed but hold off going down on Clonazepam at this time. Things are extremely stressful with the move and she is doing the best she can per her husband. He was very appreciative of the call and will contact us again if needing anything else.

## 2020-10-09 NOTE — Telephone Encounter (Signed)
Please review

## 2020-10-20 DIAGNOSIS — H52203 Unspecified astigmatism, bilateral: Secondary | ICD-10-CM | POA: Diagnosis not present

## 2020-10-20 DIAGNOSIS — H2513 Age-related nuclear cataract, bilateral: Secondary | ICD-10-CM | POA: Diagnosis not present

## 2020-10-22 ENCOUNTER — Other Ambulatory Visit: Payer: Self-pay | Admitting: Internal Medicine

## 2020-10-22 DIAGNOSIS — E559 Vitamin D deficiency, unspecified: Secondary | ICD-10-CM

## 2020-10-23 ENCOUNTER — Other Ambulatory Visit: Payer: Medicare PPO

## 2020-10-23 ENCOUNTER — Other Ambulatory Visit: Payer: Self-pay | Admitting: Internal Medicine

## 2020-10-23 DIAGNOSIS — E559 Vitamin D deficiency, unspecified: Secondary | ICD-10-CM

## 2020-10-27 ENCOUNTER — Telehealth: Payer: Self-pay | Admitting: Internal Medicine

## 2020-10-27 NOTE — Telephone Encounter (Signed)
Pt is requesting an x-ray. Asap for her shoulder. The pt fell on 05/30; she is in a lot of pain; she has an appointment scheduled for 10/28/2020 wants to know if Dr. Ardyth Harps today  .   she has already spoken to the triage nurse.

## 2020-10-27 NOTE — Telephone Encounter (Signed)
Left message on machine for patient to return our call 

## 2020-10-27 NOTE — Telephone Encounter (Signed)
She is on your schedule tomorrow.  Would you like for her to go to [facility] for an xray  Today?

## 2020-10-28 ENCOUNTER — Ambulatory Visit: Payer: Medicare PPO | Admitting: Internal Medicine

## 2020-10-28 ENCOUNTER — Encounter: Payer: Self-pay | Admitting: Internal Medicine

## 2020-10-28 ENCOUNTER — Other Ambulatory Visit: Payer: Self-pay

## 2020-10-28 VITALS — BP 96/64 | HR 83 | Temp 98.5°F | Wt 131.5 lb

## 2020-10-28 DIAGNOSIS — M79601 Pain in right arm: Secondary | ICD-10-CM

## 2020-10-28 DIAGNOSIS — R296 Repeated falls: Secondary | ICD-10-CM

## 2020-10-28 DIAGNOSIS — M25511 Pain in right shoulder: Secondary | ICD-10-CM | POA: Diagnosis not present

## 2020-10-28 DIAGNOSIS — R82998 Other abnormal findings in urine: Secondary | ICD-10-CM

## 2020-10-28 DIAGNOSIS — G20 Parkinson's disease: Secondary | ICD-10-CM

## 2020-10-28 DIAGNOSIS — G20A1 Parkinson's disease without dyskinesia, without mention of fluctuations: Secondary | ICD-10-CM

## 2020-10-28 LAB — POCT URINALYSIS DIPSTICK
Bilirubin, UA: NEGATIVE
Blood, UA: NEGATIVE
Glucose, UA: NEGATIVE
Ketones, UA: 5
Leukocytes, UA: NEGATIVE
Nitrite, UA: NEGATIVE
Protein, UA: POSITIVE — AB
Spec Grav, UA: 1.015 (ref 1.010–1.025)
Urobilinogen, UA: 0.2 E.U./dL
pH, UA: 6 (ref 5.0–8.0)

## 2020-10-28 NOTE — Addendum Note (Signed)
Addended by: Kern Reap B on: 10/28/2020 03:21 PM   Modules accepted: Orders

## 2020-10-28 NOTE — Telephone Encounter (Signed)
Patient scheduled an office visit 10/28/20.

## 2020-10-28 NOTE — Progress Notes (Signed)
Acute office Visit     This visit occurred during the SARS-CoV-2 public health emergency.  Safety protocols were in place, including screening questions prior to the visit, additional usage of staff PPE, and extensive cleaning of exam room while observing appropriate contact time as indicated for disinfecting solutions.    CC/Reason for Visit: Discuss acute concerns  HPI: Kelly Howard is a 71 y.o. female who is coming in today for the above mentioned reasons.  She is here today to discuss 2 main issues:  1.  She fell 5 days ago at home.  She has a history of frequent falls due to her history of Parkinson's.  She is supposed to be tapering off her clonazepam, however it appears that she was represcribed it after her last psychiatry visit.  She was at home in the kitchen, she fell onto her right side hitting the stove.  She has bruising around her upper arm/right shoulder area.  She wonders if she needs an x-ray to rule out a fracture.  2.  She has noticed dark-colored urine and increased urine frequency.  Past Medical/Surgical History: Past Medical History:  Diagnosis Date  . Frequent falls   . Hypothyroidism   . Osteoporosis   . Parkinson's disease [facility])     Past Surgical History:  Procedure Laterality Date  . TONSILLECTOMY    . WRIST SURGERY      Social History:  reports that she has never smoked. She has never used smokeless tobacco. She reports current alcohol use. She reports that she does not use drugs.  Allergies: Allergies  Allergen Reactions  . Codeine Other (See Comments)    GI Upset  . Escitalopram Swelling    Foot swelling  . Propoxyphene Other (See Comments)    Hallucinations    Family History:  Family History  Problem Relation Age of Onset  . Hypercalcemia Mother   . Cancer Paternal Grandmother   . Diabetes Paternal Grandfather      Current Outpatient Medications:  .  Acetaminophen (TYLENOL PO), Take 500 mg by mouth as needed.,  Disp: , Rfl:  .  AMBULATORY NON FORMULARY MEDICATION, U step walker  Dx: G20, Disp: 1 Device, Rfl: 0 .  AMBULATORY NON FORMULARY MEDICATION, Abdominal compression binder Dx: G20, Disp: 1 Device, Rfl: 0 .  carbidopa-levodopa (SINEMET CR) 50-200 MG tablet, TAKE 1 TABLET BY MOUTH EVERYDAY AT BEDTIME, Disp: 30 tablet, Rfl: 0 .  carbidopa-levodopa (SINEMET IR) 25-100 MG tablet, TAKE 1.5 TABLETS BY MOUTH 5 (FIVE) TIMES DAILY. 1.5 TABLET EVERY 3 HRS, Disp: 675 tablet, Rfl: 1 .  clonazePAM (KLONOPIN) 0.5 MG tablet, Take 1 tablet (0.5 mg total) by mouth 3 (three) times daily as needed for anxiety., Disp: 90 tablet, Rfl: 2 .  ibandronate (BONIVA) 150 MG tablet, Take 150 mg by mouth every 30 (thirty) days., Disp: , Rfl:  .  levothyroxine (SYNTHROID, LEVOTHROID) 50 MCG tablet, Take 50 mcg by mouth daily before breakfast., Disp: , Rfl:  .  sertraline (ZOLOFT) 100 MG tablet, TAKE ONE AND 1/2 TABLETS DAILY., Disp: 135 tablet, Rfl: 3  Review of Systems:  Constitutional: Denies fever, chills, diaphoresis, appetite change and fatigue.  HEENT: Denies photophobia, eye pain, redness, hearing loss, ear pain, congestion, sore throat, rhinorrhea, sneezing, mouth sores, trouble swallowing, neck pain, neck stiffness and tinnitus.   Respiratory: Denies SOB, DOE, cough, chest tightness,  and wheezing.   Cardiovascular: Denies chest pain, palpitations and leg swelling.  Gastrointestinal: Denies nausea, vomiting, abdominal pain,  diarrhea, constipation, blood in stool and abdominal distention.  Genitourinary: Denies dysuria,  hematuria, flank pain and difficulty urinating.  Endocrine: Denies: hot or cold intolerance, sweats, changes in hair or nails, polyuria, polydipsia. Musculoskeletal: Denies myalgias, back pain, joint swelling.  Skin: Denies pallor, rash and wound.  Neurological: Denies dizziness, seizures, syncope, weakness, light-headedness, numbness and headaches.  Hematological: Denies adenopathy. Easy bruising,  personal or family bleeding history  Psychiatric/Behavioral: Denies suicidal ideation, mood changes, confusion, nervousness, sleep disturbance and agitation    Physical Exam: Vitals:   10/28/20 1413  BP: 96/64  Pulse: 83  Temp: 98.5 F (36.9 C)  TempSrc: Oral  SpO2: 97%  Weight: 131 lb 8 oz (59.6 kg)    Body mass index is 21.55 kg/m.   Constitutional: NAD, calm, comfortable Eyes: PERRL, lids and conjunctivae normal ENMT: Mucous membranes are moist.  Respiratory: clear to auscultation bilaterally, no wheezing, no crackles. Normal respiratory effort. No accessory muscle use.  Cardiovascular: Regular rate and rhythm, no murmurs / rubs / gallops. No extremity edema.  Musculoskeletal: Some bruising around her right shoulder/upper arm area.  She has full range of motion of the shoulder including abduction, ad duction, frontal raise.   Impression and Plan:  Right arm pain  Acute pain of right shoulder  Parkinson disease (HCC)  Frequent falls  Dark urine  -Based on exam today, I feel it is unlikely that she has suffered  a shoulder/arm fracture/rotator cuff injury. -Have advised NSAIDs, ice, follow-up in 3 to 4 weeks if no significant improvement. -Urine dipstick performed in office today negative for leukocytes, nitrates.  Time spent: 31 minutes reviewing chart, interviewing patient and husband, examining patient and formulating plan of care.     Chaya Jan, MD [facility]

## 2020-10-28 NOTE — Telephone Encounter (Signed)
Left message on machine for patient to return our call 

## 2020-11-04 DIAGNOSIS — Z85828 Personal history of other malignant neoplasm of skin: Secondary | ICD-10-CM | POA: Diagnosis not present

## 2020-11-04 DIAGNOSIS — D225 Melanocytic nevi of trunk: Secondary | ICD-10-CM | POA: Diagnosis not present

## 2020-11-04 DIAGNOSIS — L821 Other seborrheic keratosis: Secondary | ICD-10-CM | POA: Diagnosis not present

## 2020-11-04 DIAGNOSIS — D2272 Melanocytic nevi of left lower limb, including hip: Secondary | ICD-10-CM | POA: Diagnosis not present

## 2020-11-04 DIAGNOSIS — L814 Other melanin hyperpigmentation: Secondary | ICD-10-CM | POA: Diagnosis not present

## 2020-11-06 ENCOUNTER — Other Ambulatory Visit: Payer: Medicare PPO

## 2020-11-06 ENCOUNTER — Ambulatory Visit (INDEPENDENT_AMBULATORY_CARE_PROVIDER_SITE_OTHER): Payer: Medicare PPO

## 2020-11-06 DIAGNOSIS — G20 Parkinson's disease: Secondary | ICD-10-CM

## 2020-11-06 DIAGNOSIS — M810 Age-related osteoporosis without current pathological fracture: Secondary | ICD-10-CM | POA: Diagnosis not present

## 2020-11-06 DIAGNOSIS — F411 Generalized anxiety disorder: Secondary | ICD-10-CM | POA: Diagnosis not present

## 2020-11-06 DIAGNOSIS — R296 Repeated falls: Secondary | ICD-10-CM

## 2020-11-06 NOTE — Patient Instructions (Signed)
Visit Information  PATIENT GOALS:  Goals Addressed             This Visit's Progress    Manage My Emotions   On track    Timeframe:  Long-Range Goal Priority:  Medium Start Date:      08/27/20                       Expected End Date:       04/29/21                Follow Up Date 12/14/20    - learn and use visualization or guided imagery - practice relaxation or meditation daily - start or continue a personal journal - talk about feelings with a friend, family or spiritual advisor  -continue to work with counselor on stress and anxiety   Why is this important?   When you are stressed, down or upset, your body reacts too.  For example, your blood pressure may get higher; you may have a headache or stomachache.  When your emotions get the best of you, your body's ability to fight off cold and flu gets weak.  These steps will help you manage your emotions.     Notes:       Prevent Falls and Broken Bones-Osteoporosis   Not on track    Timeframe:  Long-Range Goal Priority:  High Start Date:     08/27/20                        Expected End Date:   04/29/21                    Follow Up Date 12/14/20    - always use handrails on the stairs - always wear shoes or slippers with non-slip sole - get at least 10 minutes of activity every day - install bathroom grab bars - keep a flashlight by the bed - keep cell phone with me always - make an emergency alert plan in case I fall - pick up clutter from the floors - remove, or use a non-slip pad, with my throw rugs - use a cane or walker - use a nightlight in the bathroom    Why is this important?   When you fall, there are 3 things that control if a bone breaks or not.  These are the fall itself, how hard and the direction that you fall and how fragile your bones are.  Preventing falls is very important for you because of fragile bones.     Notes:          Patient verbalizes understanding of instructions provided today and  agrees to view in MyChart.   Telephone follow up appointment with care management team member scheduled for:  Dudley Major RN, [facility], CDE Care Management Coordinator [facility] [phone], Mobile [phone]

## 2020-11-06 NOTE — Chronic Care Management (AMB) (Signed)
Chronic Care Management   CCM RN Visit Note  11/06/2020 Name: Kelly Howard MRN: [id number] DOB: 06/24/1950  Subjective: Kelly Howard is a 70 y.o. year old female who is a primary care patient of Philip Aspen, Limmie Patricia, MD. The care management team was consulted for assistance with disease management and care coordination needs.    Engaged with patient by telephone for follow up visit in response to provider referral for case management and/or care coordination services.   Consent to Services:  The patient was given information about Chronic Care Management services, agreed to services, and gave verbal consent prior to initiation of services.  Please see initial visit note for detailed documentation.   Patient agreed to services and verbal consent obtained.   Assessment: Review of patient past medical history, allergies, medications, health status, including review of consultants reports, laboratory and other test data, was performed as part of comprehensive evaluation and provision of chronic care management services.   SDOH (Social Determinants of Health) assessments and interventions performed:    CCM Care Plan  Allergies  Allergen Reactions   Codeine Other (See Comments)    GI Upset   Escitalopram Swelling    Foot swelling   Propoxyphene Other (See Comments)    Hallucinations    Outpatient Encounter Medications as of 11/06/2020  Medication Sig   Acetaminophen (TYLENOL PO) Take 500 mg by mouth as needed.   AMBULATORY NON FORMULARY MEDICATION U step walker  Dx: G20   AMBULATORY NON FORMULARY MEDICATION Abdominal compression binder Dx: G20   carbidopa-levodopa (SINEMET CR) 50-200 MG tablet TAKE 1 TABLET BY MOUTH EVERYDAY AT BEDTIME   carbidopa-levodopa (SINEMET IR) 25-100 MG tablet TAKE 1.5 TABLETS BY MOUTH 5 (FIVE) TIMES DAILY. 1.5 TABLET EVERY 3 HRS   clonazePAM (KLONOPIN) 0.5 MG tablet Take 1 tablet (0.5 mg total) by mouth 3 (three) times daily as  needed for anxiety.   ibandronate (BONIVA) 150 MG tablet Take 150 mg by mouth every 30 (thirty) days.   levothyroxine (SYNTHROID, LEVOTHROID) 50 MCG tablet Take 50 mcg by mouth daily before breakfast.   sertraline (ZOLOFT) 100 MG tablet TAKE ONE AND 1/2 TABLETS DAILY.   No facility-administered encounter medications on file as of 11/06/2020.    Patient Active Problem List   Diagnosis Date Noted   Vitamin D deficiency 07/10/2019   Vitamin B12 deficiency 07/10/2019   Hypothyroidism    Frequent falls    Osteoporosis    Closed fracture of right distal radius 12/25/2018   Parkinson disease (HCC) 10/20/2011    Conditions to be addressed/monitored:Anxiety and Parkinson disease, osteoporosis, frequent falls  Care Plan : RNCM:Osteoporosis (Adult)  Updates made by Yetta Glassman, RN since 11/06/2020 12:00 AM     Problem: Risk of harm or Injury related to hx of frequent falls and osteoporosis   Priority: High     Long-Range Goal: Harm or Injury Prevented   Start Date: 08/27/2020  Expected End Date: 11/27/2020  This Visit's Progress: Not on track  Recent Progress: On track  Priority: High  Note:   Current Barriers:  Knowledge Deficits related to fall precautions in patient with osteoporosis and Parkinson's disease pt has had over 4 falls in last year, reports using walker more but still not always using, Reports that she had a fall in her kitchen at the end of May and she hit her rt shoulder.  States she saw Dr. Ardyth Harps to check the shoulder and to have her urine checked.  States she  had not been using her walker.  States she has been trying to use her walker more now.  States she has been afraid to go on walks sometimes as she feels unsteady. Reports that her B/P was lower at her doctors visit.  Readings at home have been 95/57, 100/60, Denies feeling dizzy, husband states that they will be moving to their new home next month which will be on one level.   Decreased adherence to prescribed  treatment for fall prevention Does not adhere to provider recommendations re: use of walker Does not adhere to prescribed psychotropic medication regimen Unable to perform IADLs independently-family assists as needed Knowledge Deficits related to fall prevention and safety Chronic Disease Management support and education needs related to fall prevention and safety, osteoporosis Clinical Goal(s):  patient will demonstrate improved adherence to prescribed treatment plan for decreasing falls as evidenced by patient reporting and review of EMR patient will verbalize using fall risk reduction strategies discussed patient will not experience additional falls patient will verbalize understanding of plan for fall prevention and safety patient will meet with RN Care Manager to address educational needs for fall prevention and osteoporosis patient will attend all scheduled medical appointments: no scheduled primary care visit, neurology 11/12/20 patient will demonstrate improved adherence to prescribed treatment plan for fall prevention as evidenced by reported fewer falls Interventions:  Collaboration with Philip Aspen, Limmie Patricia, MD regarding development and update of comprehensive plan of care as evidenced by provider attestation and co-signature Inter-disciplinary care team collaboration (see longitudinal plan of care) Provided written and verbal education re: Potential causes of falls and Fall prevention strategies Reviewed medications and discussed potential side effects of medications such as dizziness and frequent urination Assessed for falls since last encounter. Reinforced fall risk prevention secondary to previously provided education. Evaluation of current treatment plan related to fall safety and patient's adherence to plan as established by provider. Reinforced to use walker at all times Reinforced education to patient re: fall prevention, safety and osteoporosis  Reviewed  scheduled/upcoming provider appointments including: Dr. Arbutus Leas 11/12/20 no current primary care provider appt scheduled Discussed plans with patient for ongoing care management follow up and provided patient with direct contact information for care management team Encouraged to try to time her activities when she is feeling stronger and to use her seated walker so she can rest if needed Instructed to drink adequate amounts of fluids and to get up slowly if she feels dizzy Self-Care Deficits:  Does not adhere to provider recommendations re: use of walker Unable to perform IADLs independently Patient Goals:  - Utilize walker (assistive device) appropriately with all ambulation - De-clutter walkways - Change positions slowly - Wear secure fitting shoes at all times with ambulation - Utilize home lighting for dim lit areas - Demonstrate self and pet awareness at all times - always use handrails on the stairs - always wear shoes or slippers with non-slip sole - get at least 10 minutes of activity every day - install bathroom grab bars - keep a flashlight by the bed - keep cell phone with me always - make an emergency alert plan in case I fall - pick up clutter from the floors - remove, or use a non-slip pad, with my throw rugs - use a cane or walker at all times - use a nightlight in the bathroom Follow Up Plan: Telephone follow up appointment with care management team member scheduled for: 12/14/20 at 10 AM The patient has been provided with contact information  for the care management team and has been advised to call with any health related questions or concerns.      Care Plan : RNCM: Parkinson's disease  Updates made by Yetta GlassmanSandlin, Floraine Buechler J, RN since 11/06/2020 12:00 AM     Problem: Lack of coping skills for Parkinson's disease   Priority: Medium     Long-Range Goal: Improved coping skills for Parkinson's disease   Start Date: 08/27/2020  Expected End Date: 11/27/2020  This Visit's  Progress: On track  Recent Progress: On track  Priority: Medium  Note:   Current Barriers:  Ineffective Self Health Maintenance for Parkinson's disease  Does not adhere to provider recommendations re:  Unable to perform IADLs independently Pt has had issues with frequent falls and coping with her disease, she is seeing her  counselor regularly, States she is still working on cutting back on the clonazepam she takes with her counselor. She continues to go to [employer] 1-2 times a week for Parkinson's exercise class, rides exercise bike and does weights, reports that she had a fall in her kitchen at the end of May and she hit her rt shoulder.  States she saw Dr. Ardyth HarpsHernandez to check the shoulder and to have her urine checked.  States she has been getting up a night frequently to urinate sometimes 6-8 times a night.  States she thinks she is emptying her bladder but will feel like she needs to go again soon after laying back down.  Denies any burning with urination.   Clinical Goal(s):  Collaboration with Philip AspenHernandez Acosta, Limmie PatriciaEstela Y, MD regarding development and update of comprehensive plan of care as evidenced by provider attestation and co-signature Inter-disciplinary care team collaboration (see longitudinal plan of care) patient will work with care management team to address care coordination and chronic disease management needs related to Disease Management Educational Needs   Interventions:  Evaluation of current treatment plan related to  Parkinson's disease , ADL IADL limitations and Mental Health Concerns  self-management and patient's adherence to plan as established by provider. Collaboration with Philip AspenHernandez Acosta, Limmie PatriciaEstela Y, MD regarding development and update of comprehensive plan of care as evidenced by provider attestation       and co-signature Inter-disciplinary care team collaboration (see longitudinal plan of care) Discussed plans with patient for ongoing care management follow up and  provided patient with direct contact information for care management team Reinforced to continue to follow up with neurology and counselor  Encourged to continue going to gym for Parkinson's classes Reinforced on s/sx of UTI to call provider and encourged to empty bladder frequently and to drink adequate amounts of fluids Discussed that her urinary symptoms could be related to her Parkinson's and to discuss with her neurologist at her 11/12/20 visit with Dr.Tat Self Care Activities:  Patient verbalizes understanding of plan to improve coping skills and self management of Parkinson's disease Self administers medications as prescribed Attends all scheduled provider appointments Calls provider office for new concerns or questions Patient Goals: - repeat what I heard to make sure I understand - bring a list of my medicines to the visit - speak up when I don't understand - keep a calendar with appointment dates - make a list of family or friends that I can call - learn and use visualization or guided imagery - practice relaxation or meditation daily - start or continue a personal journal - talk about feelings with a friend, family or spiritual advisor -use walker at all times Follow Up  Plan: Telephone follow up appointment with care management team member scheduled for: 12/14/20 at 10 AM The patient has been provided with contact information for the care management team and has been advised to call with any health related questions or concerns.       Plan:Telephone follow up appointment with care management team member scheduled for:  7/18/2 and The patient has been provided with contact information for the care management team and has been advised to call with any health related questions or concerns.  Dudley Major RN, Maximiano Coss, CDE Care Management Coordinator [facility] [phone], Mobile [phone]

## 2020-11-08 ENCOUNTER — Other Ambulatory Visit: Payer: Self-pay | Admitting: Neurology

## 2020-11-10 ENCOUNTER — Ambulatory Visit: Payer: Medicare PPO

## 2020-11-10 DIAGNOSIS — M810 Age-related osteoporosis without current pathological fracture: Secondary | ICD-10-CM

## 2020-11-10 DIAGNOSIS — G20 Parkinson's disease: Secondary | ICD-10-CM | POA: Diagnosis not present

## 2020-11-10 DIAGNOSIS — R300 Dysuria: Secondary | ICD-10-CM

## 2020-11-10 DIAGNOSIS — F411 Generalized anxiety disorder: Secondary | ICD-10-CM | POA: Diagnosis not present

## 2020-11-10 NOTE — Patient Instructions (Signed)
Visit Information  PATIENT GOALS:  Goals Addressed   None     Patient verbalizes understanding of instructions provided today and agrees to view in MyChart.   Telephone follow up appointment with care management team member scheduled for: 12/14/20  Dudley Major RN, [facility], CDE Care Management Coordinator [facility] [phone], Mobile [phone]

## 2020-11-10 NOTE — Chronic Care Management (AMB) (Signed)
Chronic Care Management   CCM RN Visit Note  11/10/2020 Name: Kelly Howard MRN: [id number] DOB: 1951/02/16  Subjective: Kelly Howard is a 70 y.o. year old female who is a primary care patient of Philip Aspen, Limmie Patricia, MD. The care management team was consulted for assistance with disease management and care coordination needs.    Engaged with patient by telephone for follow up visit in response to provider referral for case management and/or care coordination services.   Consent to Services:  The patient was given information about Chronic Care Management services, agreed to services, and gave verbal consent prior to initiation of services.  Please see initial visit note for detailed documentation.   Patient agreed to services and verbal consent obtained.   Assessment: Review of patient past medical history, allergies, medications, health status, including review of consultants reports, laboratory and other test data, was performed as part of comprehensive evaluation and provision of chronic care management services.   SDOH (Social Determinants of Health) assessments and interventions performed:    CCM Care Plan  Allergies  Allergen Reactions   Codeine Other (See Comments)    GI Upset   Escitalopram Swelling    Foot swelling   Propoxyphene Other (See Comments)    Hallucinations    Outpatient Encounter Medications as of 11/10/2020  Medication Sig   Acetaminophen (TYLENOL PO) Take 500 mg by mouth as needed.   AMBULATORY NON FORMULARY MEDICATION U step walker  Dx: G20   AMBULATORY NON FORMULARY MEDICATION Abdominal compression binder Dx: G20   carbidopa-levodopa (SINEMET CR) 50-200 MG tablet TAKE 1 TABLET BY MOUTH EVERYDAY AT BEDTIME   carbidopa-levodopa (SINEMET IR) 25-100 MG tablet TAKE 1.5 TABLETS BY MOUTH 5 (FIVE) TIMES DAILY. 1.5 TABLET EVERY 3 HRS   clonazePAM (KLONOPIN) 0.5 MG tablet Take 1 tablet (0.5 mg total) by mouth 3 (three) times daily as  needed for anxiety.   ibandronate (BONIVA) 150 MG tablet Take 150 mg by mouth every 30 (thirty) days.   levothyroxine (SYNTHROID, LEVOTHROID) 50 MCG tablet Take 50 mcg by mouth daily before breakfast.   sertraline (ZOLOFT) 100 MG tablet TAKE ONE AND 1/2 TABLETS DAILY.   No facility-administered encounter medications on file as of 11/10/2020.    Patient Active Problem List   Diagnosis Date Noted   Vitamin D deficiency 07/10/2019   Vitamin B12 deficiency 07/10/2019   Hypothyroidism    Frequent falls    Osteoporosis    Closed fracture of right distal radius 12/25/2018   Parkinson disease (HCC) 10/20/2011    Conditions to be addressed/monitored: frequent urination and Parkinson disease  Care Plan : RNCM: Parkinson's disease  Updates made by Yetta Glassman, RN since 11/10/2020 12:00 AM     Problem: Lack of coping skills for Parkinson's disease   Priority: Medium     Long-Range Goal: Improved coping skills for Parkinson's disease   Start Date: 08/27/2020  Expected End Date: 11/27/2020  This Visit's Progress: Not on track  Recent Progress: On track  Priority: Medium  Note:   Current Barriers:  Ineffective Self Health Maintenance for Parkinson's disease  Does not adhere to provider recommendations re:  Unable to perform IADLs independently Pt has had issues with frequent falls and coping with her disease, she is seeing her  counselor regularly, States she is still working on cutting back on the clonazepam she takes with her counselor. She continues to go to [employer] 1-2 times a week for Parkinson's exercise class, rides exercise bike  and does weights, reports that she had a fall in her kitchen at the end of May and she hit her rt shoulder.  States she saw Dr. Ardyth Harps to check the shoulder and to have her urine checked.  States she has been getting up a night frequently to urinate sometimes 6-8 times a night.  States she thinks she is emptying her bladder but will feel like she needs  to go again soon after laying back down.  Denies any burning with urination.  11/10/20-Phone call from pt and [name] who state that pt has had a "rough time" for the last few days with her frequent urination Clinical Goal(s):  Collaboration with Philip Aspen, Limmie Patricia, MD regarding development and update of comprehensive plan of care as evidenced by provider attestation and co-signature Inter-disciplinary care team collaboration (see longitudinal plan of care) patient will work with care management team to address care coordination and chronic disease management needs related to Disease Management Educational Needs   Interventions:  Evaluation of current treatment plan related to  Parkinson's disease , ADL IADL limitations and Mental Health Concerns  self-management and patient's adherence to plan as established by provider. Collaboration with Philip Aspen, Limmie Patricia, MD regarding development and update of comprehensive plan of care as evidenced by provider attestation       and co-signature Inter-disciplinary care team collaboration (see longitudinal plan of care) Discussed plans with patient for ongoing care management follow up and provided patient with direct contact information for care management team Reinforced to continue to follow up with neurology and counselor  Encourged to continue going to gym for Parkinson's classes Reinforced on s/sx of UTI to call provider and encourged to empty bladder frequently and to drink adequate amounts of fluids Discussed that her urinary symptoms could be related to her Parkinson's and to discuss with her neurologist at her 11/12/20 visit with Dr.Tat 11/10/20- Advised to call to schedule appointment to have her urine checked to r/o UTI Communicated to provider pts increased overactive bladder symptoms Self Care Activities:  Patient verbalizes understanding of plan to improve coping skills and self management of Parkinson's disease Self  administers medications as prescribed Attends all scheduled provider appointments Calls provider office for new concerns or questions Patient Goals: - repeat what I heard to make sure I understand - bring a list of my medicines to the visit - speak up when I don't understand - keep a calendar with appointment dates - make a list of family or friends that I can call - learn and use visualization or guided imagery - practice relaxation or meditation daily - start or continue a personal journal - talk about feelings with a friend, family or spiritual advisor -use walker at all times Call to schedule appt with provider  Follow Up Plan: Telephone follow up appointment with care management team member scheduled for: 12/14/20 at 10 AM The patient has been provided with contact information for the care management team and has been advised to call with any health related questions or concerns.       Plan:Telephone follow up appointment with care management team member scheduled for:  12/14/20 Dudley Major RN, [facility], CDE Care Management Coordinator [facility] [phone], Mobile [phone]

## 2020-11-10 NOTE — Progress Notes (Signed)
Assessment/Plan:   1.  Parkinsons Disease with disease complicated by falls, dyskinesia, compulsive behaviors (much better off pramipexole)  -Continue carbidopa/levodopa 25/100, 1.5 tablets at 6 AM/9 AM/noon/3 PM/6 PM  -Continue carbidopa/levodopa 50/200 at bedtime  -Needs to use walker at all times - discussed this again today and several prior visits.  2.  Significant GAD   -somewhat better but still quite significant  -Follows with psych NP now.  -The patient has significantly tapered down on her clonazepam but would like to continue to see it tapered.  Really do not have objection to this during the night, but wanted her off of it at day  -met with LCSW today  3.  Dysphagia  -Husband thinks that really it is related to her anxiety and becomes an issue as her anxiety increases.  Have offered MBE multiple times and patient has declined.  -She has seen ENT regarding globus sensation and Nasacort and omeprazole were recommended.  I have discussed with the patient several times that many of our patients complain about a phlegm sensation in the throat that is independent of dysphagia, but I have never found any type of medication to help with this.  4.  Urinary frequency  -needs to see urology.  Will place referral  5.  Parkinsons Disease hallucinations  -is another reason I want her off of daytime klonopin  -discussed nuplazid but she doesn't want that right now Subjective:   Kelly Howard was seen today in follow up for Parkinsons disease.  My previous records were reviewed prior to todays visit as well as outside records available to me.   Records reviewed since last visit.  She is working with her psychiatry nurse practitioner to taper off of clonazepam.  She was down to 1/2 tablet per day and now is up to 1/2 tablet three times per day.  Patient has experienced falls, the last was at the end of May.  She fell in the kitchen (someone had stopped in front of her and she wasn't  able to adjust quickly enough) and hit her right side on the stove.  She did see primary care after that.  Notes are reviewed from that visit.  She has appt with ortho upcoming as a follow up (had already been having pain in the shoulder prior to the fall).  There were perhaps 1-2 other falls but husband states that there have not been a lot and not many lately.  Some troubles at night turning over in the night in the bed.  Getting up frequently at night to use the bathroom (could be up to 8times).  Some visual hallucinations, day and night.  Used to scare her but doesn't any more.    Current prescribed movement disorder medications: Carbidopa/levodopa 25/100, 1.5 tablet at 6 AM/9 AM/noon/3 PM/6 PM  Carbidopa/levodopa 50/200 CR at bedtime  Sertraline 100 mg daily Clonazepam 0.5 mg,  now taking 1/2 tab 3 times daily as needed (prescribed by nurse practitioner at psychiatry) (Rasagiline discontinued last visit)  PREVIOUS MEDICATIONS: pramipexole (discontinued because of compulsive behavior and hallucinations); Requip; Paxil; rasagaline  ALLERGIES:   Allergies  Allergen Reactions   Codeine Other (See Comments)    GI Upset   Escitalopram Swelling    Foot swelling   Propoxyphene Other (See Comments)    Hallucinations    CURRENT MEDICATIONS:  Outpatient Encounter Medications as of 11/12/2020  Medication Sig   Acetaminophen (TYLENOL PO) Take 500 mg by mouth as needed.   AMBULATORY  NON FORMULARY MEDICATION U step walker  Dx: G20   AMBULATORY NON FORMULARY MEDICATION Abdominal compression binder Dx: G20   carbidopa-levodopa (SINEMET CR) 50-200 MG tablet TAKE 1 TABLET BY MOUTH EVERYDAY AT BEDTIME   carbidopa-levodopa (SINEMET IR) 25-100 MG tablet TAKE 1.5 TABLETS BY MOUTH 5 (FIVE) TIMES DAILY. 1.5 TABLET EVERY 3 HRS   clonazePAM (KLONOPIN) 0.5 MG tablet Take 1 tablet (0.5 mg total) by mouth 3 (three) times daily as needed for anxiety.   ibandronate (BONIVA) 150 MG tablet Take 150 mg by mouth  every 30 (thirty) days.   levothyroxine (SYNTHROID, LEVOTHROID) 50 MCG tablet Take 50 mcg by mouth daily before breakfast.   sertraline (ZOLOFT) 100 MG tablet TAKE ONE AND 1/2 TABLETS DAILY.   No facility-administered encounter medications on file as of 11/12/2020.    Objective:   PHYSICAL EXAMINATION:    VITALS:   Vitals:   11/12/20 1331  BP: (!) 110/58  Pulse: 100  SpO2: 98%  Weight: 132 lb (59.9 kg)  Height: _0  (1.651 m)    GEN:  The patient appears stated age and is in NAD. HEENT:  Normocephalic, atraumatic.  The mucous membranes are moist. The superficial temporal arteries are without ropiness or tenderness. CV:  RRR Lungs:  CTAB Neck/HEME:  There are no carotid bruits bilaterally.  Neurological examination:  Orientation: The patient is alert and oriented x3. Cranial nerves: There is good facial symmetry with mild facial hypomimia. The speech is fluent and clear. Soft palate rises symmetrically and there is no tongue deviation. Hearing is intact to conversational tone. Sensation: Sensation is intact to light touch throughout Motor: Strength is at least antigravity x4.  Movement examination: Tone: There is normal tone in the UE/LE Abnormal movements: there is LUE rest tremor Coordination:  There is mild decremation with RAM's, with finger taps and hand opening and closing on the R Gait and Station: The patient has no difficulty arising out of a deep-seated chair without the use of the hands. The patient's stride length is good.    I have reviewed and interpreted the following labs independently    Chemistry      Component Value Date/Time   NA 140 08/05/2020 1050   K 3.9 08/05/2020 1050   CL 102 08/05/2020 1050   CO2 31 08/05/2020 1050   BUN 13 08/05/2020 1050   CREATININE 0.65 08/05/2020 1050      Component Value Date/Time   CALCIUM 9.3 08/05/2020 1050   ALKPHOS 59 08/05/2020 1050   AST 13 08/05/2020 1050   ALT 2 08/05/2020 1050   BILITOT 0.7 08/05/2020  1050       Lab Results  Component Value Date   WBC 4.4 08/05/2020   HGB 11.5 (L) 08/05/2020   HCT 34.2 (L) 08/05/2020   MCV 87.1 08/05/2020   PLT 257.0 08/05/2020    Lab Results  Component Value Date   TSH 0.98 08/05/2020     Total time spent on today's visit was 30 minutes, including both face-to-face time and nonface-to-face time.  Time included that spent on review of records (prior notes available to me/labs/imaging if pertinent), discussing treatment and goals, answering patient's questions and coordinating care.  Cc:  [name], Rayford Halsted, MD

## 2020-11-12 ENCOUNTER — Ambulatory Visit (INDEPENDENT_AMBULATORY_CARE_PROVIDER_SITE_OTHER): Payer: Medicare PPO | Admitting: Neurology

## 2020-11-12 ENCOUNTER — Encounter: Payer: Self-pay | Admitting: Neurology

## 2020-11-12 ENCOUNTER — Other Ambulatory Visit: Payer: Self-pay

## 2020-11-12 VITALS — BP 110/58 | HR 100 | Ht 65.0 in | Wt 132.0 lb

## 2020-11-12 DIAGNOSIS — G20 Parkinson's disease: Secondary | ICD-10-CM | POA: Diagnosis not present

## 2020-11-12 DIAGNOSIS — R441 Visual hallucinations: Secondary | ICD-10-CM

## 2020-11-12 DIAGNOSIS — F411 Generalized anxiety disorder: Secondary | ICD-10-CM | POA: Diagnosis not present

## 2020-11-12 DIAGNOSIS — R413 Other amnesia: Secondary | ICD-10-CM | POA: Diagnosis not present

## 2020-11-12 MED ORDER — CARBIDOPA-LEVODOPA 25-100 MG PO TABS: ORAL_TABLET | ORAL | 1 refills | Status: DC

## 2020-11-13 ENCOUNTER — Encounter: Payer: Self-pay | Admitting: Adult Health

## 2020-11-13 ENCOUNTER — Other Ambulatory Visit: Payer: Self-pay | Admitting: Neurology

## 2020-11-13 ENCOUNTER — Telehealth: Payer: Self-pay

## 2020-11-13 ENCOUNTER — Ambulatory Visit (INDEPENDENT_AMBULATORY_CARE_PROVIDER_SITE_OTHER): Payer: Medicare PPO | Admitting: Adult Health

## 2020-11-13 DIAGNOSIS — F410 Panic disorder [episodic paroxysmal anxiety] without agoraphobia: Secondary | ICD-10-CM

## 2020-11-13 DIAGNOSIS — M19011 Primary osteoarthritis, right shoulder: Secondary | ICD-10-CM | POA: Diagnosis not present

## 2020-11-13 DIAGNOSIS — M5030 Other cervical disc degeneration, unspecified cervical region: Secondary | ICD-10-CM | POA: Diagnosis not present

## 2020-11-13 DIAGNOSIS — R351 Nocturia: Secondary | ICD-10-CM

## 2020-11-13 DIAGNOSIS — F411 Generalized anxiety disorder: Secondary | ICD-10-CM

## 2020-11-13 DIAGNOSIS — G4700 Insomnia, unspecified: Secondary | ICD-10-CM | POA: Diagnosis not present

## 2020-11-13 DIAGNOSIS — F331 Major depressive disorder, recurrent, moderate: Secondary | ICD-10-CM

## 2020-11-13 DIAGNOSIS — R350 Frequency of micturition: Secondary | ICD-10-CM

## 2020-11-13 MED ORDER — SERTRALINE HCL 100 MG PO TABS: ORAL_TABLET | ORAL | 3 refills | Status: DC

## 2020-11-13 MED ORDER — CLONAZEPAM 0.5 MG PO TABS: 0.5000 mg | ORAL_TABLET | Freq: Three times a day (TID) | ORAL | 2 refills | Status: DC | PRN

## 2020-11-13 NOTE — Telephone Encounter (Signed)
Referral has been created and faxed to [facility].

## 2020-11-13 NOTE — Telephone Encounter (Signed)
-----   Message from Octaviano Batty Tat, DO sent at 11/13/2020  8:10 AM EDT ----- Please place urology referral to [facility] for urinary frequency/nocturia.  Forgot yesterday

## 2020-11-13 NOTE — Progress Notes (Signed)
Kelly Howard [id number] 12-Feb-1951 70 y.o.  Subjective:   Patient ID:  Kelly Howard is a 70 y.o. (DOB [birth year]) female.  Chief Complaint: No chief complaint on file.   HPI Kelly Howard presents to the office today for follow-up of GAD, MDD, panic attacks, and insomnia.  Accompanied by friend - helping with medications.  Describes mood today as "ok". Tearful at times.Pleasant. Mood symptoms - denies depression and irritability. Feels anxious "all the time just about". Continues to taper off Clonazepam, but still taking on days with increased anxiety. Taking the Zoloft 150mg  daily ad feels like it is helpful. Neurology has referred her to urology - frequent UTI's. Has sold house and plans to move into a one level home. Stable interest and motivation. Taking medications as prescribed. Diagnosed with Parkinson's in 2010.  Energy levels improved. Active, has a regular exercise routine. Enjoys some usual interests and activities. Married. Lives with husband of 46 years. Has 2 daughters - 3 grandchildren. Mother is a 71 years old - visited recently. Spending time with family. Appetite adequate. Weight stable - 131 pounds.  Sleeps better some nights than others. Averages 7 to 8 hours, up and down to the bathroom. Focus and concentration difficulties. Reading. Completing tasks. Managing aspects of household. Denies SI or HI.  Denies AH. Positive for VH - at times - seeing 3 to 4 people - paint on them - mardi gras. Working with 2 therapists - one for Parkinson's and one for daughter's issues.   Previous medication trials: Clonazepam, Zoloft, Paxil, Celexa     PHQ2-9    Flowsheet Row Chronic Care Management from 08/27/2020 in [facility] from 08/05/2020 in [facility] from 05/20/2020 in [facility] from 12/31/2019 in [facility] from  04/11/2019 in [facility] Total Score 4 4 1  0 0  PHQ-9 Total Score 7 8 -- 0 3        Review of Systems:  Review of Systems  Musculoskeletal:  Negative for gait problem.  Neurological:  Negative for tremors.  Psychiatric/Behavioral:         Please refer to HPI   Medications: I have reviewed the patient's current medications.  Current Outpatient Medications  Medication Sig Dispense Refill   Acetaminophen (TYLENOL PO) Take 500 mg by mouth as needed.     AMBULATORY NON FORMULARY MEDICATION U step walker  Dx: G20 1 Device 0   AMBULATORY NON FORMULARY MEDICATION Abdominal compression binder Dx: G20 1 Device 0   carbidopa-levodopa (SINEMET CR) 50-200 MG tablet TAKE 1 TABLET BY MOUTH EVERYDAY AT BEDTIME 30 tablet 0   carbidopa-levodopa (SINEMET IR) 25-100 MG tablet TAKE 1.5 TABLETS BY MOUTH 5 (FIVE) TIMES DAILY. 6am/9am/noon/3pm/5pm 675 tablet 1   clonazePAM (KLONOPIN) 0.5 MG tablet Take 1 tablet (0.5 mg total) by mouth 3 (three) times daily as needed for anxiety. 90 tablet 2   ibandronate (BONIVA) 150 MG tablet Take 150 mg by mouth every 30 (thirty) days.     levothyroxine (SYNTHROID, LEVOTHROID) 50 MCG tablet Take 50 mcg by mouth daily before breakfast.     sertraline (ZOLOFT) 100 MG tablet TAKE ONE AND 1/2 TABLETS DAILY. 135 tablet 3   No current facility-administered medications for this visit.    Medication Side Effects: None  Allergies:  Allergies  Allergen Reactions   Codeine Other (See Comments)    GI Upset   Escitalopram  Swelling    Foot swelling   Propoxyphene Other (See Comments)    Hallucinations    Past Medical History:  Diagnosis Date   Frequent falls    Hypothyroidism    Osteoporosis    Parkinson's disease (HCC)     Past Medical History, Surgical history, Social history, and Family history were reviewed and updated as appropriate.   Please see review of systems for further details on the patient's review from today.    Objective:   Physical Exam:  LMP  (LMP Unknown) Comment: gyn  Physical Exam Constitutional:      General: She is not in acute distress. Musculoskeletal:        General: No deformity.  Neurological:     Mental Status: She is alert and oriented to person, place, and time.     Coordination: Coordination normal.  Psychiatric:        Attention and Perception: Attention and perception normal. She does not perceive auditory or visual hallucinations.        Mood and Affect: Mood normal. Mood is not anxious or depressed. Affect is not labile, blunt, angry or inappropriate.        Speech: Speech normal.        Behavior: Behavior normal.        Thought Content: Thought content normal. Thought content is not paranoid or delusional. Thought content does not include homicidal or suicidal ideation. Thought content does not include homicidal or suicidal plan.        Cognition and Memory: Cognition and memory normal.        Judgment: Judgment normal.     Comments: Insight intact    Lab Review:     Component Value Date/Time   NA 140 08/05/2020 1050   K 3.9 08/05/2020 1050   CL 102 08/05/2020 1050   CO2 31 08/05/2020 1050   GLUCOSE 95 08/05/2020 1050   BUN 13 08/05/2020 1050   CREATININE 0.65 08/05/2020 1050   CALCIUM 9.3 08/05/2020 1050   PROT 7.0 08/05/2020 1050   ALBUMIN 4.5 08/05/2020 1050   AST 13 08/05/2020 1050   ALT 2 08/05/2020 1050   ALKPHOS 59 08/05/2020 1050   BILITOT 0.7 08/05/2020 1050       Component Value Date/Time   WBC 4.4 08/05/2020 1050   RBC 3.93 08/05/2020 1050   HGB 11.5 (L) 08/05/2020 1050   HCT 34.2 (L) 08/05/2020 1050   PLT 257.0 08/05/2020 1050   MCV 87.1 08/05/2020 1050   MCHC 33.5 08/05/2020 1050   RDW 13.6 08/05/2020 1050   LYMPHSABS 0.8 08/05/2020 1050   MONOABS 0.5 08/05/2020 1050   EOSABS 0.0 08/05/2020 1050   BASOSABS 0.0 08/05/2020 1050    No results found for: POCLITH, LITHIUM   No results found for: PHENYTOIN, PHENOBARB, VALPROATE,  CBMZ   .res Assessment: Plan:    Plan:  PDMP reviewed  1. Continue Zoloft 150mg  daily 2. Continue taper off Clonazepam 0.5mg    RTC 4 weeks  Following up with PCP today.   Patient advised to contact office with any questions, adverse effects, or acute worsening in signs and symptoms.  Discussed potential benefits, risk, and side effects of benzodiazepines to include potential risk of tolerance and dependence, as well as possible drowsiness.  Advised patient not to drive if experiencing drowsiness and to take lowest possible effective dose to minimize risk of dependence and tolerance.   Diagnoses and all orders for this visit:  Insomnia, unspecified type -  clonazePAM (KLONOPIN) 0.5 MG tablet; Take 1 tablet (0.5 mg total) by mouth 3 (three) times daily as needed for anxiety.  Panic attacks -     clonazePAM (KLONOPIN) 0.5 MG tablet; Take 1 tablet (0.5 mg total) by mouth 3 (three) times daily as needed for anxiety.  Generalized anxiety disorder -     clonazePAM (KLONOPIN) 0.5 MG tablet; Take 1 tablet (0.5 mg total) by mouth 3 (three) times daily as needed for anxiety. -     sertraline (ZOLOFT) 100 MG tablet; TAKE ONE AND 1/2 TABLETS DAILY.  Major depressive disorder, recurrent episode, moderate (HCC) -     sertraline (ZOLOFT) 100 MG tablet; TAKE ONE AND 1/2 TABLETS DAILY.    Please see After Visit Summary for patient specific instructions.  Future Appointments  Date Time Provider Department Center  12/14/2020 10:00 AM [facility]  06/10/2021  2:30 PM Tat, [name] S, DO [facility] None    No orders of the defined types were placed in this encounter.   -------------------------------

## 2020-11-18 ENCOUNTER — Other Ambulatory Visit: Payer: Self-pay | Admitting: Neurology

## 2020-11-19 ENCOUNTER — Telehealth: Payer: Self-pay | Admitting: Neurology

## 2020-11-19 NOTE — Telephone Encounter (Signed)
I'm pretty sure that we already sent urology referral the day she was here.  If you did, they should be able to call [facility] for the appt.

## 2020-11-19 NOTE — Telephone Encounter (Signed)
Pt's husband called in wanting to see if Dr. Arbutus Leas can refer the patient to a Urologist and wants to get a refill of the carbidopa-levodopa

## 2020-11-19 NOTE — Telephone Encounter (Signed)
Pt husband called back no answer LVM to call the office back.when they call back let husband know that  [facility] their number is [phone] they can call to schedule an appointment, Dr Tat sent in a refill for carbidopa-levodopa

## 2020-11-20 NOTE — Telephone Encounter (Signed)
Pt husband called back no answer LVM to call the office back.when they call back let husband know that  [facility] their number is [phone] they can call to schedule an appointment, Dr Tat sent in a refill for carbidopa-levodopa 

## 2020-11-20 NOTE — Telephone Encounter (Signed)
Patient called back herself and, with her daughter's help, wrote down the number to [facility] to call for scheduling.  She is also aware a refill has been sent in for her.

## 2020-12-03 ENCOUNTER — Telehealth: Payer: Self-pay | Admitting: Adult Health

## 2020-12-03 NOTE — Telephone Encounter (Signed)
Kelly Howard stated she is currently taking 1.5 tabs of the sertraline.She wes suppose to taper off klonopin but has not done that yet,it is difficulty for her.She is struggling,having some increased anxiety and joseph thinks meds may need to be adjusted.I asked admins to add her to cancellation list because he thinks she needs to be seen sooner.In the meantime he wants to know should anything be changed,he believes she may be taking too much zoloft.

## 2020-12-03 NOTE — Telephone Encounter (Signed)
Called and spoke with husband

## 2020-12-03 NOTE — Telephone Encounter (Signed)
Kelly Howard stated she takes 1 and a half tab of sertraline daily.She has been checked for a UTI recently and will see the urologist on 7/27.She takes between 1-2 klonopin a day because she splits them up by taking  a half tab at a time.He stated he was not sure if she is taking too much Zoloft or not he just knows she is not doing well with her anxiety.

## 2020-12-03 NOTE — Telephone Encounter (Signed)
Patient spouse Jomarie Longs called inquiring about the Sertraline and Clonazepam regimen she is taking. Direct calls to [phone].

## 2020-12-03 NOTE — Telephone Encounter (Signed)
Please add to cancellation list

## 2020-12-03 NOTE — Telephone Encounter (Signed)
Why does he think she is taking too much Zoloft? How much Zoloft is she taking currently? How often is she taking the Clonazepam? Has she been checked for a UTI recently.

## 2020-12-06 DIAGNOSIS — N3000 Acute cystitis without hematuria: Secondary | ICD-10-CM | POA: Diagnosis not present

## 2020-12-06 DIAGNOSIS — N390 Urinary tract infection, site not specified: Secondary | ICD-10-CM | POA: Diagnosis not present

## 2020-12-07 IMAGING — CT CT CERVICAL SPINE WITHOUT CONTRAST
4 of 7 series · 13 of 33 positions shown, 14 images · non-contrast
Comparison: None.

CLINICAL DATA: Fall down stairs striking back of head.

EXAM:
CT HEAD WITHOUT CONTRAST
CT CERVICAL SPINE WITHOUT CONTRAST
TECHNIQUE: Multidetector CT imaging of the head and cervical spine was
performed following the standard protocol without intravenous
contrast. Multiplanar CT image reconstructions of the cervical spine
were also generated.

[Series 9: c spine soft · axial · 0.32mm/px · z∈[-275,-167]mm · 4 of 92 slices shown]
[im 19/92  soft-tissue]
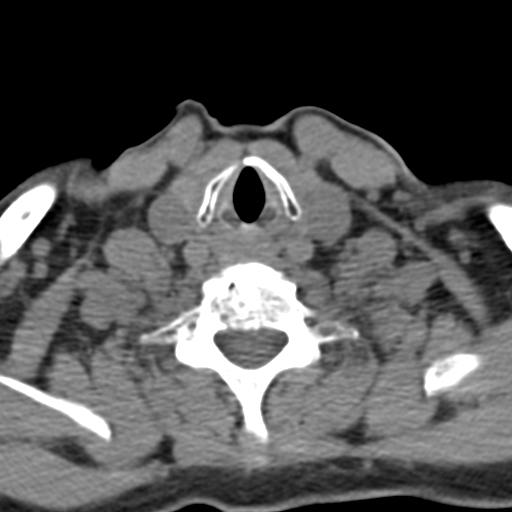
[im 37/92  soft-tissue]
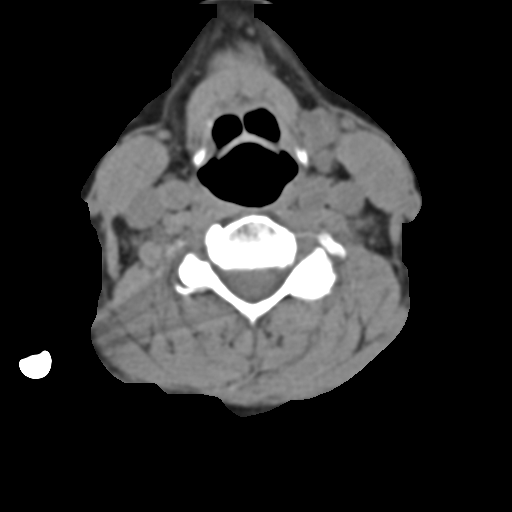
[im 55/92  soft-tissue]
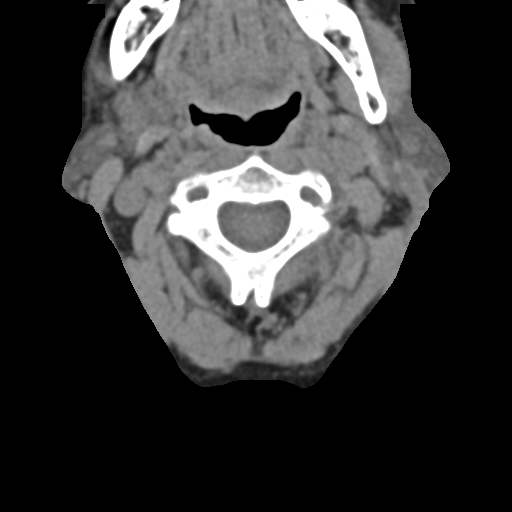
[im 73/92  soft-tissue]
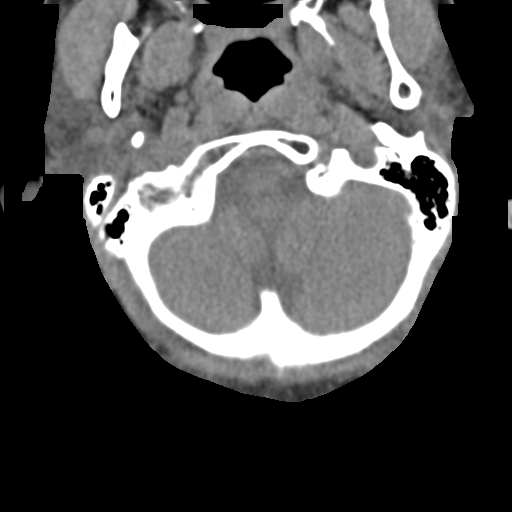

[Series 11: orthogonal bone · axial · 0.23mm/px · z∈[-265,-165]mm · 4 of 93 slices shown, 5 images]
[im 19/93  soft-tissue]
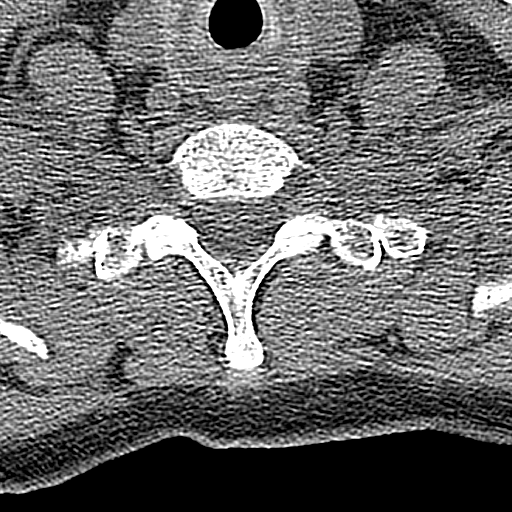
[im 19/93  bone]
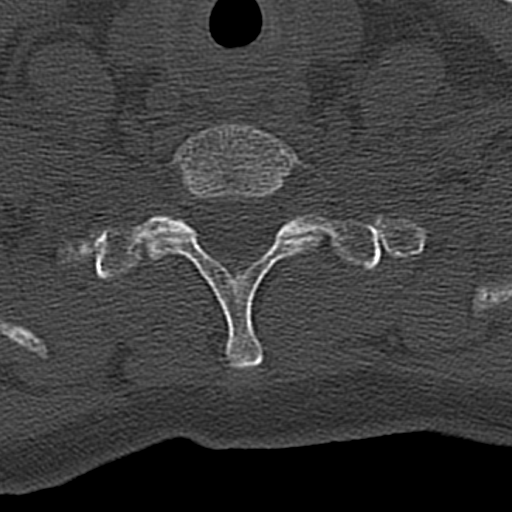
[im 37/93  bone]
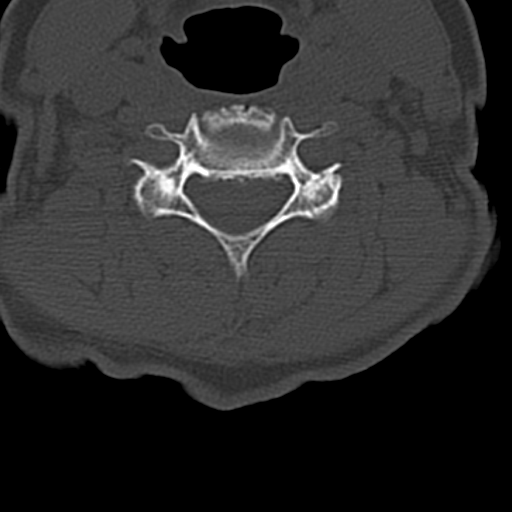
[im 56/93  bone]
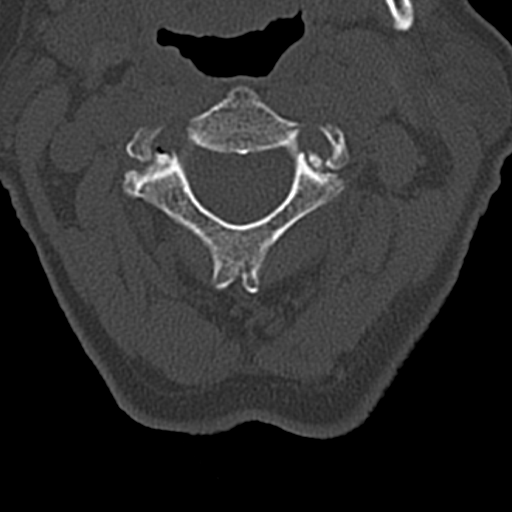
[im 74/93  bone]
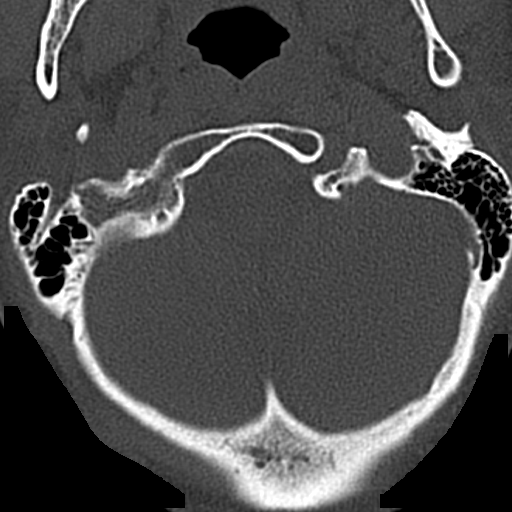

[Series 12: coronal bone · coronal · 0.23mm/px · 1 of 55 slices shown]
[im 28/55  bone]
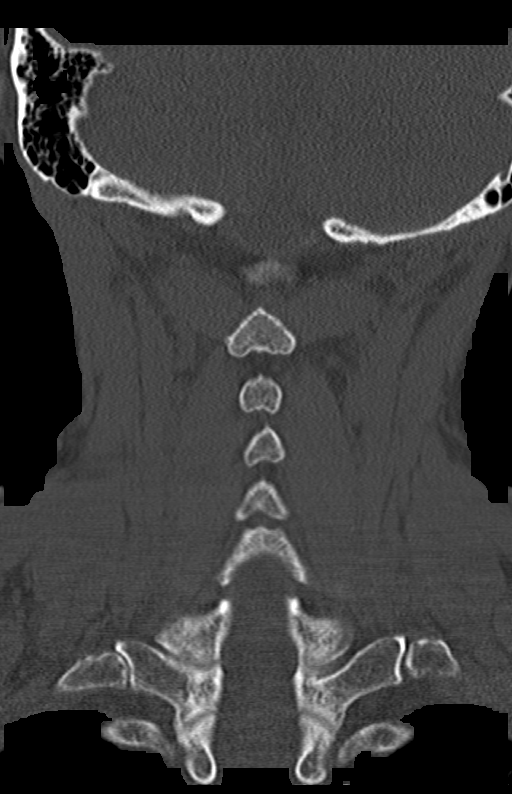

[Series 13: sagittal bone · sagittal · 0.25mm/px · 4 of 49 slices shown]
[im 10/49  bone]
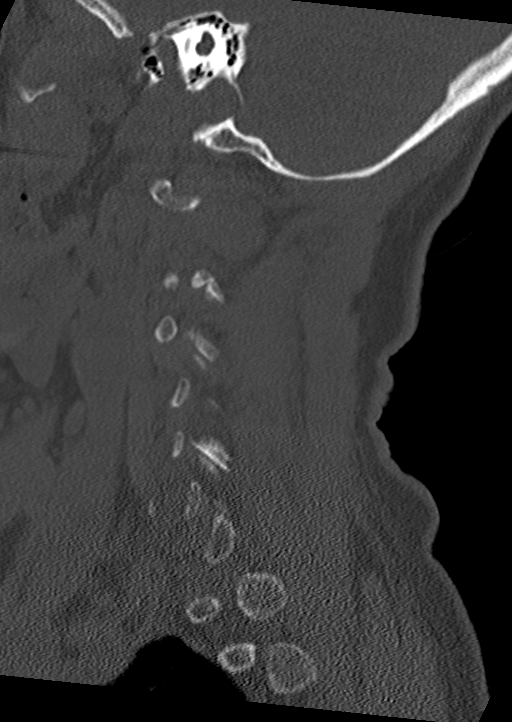
[im 20/49  bone]
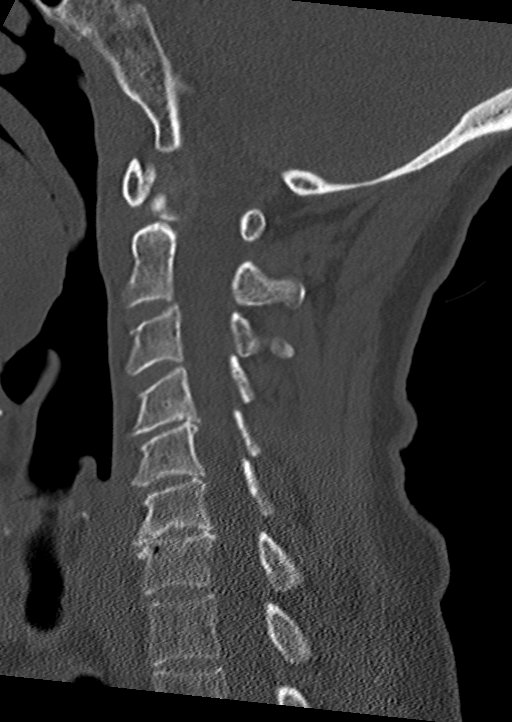
[im 29/49  bone]
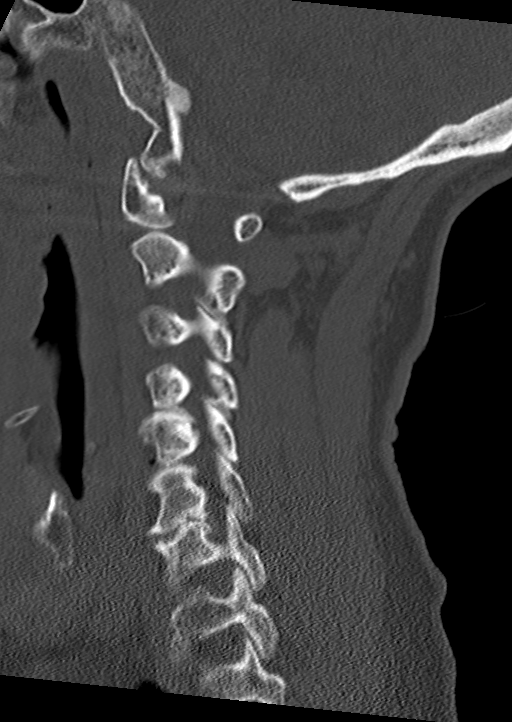
[im 39/49  bone]
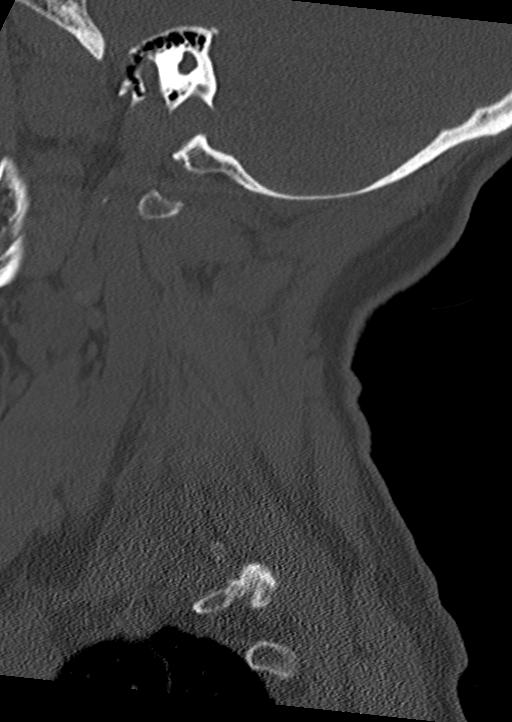

[13 of 33 positions shown; findings below may reference images not displayed]

FINDINGS: CT HEAD FINDINGS

Brain: No evidence of acute infarction, hemorrhage, hydrocephalus,
extra-axial collection or mass lesion/mass effect. Mild generalized
atrophy.

Vascular: No hyperdense vessel.

Skull: No fracture or focal lesion.

Sinuses/Orbits: Paranasal sinuses and mastoid air cells are clear.
Frontal sinuses are hypo pneumatized. The visualized orbits are
unremarkable.

Other: Small right parietal scalp hematoma.

CT CERVICAL SPINE FINDINGS

Alignment: Straightening of normal lordosis. No traumatic
subluxation.

Skull base and vertebrae: No acute fracture. Vertebral body heights
are maintained. The dens and skull base are intact.

Soft tissues and spinal canal: No prevertebral fluid or swelling. No
visible canal hematoma.

Disc levels: Multilevel endplate spurring. Disc space narrowing from
C4-C5 through C6-C7. Scattered facet hypertrophy.

Upper chest: Negative.

Other: None.
IMPRESSION: 1. Small right parietal scalp hematoma. No acute intracranial
abnormality. No skull fracture.
2. Multilevel degenerative change in the cervical spine without
acute fracture or subluxation.

## 2020-12-08 ENCOUNTER — Ambulatory Visit: Payer: Medicare PPO | Admitting: Adult Health

## 2020-12-08 ENCOUNTER — Other Ambulatory Visit: Payer: Self-pay

## 2020-12-08 IMAGING — CR LEFT KNEE - COMPLETE 4+ VIEW
4 series · 4 of 4 positions shown · non-contrast
Comparison: None.

CLINICAL DATA: Fall down stairs with left knee and lower leg pain.

EXAM:
LEFT KNEE - COMPLETE 4+ VIEW

[x knee ap left (1 of 3)]
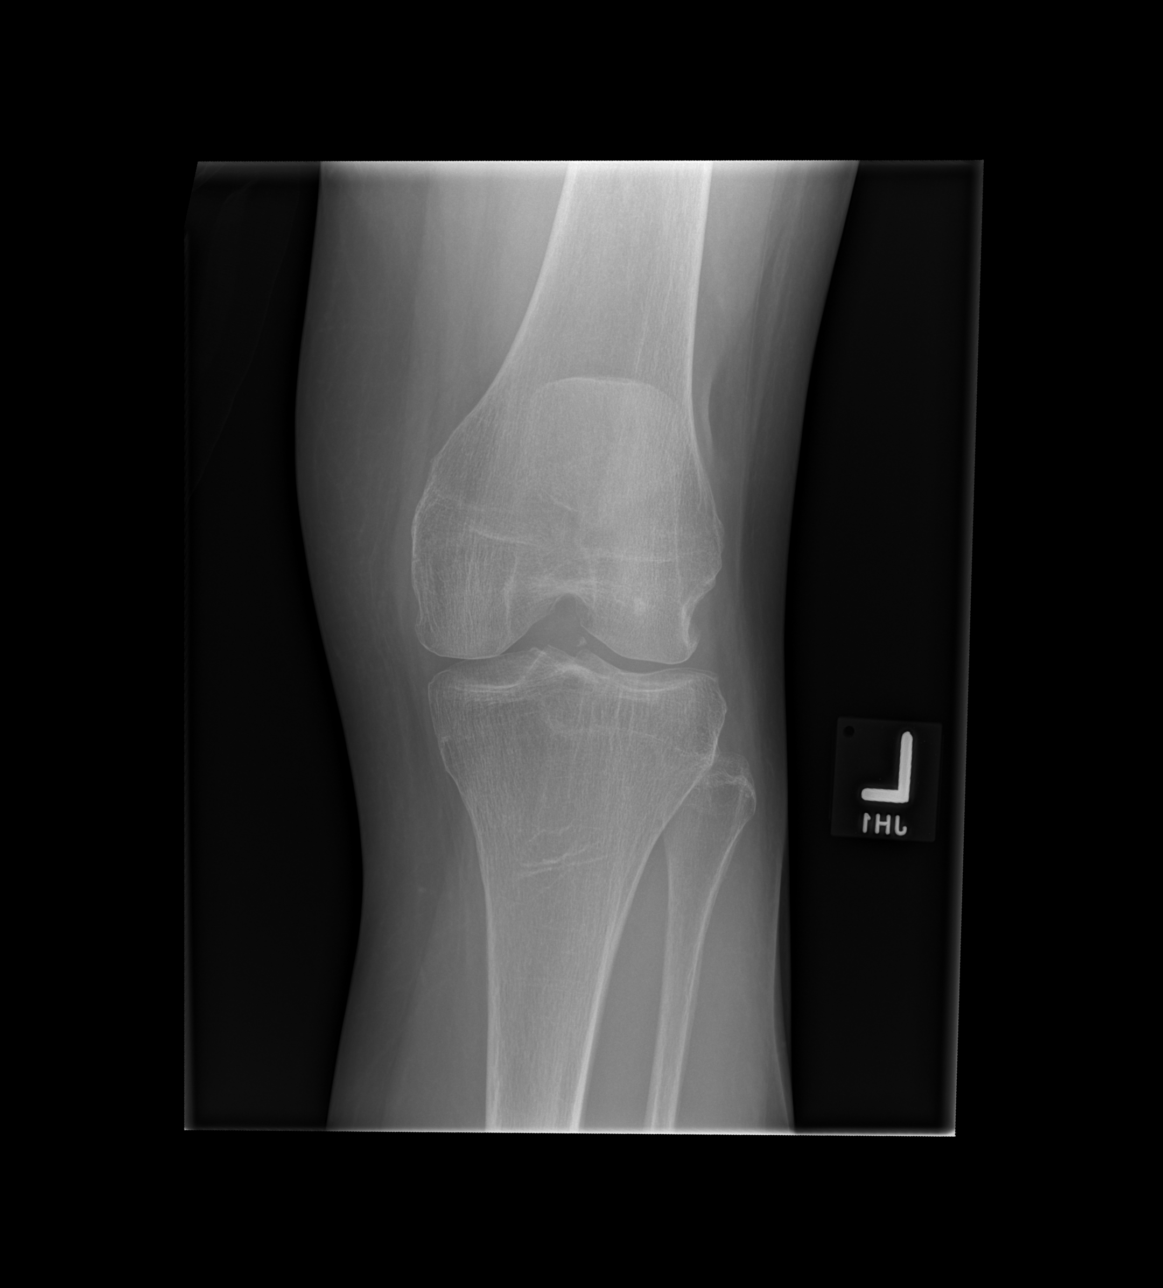

[x knee ap left (2 of 3)]
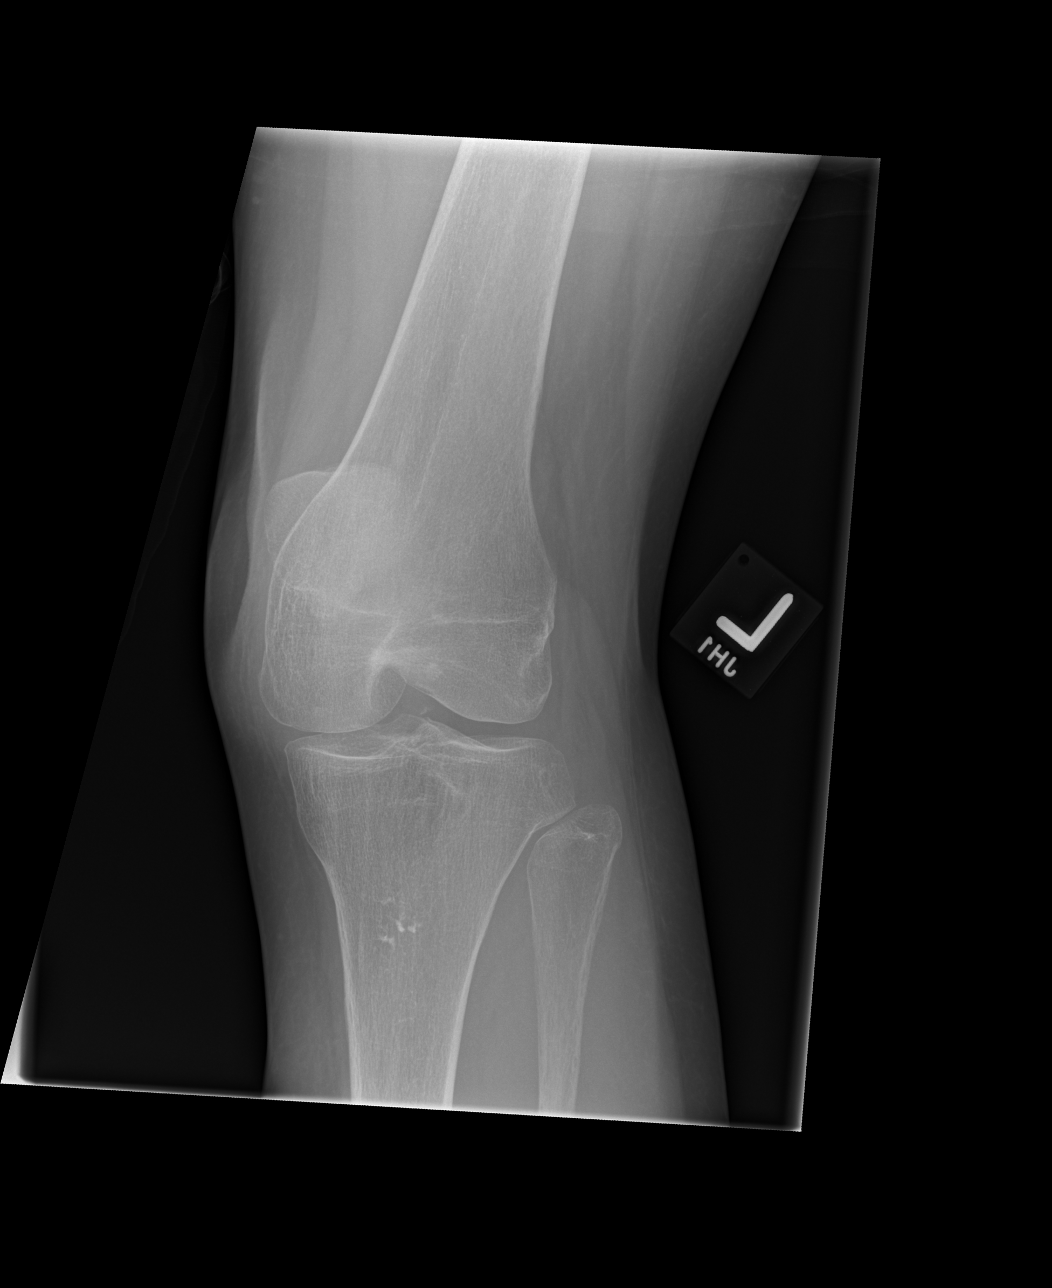

[x knee ap left (3 of 3)]
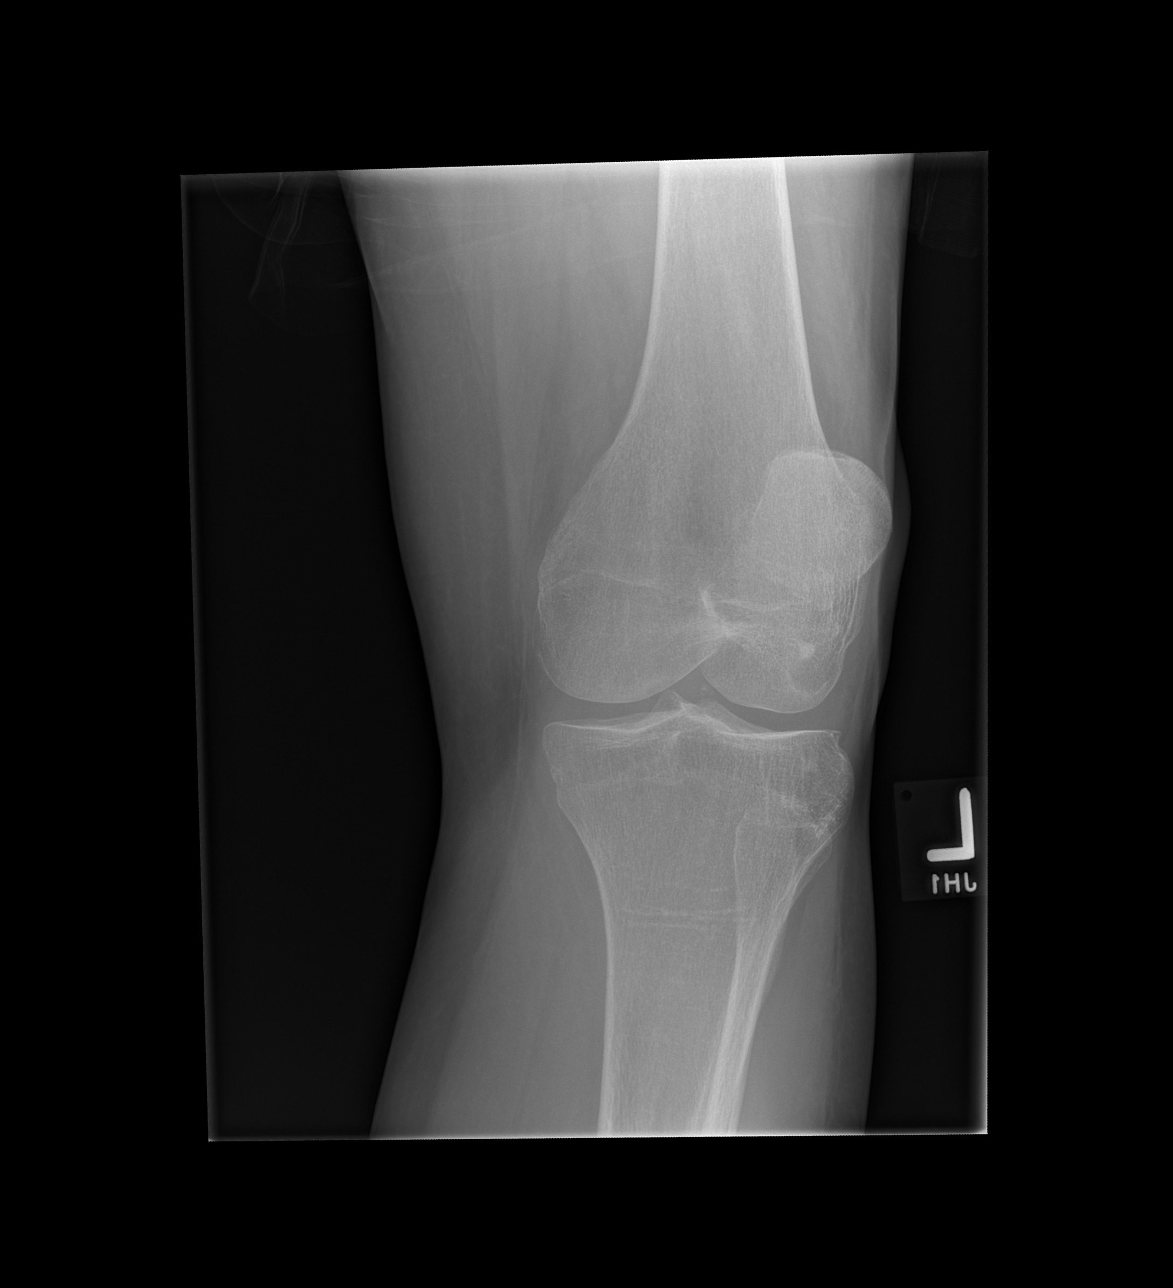

[x knee lat left]
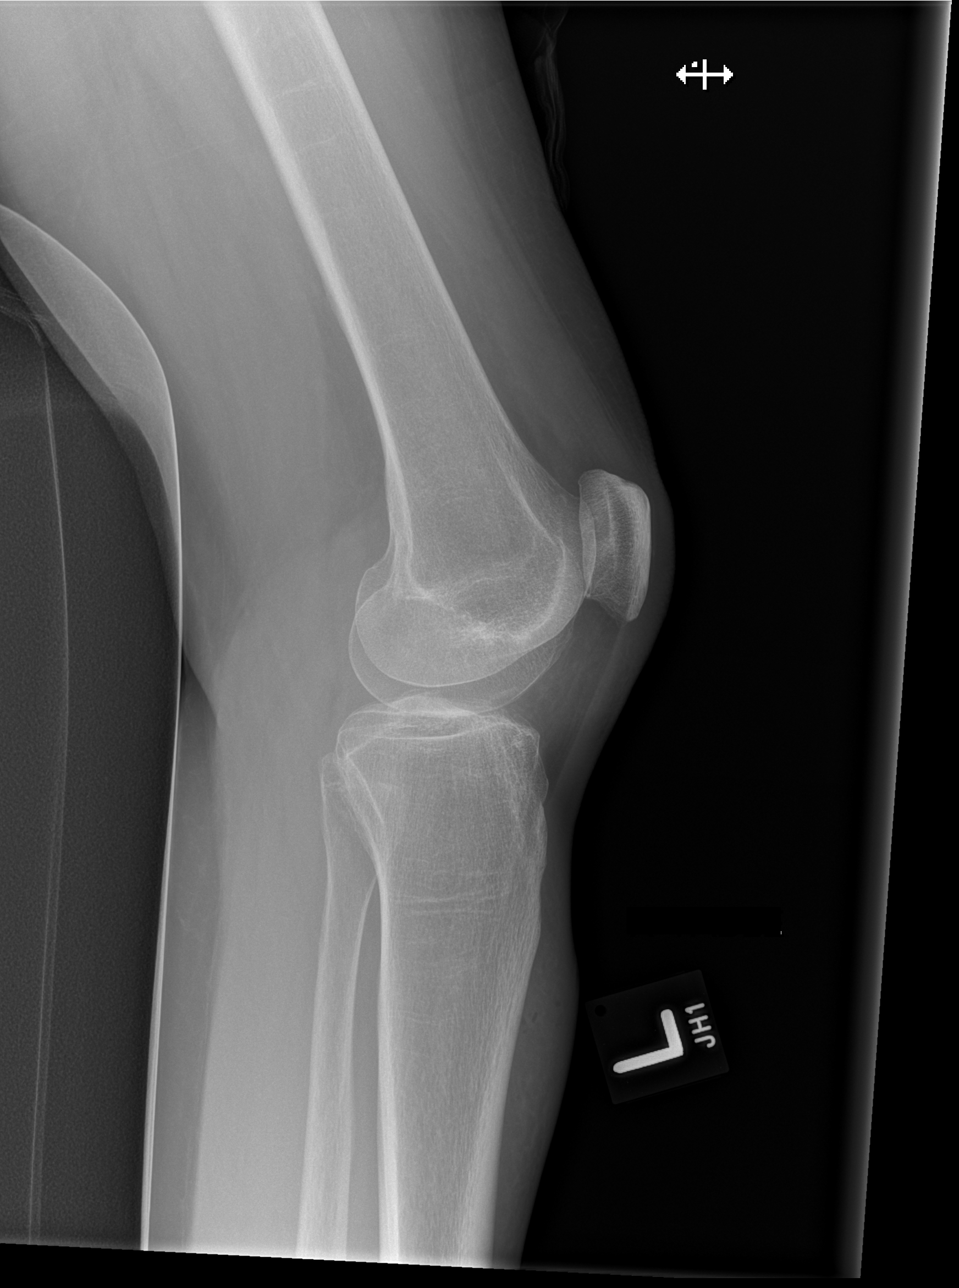

[4 of 4 positions shown; findings below may reference images not displayed]

FINDINGS: No evidence of fracture, dislocation, or joint effusion. My knee
medial tibiofemoral and patellofemoral spurring. No evidence of
arthropathy or other focal bone abnormality. Soft tissues are
unremarkable.
IMPRESSION: 1. No fracture or subluxation of the left knee.
2. Mild osteoarthritis.

## 2020-12-14 ENCOUNTER — Telehealth: Payer: Medicare PPO

## 2020-12-16 ENCOUNTER — Telehealth: Payer: Self-pay | Admitting: Adult Health

## 2020-12-16 NOTE — Telephone Encounter (Signed)
Patient's husband(Joseph) called in today stating that she is not well. States she is confused and upset. Not thinking clearly and seeing and imagining things. Got up this morning and went outside and wandered around neighbors saw her and brought her home. He says he is at lost of what to do and almost took her the emergency room this morning.  Pls RTC [phone] Jomarie Longs)

## 2020-12-17 NOTE — Telephone Encounter (Signed)
Left message to continue to monitor pt and if symptoms worsen call back with an update

## 2020-12-17 NOTE — Telephone Encounter (Signed)
Noted. Will discuss after Urology appointment - symptoms likely related to onging UTI's.

## 2020-12-17 NOTE — Telephone Encounter (Signed)
Rtc to Jomarie Longs, he reports [name] is better today. He reports they have a lot going on and there is a lot of chaos. They just moved out of their house on Friday, then stayed in their condo over the weekend, and on Tuesday moved into their house. Obviously may have enhanced her symptoms, she walked out the front door at 6 am yesterday, luckily their were neighbors to talk to her. Lots of confusion and seeing things that aren't there her husband reports, but her symptoms are improved and she slept better last night. Advised him to continue to monitor her symptoms but it may just exacerbated her symptoms with all the changes. He reports they have apt with urology on 12/23/2020. Informed him I would update Rene Kocher and see if she recommends any changes.

## 2020-12-17 NOTE — Telephone Encounter (Signed)
Just now reviewing message from 7/20, will contact husband this morning to check on pt. Does not look like there has been any ER visits.

## 2020-12-17 NOTE — Telephone Encounter (Signed)
Noted. She came into the office that way recently and I sent her to the PCP and she had a UTI. She is getting those frequently and his an upcoming urology appointment.

## 2020-12-17 NOTE — Telephone Encounter (Signed)
Husband left message yesterday @ 4:31pm, stating he had left previous message and had not received a call back. [phone] Jomarie Longs

## 2020-12-18 ENCOUNTER — Ambulatory Visit (INDEPENDENT_AMBULATORY_CARE_PROVIDER_SITE_OTHER): Payer: Medicare PPO

## 2020-12-18 ENCOUNTER — Telehealth: Payer: Self-pay | Admitting: Neurology

## 2020-12-18 DIAGNOSIS — F419 Anxiety disorder, unspecified: Secondary | ICD-10-CM

## 2020-12-18 DIAGNOSIS — G20 Parkinson's disease: Secondary | ICD-10-CM

## 2020-12-18 DIAGNOSIS — M810 Age-related osteoporosis without current pathological fracture: Secondary | ICD-10-CM

## 2020-12-18 DIAGNOSIS — R296 Repeated falls: Secondary | ICD-10-CM | POA: Diagnosis not present

## 2020-12-18 NOTE — Chronic Care Management (AMB) (Signed)
Chronic Care Management   CCM RN Visit Note  12/18/2020 Name: Kelly Howard MRN: [id number] DOB: 22-Jul-1950  Subjective: Kelly Howard is a 70 y.o. year old female who is a primary care patient of Philip Aspen, Limmie Patricia, MD. The care management team was consulted for assistance with disease management and care coordination needs.    Engaged with patient by telephone for follow up visit in response to provider referral for case management and/or care coordination services.   Consent to Services:  The patient was given information about Chronic Care Management services, agreed to services, and gave verbal consent prior to initiation of services.  Please see initial visit note for detailed documentation.   Patient agreed to services and verbal consent obtained.   Assessment: Review of patient past medical history, allergies, medications, health status, including review of consultants reports, laboratory and other test data, was performed as part of comprehensive evaluation and provision of chronic care management services.   SDOH (Social Determinants of Health) assessments and interventions performed:    CCM Care Plan  Allergies  Allergen Reactions   Codeine Other (See Comments)    GI Upset   Escitalopram Swelling    Foot swelling   Propoxyphene Other (See Comments)    Hallucinations    Outpatient Encounter Medications as of 12/18/2020  Medication Sig   Acetaminophen (TYLENOL PO) Take 500 mg by mouth as needed.   AMBULATORY NON FORMULARY MEDICATION U step walker  Dx: G20   AMBULATORY NON FORMULARY MEDICATION Abdominal compression binder Dx: G20   carbidopa-levodopa (SINEMET CR) 50-200 MG tablet TAKE 1 TABLET BY MOUTH EVERYDAY AT BEDTIME   carbidopa-levodopa (SINEMET IR) 25-100 MG tablet TAKE 1.5 TABLETS BY MOUTH 5 (FIVE) TIMES DAILY. 6am/9am/noon/3pm/5pm   clonazePAM (KLONOPIN) 0.5 MG tablet Take 1 tablet (0.5 mg total) by mouth 3 (three) times daily as needed  for anxiety.   ibandronate (BONIVA) 150 MG tablet Take 150 mg by mouth every 30 (thirty) days.   levothyroxine (SYNTHROID, LEVOTHROID) 50 MCG tablet Take 50 mcg by mouth daily before breakfast.   sertraline (ZOLOFT) 100 MG tablet TAKE ONE AND 1/2 TABLETS DAILY.   No facility-administered encounter medications on file as of 12/18/2020.    Patient Active Problem List   Diagnosis Date Noted   Vitamin D deficiency 07/10/2019   Vitamin B12 deficiency 07/10/2019   Hypothyroidism    Frequent falls    Osteoporosis    Closed fracture of right distal radius 12/25/2018   Parkinson disease (HCC) 10/20/2011    Conditions to be addressed/monitored:Anxiety and Parkinson disease, osteoporosis, frequent falls  Care Plan : RNCM:Osteoporosis (Adult)  Updates made by Yetta Glassman, RN since 12/18/2020 12:00 AM     Problem: Risk of harm or Injury related to hx of frequent falls and osteoporosis   Priority: High     Long-Range Goal: Harm or Injury Prevented   Start Date: 08/27/2020  Expected End Date: 11/27/2020  This Visit's Progress: On track  Recent Progress: Not on track  Priority: High  Note:   Current Barriers:  Knowledge Deficits related to fall precautions in patient with osteoporosis and Parkinson's disease pt has had over 4 falls in last year, States they moved into their new home about 3 weeks ago and she is trying to get settled.  States it has been over a month that she had a fall.  States she is trying to use her walker more.   Decreased adherence to prescribed treatment for fall prevention Does  not adhere to provider recommendations re: use of walker Does not adhere to prescribed psychotropic medication regimen Unable to perform IADLs independently-family assists as needed Knowledge Deficits related to fall prevention and safety Chronic Disease Management support and education needs related to fall prevention and safety, osteoporosis Clinical Goal(s):  patient will demonstrate  improved adherence to prescribed treatment plan for decreasing falls as evidenced by patient reporting and review of EMR patient will verbalize using fall risk reduction strategies discussed patient will not experience additional falls patient will verbalize understanding of plan for fall prevention and safety patient will meet with RN Care Manager to address educational needs for fall prevention and osteoporosis patient will attend all scheduled medical appointments: no scheduled primary care visit, neurology 11/12/20 patient will demonstrate improved adherence to prescribed treatment plan for fall prevention as evidenced by reported fewer falls Interventions:  Collaboration with Philip Aspen, Limmie Patricia, MD regarding development and update of comprehensive plan of care as evidenced by provider attestation and co-signature Inter-disciplinary care team collaboration (see longitudinal plan of care) Reviewed medications and discussed potential side effects of medications such as dizziness and frequent urination Assessed for falls since last encounter. Reinforced fall risk prevention secondary to previously provided education. Evaluation of current treatment plan related to fall safety and patient's adherence to plan as established by provider. Reinforced to use walker at all times Reinforced education to patient re: fall prevention, safety and osteoporosis  Reviewed scheduled/upcoming provider appointments including: Dr. Arbutus Leas 06/10/21 no current primary care provider appt scheduled Discussed plans with patient for ongoing care management follow up and provided patient with direct contact information for care management team Reinforced to try to time her activities when she is feeling stronger and to use her seated walker so she can rest if needed Reinforced to drink adequate amounts of fluids and to get up slowly if she feels dizzy Self-Care Deficits:  Does not adhere to provider recommendations  re: use of walker Unable to perform IADLs independently Patient Goals:  - Utilize walker (assistive device) appropriately with all ambulation - De-clutter walkways - Change positions slowly - Wear secure fitting shoes at all times with ambulation - Utilize home lighting for dim lit areas - Demonstrate self and pet awareness at all times - always use handrails on the stairs - always wear shoes or slippers with non-slip sole - get at least 10 minutes of activity every day - install bathroom grab bars - keep a flashlight by the bed - keep cell phone with me always - make an emergency alert plan in case I fall - pick up clutter from the floors - remove, or use a non-slip pad, with my throw rugs - use a cane or walker at all times - use a nightlight in the bathroom Follow Up Plan: Telephone follow up appointment with care management team member scheduled for: 01/19/21 at 11:30 AM The patient has been provided with contact information for the care management team and has been advised to call with any health related questions or concerns.      Care Plan : RNCM: Parkinson's disease  Updates made by Yetta Glassman, RN since 12/18/2020 12:00 AM     Problem: Lack of coping skills for Parkinson's disease   Priority: Medium     Long-Range Goal: Improved coping skills for Parkinson's disease   Start Date: 08/27/2020  Expected End Date: 11/27/2020  This Visit's Progress: On track  Recent Progress: Not on track  Priority: Medium  Note:  Current Barriers:  Ineffective Self Health Maintenance for Parkinson's disease  Does not adhere to provider recommendations re:  Unable to perform IADLs independently Pt has had issues with frequent falls and coping with her disease, she is seeing her  counselor regularly, States she is still working on cutting back on the clonazepam she takes with her counselor. She continues to go to [employer] 1-2 times a week for Parkinson's exercise class, rides exercise  bike and does weights, reports that she had a fall about 3 -4 weeks ago.  States she is trying to use her walker more since she has moved into her new home  States she saw Dr. Arbutus Leas and she referred her to see a urologist.  States she has a urology appt next week.  States she still has issues with getting up at night frequently to urinate but states sometimes it is better. Denies any swallowing issues at this time. Clinical Goal(s):  Collaboration with Philip Aspen, Limmie Patricia, MD regarding development and update of comprehensive plan of care as evidenced by provider attestation and co-signature Inter-disciplinary care team collaboration (see longitudinal plan of care) patient will work with care management team to address care coordination and chronic disease management needs related to Disease Management Educational Needs   Interventions:  Evaluation of current treatment plan related to  Parkinson's disease , ADL IADL limitations and Mental Health Concerns  self-management and patient's adherence to plan as established by provider. Collaboration with Philip Aspen, Limmie Patricia, MD regarding development and update of comprehensive plan of care as evidenced by provider attestation       and co-signature Inter-disciplinary care team collaboration (see longitudinal plan of care) Discussed plans with patient for ongoing care management follow up and provided patient with direct contact information for care management team Reinforced to continue to follow up with neurology and counselor  Reinforced to continue going to gym for Parkinson's classes Reinforced on s/sx of UTI to call provider and encouraged to empty bladder frequently and to drink adequate amounts of fluids Reviewed to notify MD if she has swallowing issues or cough Reviewed to keep urology appointment Self Care Activities:  Patient verbalizes understanding of plan to improve coping skills and self management of Parkinson's disease Self  administers medications as prescribed Attends all scheduled provider appointments Calls provider office for new concerns or questions Patient Goals: - repeat what I heard to make sure I understand - bring a list of my medicines to the visit - speak up when I don't understand - keep a calendar with appointment dates - make a list of family or friends that I can call - learn and use visualization or guided imagery - practice relaxation or meditation daily - start or continue a personal journal - talk about feelings with a friend, family or spiritual advisor -use walker at all times Call to schedule appt with provider  Follow Up Plan: Telephone follow up appointment with care management team member scheduled for: 12/14/20 at 10 AM The patient has been provided with contact information for the care management team and has been advised to call with any health related questions or concerns.       Plan:Telephone follow up appointment with care management team member scheduled for:  8/23/2 and The patient has been provided with contact information for the care management team and has been advised to call with any health related questions or concerns.  Dudley Major RN, BSN,CCM, CDE Care Management Coordinator [facility] [phone], Mobile (  [phone]          

## 2020-12-18 NOTE — Patient Instructions (Signed)
Visit Information  PATIENT GOALS:  Goals Addressed             This Visit's Progress    Manage My Emotions   On track    Timeframe:  Long-Range Goal Priority:  Medium Start Date:      08/27/20                       Expected End Date:       04/29/21                Follow Up Date 01/19/21    - learn and use visualization or guided imagery - practice relaxation or meditation daily - start or continue a personal journal - talk about feelings with a friend, family or spiritual advisor  -continue to work with counselor on stress and anxiety   Why is this important?   When you are stressed, down or upset, your body reacts too.  For example, your blood pressure may get higher; you may have a headache or stomachache.  When your emotions get the best of you, your body's ability to fight off cold and flu gets weak.  These steps will help you manage your emotions.     Notes:      Prevent Falls and Broken Bones-Osteoporosis   On track    Timeframe:  Long-Range Goal Priority:  High Start Date:     08/27/20                        Expected End Date:   04/29/21                    Follow Up Date 01/19/21    - always use handrails on the stairs - always wear shoes or slippers with non-slip sole - get at least 10 minutes of activity every day - install bathroom grab bars - keep a flashlight by the bed - keep cell phone with me always - make an emergency alert plan in case I fall - pick up clutter from the floors - remove, or use a non-slip pad, with my throw rugs - use a cane or walker - use a nightlight in the bathroom    Why is this important?   When you fall, there are 3 things that control if a bone breaks or not.  These are the fall itself, how hard and the direction that you fall and how fragile your bones are.  Preventing falls is very important for you because of fragile bones.     Notes:         Patient verbalizes understanding of instructions provided today and agrees  to view in MyChart.   Telephone follow up appointment with care management team member scheduled for: 01/19/21  Dudley Major RN, [facility], CDE Care Management Coordinator [facility] [phone], Mobile [phone]

## 2020-12-18 NOTE — Telephone Encounter (Signed)
They need to take her to ER or UC

## 2020-12-18 NOTE — Telephone Encounter (Signed)
Called and spoke to patients daughter Chrissie and informed her that patient needs to go to the ER to be evaluated and treated right away. Also informed patients daughter that if patient has not followed up with PCP to do so.   Patients daughter verbalized understanding and will take patient to the ER to be evaluated.   Patients daughter verbalized understanding and had no further questions or concerns.

## 2020-12-21 ENCOUNTER — Telehealth: Payer: Self-pay | Admitting: Adult Health

## 2020-12-21 NOTE — Telephone Encounter (Signed)
I spoke with him last week. Pt is scheduled to see Urology this week.

## 2020-12-21 NOTE — Telephone Encounter (Signed)
Noted  

## 2020-12-21 NOTE — Telephone Encounter (Signed)
Kelly Howard, [name]'s husband called. He is listed on the [facility]. He said that [name] continues to be super confused and has psychoses. They moved recently to [facility] on 7/19 and this seems to be a lot of the confusion. What else can they do for her? His phone number is [phone]?

## 2020-12-25 DIAGNOSIS — R351 Nocturia: Secondary | ICD-10-CM | POA: Diagnosis not present

## 2020-12-25 DIAGNOSIS — R350 Frequency of micturition: Secondary | ICD-10-CM | POA: Diagnosis not present

## 2020-12-30 ENCOUNTER — Telehealth: Payer: Self-pay | Admitting: Adult Health

## 2020-12-30 NOTE — Telephone Encounter (Signed)
Patient's husband Jomarie Longs) LVM regarding an update on urologist visit last Friday. States he would like to discuss visit with RM with next steps with current medications. Pls RTC ASAP [phone]

## 2020-12-30 NOTE — Telephone Encounter (Signed)
LVM to return call.

## 2020-12-31 ENCOUNTER — Telehealth: Payer: Self-pay | Admitting: Neurology

## 2020-12-31 NOTE — Telephone Encounter (Signed)
Called and spoke with husband

## 2020-12-31 NOTE — Telephone Encounter (Signed)
Pt husband needs to speak to someone about what is going with her. She is having confusion and anxiety. They have recently moved about 2 weeks ago.  Please call husband

## 2020-12-31 NOTE — Telephone Encounter (Signed)
No answer at 1:53 12/31/2020

## 2020-12-31 NOTE — Telephone Encounter (Signed)
A recent move can do this due to unfamilar environment.  This patient has terrible anxiety (sees psychiatry) and baseline confusion.  Needs to f/u psychiatry.  Make sure no new focal/lateralizing sx's.

## 2020-12-31 NOTE — Telephone Encounter (Signed)
Pt left another message stating that he got the message. He would like regina to call him back at [phone]

## 2020-12-31 NOTE — Telephone Encounter (Signed)
Jomarie Longs stated the urologist has diagnosed Kelly Howard with bladder spasms and has started her on Myrbetriq.He also wanted you to be aware that he does not see a change in her mental state and thinks she needs to be further evaluated by a psychiatrist.She still has some confusion and her anxiety is not being managed by the clonazepam.

## 2021-01-01 NOTE — Telephone Encounter (Signed)
Husband advised, he wants to talk to [facility] regarding some assitance with resources.

## 2021-01-06 ENCOUNTER — Telehealth: Payer: Self-pay | Admitting: Internal Medicine

## 2021-01-06 ENCOUNTER — Encounter (HOSPITAL_COMMUNITY): Payer: Self-pay

## 2021-01-06 ENCOUNTER — Other Ambulatory Visit: Payer: Self-pay

## 2021-01-06 ENCOUNTER — Emergency Department (HOSPITAL_COMMUNITY)
Admission: EM | Admit: 2021-01-06 | Discharge: 2021-01-07 | Disposition: A | Discharge status: Home / Self Care | Payer: Medicare PPO | Attending: Emergency Medicine | Admitting: Emergency Medicine

## 2021-01-06 DIAGNOSIS — D649 Anemia, unspecified: Secondary | ICD-10-CM | POA: Diagnosis not present

## 2021-01-06 DIAGNOSIS — Z20822 Contact with and (suspected) exposure to covid-19: Secondary | ICD-10-CM | POA: Insufficient documentation

## 2021-01-06 DIAGNOSIS — E039 Hypothyroidism, unspecified: Secondary | ICD-10-CM | POA: Diagnosis not present

## 2021-01-06 DIAGNOSIS — G20 Parkinson's disease: Secondary | ICD-10-CM | POA: Diagnosis not present

## 2021-01-06 DIAGNOSIS — F419 Anxiety disorder, unspecified: Secondary | ICD-10-CM | POA: Diagnosis not present

## 2021-01-06 DIAGNOSIS — Z79899 Other long term (current) drug therapy: Secondary | ICD-10-CM | POA: Diagnosis not present

## 2021-01-06 DIAGNOSIS — F0280 Dementia in other diseases classified elsewhere without behavioral disturbance: Secondary | ICD-10-CM | POA: Insufficient documentation

## 2021-01-06 DIAGNOSIS — R410 Disorientation, unspecified: Secondary | ICD-10-CM | POA: Insufficient documentation

## 2021-01-06 DIAGNOSIS — R443 Hallucinations, unspecified: Secondary | ICD-10-CM

## 2021-01-06 DIAGNOSIS — F29 Unspecified psychosis not due to a substance or known physiological condition: Secondary | ICD-10-CM | POA: Insufficient documentation

## 2021-01-06 DIAGNOSIS — R9431 Abnormal electrocardiogram [ECG] [EKG]: Secondary | ICD-10-CM | POA: Diagnosis not present

## 2021-01-06 LAB — CBC WITH DIFFERENTIAL/PLATELET
Abs Immature Granulocytes: 0.02 10*3/uL (ref 0.00–0.07)
Basophils Absolute: 0 10*3/uL (ref 0.0–0.1)
Basophils Relative: 0 %
Eosinophils Absolute: 0 10*3/uL (ref 0.0–0.5)
Eosinophils Relative: 1 %
HCT: 35 % — ABNORMAL LOW (ref 36.0–46.0)
Hemoglobin: 11.1 g/dL — ABNORMAL LOW (ref 12.0–15.0)
Immature Granulocytes: 0 %
Lymphocytes Relative: 15 %
Lymphs Abs: 0.7 10*3/uL (ref 0.7–4.0)
MCH: 29.4 pg (ref 26.0–34.0)
MCHC: 31.7 g/dL (ref 30.0–36.0)
MCV: 92.8 fL (ref 80.0–100.0)
Monocytes Absolute: 0.5 10*3/uL (ref 0.1–1.0)
Monocytes Relative: 11 %
Neutro Abs: 3.3 10*3/uL (ref 1.7–7.7)
Neutrophils Relative %: 73 %
Platelets: 272 10*3/uL (ref 150–400)
RBC: 3.77 MIL/uL — ABNORMAL LOW (ref 3.87–5.11)
RDW: 13.1 % (ref 11.5–15.5)
WBC: 4.6 10*3/uL (ref 4.0–10.5)
nRBC: 0 % (ref 0.0–0.2)

## 2021-01-06 LAB — RESP PANEL BY RT-PCR (FLU A&B, COVID) ARPGX2
Influenza A by PCR: NEGATIVE
Influenza B by PCR: NEGATIVE
SARS Coronavirus 2 by RT PCR: NEGATIVE

## 2021-01-06 LAB — COMPREHENSIVE METABOLIC PANEL
ALT: <5 U/L (ref 0–44)
AST: 17 U/L (ref 15–41)
Albumin: 4.7 g/dL (ref 3.5–5.0)
Alkaline Phosphatase: 54 U/L (ref 38–126)
Anion gap: 11 (ref 5–15)
BUN: 19 mg/dL (ref 8–23)
CO2: 26 mmol/L (ref 22–32)
Calcium: 9 mg/dL (ref 8.9–10.3)
Chloride: 104 mmol/L (ref 98–111)
Creatinine, Ser: 0.61 mg/dL (ref 0.44–1.00)
GFR, Estimated: >60 mL/min (ref >60)
Glucose, Bld: 112 mg/dL — ABNORMAL HIGH (ref 70–99)
Potassium: 3.7 mmol/L (ref 3.5–5.1)
Sodium: 141 mmol/L (ref 135–145)
Total Bilirubin: 0.5 mg/dL (ref 0.3–1.2)
Total Protein: 7.4 g/dL (ref 6.5–8.1)

## 2021-01-06 LAB — ETHANOL: Alcohol, Ethyl (B): <10 mg/dL (ref <10)

## 2021-01-06 LAB — RAPID URINE DRUG SCREEN, HOSP PERFORMED
Amphetamines: NOT DETECTED
Barbiturates: NOT DETECTED
Benzodiazepines: NOT DETECTED
Cocaine: NOT DETECTED
Opiates: NOT DETECTED
Tetrahydrocannabinol: NOT DETECTED

## 2021-01-06 NOTE — ED Triage Notes (Signed)
Husband reports AMS, Auditory and visual hallucinations of seeing people not that and insomnia d/t hallucinations. Hx of parkinson.

## 2021-01-06 NOTE — ED Provider Notes (Signed)
[facility]ENC[facility]PT Provider Note   CSN: [id number] Arrival date & time: 01/06/21  1552     History Chief Complaint  Patient presents with   Hallucinations    Kelly Howard is a 70 y.o. female.  HPI  Patient with a history of Parkinson's dementia presents with hallucinations.  She has a history of hallucinations, but these worsened a few weeks ago when she moved into a new home with her husband.  She is having paranoid thoughts that her daughter is trying to plot against her.  She is also seeing shadows and hearing voices that are not there.  This is happening daily, and has not improved with neurology follow-up.  She has been confused, paranoid, and fearful.  She is very anxious compared to her baseline, this is only worsening.  Her husband is very concerned that this has not been getting much better, they have tried following up with neurology and psychiatry but but have been having difficulty getting prompt care.  She has a history of terminal anxiety and baseline confusion per [facility] psychiatry.  Despite this, her husband states things are getting worse.  Past Medical History:  Diagnosis Date   Frequent falls    Hypothyroidism    Osteoporosis    Parkinson's disease [facility])     Patient Active Problem List   Diagnosis Date Noted   Vitamin D deficiency 07/10/2019   Vitamin B12 deficiency 07/10/2019   Hypothyroidism    Frequent falls    Osteoporosis    Closed fracture of right distal radius 12/25/2018   Parkinson disease (HCC) 10/20/2011    Past Surgical History:  Procedure Laterality Date   TONSILLECTOMY     WRIST SURGERY       OB History   No obstetric history on file.     Family History  Problem Relation Age of Onset   Hypercalcemia Mother    Cancer Paternal Grandmother    Diabetes Paternal Grandfather     Social History   Tobacco Use   Smoking status: Never   Smokeless tobacco: Never  Vaping Use   Vaping Use:  Never used  Substance Use Topics   Alcohol use: Yes   Drug use: Never    Home Medications Prior to Admission medications   Medication Sig Start Date End Date Taking? Authorizing Provider  Acetaminophen (TYLENOL PO) Take 500 mg by mouth as needed.    [provider]  AMBULATORY NON FORMULARY MEDICATION U step walker  Dx: G20 06/10/19   Tat, Octaviano Batty, DO  AMBULATORY NON FORMULARY MEDICATION Abdominal compression binder Dx: G20 11/07/19   Tat, Octaviano Batty, DO  carbidopa-levodopa (SINEMET CR) 50-200 MG tablet TAKE 1 TABLET BY MOUTH EVERYDAY AT BEDTIME 11/19/20   Tat, [name] S, DO  carbidopa-levodopa (SINEMET IR) 25-100 MG tablet TAKE 1.5 TABLETS BY MOUTH 5 (FIVE) TIMES DAILY. 6am/9am/noon/3pm/5pm 11/12/20   Tat, Octaviano Batty, DO  clonazePAM (KLONOPIN) 0.5 MG tablet Take 1 tablet (0.5 mg total) by mouth 3 (three) times daily as needed for anxiety. 11/13/20   Mozingo, Thereasa Solo, NP  ibandronate (BONIVA) 150 MG tablet Take 150 mg by mouth every 30 (thirty) days. 08/12/19   [provider]  levothyroxine (SYNTHROID, LEVOTHROID) 50 MCG tablet Take 50 mcg by mouth daily before breakfast. 03/09/12   [provider]  sertraline (ZOLOFT) 100 MG tablet TAKE ONE AND 1/2 TABLETS DAILY. 11/13/20   Mozingo, Thereasa Solo, NP    Allergies    Codeine, Escitalopram, and Propoxyphene  Review of Systems   Review of Systems  Constitutional:  Negative for fever.  Respiratory:  Negative for shortness of breath.   Cardiovascular:  Negative for chest pain.  Gastrointestinal:  Negative for nausea and vomiting.  Genitourinary:  Positive for frequency. Negative for dysuria.  Neurological:  Negative for speech difficulty and headaches.  Psychiatric/Behavioral:  Positive for confusion, hallucinations and sleep disturbance. The patient is nervous/anxious.    Physical Exam Updated Vital Signs BP 121/70   Pulse 82   Temp 98 F (36.7 C)   Resp 20   Ht 5\' 5"  (1.651 m)   Wt 59 kg   LMP   (LMP Unknown) Comment: gyn  SpO2 100%   BMI 21.64 kg/m   Physical Exam Vitals and nursing note reviewed. Exam conducted with a chaperone present.  Constitutional:      General: She is not in acute distress.    Appearance: Normal appearance.  HENT:     Head: Normocephalic and atraumatic.  Eyes:     General: No scleral icterus.    Extraocular Movements: Extraocular movements intact.     Pupils: Pupils are equal, round, and reactive to light.  Cardiovascular:     Rate and Rhythm: Normal rate and regular rhythm.  Pulmonary:     Effort: Pulmonary effort is normal.     Breath sounds: Normal breath sounds.  Skin:    Coloration: Skin is not jaundiced.  Neurological:     Mental Status: She is alert. Mental status is at baseline.     Coordination: Coordination normal.     Comments: Cranial nerves III through XII are grossly intact.  She has a shuffling gait and some tremor in her hands bilaterally.    ED Results / Procedures / Treatments   Labs (all labs ordered are listed, but only abnormal results are displayed) Labs Reviewed  COMPREHENSIVE METABOLIC PANEL - Abnormal; Notable for the following components:      Result Value   Glucose, Bld 112 (*)    All other components within normal limits  CBC WITH DIFFERENTIAL/PLATELET - Abnormal; Notable for the following components:   RBC 3.77 (*)    Hemoglobin 11.1 (*)    HCT 35.0 (*)    All other components within normal limits  RESP PANEL BY RT-PCR (FLU A&B, COVID) ARPGX2  ETHANOL  RAPID URINE DRUG SCREEN, HOSP PERFORMED    EKG None  Radiology No results found.  Procedures Procedures   Medications Ordered in ED Medications - No data to display  ED Course  I have reviewed the triage vital signs and the nursing notes.  Pertinent labs & imaging results that were available during my care of the patient were reviewed by me and considered in my medical decision making (see chart for details).  Clinical Course as of 01/06/21  1836  Wed Jan 06, 2021  1836 Comprehensive metabolic panel(!) No electrolyte derangement [HS]  1836 CBC with Diff(!) Mild anemia, but not grossly divergent from baseline. [HS]    Clinical Course User Index [HS] Jan 08, 2021, PA-C   MDM Rules/Calculators/A&P                           Per chart review, patient has a history of terminal anxiety and baseline confusion.  This has been worsening since the move, and although neurology provided resources there seems to be a communication bridge between the patient family and her outpatient care team.  I agree that inpatient  psych evaluation would be advisable.  Her lab work has been unremarkable, her vitals are stable.  She is appropriate for psych evaluation at this time.   Final Clinical Impression(s) / ED Diagnoses Final diagnoses:  None    Rx / DC Orders ED Discharge Orders     None        Theron Arista, Cordelia Poche 01/06/21 1836    Mancel Bale, MD 01/07/21 0001

## 2021-01-06 NOTE — Telephone Encounter (Signed)
PT spouse called wanting to speak to the nurse in regards to some confusion that the PT is having. PT spouse states that the PT has Parkinson and has some questions to ask the nurse. PT spouse states that it is very important.

## 2021-01-06 NOTE — ED Provider Notes (Signed)
Emergency Medicine Provider Triage Evaluation Note  Kelly Howard , a 70 y.o. female  was evaluated in triage.  Pt complains of hallucinations.  History of Parkinson's, she has been having increasing confusion and hallucinations.  She suspects that her daughter is trying to hurt her, that other people are trying to hurt her.  She is also hearing things that are not there and overly fearful.  She has seen a urologist recently due to of multiple UTIs, had appointment neurology 2 weeks ago.  She has not seen a psychiatrist recently and think needs a psychiatric evaluation..  Review of Systems  Positive: Hallucinations, paranoia, fearful, confusions Negative: SOB, chest pain  Physical Exam  BP 121/70   Pulse 82   Temp 98 F (36.7 C)   Resp 20   Ht 5\' 5"  (1.651 m)   Wt 59 kg   LMP  (LMP Unknown) Comment: gyn  SpO2 100%   BMI 21.64 kg/m  Gen:   Awake, no distress   Resp:  Normal effort  MSK:   Moves extremities without difficulty  Other:    Medical Decision Making  Medically screening exam initiated at 4:33 PM.  Appropriate orders placed.  was informed that the remainder of the evaluation will be completed by another provider, this initial triage assessment does not replace that evaluation, and the importance of remaining in the ED until their evaluation is complete.     [name], PA-C 01/06/21 1635    03/08/21, MD 01/06/21 [phone]

## 2021-01-07 ENCOUNTER — Other Ambulatory Visit: Payer: Self-pay

## 2021-01-07 ENCOUNTER — Ambulatory Visit (HOSPITAL_COMMUNITY)
Admission: EM | Admit: 2021-01-07 | Discharge: 2021-01-07 | Disposition: A | Discharge status: Home / Self Care | Payer: Medicare PPO

## 2021-01-07 DIAGNOSIS — R410 Disorientation, unspecified: Secondary | ICD-10-CM

## 2021-01-07 LAB — URINALYSIS, ROUTINE W REFLEX MICROSCOPIC
Bacteria, UA: NONE SEEN
Bilirubin Urine: NEGATIVE
Glucose, UA: NEGATIVE mg/dL
Ketones, ur: 5 mg/dL — AB
Leukocytes,Ua: NEGATIVE
Nitrite: NEGATIVE
Protein, ur: NEGATIVE mg/dL
Specific Gravity, Urine: 1.019 (ref 1.005–1.030)
pH: 5 (ref 5.0–8.0)

## 2021-01-07 NOTE — ED Notes (Signed)
Pt continues to be fidgety, ambulating around the room, going into bathroom.  Sitter remains with pt for safety.  Await TTS

## 2021-01-07 NOTE — Telephone Encounter (Signed)
Patient is currently admitted to the hospital.

## 2021-01-07 NOTE — ED Notes (Signed)
Lunch tray was given.  Pt was also offered shower, change of scrubs earlier which she refused

## 2021-01-07 NOTE — ED Notes (Signed)
Spouse asked if pt had her home medications which she did not.  Note written to counselor per spouse request.

## 2021-01-07 NOTE — Discharge Instructions (Addendum)
For your behavioral health needs you are advised to follow up with your current outpatient providers.

## 2021-01-07 NOTE — ED Notes (Signed)
Pt spouse called back due to pt being very confused.  Pt was very confused while in our care this am.  Spouse states that pt is much more confused when he picked her up this afternoon than she even was when he brought her in.

## 2021-01-07 NOTE — ED Notes (Signed)
Walked pt out to her husband who was waiting for her.  Pt was calm and cooperative and able to walk well.

## 2021-01-07 NOTE — BH Assessment (Signed)
[facility] Assessment Progress Note  Per Dorena Bodo, NP, this voluntary pt does not require psychiatric hospitalization at this time.  Pt is psychiatrically cleared.  Discharge instructions advise pt to continue treatment with her current outpatient providers.  At the request of Melynda Ripple, TS, this writer called pt's husband, Liliyana Thobe ([phone]) to explain disposition to him.  I informed him that pt is not a danger to herself or others at this time, and therefore does not require psychiatric hospitalization.  He expressed his dissatisfaction with psychiatry and neurology services that pt is currently receiving.  I recommended that he discuss these concerns with the current providers, and if necessary to ask them for referrals.  At his insistence I provided the names and phone numbers for a couple of local physicians that specialize in geriatric psychiatry, but urged him to pursue these only in consultation with pt's current providers.  He reports that he will present at [facility] to pick pt up between 13:00 and 13:30.  EDP Pieter Partridge, MD and pt's nurse, Alvino Chapel, have been notified.  Doylene Canning, MA Triage Specialist [phone]
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
## 2021-01-07 NOTE — BH Assessment (Signed)
Comprehensive Clinical Assessment (CCA) Note  01/07/2021 Kelly Howard [id number]  Disposition: TTS completed. Per Pecolia Ades, NP, patient is psych cleared. Patient to be provided recommendations for outpatient psychiatrist, per request of spouse. Discussed patient's disposition recommendations with Disposition Counselor Jalene Mullet) who will assist with providing referrals to patient and spouse. [place]-SUICIDE SEVERITY RATING SCALE (C-SSRS) completed and patient scored, "No Risk".   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 Acute risk factors for suicide include:  Medical concerns due to Parkinsons Diagnosis  . Protective factors for this patient include: positive social support, positive therapeutic relationship, and responsibility to others (children, family). Considering these factors, the overall suicide risk at this point appears to be "No Risk". Patient is appropriate for outpatient follow up.   Flowsheet Row ED from 01/06/2021 in [place] DEPT  C-SSRS RISK CATEGORY No Risk        Chief Complaint:  Chief Complaint  Patient presents with   Hallucinations   Visit Diagnosis: General Medical Condition; Psychotic Disorder Not Otherwise Specified, Anxiety Disorder.  Upon chart review: "Kelly Howard , a 70 y.o. female  was evaluated in triage.  Pt complains of hallucinations.  History of Parkinson's, she has been having increasing confusion and hallucinations.  She suspects that her daughter is trying to hurt her, that other people are trying to hurt her.  She is also hearing things that are not there and overly fearful.  She has seen a urologist recently due to of multiple UTIs, had appointment neurology 2 weeks ago.  She has not seen a psychiatrist recently and  think needs a psychiatric evaluation."  Clinician met with patient to complete a TTS consult via tele assessment. Upon initial reaction with patient she appears anxious, confused, and repeatedly asking to go to the restroom. She asked to go to the rest room after 1 minute into starting the assessment. Upon her return from the restroom, she immediately asked to go back to the restroom. Staff encouraged patient to participate in the assessment as her nurse reported she had already been to the restroom multiple times this morning. Seemingly with support from staff to participate in the assessment she was cooperative with answering questions.  Clinician asked patient what brought her to the Emergency Department. Patient states, "I don't know, I don't remember". States that her spouse transported her to the Emergency Room. She was observed looking around the room as she appears to be responding to internal stimuli and/or confused. Patient oriented to person, time, place and situation.   Patient denies suicidal ideations. Denies prior history of suicide attempts and/or gestures. Denies hx of self mutilating behaviors. Patient acknowledges a history of depression. However, unable to explain any related symptoms. States that she has taken Clonzapam in the past to help with her depressive symptoms. Appetite is reported as good. Denies weight loss and/or gain. She sleeps ok and reports 7-8 hrs of sleep per night. Also, acknowledges a history of anxiety and panic attacks.   Patient identifies her doctors as her support system. Denies a history of abuse and/or trauma.  Denies a history of homicidal ideations. She denies current and history of auditory hallucincations. Denies visual halluicnations.    CCA Screening, Triage and Referral (STR)  Patient Reported Information How did you hear about [place]? No data recorded What Is the Reason for Your Visit/Call Today?  Kelly Howard , a 70 y.o. female  was  evaluated in triage.  Pt complains of hallucinations.  History of Parkinson's, she has been having increasing confusion and hallucinations.  She suspects that her daughter is trying to hurt her, that other people are trying to hurt her.  She is also hearing things that are not there and overly fearful.  She has seen a urologist recently due to of multiple UTIs, had appointment neurology 2 weeks ago.  She has not seen a psychiatrist recently and think needs a psychiatric evaluation..  How Long Has This Been Causing You Problems? > than 6 months  What Do You Feel Would Help You the Most Today? Treatment for Depression or other mood problem; Stress Management; Medication(s)   Have You Recently Had Any Thoughts About Hurting Yourself? No  Are You Planning to Commit Suicide/Harm Yourself At This time? No   Have you Recently Had Thoughts About [place]? No  Are You Planning to Harm Someone at This Time? No  Explanation: No data recorded  Have You Used Any Alcohol or Drugs in the Past 24 Hours? No  How Long Ago Did You Use Drugs or Alcohol? No data recorded What Did You Use and How Much? No data recorded  Do You Currently Have a Therapist/Psychiatrist? Yes  Name of Therapist/Psychiatrist: [facility]   Have You Been Recently Discharged From Any Office Practice or Programs? No  Explanation of Discharge From Practice/Program: No data recorded    CCA Screening Triage Referral Assessment Type of Contact: Tele-Assessment  Telemedicine Service Delivery:   Is this Initial or Reassessment? Initial Assessment  Date Telepsych consult ordered in [facility]:  01/07/21  Time Telepsych consult ordered in [facility]:  No data recorded Location of Assessment: [facility]  Provider Location: [facility]   Collateral Involvement: Yes. Spouse/ [phone] [place])   Does Patient Have a Stage manager Guardian? No data recorded Name and Contact of Legal Guardian: No  data recorded If Minor and Not Living with Parent(s), Who has Custody? No data recorded Is CPS involved or ever been involved? Never  Is APS involved or ever been involved? Never   Patient Determined To Be At Risk for Harm To Self or Others Based on Review of Patient Reported Information or Presenting Complaint? No data recorded Method: No data recorded Availability of Means: No data recorded Intent: No data recorded Notification Required: No data recorded Additional Information for Danger to Others Potential: No data recorded Additional Comments for Danger to Others Potential: No data recorded Are There Guns or Other Weapons in Your Home? No data recorded Types of Guns/Weapons: No data recorded Are These Weapons Safely Secured?                            No data recorded Who Could Verify You Are Able To Have These Secured: No data recorded Do You Have any Outstanding Charges, Pending Court Dates, Parole/Probation? No data recorded Contacted To Inform of Risk of Harm To Self or Others: No data recorded   Does Patient Present under Involuntary Commitment? No  IVC Papers Initial File Date: No data recorded  [place] of Residence: [place]   Patient Currently Receiving the Following Services: Individual Therapy; Medication Management   Determination of Need: Emergent (2 hours)   Options For Referral: Medication Management; Outpatient Therapy     CCA Biopsychosocial Patient Reported Schizophrenia/Schizoaffective Diagnosis in Past: No  Strengths: No data recorded  Mental Health Symptoms Depression:   Change in energy/activity   Duration of Depressive symptoms:  Duration of Depressive Symptoms: Greater than two weeks   Mania:   None   Anxiety:    Restlessness; Sleep; Tension; Worrying; Irritability; Fatigue; Difficulty concentrating   Psychosis:  No data recorded  Duration of Psychotic symptoms:    Trauma:   N/A   Obsessions:   N/A   Compulsions:   N/A    Inattention:   N/A   Hyperactivity/Impulsivity:   N/A   Oppositional/Defiant Behaviors:   N/A   Emotional Irregularity:   N/A   Other Mood/Personality Symptoms:  No data recorded   Mental Status Exam Appearance and self-care  Stature:   Small   Weight:   Thin   Clothing:   Age-appropriate   Grooming:   Normal   Cosmetic use:   Age appropriate   Posture/gait:   Normal   Motor activity:   Not Remarkable   Sensorium  Attention:   Normal   Concentration:   Normal   Orientation:   Time; Situation; Place; Person; Object   Recall/memory:   Normal   Affect and Mood  Affect:   Depressed; Flat   Mood:   Depressed   Relating  Eye contact:   Normal   Facial expression:   Depressed   Attitude toward examiner:   Cooperative   Thought and Language  Speech flow:  Clear and Coherent   Thought content:   Appropriate to Mood and Circumstances   Preoccupation:   Other (Comment) (Fearful and paranoid)   Hallucinations:   Other (Comment)   Organization:  No data recorded  [employer] of Knowledge:   Average   Intelligence:   Average   Abstraction:   Abstract   Judgement:   Impaired   Reality Testing:   Adequate   Insight:   Lacking; Gaps; Poor   Decision Making:   Confused   Social Functioning  Social Maturity:   -- (n/a)   Social Judgement:   Normal   Stress  Stressors:  No data recorded  Coping Ability:   Normal   Skill Deficits:   Communication   Supports:   Family     Religion: Religion/Spirituality Are You A Religious Person?: No  Leisure/Recreation: Leisure / Recreation Do You Have Hobbies?: Yes Leisure and Hobbies: Riding a excercise bike; previously was taking a swim class  Exercise/Diet: Exercise/Diet Do You Exercise?: No Have You Gained or Lost A Significant Amount of Weight in the Past Six Months?: No Do You Follow a Special Diet?: No Do You Have Any Trouble Sleeping?:  Yes Explanation of Sleeping Difficulties: Spouse states that patient doesn't sleep for more than 2-3 hrs at at time.   CCA Employment/Education Employment/Work Situation: Employment / Work Situation Employment Situation: Unemployed Patient's Job has Been Impacted by Current Illness: No Has Patient ever Been in Passenger transport manager?: No  Education: Education Is Patient Currently Attending School?: No Did Physicist, medical?: No (unknown) Did You Have An Individualized Education Program (IIEP): No Did You Have Any Difficulty At [employer]?: No Patient's Education Has Been Impacted by Current Illness: No   CCA Family/Childhood History Family and Relationship History: Family history Marital status: Single Does patient have children?: Yes How many children?: 2 How is patient's relationship with their children?: 2 daughters; very involved and helpful with patient's care.  Childhood History:  Childhood History By whom was/is the patient raised?:  (unknown) Did patient  suffer any verbal/emotional/physical/sexual abuse as a child?: No Did patient suffer from severe childhood neglect?: No Has patient ever been sexually abused/assaulted/raped as an adolescent or adult?: No Was the patient ever a victim of a crime or a disaster?: No Witnessed domestic violence?: No Has patient been affected by domestic violence as an adult?: No  Child/Adolescent Assessment:     CCA Substance Use Alcohol/Drug Use: Alcohol / Drug Use Pain Medications: SEE MAR Prescriptions: SEE MAR Over the Counter: SEE MAR History of alcohol / drug use?: No history of alcohol / drug abuse                         ASAM's:  Six Dimensions of Multidimensional Assessment  Dimension 1:  Acute Intoxication and/or Withdrawal Potential:      Dimension 2:  Biomedical Conditions and Complications:      Dimension 3:  Emotional, Behavioral, or Cognitive Conditions and Complications:     Dimension 4:  Readiness to  Change:     Dimension 5:  Relapse, Continued use, or Continued Problem Potential:     Dimension 6:  Recovery/Living Environment:     ASAM Severity Score:    ASAM Recommended Level of Treatment:     Substance use Disorder (SUD)    Recommendations for Services/Supports/Treatments: Recommendations for Services/Supports/Treatments Recommendations For Services/Supports/Treatments: Medication Management, Other (Comment) (Outpatient therapy)  Discharge Disposition:    DSM5 Diagnoses: Patient Active Problem List   Diagnosis Date Noted   Vitamin D deficiency 07/10/2019   Vitamin B12 deficiency 07/10/2019   Hypothyroidism    Frequent falls    Osteoporosis    Closed fracture of right distal radius 12/25/2018   Parkinson disease ([place]) 10/20/2011     Referrals to Alternative Service(s): Referred to Alternative Service(s):   Place:   Date:   Time:    Referred to Alternative Service(s):   Place:   Date:   Time:    Referred to Alternative Service(s):   Place:   Date:   Time:    Referred to Alternative Service(s):   Place:   Date:   Time:     Waldon Merl, Counselor

## 2021-01-07 NOTE — BH Assessment (Signed)
At [phone], Clinician followed up with patient's nurse Delorse Limber, RN) via secure chat to inquire about patient's availability to be seen by TTS.

## 2021-01-07 NOTE — BH Assessment (Addendum)
At 02:58 clinician called nurses station in [place].  Was informed by [facility] that patient was sleeping comfortably.  TTS to attempt to see pt when she is alert.

## 2021-01-07 NOTE — ED Notes (Signed)
Pt is up and ambulating in room, sitter keeping her safe at bedside.  Spoke with spouse on the phone and updated him.

## 2021-01-07 NOTE — Discharge Instructions (Addendum)
Please call Dr. Donell Beers to schedule an appointment as soon as possible to help manage the mental health component of [name]'s care.

## 2021-01-07 NOTE — ED Notes (Signed)
Pt continues to go to the bathroom and fidget in room, she needs frequent redirection

## 2021-01-07 NOTE — BH Assessment (Signed)
TTS triage: Patient presents to [facility] with her husband, Kelly Howard, and sister in law, Kelly Howard. Patient renders limited history secondary to Parkinsons dementia. Patient was seen at [facility] ED earlier this date but family is concerned because she did not see a psychiatrist. Patient denies current SI/HI/AVH. Patient's family explained patient is suffering from increased confusion, paranoia, and AH at times. They state she has neurologist and they have been trying to get her in with the psychiatrist at [facility], but they are not accepting new patients at this time.  Patient had full TTS evaluation earlier this date. Patient is routine.

## 2021-01-07 NOTE — ED Provider Notes (Signed)
ToldPatient has been cleared from a psychiatric standpoint.  She has outpatient psychiatry providers, and her husband has been following with these providers.   Koleen Distance, MD 01/07/21 [phone]

## 2021-01-08 ENCOUNTER — Telehealth: Payer: Self-pay | Admitting: Internal Medicine

## 2021-01-08 NOTE — Telephone Encounter (Signed)
Patient spouse Kyanne Rials) wants to speak with Fleet Contras and give her background information before Mrs.Chenard next visit.  Patient spouse would like to be contacted at [phone].  Please advise.

## 2021-01-08 NOTE — Telephone Encounter (Signed)
Spoke with husband and he is requesting a referral to a  geriatric psychiatrist.  He has a couple doctors names and will call back Monday with more information.

## 2021-01-11 ENCOUNTER — Telehealth: Payer: Self-pay

## 2021-01-11 DIAGNOSIS — G20 Parkinson's disease: Secondary | ICD-10-CM

## 2021-01-11 NOTE — Telephone Encounter (Signed)
Patient's husband called to give referral Dr's information Dr.Polvski [phone] Dr. Tyrell Antonio [phone]

## 2021-01-12 NOTE — Telephone Encounter (Signed)
Husband is aware that referral has been placed. 

## 2021-01-12 NOTE — Telephone Encounter (Signed)
Referral placed.

## 2021-01-12 NOTE — Addendum Note (Signed)
Addended by: Kern Reap B on: 01/12/2021 09:10 AM   Modules accepted: Orders

## 2021-01-13 ENCOUNTER — Telehealth: Payer: Self-pay | Admitting: Internal Medicine

## 2021-01-13 ENCOUNTER — Ambulatory Visit: Payer: Medicare PPO | Admitting: Internal Medicine

## 2021-01-13 DIAGNOSIS — G20 Parkinson's disease: Secondary | ICD-10-CM

## 2021-01-13 NOTE — Telephone Encounter (Signed)
The patient's husband is requesting a referral for his wife patient to see another Neurologist for ) - Parkinson's disease.not satisfied  with [facility]

## 2021-01-13 NOTE — Telephone Encounter (Signed)
Referral placed.

## 2021-01-19 ENCOUNTER — Ambulatory Visit (INDEPENDENT_AMBULATORY_CARE_PROVIDER_SITE_OTHER): Payer: Medicare PPO

## 2021-01-19 ENCOUNTER — Encounter: Payer: Self-pay | Admitting: *Deleted

## 2021-01-19 ENCOUNTER — Telehealth: Payer: Self-pay | Admitting: *Deleted

## 2021-01-19 DIAGNOSIS — M810 Age-related osteoporosis without current pathological fracture: Secondary | ICD-10-CM | POA: Diagnosis not present

## 2021-01-19 DIAGNOSIS — R296 Repeated falls: Secondary | ICD-10-CM | POA: Diagnosis not present

## 2021-01-19 DIAGNOSIS — G20A1 Parkinson's disease without dyskinesia, without mention of fluctuations: Secondary | ICD-10-CM

## 2021-01-19 DIAGNOSIS — G20 Parkinson's disease: Secondary | ICD-10-CM | POA: Diagnosis not present

## 2021-01-19 NOTE — Patient Instructions (Signed)
Visit Information  PATIENT GOALS:  Goals Addressed             This Visit's Progress    RNCM:Manage My Emotions   On track    Timeframe:  Long-Range Goal Priority:  Medium Start Date:      08/27/20                       Expected End Date:       04/29/21                Follow Up Date 02/23/21    - learn and use visualization or guided imagery - practice relaxation or meditation daily - start or continue a personal journal - talk about feelings with a friend, family or spiritual advisor  -continue to work with counselor on stress and anxiety   Why is this important?   When you are stressed, down or upset, your body reacts too.  For example, your blood pressure may get higher; you may have a headache or stomachache.  When your emotions get the best of you, your body's ability to fight off cold and flu gets weak.  These steps will help you manage your emotions.     Notes:      RNCM:Prevent Falls and Broken Bones-Osteoporosis   On track    Timeframe:  Long-Range Goal Priority:  High Start Date:     08/27/20                        Expected End Date:   04/29/21                    Follow Up Date 02/23/21    - always use handrails on the stairs - always wear shoes or slippers with non-slip sole - get at least 10 minutes of activity every day - install bathroom grab bars - keep a flashlight by the bed - keep cell phone with me always - make an emergency alert plan in case I fall - pick up clutter from the floors - remove, or use a non-slip pad, with my throw rugs - use a cane or walker - use a nightlight in the bathroom    Why is this important?   When you fall, there are 3 things that control if a bone breaks or not.  These are the fall itself, how hard and the direction that you fall and how fragile your bones are.  Preventing falls is very important for you because of fragile bones.     Notes:         Patient verbalizes understanding of instructions provided today  and agrees to view in MyChart.   Telephone follow up appointment with care management team member scheduled for: 02/23/21 at 11:30 AM  Dudley Major RN, Maximiano Coss, CDE Care Management Coordinator [facility] [phone], Mobile [phone]

## 2021-01-19 NOTE — Chronic Care Management (AMB) (Signed)
  Chronic Care Management   Note  01/19/2021 Name: Kelly Howard MRN: [id number] DOB: 14-Nov-1950  Kelly Howard is a 70 y.o. year old female who is a primary care patient of Philip Aspen, Limmie Patricia, MD. Kelly Howard is currently enrolled in care management services. An additional referral for SW was placed.   Follow up plan: Unsuccessful telephone outreach attempt made. A HIPAA compliant phone message was left for the patient providing contact information and requesting a return call. The care management team will reach out to the patient again over the next 7 days. If patient returns call to provider office, please advise to call Embedded Care Management Care Guide Misty Stanley at [phone].  Kelly Howard  Care Guide, Embedded Care Coordination [facility] Management  Direct Dial: [phone]

## 2021-01-19 NOTE — Chronic Care Management (AMB) (Signed)
Chronic Care Management   CCM RN Visit Note  01/19/2021 Name: Kelly Howard Lemaster MRN: [id number] DOB: 01/11/1951  Subjective: Kelly Howard is a 70 y.o. year old female who is a primary care patient of Philip AspenHernandez Acosta, Limmie PatriciaEstela Y, MD. The care management team was consulted for assistance with disease management and care coordination needs.    Engaged with patient by telephone for follow up visit in response to provider referral for case management and/or care coordination services.   Consent to Services:  The patient was given information about Chronic Care Management services, agreed to services, and gave verbal consent prior to initiation of services.  Please see initial visit note for detailed documentation.   Patient agreed to services and verbal consent obtained.   Assessment: Review of patient past medical history, allergies, medications, health status, including review of consultants reports, laboratory and other test data, was performed as part of comprehensive evaluation and provision of chronic care management services.   SDOH (Social Determinants of Health) assessments and interventions performed:    CCM Care Plan  Allergies  Allergen Reactions   Codeine Other (See Comments)    GI Upset   Escitalopram Swelling and Other (See Comments)    Feet swell   Propoxyphene Other (See Comments)    Hallucinations    Outpatient Encounter Medications as of 01/19/2021  Medication Sig Note   acetaminophen (TYLENOL) 325 MG tablet Take 325-650 mg by mouth every 6 (six) hours as needed for mild pain.    AMBULATORY NON FORMULARY MEDICATION U step walker  Dx: G20 (Patient taking differently: See admin instructions. U step walker- Dx: G20)    AMBULATORY NON FORMULARY MEDICATION Abdominal compression binder Dx: G20    carbidopa-levodopa (SINEMET CR) 50-200 MG tablet TAKE 1 TABLET BY MOUTH EVERYDAY AT BEDTIME (Patient taking differently: Take 1 tablet by mouth at bedtime.)     carbidopa-levodopa (SINEMET IR) 25-100 MG tablet TAKE 1.5 TABLETS BY MOUTH 5 (FIVE) TIMES DAILY. 6am/9am/noon/3pm/5pm (Patient taking differently: Take 1.5 tablets by mouth See admin instructions. Take 1.5 tablets by mouth at 6 AM, 9 AM, 12 NOON, 3 PM, and 6 PM)    clonazePAM (KLONOPIN) 0.5 MG tablet Take 1 tablet (0.5 mg total) by mouth 3 (three) times daily as needed for anxiety. (Patient taking differently: Take 0.25 mg by mouth See admin instructions. Take 0.25 mg by mouth four to five times a day as needed for anxiousness)    ibandronate (BONIVA) 150 MG tablet Take 150 mg by mouth every 30 (thirty) days.    levothyroxine (SYNTHROID, LEVOTHROID) 50 MCG tablet Take 50 mcg by mouth daily before breakfast.    MYRBETRIQ 50 MG TB24 tablet Take 50 mg by mouth in the morning. 01/06/2021: The patient is receiving samples of this med at the present time   sertraline (ZOLOFT) 100 MG tablet TAKE ONE AND 1/2 TABLETS DAILY. (Patient taking differently: Take 100 mg by mouth at bedtime.)    No facility-administered encounter medications on file as of 01/19/2021.    Patient Active Problem List   Diagnosis Date Noted   Vitamin D deficiency 07/10/2019   Vitamin B12 deficiency 07/10/2019   Hypothyroidism    Frequent falls    Osteoporosis    Closed fracture of right distal radius 12/25/2018   Parkinson disease (HCC) 10/20/2011    Conditions to be addressed/monitored: Parkinson's disease, frequent falls, osteoporosis  Care Plan : RNCM:Osteoporosis (Adult)  Updates made by Yetta GlassmanSandlin, Patryck Kilgore J, RN since 01/19/2021 12:00 AM  Problem: Risk of harm or Injury related to hx of frequent falls and osteoporosis   Priority: High     Long-Range Goal: Harm or Injury Prevented   Start Date: 08/27/2020  Expected End Date: 05/30/2021  This Visit's Progress: On track  Recent Progress: On track  Priority: High  Note:   Current Barriers:  Knowledge Deficits related to fall precautions in patient with osteoporosis and  Parkinson's disease pt has had over 4 falls in last year,  Decreased adherence to prescribed treatment for fall prevention Does not adhere to provider recommendations re: use of walker Does not adhere to prescribed psychotropic medication regimen Unable to perform IADLs independently-family assists as needed Knowledge Deficits related to fall prevention and safety Chronic Disease Management support and education needs related to fall prevention and safety, osteoporosis Spoke with husband Jose Alleyne.  States that pt has had one fall in the last month.  States that her new home is better set up for her and she is using her walker more.  States that they are not leaving her alone and their daughter is staying with them to help him Clinical Goal(s):  patient will demonstrate improved adherence to prescribed treatment plan for decreasing falls as evidenced by patient reporting and review of EMR patient will verbalize using fall risk reduction strategies discussed patient will not experience additional falls patient will verbalize understanding of plan for fall prevention and safety patient will meet with RN Care Manager to address educational needs for fall prevention and osteoporosis patient will attend all scheduled medical appointments: no scheduled primary care visit, neurology 01/26/21 patient will demonstrate improved adherence to prescribed treatment plan for fall prevention as evidenced by reported fewer falls Interventions:  Collaboration with Philip Aspen, Limmie Patricia, MD regarding development and update of comprehensive plan of care as evidenced by provider attestation and co-signature Inter-disciplinary care team collaboration (see longitudinal plan of care) Reviewed medications and discussed potential side effects of medications such as dizziness and frequent urination Assessed for falls since last encounter. Reinforced fall risk prevention secondary to previously provided  education. Evaluation of current treatment plan related to fall safety and patient's adherence to plan as established by provider. Reinforced to use walker at all times Reinforced education to patient re: fall prevention, safety and osteoporosis  Reviewed scheduled/upcoming provider appointments including: Dr. Daphane Shepherd neurology 01/26/21  no current primary care provider appt scheduled Discussed plans with patient for ongoing care management follow up and provided patient with direct contact information for care management team Reinforced to try to time her activities when she is feeling stronger and to use her seated walker so she can rest if needed Reinforced to drink adequate amounts of fluids and to get up slowly if she feels dizzy Self-Care Deficits:  Does not adhere to provider recommendations re: use of walker Unable to perform IADLs independently Patient Goals:  - Utilize walker (assistive device) appropriately with all ambulation - De-clutter walkways - Change positions slowly - Wear secure fitting shoes at all times with ambulation - Utilize home lighting for dim lit areas - Demonstrate self and pet awareness at all times - always use handrails on the stairs - always wear shoes or slippers with non-slip sole - get at least 10 minutes of activity every day - install bathroom grab bars - keep a flashlight by the bed - keep cell phone with me always - make an emergency alert plan in case I fall - pick up clutter from the floors - remove, or  use a non-slip pad, with my throw rugs - use a cane or walker at all times - use a nightlight in the bathroom Follow Up Plan: Telephone follow up appointment with care management team member scheduled for: 02/23/21 at 11:30 AM The patient has been provided with contact information for the care management team and has been advised to call with any health related questions or concerns.      Care Plan : RNCM: Parkinson's disease  Updates made by  Yetta Glassman, RN since 01/19/2021 12:00 AM     Problem: Lack of coping skills for Parkinson's disease   Priority: Medium     Long-Range Goal: Improved coping skills for Parkinson's disease   Start Date: 08/27/2020  Expected End Date: 05/30/2021  Recent Progress: On track  Priority: Medium  Note:   Current Barriers:  Ineffective Self Health Maintenance for Parkinson's disease  Does not adhere to provider recommendations re:  Unable to perform IADLs independently Spoke with husband Carlyne Keehan.  States that pt's hallucinations became worse after they moved into their new home.  States that he took her to the ED for evaluation but did not get any help.  States pt is going to see a new neurologist at [facility] to see if their concerns will be addressed.  States that pt did go to the urologist and she is now on a medication to help with her bladder spasms.  States he can not tell if it is helping yet.  States he is taking pt to the [facility] several times a week to keep up with her exercise.  Clinical Goal(s):  Collaboration with Philip Aspen, Limmie Patricia, MD regarding development and update of comprehensive plan of care as evidenced by provider attestation and co-signature Inter-disciplinary care team collaboration (see longitudinal plan of care) patient will work with care management team to address care coordination and chronic disease management needs related to Disease Management Educational Needs   Interventions:  Evaluation of current treatment plan related to  Parkinson's disease , ADL IADL limitations and Mental Health Concerns  self-management and patient's adherence to plan as established by provider. Collaboration with Philip Aspen, Limmie Patricia, MD regarding development and update of comprehensive plan of care as evidenced by provider attestation       and co-signature Inter-disciplinary care team collaboration (see longitudinal plan of care) Discussed plans with patient for ongoing  care management follow up and provided patient with direct contact information for care management team Reinforced to continue to follow up with new neurology and counselor  Reinforced to continue going to gym for Parkinson's classes Reinforced on s/sx of UTI to call provider and encouraged to empty bladder frequently and to drink adequate amounts of fluids Reviewed to notify MD if she has swallowing issues or cough Reviewed need respite care and discussed caregiver stress. Agreeable to referral to CCM LCSW for caregvier stress and respite care Self Care Activities:  Patient verbalizes understanding of plan to improve coping skills and self management of Parkinson's disease Self administers medications as prescribed Attends all scheduled provider appointments Calls provider office for new concerns or questions Patient Goals: - repeat what I heard to make sure I understand - bring a list of my medicines to the visit - speak up when I don't understand - keep a calendar with appointment dates - make a list of family or friends that I can call - learn and use visualization or guided imagery - practice relaxation or meditation daily - start or  continue a personal journal - talk about feelings with a friend, family or spiritual advisor -use walker at all times Call to schedule appt with provider  Follow Up Plan: Telephone follow up appointment with care management team member scheduled for: 02/23/21 at 11:30 AM The patient has been provided with contact information for the care management team and has been advised to call with any health related questions or concerns.       Plan:Telephone follow up appointment with care management team member scheduled for:  02/23/21 and The patient has been provided with contact information for the care management team and has been advised to call with any health related questions or concerns.  Dudley Major RN, Maximiano Coss, CDE Care Management  Coordinator [facility] [phone], Mobile [phone]

## 2021-01-25 NOTE — Chronic Care Management (AMB) (Signed)
  Chronic Care Management   Outreach Note  01/25/2021 Name: Jazia Faraci MRN: [id number] DOB: [birth year]  Estrella Deeds Freitas is a 70 y.o. year old female who is a primary care patient of Philip Aspen, Limmie Patricia, MD. I reached out to [name] by phone today in response to a referral sent by Ms. Estrella Deeds Sanjuan's PCP, Philip Aspen, Limmie Patricia, MD.      A second unsuccessful telephone outreach was attempted today. The patient was referred to the case management team for assistance with care management and care coordination.   Follow Up Plan: A HIPAA compliant phone message was left for the patient providing contact information and requesting a return call. The care management team will reach out to the patient again over the next 7 days.  If patient returns call to provider office, please advise to call Embedded Care Management Care Guide Misty Stanley at [phone].  Gwenevere Ghazi  Care Guide, Embedded Care Coordination [facility] Management  Direct Dial: [phone]

## 2021-01-26 DIAGNOSIS — R441 Visual hallucinations: Secondary | ICD-10-CM | POA: Diagnosis not present

## 2021-01-26 DIAGNOSIS — G20 Parkinson's disease: Secondary | ICD-10-CM | POA: Diagnosis not present

## 2021-01-27 ENCOUNTER — Other Ambulatory Visit: Payer: Self-pay | Admitting: Adult Health

## 2021-01-27 DIAGNOSIS — F411 Generalized anxiety disorder: Secondary | ICD-10-CM

## 2021-01-27 DIAGNOSIS — F331 Major depressive disorder, recurrent, moderate: Secondary | ICD-10-CM

## 2021-01-29 NOTE — Chronic Care Management (AMB) (Signed)
  Care Management   Note  01/29/2021 Name: Kelly Howard MRN: [id number] DOB: 1950/08/24  Kelly Howard is a 70 y.o. year old female who is a primary care patient of Philip Aspen, Limmie Patricia, MD and is actively engaged with the care management team. I reached out to [name] by phone today to assist with scheduling an initial visit with the Licensed Clinical Social Worker  Follow up plan: Telephone appointment with care management team member scheduled for:02/12/2021  [facility] Guide, Embedded Care Coordination [facility]  Care Management  Direct Dial: [phone]

## 2021-02-08 ENCOUNTER — Telehealth: Payer: Self-pay | Admitting: Psychiatry

## 2021-02-08 NOTE — Telephone Encounter (Signed)
Please call patient to discuss needs - it shouldn't make a difference if she is on an antipsychotic. Also confirm that they do not plan to see me again, so I can change status.

## 2021-02-08 NOTE — Telephone Encounter (Signed)
LVM to return call.

## 2021-02-08 NOTE — Telephone Encounter (Signed)
Kelly Howard,   Please call patient to advise.

## 2021-02-08 NOTE — Telephone Encounter (Signed)
Please review

## 2021-02-08 NOTE — Telephone Encounter (Signed)
Pt's husband called.  His wife has appt tomorrow at 8am with Mardelle Matte.  He wants to know if Mardelle Matte would still want to see her if she is now taking anti-psychotic drugs that were prescribed by another doctor he took her to at [facility].  He wants to tell Mardelle Matte what med she is taking and find out if he still needs them to come tomorrow before he drives here.  Pls call him back today.  He complain how hard it is to reach any of the providers here.  He advised Almira Coaster may also want to know.

## 2021-02-09 ENCOUNTER — Ambulatory Visit (INDEPENDENT_AMBULATORY_CARE_PROVIDER_SITE_OTHER): Payer: Medicare PPO | Admitting: Psychiatry

## 2021-02-09 ENCOUNTER — Other Ambulatory Visit: Payer: Self-pay

## 2021-02-09 DIAGNOSIS — F410 Panic disorder [episodic paroxysmal anxiety] without agoraphobia: Secondary | ICD-10-CM | POA: Diagnosis not present

## 2021-02-09 DIAGNOSIS — F331 Major depressive disorder, recurrent, moderate: Secondary | ICD-10-CM

## 2021-02-09 DIAGNOSIS — F29 Unspecified psychosis not due to a substance or known physiological condition: Secondary | ICD-10-CM

## 2021-02-09 DIAGNOSIS — E559 Vitamin D deficiency, unspecified: Secondary | ICD-10-CM

## 2021-02-09 DIAGNOSIS — G4700 Insomnia, unspecified: Secondary | ICD-10-CM

## 2021-02-09 DIAGNOSIS — E538 Deficiency of other specified B group vitamins: Secondary | ICD-10-CM

## 2021-02-09 DIAGNOSIS — G20 Parkinson's disease: Secondary | ICD-10-CM
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
## 2021-02-11 DIAGNOSIS — R351 Nocturia: Secondary | ICD-10-CM | POA: Diagnosis not present

## 2021-02-12 ENCOUNTER — Ambulatory Visit (INDEPENDENT_AMBULATORY_CARE_PROVIDER_SITE_OTHER): Payer: Medicare PPO | Admitting: Adult Health

## 2021-02-12 ENCOUNTER — Other Ambulatory Visit: Payer: Self-pay

## 2021-02-12 ENCOUNTER — Encounter: Payer: Self-pay | Admitting: *Deleted

## 2021-02-12 ENCOUNTER — Ambulatory Visit (INDEPENDENT_AMBULATORY_CARE_PROVIDER_SITE_OTHER): Payer: Medicare PPO | Admitting: *Deleted

## 2021-02-12 ENCOUNTER — Encounter: Payer: Self-pay | Admitting: Adult Health

## 2021-02-12 DIAGNOSIS — M810 Age-related osteoporosis without current pathological fracture: Secondary | ICD-10-CM

## 2021-02-12 DIAGNOSIS — G4700 Insomnia, unspecified: Secondary | ICD-10-CM

## 2021-02-12 DIAGNOSIS — R300 Dysuria: Secondary | ICD-10-CM

## 2021-02-12 DIAGNOSIS — F331 Major depressive disorder, recurrent, moderate: Secondary | ICD-10-CM

## 2021-02-12 DIAGNOSIS — F411 Generalized anxiety disorder: Secondary | ICD-10-CM

## 2021-02-12 DIAGNOSIS — G20 Parkinson's disease: Secondary | ICD-10-CM

## 2021-02-12 DIAGNOSIS — F29 Unspecified psychosis not due to a substance or known physiological condition: Secondary | ICD-10-CM | POA: Diagnosis not present

## 2021-02-12 DIAGNOSIS — F419 Anxiety disorder, unspecified: Secondary | ICD-10-CM

## 2021-02-12 DIAGNOSIS — F410 Panic disorder [episodic paroxysmal anxiety] without agoraphobia: Secondary | ICD-10-CM | POA: Diagnosis not present

## 2021-02-12 DIAGNOSIS — R296 Repeated falls: Secondary | ICD-10-CM

## 2021-02-12 NOTE — Chronic Care Management (AMB) (Signed)
Chronic Care Management    Clinical Social Work Note  02/12/2021 Name: Kelly Howard MRN: [id number] DOB: [birth year]  Kelly Howard is a 70 y.o. year old female who is a primary care patient of Philip Aspen, Limmie Patricia, MD. The CCM team was consulted to assist the patient with chronic disease management and/or care coordination needs related to: Level of Care Concerns, Mental Health Counseling and Resources, and Caregiver Stress.   Engaged with patient and husband by telephone for initial visit in response to provider referral for social work chronic care management and care coordination services.   Consent to Services:  The patient was given information about Chronic Care Management services, agreed to services, and gave verbal consent prior to initiation of services.  Please see initial visit note for detailed documentation.   Patient agreed to services and consent obtained.   Assessment: Review of patient past medical history, allergies, medications, and health status, including review of relevant consultants reports was performed today as part of a comprehensive evaluation and provision of chronic care management and care coordination services.     SDOH (Social Determinants of Health) assessments and interventions performed:  SDOH Interventions    Flowsheet Row Most Recent Value  SDOH Interventions   Food Insecurity Interventions Intervention Not Indicated  Financial Strain Interventions Intervention Not Indicated  Housing Interventions Intervention Not Indicated  Intimate Partner Violence Interventions Intervention Not Indicated  Physical Activity Interventions Intervention Not Indicated  Stress Interventions Intervention Not Indicated, Provide Counseling, Other (Comment)  [Already established with a psychiatrist and therapist]  Social Connections Interventions Intervention Not Indicated  Transportation Interventions Intervention Not Indicated        Advanced  Directives Status: See Care Plan for related entries.  CCM Care Plan  Allergies  Allergen Reactions   Codeine Other (See Comments)    GI Upset   Escitalopram Swelling and Other (See Comments)    Feet swell   Propoxyphene Other (See Comments)    Hallucinations    Outpatient Encounter Medications as of 02/12/2021  Medication Sig Note   acetaminophen (TYLENOL) 325 MG tablet Take 325-650 mg by mouth every 6 (six) hours as needed for mild pain.    AMBULATORY NON FORMULARY MEDICATION U step walker  Dx: G20 (Patient taking differently: See admin instructions. U step walker- Dx: G20)    AMBULATORY NON FORMULARY MEDICATION Abdominal compression binder Dx: G20    carbidopa-levodopa (SINEMET CR) 50-200 MG tablet TAKE 1 TABLET BY MOUTH EVERYDAY AT BEDTIME (Patient taking differently: Take 1 tablet by mouth at bedtime.)    carbidopa-levodopa (SINEMET IR) 25-100 MG tablet TAKE 1.5 TABLETS BY MOUTH 5 (FIVE) TIMES DAILY. 6am/9am/noon/3pm/5pm (Patient taking differently: Take 1.5 tablets by mouth See admin instructions. Take 1.5 tablets by mouth at 6 AM, 9 AM, 12 NOON, 3 PM, and 6 PM)    clonazePAM (KLONOPIN) 0.5 MG tablet Take 1 tablet (0.5 mg total) by mouth 3 (three) times daily as needed for anxiety. (Patient taking differently: Take 0.25 mg by mouth See admin instructions. Take 0.25 mg by mouth four to five times a day as needed for anxiousness)    ibandronate (BONIVA) 150 MG tablet Take 150 mg by mouth every 30 (thirty) days.    levothyroxine (SYNTHROID, LEVOTHROID) 50 MCG tablet Take 50 mcg by mouth daily before breakfast.    MYRBETRIQ 50 MG TB24 tablet Take 50 mg by mouth in the morning. 01/06/2021: The patient is receiving samples of this med at the present time   NUPLAZID  34 MG CAPS Take 1 capsule by mouth daily.    sertraline (ZOLOFT) 100 MG tablet TAKE 1 TABLET BY MOUTH EVERY DAY    No facility-administered encounter medications on file as of 02/12/2021.    Patient Active Problem List    Diagnosis Date Noted   Vitamin D deficiency 07/10/2019   Vitamin B12 deficiency 07/10/2019   Hypothyroidism    Frequent falls    Osteoporosis    Closed fracture of right distal radius 12/25/2018   Parkinson disease (HCC) 10/20/2011    Conditions to be addressed/monitored: Parkinson Disease.  Level of Care Concerns, ADL/IADL Limitations, Mental Health Concerns, Limited Access to Caregiver, Cognitive Deficits, Memory Deficits, and Lacks Knowledge of [employer].  Care Plan : LCSW Plan of Care  Updates made by Karolee Stamps, LCSW since 02/12/2021 12:00 AM     Problem: Find Help in My Community.   Priority: High     Goal: Find Help in My Community.   Start Date: 02/12/2021  Expected End Date: 04/14/2021  This Visit's Progress: On track  Priority: High  Note:   Current Barriers:   Patient with Parkinson Disease, Osteoporosis, Frequent Falls and Caregiver Stress needs Support, Education, Referrals, Resources and Care Coordination to resolve unmet personal care needs in the home. Patient is unable to self-administer medications as prescribed, or consistently perform ADL's/IADL's independently. Clinical Goals:  Patient will have In-Home Aide Services in place, either through [employer], Child psychotherapist II with the [employer] of Health and [employer], or through a private agency of choice. Patient and husband will work with LCSW and Juventino Slovak, Child psychotherapist II with the [employer] of [facility] and [employer], to coordinate care for [employer]. Patient and husband will receive, review and consider arranging in-home care services through a private agency of choice, from the list provided.   Patient will demonstrate improved health management independence as evidenced by having In-Home Aide Services in place. Clinical Interventions: Patient and husband interviewed and appropriate  assessments performed. Collaboration with Primary Care Physician, Dr. Chaya Jan regarding development and update of comprehensive plan of care as evidenced by provider attestation and co-signature. Inter-disciplinary care team collaboration (see longitudinal plan of care). Interventions performed:  Problem Solving/Task Centered, Quality of Sleep Assessed and Sleep Hygiene Techniques Promoted, Caregiver Stress Acknowledged and Consideration of In-Home Aide Services Encouraged. Referral placed to Juventino Slovak, Social Worker II with the [facility] Department of Health and [employer] In-Home Aide Program. Discussed plans with patient and husband for ongoing care management follow-up and provided direct contact information for care management team. Assessed needs, level of care concerns, basic eligibility and provided education on In-Home [employer] application process. Identified resources and durable medical equipment needed in the home to improve safety and promote independence. Mailed list of In-Home Care and Respite Agencies, Home Health Care Agencies and Private Agency Sitters, along with Caregiver Support Group Resources and Schedules to patient and husband. Patient Goals/Self-Care Activities:  Follow-up with Juventino Slovak (# [phone] or # [phone]), Social Worker II with the [facility] Department of Health and [employer] In-Home Aide Program, to check the status of your application. Work with [employer] on a weekly/bi-weekly basis until approved for [employer], or until in-home care services are in place through a private agency of your choice. Review list of In-Home Care and Respite Agencies, Home Health Care Agencies and Private Agency Sitters, and begin thinking  about agencies of interest to pursue, in the event that the [employer] does not work out. Review list of Caregiver Support Groups and consider  self-enrollment. Contact LCSW directly (# [phone]) if you have questions, need assistance, or if additional social work needs are identified between now and our next scheduled telephone outreach call. Follow-Up Plan:  LCSW will follow-up with patient's husband on 02/19/2021 at 11:00am         Follow-Up Plan:  02/19/2021 at 11:00am      Danford Bad LCSW Licensed Clinical Social Worker [facility] [phone]

## 2021-02-12 NOTE — Progress Notes (Signed)
Kelly Howard [id number] 1951/01/19 70 y.o.  Subjective:   Patient ID:  Kelly Howard is a 70 y.o. (DOB Dec 26, 1950) female.  Chief Complaint: No chief complaint on file.   HPI Kelly Howard presents to the office today for follow-up of GAD, MDD, panic attacks, and insomnia.  Accompanied by husband.  Describes mood today as "ok". Tearful at times. Pleasant. Mood symptoms - denies depression and irritability. Reports decreased anxiety. Stating "I still feel anxious". Continues to take the Clonazepam 0.5mg  twice daily and the Zoloft 100mg  daily. Referred to a [facility] neurologist - [facility] for evaluation of Parkinson's. 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 Reporting some relief in symptoms. Stating things are "not as scary - things are still there, but not as many". Has been cleared through Urology and denies any recent urinary issues. Using walker more often. Getting settled in their new home - more conducive for current lifestyle needs. Daughter living with them and helping out. Improved interest and motivation. Taking medications as prescribed. Diagnosed with Parkinson's in 2010.  Energy levels improved. Active, has a regular exercise routine. Enjoys some usual interests and activities. Married. Lives with husband of 46 years. Has 2 daughters - 3 grandchildren. Mother is a 50 years old - plans to visit soon. Spending time with family. Appetite adequate. Weight stable - 131 pounds.  Sleeps better some nights than others. Averages 5 hours of solid sleep over the past week Focus and concentration difficulties. Reading some. Wanting to do a family project with pictures. Completing tasks. Managing aspects of household. Denies SI or HI.  Denies AH. Positive for VH - seeing 2 of her husband - animals and things. Working with 2 therapists - one for Parkinson's and one for daughter's issues.   Previous medication trials:  Clonazepam, Zoloft, Paxil, Celexa   PHQ2-9    Flowsheet Row Chronic Care Management from 08/27/2020 in [facility] Office Visit from 08/05/2020 in [facility] [place] from 05/20/2020 in [facility] [place] from 12/31/2019 in [facility] [place] from 04/11/2019 in [facility] [place] Total Score 4 4 1  0 0  PHQ-9 Total Score 7 8 -- 0 3      Flowsheet Row ED from 01/06/2021 in [facility]  C-SSRS RISK CATEGORY No Risk        Review of Systems:  Review of Systems  Musculoskeletal:  Negative for gait problem.  Neurological:  Negative for tremors.  Psychiatric/Behavioral:         Please refer to HPI   Medications: I have reviewed the patient's current medications.  Current Outpatient Medications  Medication Sig Dispense Refill   acetaminophen (TYLENOL) 325 MG tablet Take 325-650 mg by mouth every 6 (six) hours as needed for mild pain.     AMBULATORY NON FORMULARY MEDICATION U step walker  Dx: G20 (Patient taking differently: See admin instructions. U step walker- Dx: G20) 1 Device 0   AMBULATORY NON FORMULARY MEDICATION Abdominal compression binder Dx: G20 1 Device 0   carbidopa-levodopa (SINEMET CR) 50-200 MG tablet TAKE 1 TABLET BY MOUTH EVERYDAY AT BEDTIME (Patient taking differently: Take 1 tablet by mouth at bedtime.) 90 tablet 1   carbidopa-levodopa (SINEMET IR) 25-100 MG tablet TAKE 1.5 TABLETS BY MOUTH 5 (FIVE) TIMES DAILY. 6am/9am/noon/3pm/5pm (Patient taking differently: Take 1.5 tablets by mouth See admin instructions. Take 1.5 tablets by mouth  at 6 AM, 9 AM, 12 NOON, 3 PM, and 6 PM) 675 tablet 1   clonazePAM (KLONOPIN) 0.5 MG tablet Take 1 tablet (0.5 mg total) by mouth 3 (three) times daily as needed for anxiety. (Patient taking differently: Take 0.25 mg by mouth See admin instructions. Take 0.25 mg by mouth four to five times a  day as needed for anxiousness) 90 tablet 2   ibandronate (BONIVA) 150 MG tablet Take 150 mg by mouth every 30 (thirty) days.     levothyroxine (SYNTHROID, LEVOTHROID) 50 MCG tablet Take 50 mcg by mouth daily before breakfast.     MYRBETRIQ 50 MG TB24 tablet Take 50 mg by mouth in the morning.     NUPLAZID 34 MG CAPS Take 1 capsule by mouth daily.     sertraline (ZOLOFT) 100 MG tablet TAKE 1 TABLET BY MOUTH EVERY DAY 90 tablet 0   No current facility-administered medications for this visit.    Medication Side Effects: None  Allergies:  Allergies  Allergen Reactions   Codeine Other (See Comments)    GI Upset   Escitalopram Swelling and Other (See Comments)    Feet swell   Propoxyphene Other (See Comments)    Hallucinations    Past Medical History:  Diagnosis Date   Frequent falls    Hypothyroidism    Osteoporosis    Parkinson's disease (HCC)     Past Medical History, Surgical history, Social history, and Family history were reviewed and updated as appropriate.   Please see review of systems for further details on the patient's review from today.   Objective:   Physical Exam:  LMP  (LMP Unknown) Comment: gyn  Physical Exam Constitutional:      General: She is not in acute distress. Musculoskeletal:        General: No deformity.  Neurological:     Mental Status: She is alert and oriented to person, place, and time.     Coordination: Coordination normal.  Psychiatric:        Attention and Perception: Attention and perception normal. She does not perceive auditory or visual hallucinations.        Mood and Affect: Mood normal. Mood is not anxious or depressed. Affect is not labile, blunt, angry or inappropriate.        Speech: Speech normal.        Behavior: Behavior normal.        Thought Content: Thought content normal. Thought content is not paranoid or delusional. Thought content does not include homicidal or suicidal ideation. Thought content does not include  homicidal or suicidal plan.        Cognition and Memory: Cognition and memory normal.        Judgment: Judgment normal.     Comments: Insight intact    Lab Review:     Component Value Date/Time   NA 141 01/06/2021 1650   K 3.7 01/06/2021 1650   CL 104 01/06/2021 1650   CO2 26 01/06/2021 1650   GLUCOSE 112 (H) 01/06/2021 1650   BUN 19 01/06/2021 1650   CREATININE 0.61 01/06/2021 1650   CALCIUM 9.0 01/06/2021 1650   PROT 7.4 01/06/2021 1650   ALBUMIN 4.7 01/06/2021 1650   AST 17 01/06/2021 1650   ALT <5 01/06/2021 1650   ALKPHOS 54 01/06/2021 1650   BILITOT 0.5 01/06/2021 1650   GFRNONAA >60 01/06/2021 1650       Component Value Date/Time   WBC 4.6 01/06/2021 1650   RBC 3.77 (L)  01/06/2021 1650   HGB 11.1 (L) 01/06/2021 1650   HCT 35.0 (L) 01/06/2021 1650   PLT 272 01/06/2021 1650   MCV 92.8 01/06/2021 1650   MCH 29.4 01/06/2021 1650   MCHC 31.7 01/06/2021 1650   RDW 13.1 01/06/2021 1650   LYMPHSABS 0.7 01/06/2021 1650   MONOABS 0.5 01/06/2021 1650   EOSABS 0.0 01/06/2021 1650   BASOSABS 0.0 01/06/2021 1650    No results found for: POCLITH, LITHIUM   No results found for: PHENYTOIN, PHENOBARB, VALPROATE, CBMZ   .res Assessment: Plan:     Plan:  PDMP reviewed  Zoloft 100mg  daily Clonazepam 0.5mg  BID  Also taking Nueplazid for Parkinson's psychosis  RTC 4 weeks  Following up with PCP today.   Patient advised to contact office with any questions, adverse effects, or acute worsening in signs and symptoms.  Discussed potential benefits, risk, and side effects of benzodiazepines to include potential risk of tolerance and dependence, as well as possible drowsiness.  Advised patient not to drive if experiencing drowsiness and to take lowest possible effective dose to minimize risk of dependence and tolerance.   Diagnoses and all orders for this visit:  Insomnia, unspecified type  Panic attacks  Generalized anxiety disorder  Psychosis, unspecified  psychosis type (HCC)  Major depressive disorder, recurrent episode, moderate (HCC)    Please see After Visit Summary for patient specific instructions.  Future Appointments  Date Time Provider Department Center  02/23/2021 11:30 AM [facility] CARE [facility]  03/16/2021  3:00 PM 03/18/2021, PhD CP-CP None  04/09/2021  1:20 PM Analiah Drum, 13/03/2021, NP CP-CP None  06/10/2021  2:30 PM Tat, 08/08/2021, DO [facility] None    No orders of the defined types were placed in this encounter.   -------------------------------

## 2021-02-12 NOTE — Patient Instructions (Signed)
Visit Information   PATIENT GOALS:   Goals Addressed             This Visit's Progress    Find Help in My Community.   On track    Timeframe:  Short-Term Goal Priority:  High Start Date:   02/12/2021                      Expected End Date:  04/14/2021                  Follow-Up Date:  02/19/2021 at 11:00am  Patient Goals/Self-Care Activities:  Follow-up with Rosealee Albee (# [phone] or # [phone]), Social Worker II with the [place] [facility], to check the status of your application. Work with [employer] on a weekly/bi-weekly basis until approved for [employer], or until in-home care services are in place through a private agency of your choice. Review list of In-Home Care and Respite Agencies, [place], and begin thinking about agencies of interest to pursue, in the event that the [employer] does not work out. Review list of Caregiver Support Groups and consider self-enrollment. Contact LCSW directly (# [phone]) if you have questions, need assistance, or if additional social work needs are identified between now and our next scheduled telephone outreach call.          Consent to CCM Services: Ms. Hanaway was given information about Chronic Care Management services including:  CCM service includes personalized support from designated clinical staff supervised by her physician, including individualized plan of care and coordination with other care providers 24/7 contact phone numbers for assistance for urgent and routine care needs. Service will only be billed when office clinical staff spend 20 minutes or more in a month to coordinate care. Only one practitioner may furnish and bill the service in a calendar month. The patient may stop CCM services at any time (effective at the end of the month) by phone call to the office staff. The patient  will be responsible for cost sharing (co-pay) of up to 20% of the service fee (after annual deductible is met).  Patient agreed to services and verbal consent obtained.   Patient verbalizes understanding of instructions provided today and agrees to view in [place].   Telephone follow up appointment with care management team member scheduled for:  02/19/2021 at 11:00am  Nat Christen LCSW Licensed Clinical Social Worker [facility] [place] [phone]   CLINICAL CARE PLAN: Patient Care Plan: RNCM:Osteoporosis (Adult)     Problem Identified: Risk of harm or Injury related to hx of frequent falls and osteoporosis   Priority: High     Long-Range Goal: Harm or Injury Prevented   Start Date: 08/27/2020  Expected End Date: 05/30/2021  This Visit's Progress: On track  Recent Progress: On track  Priority: High  Note:   Current Barriers:  Knowledge Deficits related to fall precautions in patient with osteoporosis and Parkinson's disease pt has had over 4 falls in last year,  Decreased adherence to prescribed treatment for fall prevention Does not adhere to provider recommendations re: use of walker Does not adhere to prescribed psychotropic medication regimen Unable to perform IADLs independently-family assists as needed Knowledge Deficits related to fall prevention and safety Chronic Disease Management support and education needs related to fall prevention and safety, osteoporosis Spoke with husband Doratha Mcswain.  States that pt has had one fall  in the last month.  States that her new home is better set up for her and she is using her walker more.  States that they are not leaving her alone and their daughter is staying with them to help him Clinical Goal(s):  patient will demonstrate improved adherence to prescribed treatment plan for decreasing falls as evidenced by patient reporting and review of EMR patient will verbalize using fall risk reduction strategies discussed patient will  not experience additional falls patient will verbalize understanding of plan for fall prevention and safety patient will meet with RN Care Manager to address educational needs for fall prevention and osteoporosis patient will attend all scheduled medical appointments: no scheduled primary care visit, neurology 01/26/21 patient will demonstrate improved adherence to prescribed treatment plan for fall prevention as evidenced by reported fewer falls Interventions:  Collaboration with [name], Rayford Halsted, MD regarding development and update of comprehensive plan of care as evidenced by provider attestation and co-signature Inter-disciplinary care team collaboration (see longitudinal plan of care) Reviewed medications and discussed potential side effects of medications such as dizziness and frequent urination Assessed for falls since last encounter. Reinforced fall risk prevention secondary to previously provided education. Evaluation of current treatment plan related to fall safety and patient's adherence to plan as established by provider. Reinforced to use walker at all times Reinforced education to patient re: fall prevention, safety and osteoporosis  Reviewed scheduled/upcoming provider appointments including: Dr. Bjorn Loser neurology 01/26/21  no current primary care provider appt scheduled Discussed plans with patient for ongoing care management follow up and provided patient with direct contact information for care management team Reinforced to try to time her activities when she is feeling stronger and to use her seated walker so she can rest if needed Reinforced to drink adequate amounts of fluids and to get up slowly if she feels dizzy Self-Care Deficits:  Does not adhere to provider recommendations re: use of walker Unable to perform IADLs independently Patient Goals:  - Utilize walker (assistive device) appropriately with all ambulation - De-clutter walkways - Change positions  slowly - Wear secure fitting shoes at all times with ambulation - Utilize home lighting for dim lit areas - Demonstrate self and pet awareness at all times - always use handrails on the stairs - always wear shoes or slippers with non-slip sole - get at least 10 minutes of activity every day - install bathroom grab bars - keep a flashlight by the bed - keep cell phone with me always - make an emergency alert plan in case I fall - pick up clutter from the floors - remove, or use a non-slip pad, with my throw rugs - use a cane or walker at all times - use a nightlight in the bathroom Follow Up Plan: Telephone follow up appointment with care management team member scheduled for: 02/23/21 at 11:30 AM The patient has been provided with contact information for the care management team and has been advised to call with any health related questions or concerns.      Patient Care Plan: RNCM: Parkinson's disease     Problem Identified: Lack of coping skills for Parkinson's disease   Priority: Medium     Long-Range Goal: Improved coping skills for Parkinson's disease   Start Date: 08/27/2020  Expected End Date: 05/30/2021  Recent Progress: On track  Priority: Medium  Note:   Current Barriers:  Ineffective Self Health Maintenance for Parkinson's disease  Does not adhere to provider recommendations re:  Unable to  perform IADLs independently Spoke with husband Sydney Hasten.  States that pt's hallucinations became worse after they moved into their new home.  States that he took her to the ED for evaluation but did not get any help.  States pt is going to see a new neurologist at [facility] to see if their concerns will be addressed.  States that pt did go to the urologist and she is now on a medication to help with her bladder spasms.  States he can not tell if it is helping yet.  States he is taking pt to the [facility] several times a week to keep up with her exercise.  Clinical Goal(s):  Collaboration  with [name], Rayford Halsted, MD regarding development and update of comprehensive plan of care as evidenced by provider attestation and co-signature Inter-disciplinary care team collaboration (see longitudinal plan of care) patient will work with care management team to address care coordination and chronic disease management needs related to Disease Management Educational Needs   Interventions:  Evaluation of current treatment plan related to  Parkinson's disease , ADL IADL limitations and Mental Health Concerns  self-management and patient's adherence to plan as established by provider. Collaboration with [name], Rayford Halsted, MD regarding development and update of comprehensive plan of care as evidenced by provider attestation       and co-signature Inter-disciplinary care team collaboration (see longitudinal plan of care) Discussed plans with patient for ongoing care management follow up and provided patient with direct contact information for care management team Reinforced to continue to follow up with new neurology and counselor  Reinforced to continue going to gym for Parkinson's classes Reinforced on s/sx of UTI to call provider and encouraged to empty bladder frequently and to drink adequate amounts of fluids Reviewed to notify MD if she has swallowing issues or cough Reviewed need respite care and discussed caregiver stress. Agreeable to referral to CCM LCSW for caregvier stress and respite care Self Care Activities:  Patient verbalizes understanding of plan to improve coping skills and self management of Parkinson's disease Self administers medications as prescribed Attends all scheduled provider appointments Calls provider office for new concerns or questions Patient Goals: - repeat what I heard to make sure I understand - bring a list of my medicines to the visit - speak up when I don't understand - keep a calendar with appointment dates - make a list of family or  friends that I can call - learn and use visualization or guided imagery - practice relaxation or meditation daily - start or continue a personal journal - talk about feelings with a friend, family or spiritual advisor -use walker at all times Call to schedule appt with provider  Follow Up Plan: Telephone follow up appointment with care management team member scheduled for: 02/23/21 at 11:30 AM The patient has been provided with contact information for the care management team and has been advised to call with any health related questions or concerns.      Patient Care Plan: LCSW Plan of Care     Problem Identified: Find Help in My Community.   Priority: High     Goal: Find Help in My Community.   Start Date: 02/12/2021  Expected End Date: 04/14/2021  This Visit's Progress: On track  Priority: High  Note:   Current Barriers:   Patient with Parkinson Disease, Osteoporosis, Frequent Falls and Caregiver Stress needs Support, Education, Referrals, Resources and Care Coordination to resolve unmet personal care needs in the home.  Patient is unable to self-administer medications as prescribed, or consistently perform ADL's/IADL's independently. Clinical Goals:  Patient will have [place] in place, either through [facility], Education officer, museum II with the [place] [facility], or through a private agency of choice. Patient and husband will work with LCSW and Rosealee Albee, Education officer, museum II with the [place] [facility], to coordinate care for [employer]. Patient and husband will receive, review and consider arranging in-home care services through a private agency of choice, from the list provided.   Patient will demonstrate improved health management independence as evidenced by having [place] in place. Clinical Interventions: Patient and husband  interviewed and appropriate assessments performed. Collaboration with Primary Care Physician, Dr. Lelon Frohlich regarding development and update of comprehensive plan of care as evidenced by provider attestation and co-signature. Inter-disciplinary care team collaboration (see longitudinal plan of care). Interventions performed:  Problem Solving/Task Centered, Quality of Sleep Assessed and Sleep Hygiene Techniques Promoted, Caregiver Stress Acknowledged and Consideration of In-Home Aide Services Encouraged. Referral placed to Rosealee Albee, Social Worker II with the [place] [facility]. Discussed plans with patient and husband for ongoing care management follow-up and provided direct contact information for care management team. Assessed needs, level of care concerns, basic eligibility and provided education on [place] Program application process. Identified resources and durable medical equipment needed in the home to improve safety and promote independence. Mailed list of In-Home Care and Respite Agencies, [place], along with Caregiver Support Group Resources and Schedules to patient and husband. Patient Goals/Self-Care Activities:  Follow-up with Rosealee Albee (# [phone] or # [phone]), Social Worker II with the [place] [facility], to check the status of your application. Work with [employer] on a weekly/bi-weekly basis until approved for [employer], or until in-home care services are in place through a private agency of your choice. Review list of In-Home Care and Respite Agencies, [place], and begin thinking about agencies of interest to pursue, in the event that the [employer] does not work out. Review list of Caregiver Support Groups and  consider self-enrollment. Contact LCSW directly (# [phone]) if you have questions, need assistance, or if additional social work needs are identified between now and our next scheduled telephone outreach call. Follow-Up Plan:  LCSW will follow-up with patient's husband on 02/19/2021 at 11:00am

## 2021-02-15 DIAGNOSIS — F4323 Adjustment disorder with mixed anxiety and depressed mood: Secondary | ICD-10-CM | POA: Diagnosis not present

## 2021-02-19 ENCOUNTER — Ambulatory Visit: Payer: Medicare PPO | Admitting: *Deleted

## 2021-02-19 DIAGNOSIS — G20 Parkinson's disease: Secondary | ICD-10-CM

## 2021-02-19 DIAGNOSIS — R296 Repeated falls: Secondary | ICD-10-CM

## 2021-02-19 DIAGNOSIS — F411 Generalized anxiety disorder: Secondary | ICD-10-CM

## 2021-02-19 DIAGNOSIS — F419 Anxiety disorder, unspecified: Secondary | ICD-10-CM

## 2021-02-19 DIAGNOSIS — M810 Age-related osteoporosis without current pathological fracture: Secondary | ICD-10-CM

## 2021-02-19 NOTE — Chronic Care Management (AMB) (Signed)
Chronic Care Management    Clinical Social Work Note  02/19/2021 Name: Kelly Howard MRN: [id number] DOB: 30-Apr-1951  Kelly Howard is a 70 y.o. year old female who is a primary care patient of Philip Aspen, Limmie Patricia, MD. The CCM team was consulted to assist the patient with chronic disease management and/or care coordination needs related to: [employer], Level of Care Concerns, and Caregiver Stress.   Engaged with patient's husband by telephone for follow-up visit in response to provider referral for social work chronic care management and care coordination services.   Consent to Services:  The patient was given information about Chronic Care Management services, agreed to services, and gave verbal consent prior to initiation of services.  Please see initial visit note for detailed documentation.   Patient agreed to services and consent obtained.   Assessment: Review of patient past medical history, allergies, medications, and health status, including review of relevant consultants reports was performed today as part of a comprehensive evaluation and provision of chronic care management and care coordination services.     SDOH (Social Determinants of Health) assessments and interventions performed:    Advanced Directives Status: Not addressed in this encounter.  CCM Care Plan  Allergies  Allergen Reactions   Codeine Other (See Comments)    GI Upset   Escitalopram Swelling and Other (See Comments)    Feet swell   Propoxyphene Other (See Comments)    Hallucinations    Outpatient Encounter Medications as of 02/19/2021  Medication Sig Note   acetaminophen (TYLENOL) 325 MG tablet Take 325-650 mg by mouth every 6 (six) hours as needed for mild pain.    AMBULATORY NON FORMULARY MEDICATION U step walker  Dx: G20 (Patient taking differently: See admin instructions. U step walker- Dx: G20)    AMBULATORY NON FORMULARY MEDICATION Abdominal compression  binder Dx: G20    carbidopa-levodopa (SINEMET CR) 50-200 MG tablet TAKE 1 TABLET BY MOUTH EVERYDAY AT BEDTIME (Patient taking differently: Take 1 tablet by mouth at bedtime.)    carbidopa-levodopa (SINEMET IR) 25-100 MG tablet TAKE 1.5 TABLETS BY MOUTH 5 (FIVE) TIMES DAILY. 6am/9am/noon/3pm/5pm (Patient taking differently: Take 1.5 tablets by mouth See admin instructions. Take 1.5 tablets by mouth at 6 AM, 9 AM, 12 NOON, 3 PM, and 6 PM)    clonazePAM (KLONOPIN) 0.5 MG tablet Take 1 tablet (0.5 mg total) by mouth 3 (three) times daily as needed for anxiety. (Patient taking differently: Take 0.25 mg by mouth See admin instructions. Take 0.25 mg by mouth four to five times a day as needed for anxiousness)    ibandronate (BONIVA) 150 MG tablet Take 150 mg by mouth every 30 (thirty) days.    levothyroxine (SYNTHROID, LEVOTHROID) 50 MCG tablet Take 50 mcg by mouth daily before breakfast.    MYRBETRIQ 50 MG TB24 tablet Take 50 mg by mouth in the morning. 01/06/2021: The patient is receiving samples of this med at the present time   NUPLAZID 34 MG CAPS Take 1 capsule by mouth daily.    sertraline (ZOLOFT) 100 MG tablet TAKE 1 TABLET BY MOUTH EVERY DAY    No facility-administered encounter medications on file as of 02/19/2021.    Patient Active Problem List   Diagnosis Date Noted   Vitamin D deficiency 07/10/2019   Vitamin B12 deficiency 07/10/2019   Hypothyroidism    Frequent falls    Osteoporosis    Closed fracture of right distal radius 12/25/2018   Parkinson disease (HCC) 10/20/2011  Conditions to be addressed/monitored: Parkinson Disease.  Level of Care Concerns, ADL/IADL Limitations, Limited Access to Caregiver, Cognitive Deficits, Memory Deficits, and Lacks Knowledge of [employer].  Care Plan : LCSW Plan of Care  Updates made by Karolee Stamps, LCSW since 02/19/2021 12:00 AM     Problem: Find Help in My Community.   Priority: High     Goal: Find Help in My Community.    Start Date: 02/12/2021  Expected End Date: 04/14/2021  This Visit's Progress: On track  Recent Progress: On track  Priority: High  Note:   Current Barriers:   Patient with Parkinson Disease, Osteoporosis, Frequent Falls and Caregiver Stress needs Support, Education, Referrals, Resources and Care Coordination to resolve unmet personal care needs in the home. Clinical Goals:  Patient will have In-Home Aide Services in place, either through [employer], Child psychotherapist II with the [employer] of [facility] and [employer], or through a private agency of choice. Clinical Interventions: Caregiver Stress Acknowledged and Consideration of In-Home Aide Services Encouraged. Patient Goals/Self-Care Activities:  Periodically follow-up with Juventino Slovak (# [phone] or # [phone]), Social Worker II with the [facility] Department of Health and [employer] Aide Program, to check the status of your application for services. Continue to work with [employer] on a weekly/bi-weekly basis, until approved for [employer], and continue to outreach to home health care agencies and private agency sitters of choice, from the list provided.   Contact LCSW directly (# [phone]) if you have questions, need assistance, or if additional social work needs are identified between now and our next scheduled telephone outreach call. Follow-Up Plan:  LCSW will follow-up with patient's husband on 02/25/2021 at 2:15pm      Danford Bad LCSW Licensed Clinical Social Worker [facility] [phone]

## 2021-02-19 NOTE — Patient Instructions (Signed)
Visit Information  PATIENT GOALS:  Goals Addressed             This Visit's Progress    Find Help in My Community.   On track    Timeframe:  Short-Term Goal Priority:  High Start Date:   02/12/2021                      Expected End Date:  04/14/2021                  Follow-Up Date:  02/25/2021 at 2:15pm  Patient Goals/Self-Care Activities:  Periodically follow-up with Juventino Slovak (# [phone] or # [phone]), Social Worker II with the [facility] Department of Health and [employer] In-Home Aide Program, to check the status of your application for services. Continue to work with [employer] on a weekly/bi-weekly basis, until approved for [employer], and continue to outreach to home health care agencies and private agency sitters of choice, from the list provided.   Contact LCSW directly (# [phone]) if you have questions, need assistance, or if additional social work needs are identified between now and our next scheduled telephone outreach call.          Patient verbalizes understanding of instructions provided today and agrees to view in MyChart.   Telephone follow up appointment with care management team member scheduled for:  02/25/2021 at 2:15pm  Danford Bad LCSW Licensed Clinical Social Worker [facility] [phone]

## 2021-02-23 ENCOUNTER — Ambulatory Visit: Payer: Medicare PPO

## 2021-02-23 DIAGNOSIS — R296 Repeated falls: Secondary | ICD-10-CM

## 2021-02-23 DIAGNOSIS — G20 Parkinson's disease: Secondary | ICD-10-CM

## 2021-02-23 DIAGNOSIS — G20A1 Parkinson's disease without dyskinesia, without mention of fluctuations: Secondary | ICD-10-CM

## 2021-02-23 DIAGNOSIS — M810 Age-related osteoporosis without current pathological fracture: Secondary | ICD-10-CM

## 2021-02-23 NOTE — Chronic Care Management (AMB) (Signed)
Chronic Care Management   CCM RN Visit Note  02/23/2021 Name: Kelly Howard MRN: [id number] DOB: [birth year]  Subjective: Kelly Howard is a 70 y.o. year old female who is a primary care patient of Philip Aspen, Limmie Patricia, MD. The care management team was consulted for assistance with disease management and care coordination needs.    Engaged with patient by telephone for follow up visit in response to provider referral for case management and/or care coordination services.   Consent to Services:  The patient was given information about Chronic Care Management services, agreed to services, and gave verbal consent prior to initiation of services.  Please see initial visit note for detailed documentation.   Patient agreed to services and verbal consent obtained.   Assessment: Review of patient past medical history, allergies, medications, health status, including review of consultants reports, laboratory and other test data, was performed as part of comprehensive evaluation and provision of chronic care management services.   SDOH (Social Determinants of Health) assessments and interventions performed:    CCM Care Plan  Allergies  Allergen Reactions   Codeine Other (See Comments)    GI Upset   Escitalopram Swelling and Other (See Comments)    Feet swell   Propoxyphene Other (See Comments)    Hallucinations    Outpatient Encounter Medications as of 02/23/2021  Medication Sig Note   acetaminophen (TYLENOL) 325 MG tablet Take 325-650 mg by mouth every 6 (six) hours as needed for mild pain.    AMBULATORY NON FORMULARY MEDICATION U step walker  Dx: G20 (Patient taking differently: See admin instructions. U step walker- Dx: G20)    AMBULATORY NON FORMULARY MEDICATION Abdominal compression binder Dx: G20    carbidopa-levodopa (SINEMET CR) 50-200 MG tablet TAKE 1 TABLET BY MOUTH EVERYDAY AT BEDTIME (Patient taking differently: Take 1 tablet by mouth at bedtime.)     carbidopa-levodopa (SINEMET IR) 25-100 MG tablet TAKE 1.5 TABLETS BY MOUTH 5 (FIVE) TIMES DAILY. 6am/9am/noon/3pm/5pm (Patient taking differently: Take 1.5 tablets by mouth See admin instructions. Take 1.5 tablets by mouth at 6 AM, 9 AM, 12 NOON, 3 PM, and 6 PM)    clonazePAM (KLONOPIN) 0.5 MG tablet Take 1 tablet (0.5 mg total) by mouth 3 (three) times daily as needed for anxiety. (Patient taking differently: Take 0.25 mg by mouth See admin instructions. Take 0.25 mg by mouth four to five times a day as needed for anxiousness)    ibandronate (BONIVA) 150 MG tablet Take 150 mg by mouth every 30 (thirty) days.    levothyroxine (SYNTHROID, LEVOTHROID) 50 MCG tablet Take 50 mcg by mouth daily before breakfast.    MYRBETRIQ 50 MG TB24 tablet Take 50 mg by mouth in the morning. (Patient not taking: Reported on 02/23/2021) 01/06/2021: The patient is receiving samples of this med at the present time   NUPLAZID 34 MG CAPS Take 1 capsule by mouth daily.    sertraline (ZOLOFT) 100 MG tablet TAKE 1 TABLET BY MOUTH EVERY DAY    No facility-administered encounter medications on file as of 02/23/2021.    Patient Active Problem List   Diagnosis Date Noted   Vitamin D deficiency 07/10/2019   Vitamin B12 deficiency 07/10/2019   Hypothyroidism    Frequent falls    Osteoporosis    Closed fracture of right distal radius 12/25/2018   Parkinson disease (HCC) 10/20/2011    Conditions to be addressed/monitored: Parkinson"s disease, frequent falls, osteoporosis  Care Plan : RNCM:Osteoporosis (Adult)  Updates made by [employer],  Barbee Cough, RN since 02/23/2021 12:00 AM     Problem: Risk of harm or Injury related to hx of frequent falls and osteoporosis   Priority: High     Long-Range Goal: Harm or Injury Prevented   Start Date: 08/27/2020  Expected End Date: 05/30/2021  This Visit's Progress: On track  Recent Progress: On track  Priority: High  Note:   Current Barriers:  Knowledge Deficits related to fall  precautions in patient with osteoporosis and Parkinson's disease pt has had over 4 falls in last year,  Decreased adherence to prescribed treatment for fall prevention Does not adhere to provider recommendations re: use of walker Does not adhere to prescribed psychotropic medication regimen Unable to perform IADLs independently-family assists as needed Knowledge Deficits related to fall prevention and safety Chronic Disease Management support and education needs related to fall prevention and safety, osteoporosis Spoke with husband Shalla Bulluck.  States that pt has not had any falls this last month.  States  she is using her walker more.  States that they are not leaving her alone and their daughter is staying with them to help him Clinical Goal(s):  patient will demonstrate improved adherence to prescribed treatment plan for decreasing falls as evidenced by patient reporting and review of EMR patient will verbalize using fall risk reduction strategies discussed patient will not experience additional falls patient will verbalize understanding of plan for fall prevention and safety patient will meet with RN Care Manager to address educational needs for fall prevention and osteoporosis patient will attend all scheduled medical appointments: no scheduled primary care visit, neurology 03/30/21 patient will demonstrate improved adherence to prescribed treatment plan for fall prevention as evidenced by reported fewer falls Interventions:  Collaboration with Philip Aspen, Limmie Patricia, MD regarding development and update of comprehensive plan of care as evidenced by provider attestation and co-signature Inter-disciplinary care team collaboration (see longitudinal plan of care) Reviewed medications and discussed potential side effects of medications such as dizziness and frequent urination Assessed for falls since last encounter. Reinforced fall risk prevention secondary to previously provided  education. Evaluation of current treatment plan related to fall safety and patient's adherence to plan as established by provider. Reinforced to use walker at all times Reinforced education to patient re: fall prevention, safety and osteoporosis  Reviewed scheduled/upcoming provider appointments including: Dr. Daphane Shepherd neurology 03/30/21  no current primary care provider appt scheduled Discussed plans with patient for ongoing care management follow up and provided patient with direct contact information for care management team Reinforced to try to time her activities when she is feeling stronger and to use her seated walker so she can rest if needed Reinforced to drink adequate amounts of fluids and to get up slowly if she feels dizzy Self-Care Deficits:  Does not adhere to provider recommendations re: use of walker Unable to perform IADLs independently Patient Goals:  - Utilize walker (assistive device) appropriately with all ambulation - De-clutter walkways - Change positions slowly - Wear secure fitting shoes at all times with ambulation - Utilize home lighting for dim lit areas - Demonstrate self and pet awareness at all times - always use handrails on the stairs - always wear shoes or slippers with non-slip sole - get at least 10 minutes of activity every day - install bathroom grab bars - keep a flashlight by the bed - keep cell phone with me always - make an emergency alert plan in case I fall - pick up clutter from the floors - remove,  or use a non-slip pad, with my throw rugs - use a cane or walker at all times - use a nightlight in the bathroom Follow Up Plan: Telephone follow up appointment with care management team member scheduled for: 03/25/21 at 11:30 AM The patient has been provided with contact information for the care management team and has been advised to call with any health related questions or concerns.      Care Plan : RNCM: Parkinson's disease  Updates made by  Yetta Glassman, RN since 02/23/2021 12:00 AM     Problem: Lack of coping skills for Parkinson's disease   Priority: Medium     Long-Range Goal: Improved coping skills for Parkinson's disease   Start Date: 08/27/2020  Expected End Date: 05/30/2021  This Visit's Progress: On track  Recent Progress: On track  Priority: Medium  Note:   Current Barriers:  Ineffective Self Health Maintenance for Parkinson's disease  Does not adhere to provider recommendations re:  Unable to perform IADLs independently Spoke with husband Alisan Dokes.  States that pt's hallucinations became worse after they moved into their new home.  States that her new neurologist at [facility] was very helpful and she has started a new medication that is helping with the hallucinations.  States she is sleeping better and has less confusion.   States that pt did go to the urologist and she is now on a new medication to help with her bladder spasms.  States the new medication is helping with her frequent urination.  States he is taking pt to the [facility] several times a week to keep up with her exercise.  Clinical Goal(s):  Collaboration with Philip Aspen, Limmie Patricia, MD regarding development and update of comprehensive plan of care as evidenced by provider attestation and co-signature Inter-disciplinary care team collaboration (see longitudinal plan of care) patient will work with care management team to address care coordination and chronic disease management needs related to Disease Management Educational Needs   Interventions:  Evaluation of current treatment plan related to  Parkinson's disease , ADL IADL limitations and Mental Health Concerns  self-management and patient's adherence to plan as established by provider. Collaboration with Philip Aspen, Limmie Patricia, MD regarding development and update of comprehensive plan of care as evidenced by provider attestation       and co-signature Inter-disciplinary care team  collaboration (see longitudinal plan of care) Discussed plans with patient for ongoing care management follow up and provided patient with direct contact information for care management team Reinforced to continue to follow up with new neurology and counselor  Reinforced to continue going to gym for Parkinson's classes Reinforced on s/sx of UTI to call provider and encouraged to empty bladder frequently and to drink adequate amounts of fluids Reinforced to notify MD if she has swallowing issues or cough Reinforced need respite care and discussed caregiver stress. Agreeable to referral to CCM LCSW for caregvier stress and respite care-referral completed next LCSW visit 02/25/21 Self Care Activities:  Patient verbalizes understanding of plan to improve coping skills and self management of Parkinson's disease Self administers medications as prescribed Attends all scheduled provider appointments Calls provider office for new concerns or questions Patient Goals: - repeat what I heard to make sure I understand - bring a list of my medicines to the visit - speak up when I don't understand - keep a calendar with appointment dates - make a list of family or friends that I can call - learn and use visualization or  guided imagery - practice relaxation or meditation daily - start or continue a personal journal - talk about feelings with a friend, family or spiritual advisor -use walker at all times Call to schedule appt with provider  Follow Up Plan: Telephone follow up appointment with care management team member scheduled for: 03/25/21 at 11:30 AM The patient has been provided with contact information for the care management team and has been advised to call with any health related questions or concerns.       Plan:Telephone follow up appointment with care management team member scheduled for:  03/22/21 and The patient has been provided with contact information for the care management team and has  been advised to call with any health related questions or concerns.  Dudley Major RN, Maximiano Coss, CDE Care Management Coordinator [facility] [phone], Mobile [phone]

## 2021-02-23 NOTE — Patient Instructions (Signed)
Visit Information  PATIENT GOALS:  Goals Addressed             This Visit's Progress    RNCM:Manage My Emotions   On track    Timeframe:  Long-Range Goal Priority:  Medium Start Date:      08/27/20                       Expected End Date:       04/29/21                Follow Up Date 03/25/21    - learn and use visualization or guided imagery - practice relaxation or meditation daily - start or continue a personal journal - talk about feelings with a friend, family or spiritual advisor  -continue to work with counselor on stress and anxiety   Why is this important?   When you are stressed, down or upset, your body reacts too.  For example, your blood pressure may get higher; you may have a headache or stomachache.  When your emotions get the best of you, your body's ability to fight off cold and flu gets weak.  These steps will help you manage your emotions.     Notes:      RNCM:Prevent Falls and Broken Bones-Osteoporosis   On track    Timeframe:  Long-Range Goal Priority:  High Start Date:     08/27/20                        Expected End Date:   04/29/21                    Follow Up Date 03/25/21    - always use handrails on the stairs - always wear shoes or slippers with non-slip sole - get at least 10 minutes of activity every day - install bathroom grab bars - keep a flashlight by the bed - keep cell phone with me always - make an emergency alert plan in case I fall - pick up clutter from the floors - remove, or use a non-slip pad, with my throw rugs - use a cane or walker - use a nightlight in the bathroom    Why is this important?   When you fall, there are 3 things that control if a bone breaks or not.  These are the fall itself, how hard and the direction that you fall and how fragile your bones are.  Preventing falls is very important for you because of fragile bones.     Notes:         Patient verbalizes understanding of instructions provided today  and agrees to view in MyChart.   Telephone follow up appointment with care management team member scheduled for: 03/22/21 at 11:30 AM  Dudley Major RN, Maximiano Coss, CDE Care Management Coordinator [facility] [phone], Mobile [phone]

## 2021-02-25 ENCOUNTER — Ambulatory Visit: Payer: Medicare PPO | Admitting: *Deleted

## 2021-02-25 DIAGNOSIS — G20 Parkinson's disease: Secondary | ICD-10-CM

## 2021-02-25 DIAGNOSIS — M810 Age-related osteoporosis without current pathological fracture: Secondary | ICD-10-CM

## 2021-02-25 DIAGNOSIS — F419 Anxiety disorder, unspecified: Secondary | ICD-10-CM

## 2021-02-25 DIAGNOSIS — F411 Generalized anxiety disorder: Secondary | ICD-10-CM

## 2021-02-25 DIAGNOSIS — R296 Repeated falls: Secondary | ICD-10-CM

## 2021-02-25 NOTE — Patient Instructions (Signed)
Visit Information  PATIENT GOALS:  Goals Addressed             This Visit's Progress    COMPLETED: Find Help in My Community.   On track    Timeframe:  Short-Term Goal Priority:  High Start Date:   02/12/2021                      Expected End Date:  02/25/2021                  Follow-Up Date:  No Follow-Up Required, Per Husband.  Patient Goals/Self-Care Activities:  Mr. Nyland agreed to periodically follow-up with Juventino Slovak (# [phone] or # [phone]), Social Worker II with the [facility] Department of Health and [employer] In-Home Aide Program, to check the status of your application for services. Mr. Mcweeney will contact LCSW directly (# [phone]) if he has questions, needs assistance, or if additional social work needs are identified within the near future.   Mr. Hurston was encouraged to self-enroll in a Caregiver Support Group, from the list provided.        Patient verbalizes understanding of instructions provided today and agrees to view in MyChart.   No Follow-Up Required, Per Husband.  Danford Bad LCSW Licensed Clinical Social Worker [facility] [phone]

## 2021-02-25 NOTE — Chronic Care Management (AMB) (Signed)
Chronic Care Management    Clinical Social Work Note  02/25/2021 Name: Kelly Howard MRN: [id number] DOB: 1950/08/27  Kelly Howard is a 70 y.o. year old female who is a primary care patient of Philip Aspen, Limmie Patricia, MD. The CCM team was consulted to assist the patient with chronic disease management and/or care coordination needs related to: Level of Care Concerns and Caregiver Stress.   Engaged with patient's husband by telephone for follow-up visit in response to provider referral for social work chronic care management and care coordination services.  Mr. Benn denies the need for continued social work involvement, taking down LCSW's contact information and agreeing to contact LCSW directly if he changes his mind, or if additional social work needs are identified in the near future.  Mr. Tebbetts reported that the "new medications" appear to be working well, and that he is very optimistic.    Consent to Services:  The patient was given information about Chronic Care Management services, agreed to services, and gave verbal consent prior to initiation of services.  Please see initial visit note for detailed documentation.   Patient agreed to services and consent obtained.   Assessment: Review of patient past medical history, allergies, medications, and health status, including review of relevant consultants reports was performed today as part of a comprehensive evaluation and provision of chronic care management and care coordination services.     SDOH (Social Determinants of Health) assessments and interventions performed:    Advanced Directives Status: Not addressed in this encounter.  CCM Care Plan  Allergies  Allergen Reactions   Codeine Other (See Comments)    GI Upset   Escitalopram Swelling and Other (See Comments)    Feet swell   Propoxyphene Other (See Comments)    Hallucinations    Outpatient Encounter Medications as of 02/25/2021  Medication Sig Note    acetaminophen (TYLENOL) 325 MG tablet Take 325-650 mg by mouth every 6 (six) hours as needed for mild pain.    AMBULATORY NON FORMULARY MEDICATION U step walker  Dx: G20 (Patient taking differently: See admin instructions. U step walker- Dx: G20)    AMBULATORY NON FORMULARY MEDICATION Abdominal compression binder Dx: G20    carbidopa-levodopa (SINEMET CR) 50-200 MG tablet TAKE 1 TABLET BY MOUTH EVERYDAY AT BEDTIME (Patient taking differently: Take 1 tablet by mouth at bedtime.)    carbidopa-levodopa (SINEMET IR) 25-100 MG tablet TAKE 1.5 TABLETS BY MOUTH 5 (FIVE) TIMES DAILY. 6am/9am/noon/3pm/5pm (Patient taking differently: Take 1.5 tablets by mouth See admin instructions. Take 1.5 tablets by mouth at 6 AM, 9 AM, 12 NOON, 3 PM, and 6 PM)    clonazePAM (KLONOPIN) 0.5 MG tablet Take 1 tablet (0.5 mg total) by mouth 3 (three) times daily as needed for anxiety. (Patient taking differently: Take 0.25 mg by mouth See admin instructions. Take 0.25 mg by mouth four to five times a day as needed for anxiousness)    ibandronate (BONIVA) 150 MG tablet Take 150 mg by mouth every 30 (thirty) days.    levothyroxine (SYNTHROID, LEVOTHROID) 50 MCG tablet Take 50 mcg by mouth daily before breakfast.    MYRBETRIQ 50 MG TB24 tablet Take 50 mg by mouth in the morning. (Patient not taking: Reported on 02/23/2021) 01/06/2021: The patient is receiving samples of this med at the present time   NUPLAZID 34 MG CAPS Take 1 capsule by mouth daily.    sertraline (ZOLOFT) 100 MG tablet TAKE 1 TABLET BY MOUTH EVERY DAY  No facility-administered encounter medications on file as of 02/25/2021.    Patient Active Problem List   Diagnosis Date Noted   Vitamin D deficiency 07/10/2019   Vitamin B12 deficiency 07/10/2019   Hypothyroidism    Frequent falls    Osteoporosis    Closed fracture of right distal radius 12/25/2018   Parkinson disease (HCC) 10/20/2011    Conditions to be addressed/monitored: Dementia and Parkinson  Disease.  ADL/IADL Limitations, Limited Access to Caregiver, Cognitive Deficits, and Memory Deficits.  Care Plan : LCSW Plan of Care  Updates made by Karolee Stamps, LCSW since 02/25/2021 12:00 AM     Problem: Find Help in My Community. Resolved 02/25/2021  Priority: High     Goal: Find Help in My Community. Completed 02/25/2021  Start Date: 02/12/2021  Expected End Date: 02/25/2021  This Visit's Progress: On track  Recent Progress: On track  Priority: High  Note:   Current Barriers:   Patient with Parkinson Disease, Osteoporosis, Frequent Falls and Caregiver Stress needs Support, Education, Referrals, Resources and Care Coordination to resolve unmet personal care needs in the home. Clinical Goals:  Patient will have In-Home Aide Services in place, either through [employer], Child psychotherapist II with the [employer] [facility] and [employer], or through a private agency of choice. Clinical Interventions: Caregiver Stress Acknowledged and Verbalization of Feelings Encouraged. Patient Goals/Self-Care Activities:  Mr. Klebba agreed to periodically follow-up with Juventino Slovak (# [phone] or # [phone]), Social Worker II with the [facility] Department of [facility] and [employer] In-Home Aide Program, to check the status of your application for services. Mr. Freeman will contact LCSW directly (# [phone]) if he has questions, needs assistance, or if additional social work needs are identified within the near future.   Mr. Castelo was encouraged to self-enroll in a Caregiver Support Group, from the list provided. Follow-Up Plan:  No Follow-Up Required, Per Husband.      Danford Bad LCSW Licensed Clinical Social Worker [facility] [phone]

## 2021-02-26 DIAGNOSIS — G20 Parkinson's disease: Secondary | ICD-10-CM

## 2021-02-26 DIAGNOSIS — F411 Generalized anxiety disorder: Secondary | ICD-10-CM

## 2021-02-26 DIAGNOSIS — R296 Repeated falls: Secondary | ICD-10-CM | POA: Diagnosis not present

## 2021-02-26 DIAGNOSIS — M810 Age-related osteoporosis without current pathological fracture: Secondary | ICD-10-CM | POA: Diagnosis not present

## 2021-03-16 ENCOUNTER — Ambulatory Visit (INDEPENDENT_AMBULATORY_CARE_PROVIDER_SITE_OTHER): Payer: Medicare PPO | Admitting: Psychiatry

## 2021-03-16 ENCOUNTER — Other Ambulatory Visit: Payer: Self-pay

## 2021-03-16 DIAGNOSIS — F331 Major depressive disorder, recurrent, moderate: Secondary | ICD-10-CM

## 2021-03-16 DIAGNOSIS — G4700 Insomnia, unspecified: Secondary | ICD-10-CM

## 2021-03-16 DIAGNOSIS — F411 Generalized anxiety disorder: Secondary | ICD-10-CM | POA: Diagnosis not present

## 2021-03-16 DIAGNOSIS — G20 Parkinson's disease: Secondary | ICD-10-CM

## 2021-03-16 DIAGNOSIS — F29 Unspecified psychosis not due to a substance or known physiological condition: Secondary | ICD-10-CM

## 2021-03-16 NOTE — Progress Notes (Signed)
Psychotherapy Progress Note [facility] Marliss Czar, PhD LP  Patient ID: Kelly Howard [facility] "[name]    MRN: [id number] Therapy format: Family therapy w/ patient -- accompanied by Thera Flake Date: 03/16/2021      Start: 3:15p     Stop: 4:05p     Time Spent: 50 min Location: In-person   Session narrative (presenting needs, interim history, self-report of stressors and symptoms, applications of prior therapy, status changes, and interventions made in session)  On Nuplazid 6 weeks now, H sees better sleep and less strong hallucinations.  For herself quiet, says she's nervous today.  Lost a Parkinson's friend last week, which is saddening and reminds her she will decline and die, too, probably of the same disease.  Less hallucinations, less experience of sighting "the other Len".  Sometimes visualizes children at home, but not really an alarming experience.  Sleep is better, urinating less at night, although she still has to get up for it.  Looking pale today.  Has had anemia in the past, may update test as it has been a while.  In my experience, that could be a factor in delirium, combined with a potentially dementing illness like Parkinson's.    Thinks she has a throat problem, though ENT has cleared her.  While there may be orthopedic issues, or perceptual issues associated with Parkinson's (and potential Lewy body disease), suggested that the neck craning and mouth breathing she tends to do may very well create drying and thickening of secretions that itself feels like a foreign object or issue.  Coached in nose breathing and good hydration as a fair test.  Coached in posture and deep breathing to relieve low energy.  Will have a personal caregiver coming in, two days 4 hrs, help fold clothes and other duties.  Encouraged in accepting and embracing the help, and partnering actively about needs and wishes to prevent "invalid" self-image.  Getting some walks outdoors.   Getting out to some soccer matches for grandchildren.  Visiting mother, who seems to be an inspiration for sturdiness.  Therapeutic modalities: Cognitive Behavioral Therapy, Solution-Oriented/Positive Psychology, and Ego-Supportive  Mental Status/Observations:  Appearance:   Casual and Neat     Behavior:  Appropriate  Motor:  Normal and somewhat tentative, but ambulating independently  Speech/Language:   Clear and Coherent  Affect:  Appropriate  Mood:  anxious and less  Thought process:  normal  Thought content:    worry  Sensory/Perceptual disturbances:    Illusions and more benign  Orientation:  Fully oriented  Attention:  Good    Concentration:  Good  Memory:  WNL  Insight:    Good  Judgment:   Good  Impulse Control:  Good   Risk Assessment: Danger to Self: No Self-injurious Behavior: No Danger to Others: No Physical Aggression / Violence: No Duty to Warn: No Access to Firearms a concern: No  Assessment of progress:  progressing  Diagnosis:   ICD-10-CM   1. 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 Insomnia, unspecified type  G47.00     3. Major depressive disorder, recurrent episode, moderate (HCC)  F33.1     4. Generalized anxiety disorder  F41.1     5. Parkinson disease (HCC)  G20      Plan:  Posture checks, deeper breathing, and hydration to test throat issue, and keep up hydration, for multiple reasons Continue walking and activities as tolerated for normalcy in lifestyle and  antidepression Engage personal caregiver constructively about needs, wishes, boundaries of standby and active assistance Check for anemia Other recommendations/advice as may be noted above Continue to utilize previously learned skills ad lib Maintain medication as prescribed and work faithfully with relevant prescriber(s) if any changes are desired or seem indicated Call the clinic on-call service, 988/hotline, present to ER, or call 911 if any  life-threatening psychiatric crisis Return in about 1 month (around 04/16/2021). Already scheduled visit in this office 04/09/2021.  Robley Fries, PhD Marliss Czar, PhD LP Clinical Psychologist, [facility] [address], [address] [place], [place] [address] [phone]

## 2021-03-25 ENCOUNTER — Telehealth: Payer: Medicare PPO

## 2021-03-25 ENCOUNTER — Ambulatory Visit: Payer: Medicare PPO

## 2021-03-25 ENCOUNTER — Other Ambulatory Visit: Payer: Self-pay

## 2021-03-25 ENCOUNTER — Telehealth: Payer: Self-pay

## 2021-03-25 NOTE — Telephone Encounter (Signed)
  Care Management   Follow Up Note   03/25/2021 Name: Kelly Howard MRN: [id number] DOB: September 06, 1950   Referred by: Philip Aspen, Limmie Patricia, MD Reason for referral : Chronic Care Management (RNCM: Follow up Outreach Chronic Care Management and coordination needs-unsuccessful outreach)   An unsuccessful telephone outreach was attempted today. The patient was referred to the case management team for assistance with care management and care coordination.   Follow Up Plan: The care management team will reach out to the patient again over the next 30 days.  Dudley Major RN, Maximiano Coss, CDE Care Management Coordinator [facility] [phone], Mobile [phone]

## 2021-03-30 DIAGNOSIS — R413 Other amnesia: Secondary | ICD-10-CM | POA: Diagnosis not present

## 2021-03-30 DIAGNOSIS — Z79899 Other long term (current) drug therapy: Secondary | ICD-10-CM | POA: Diagnosis not present

## 2021-03-30 DIAGNOSIS — R441 Visual hallucinations: Secondary | ICD-10-CM | POA: Diagnosis not present

## 2021-03-30 DIAGNOSIS — G20 Parkinson's disease: Secondary | ICD-10-CM | POA: Diagnosis not present

## 2021-04-02 DIAGNOSIS — N3000 Acute cystitis without hematuria: Secondary | ICD-10-CM | POA: Diagnosis not present

## 2021-04-09 ENCOUNTER — Ambulatory Visit: Payer: Medicare PPO | Admitting: Adult Health

## 2021-04-09 NOTE — Telephone Encounter (Signed)
Rescheduled for 04/12/21  Kelly Howard  Care Guide, Embedded Care Coordination [facility] Management  Direct Dial: [phone]

## 2021-04-12 ENCOUNTER — Ambulatory Visit (INDEPENDENT_AMBULATORY_CARE_PROVIDER_SITE_OTHER): Payer: Medicare PPO

## 2021-04-12 DIAGNOSIS — G20A1 Parkinson's disease without dyskinesia, without mention of fluctuations: Secondary | ICD-10-CM

## 2021-04-12 DIAGNOSIS — G20 Parkinson's disease: Secondary | ICD-10-CM

## 2021-04-12 DIAGNOSIS — M810 Age-related osteoporosis without current pathological fracture: Secondary | ICD-10-CM

## 2021-04-12 DIAGNOSIS — R296 Repeated falls: Secondary | ICD-10-CM

## 2021-04-12 NOTE — Patient Instructions (Addendum)
Visit Information  Patient will self administer medications as prescribed Patient will attend all scheduled provider appointments Patient will call pharmacy for medication refills Patient will attend church or other social activities Patient will call provider office for new concerns or questions - always use handrails on the stairs - always wear shoes or slippers with non-slip sole - get at least 10 minutes of activity every day - install bathroom grab bars - keep a flashlight by the bed - keep cell phone with me always - make an emergency alert plan in case I fall - pick up clutter from the floors - remove, or use a non-slip pad, with my throw rugs - use a cane or walker - use a nightlight in the bathroom  Patient verbalizes understanding of instructions provided today and agrees to view in MyChart.   Telephone follow up appointment with care management team member scheduled for: 05/13/21 at 1 PM [facility] RN, [facility], CDE Care Management Coordinator [facility] [phone], Mobile [phone]

## 2021-04-12 NOTE — Chronic Care Management (AMB) (Signed)
Chronic Care Management   CCM RN Visit Note  04/12/2021 Name: Kelly Howard MRN: [id number] DOB: 1951/05/04  Subjective: Kelly Howard is a 70 y.o. year old female who is a primary care patient of Philip Aspen, Limmie Patricia, MD. The care management team was consulted for assistance with disease management and care coordination needs.    Engaged with patient by telephone for follow up visit in response to provider referral for case management and/or care coordination services.   Consent to Services:  The patient was given information about Chronic Care Management services, agreed to services, and gave verbal consent prior to initiation of services.  Please see initial visit note for detailed documentation.   Patient agreed to services and verbal consent obtained.   Assessment: Review of patient past medical history, allergies, medications, health status, including review of consultants reports, laboratory and other test data, was performed as part of comprehensive evaluation and provision of chronic care management services.   SDOH (Social Determinants of Health) assessments and interventions performed:    CCM Care Plan  Allergies  Allergen Reactions   Codeine Other (See Comments)    GI Upset   Escitalopram Swelling and Other (See Comments)    Feet swell   Propoxyphene Other (See Comments)    Hallucinations    Outpatient Encounter Medications as of 04/12/2021  Medication Sig Note   acetaminophen (TYLENOL) 325 MG tablet Take 325-650 mg by mouth every 6 (six) hours as needed for mild pain.    AMBULATORY NON FORMULARY MEDICATION U step walker  Dx: G20 (Patient taking differently: See admin instructions. U step walker- Dx: G20)    AMBULATORY NON FORMULARY MEDICATION Abdominal compression binder Dx: G20    carbidopa-levodopa (SINEMET CR) 50-200 MG tablet TAKE 1 TABLET BY MOUTH EVERYDAY AT BEDTIME (Patient taking differently: Take 1 tablet by mouth at bedtime.)     carbidopa-levodopa (SINEMET IR) 25-100 MG tablet TAKE 1.5 TABLETS BY MOUTH 5 (FIVE) TIMES DAILY. 6am/9am/noon/3pm/5pm (Patient taking differently: Take 1.5 tablets by mouth See admin instructions. Take 1.5 tablets by mouth at 6 AM, 9 AM, 12 NOON, 3 PM, and 6 PM)    clonazePAM (KLONOPIN) 0.5 MG tablet Take 1 tablet (0.5 mg total) by mouth 3 (three) times daily as needed for anxiety. (Patient taking differently: Take 0.25 mg by mouth See admin instructions. Take 0.25 mg by mouth four to five times a day as needed for anxiousness)    ibandronate (BONIVA) 150 MG tablet Take 150 mg by mouth every 30 (thirty) days.    levothyroxine (SYNTHROID, LEVOTHROID) 50 MCG tablet Take 50 mcg by mouth daily before breakfast.    MYRBETRIQ 50 MG TB24 tablet Take 50 mg by mouth in the morning. (Patient not taking: Reported on 02/23/2021) 01/06/2021: The patient is receiving samples of this med at the present time   NUPLAZID 34 MG CAPS Take 1 capsule by mouth daily.    sertraline (ZOLOFT) 100 MG tablet TAKE 1 TABLET BY MOUTH EVERY DAY    No facility-administered encounter medications on file as of 04/12/2021.    Patient Active Problem List   Diagnosis Date Noted   Vitamin D deficiency 07/10/2019   Vitamin B12 deficiency 07/10/2019   Hypothyroidism    Frequent falls    Osteoporosis    Closed fracture of right distal radius 12/25/2018   Parkinson disease (HCC) 10/20/2011    Conditions to be addressed/monitored:Osteoporosis and Parkinson Disease, Frequent Falls)  Care Plan : RNCM:Osteoporosis (Adult)  Updates made by [place],  Barbee Cough, RN since 04/12/2021 12:00 AM  Completed 04/12/2021   Problem: Risk of harm or Injury related to hx of frequent falls and osteoporosis Resolved 04/12/2021  Priority: High     Long-Range Goal: Harm or Injury Prevented Completed 04/12/2021  Start Date: 08/27/2020  Expected End Date: 05/30/2021  Recent Progress: On track  Priority: High  Note:   Resolving due to duplicate goal   Current Barriers:  Knowledge Deficits related to fall precautions in patient with osteoporosis and Parkinson's disease pt has had over 4 falls in last year,  Decreased adherence to prescribed treatment for fall prevention Does not adhere to provider recommendations re: use of walker Does not adhere to prescribed psychotropic medication regimen Unable to perform IADLs independently-family assists as needed Knowledge Deficits related to fall prevention and safety Chronic Disease Management support and education needs related to fall prevention and safety, osteoporosis Spoke with [name].  States that pt has not had any falls this last month.  States  she is using her walker more.  States that they are not leaving her alone and their daughter is staying with them to help him Clinical Goal(s):  patient will demonstrate improved adherence to prescribed treatment plan for decreasing falls as evidenced by patient reporting and review of EMR patient will verbalize using fall risk reduction strategies discussed patient will not experience additional falls patient will verbalize understanding of plan for fall prevention and safety patient will meet with RN Care Manager to address educational needs for fall prevention and osteoporosis patient will attend all scheduled medical appointments: no scheduled primary care visit, neurology 03/30/21 patient will demonstrate improved adherence to prescribed treatment plan for fall prevention as evidenced by reported fewer falls Interventions:  Collaboration with Philip Aspen, Limmie Patricia, MD regarding development and update of comprehensive plan of care as evidenced by provider attestation and co-signature Inter-disciplinary care team collaboration (see longitudinal plan of care) Reviewed medications and discussed potential side effects of medications such as dizziness and frequent urination Assessed for falls since last encounter. Reinforced fall  risk prevention secondary to previously provided education. Evaluation of current treatment plan related to fall safety and patient's adherence to plan as established by provider. Reinforced to use walker at all times Reinforced education to patient re: fall prevention, safety and osteoporosis  Reviewed scheduled/upcoming provider appointments including: Dr. Daphane Shepherd neurology 03/30/21  no current primary care provider appt scheduled Discussed plans with patient for ongoing care management follow up and provided patient with direct contact information for care management team Reinforced to try to time her activities when she is feeling stronger and to use her seated walker so she can rest if needed Reinforced to drink adequate amounts of fluids and to get up slowly if she feels dizzy Self-Care Deficits:  Does not adhere to provider recommendations re: use of walker Unable to perform IADLs independently Patient Goals:  - Utilize walker (assistive device) appropriately with all ambulation - De-clutter walkways - Change positions slowly - Wear secure fitting shoes at all times with ambulation - Utilize home lighting for dim lit areas - Demonstrate self and pet awareness at all times - always use handrails on the stairs - always wear shoes or slippers with non-slip sole - get at least 10 minutes of activity every day - install bathroom grab bars - keep a flashlight by the bed - keep cell phone with me always - make an emergency alert plan in case I fall - pick up clutter from the  floors - remove, or use a non-slip pad, with my throw rugs - use a cane or walker at all times - use a nightlight in the bathroom Follow Up Plan: Telephone follow up appointment with care management team member scheduled for: 03/25/21 at 11:30 AM The patient has been provided with contact information for the care management team and has been advised to call with any health related questions or concerns.      Care  Plan : RNCM: Parkinson's disease  Updates made by Yetta Glassman, RN since 04/12/2021 12:00 AM  Completed 04/12/2021   Problem: Lack of coping skills for Parkinson's disease Resolved 04/12/2021  Priority: Medium     Long-Range Goal: Improved coping skills for Parkinson's disease Completed 04/12/2021  Start Date: 08/27/2020  Expected End Date: 05/30/2021  Recent Progress: On track  Priority: Medium  Note:   Resolving due to duplicate goal  Current Barriers:  Ineffective Self Health Maintenance for Parkinson's disease  Does not adhere to provider recommendations re:  Unable to perform IADLs independently Spoke with husband Amena Dockham.  States that pt's hallucinations became worse after they moved into their new home.  States that her new neurologist at [facility] was very helpful and she has started a new medication that is helping with the hallucinations.  States she is sleeping better and has less confusion.   States that pt did go to the urologist and she is now on a new medication to help with her bladder spasms.  States the new medication is helping with her frequent urination.  States he is taking pt to the [facility] several times a week to keep up with her exercise.  Clinical Goal(s):  Collaboration with Philip Aspen, Limmie Patricia, MD regarding development and update of comprehensive plan of care as evidenced by provider attestation and co-signature Inter-disciplinary care team collaboration (see longitudinal plan of care) patient will work with care management team to address care coordination and chronic disease management needs related to Disease Management Educational Needs   Interventions:  Evaluation of current treatment plan related to  Parkinson's disease , ADL IADL limitations and Mental Health Concerns  self-management and patient's adherence to plan as established by provider. Collaboration with Philip Aspen, Limmie Patricia, MD regarding development and update of comprehensive  plan of care as evidenced by provider attestation       and co-signature Inter-disciplinary care team collaboration (see longitudinal plan of care) Discussed plans with patient for ongoing care management follow up and provided patient with direct contact information for care management team Reinforced to continue to follow up with new neurology and counselor  Reinforced to continue going to gym for Parkinson's classes Reinforced on s/sx of UTI to call provider and encouraged to empty bladder frequently and to drink adequate amounts of fluids Reinforced to notify MD if she has swallowing issues or cough Reinforced need respite care and discussed caregiver stress. Agreeable to referral to CCM LCSW for caregvier stress and respite care-referral completed next LCSW visit 02/25/21 Self Care Activities:  Patient verbalizes understanding of plan to improve coping skills and self management of Parkinson's disease Self administers medications as prescribed Attends all scheduled provider appointments Calls provider office for new concerns or questions Patient Goals: - repeat what I heard to make sure I understand - bring a list of my medicines to the visit - speak up when I don't understand - keep a calendar with appointment dates - make a list of family or friends that I can call -  learn and use visualization or guided imagery - practice relaxation or meditation daily - start or continue a personal journal - talk about feelings with a friend, family or spiritual advisor -use walker at all times Call to schedule appt with provider  Follow Up Plan: Telephone follow up appointment with care management team member scheduled for: 03/25/21 at 11:30 AM The patient has been provided with contact information for the care management team and has been advised to call with any health related questions or concerns.      Care Plan : RN Care Manager Plan of Care  Updates made by Yetta Glassman, RN since  04/12/2021 12:00 AM     Problem: Chronic Disease Management and Care Coordination Needs(Parkinson Disease, Osteoporosis, Frequent Falls)   Priority: High     Long-Range Goal: Establish Plan of Care for Chronic Disease Management Needs (Parkinson Disease, Osteoporosis, Frequent Falls)   Start Date: 04/12/2021  Expected End Date: 10/09/2021  This Visit's Progress: On track  Priority: High  Note:   Current Barriers:  Chronic Disease Management support and education needs related to Osteoporosis and Parkinson Disease, Frequent Falls Cognitive Deficits Spoke with pt and husband Jomarie Longs.  States that pt had been more confused this last week after she got her Flu shot.  States she has a low grade fever for a few days.  States she has also started a patch for her confusion/Parkinson Disease that neurology prescribed so is in not sure if that is causing her confusion. States her mood has been good. States she has been seeing urology for her urinary frequency and states the medication is helping some with her nocturia.  States pt is eating good but her weight is down to 119 lb.  Pt states she had a fall yesterday but she was not using her walker  RNCM Clinical Goal(s):  Patient will verbalize understanding of plan for management of Osteoporosis and Parkinson Disease, Frequent Falls verbalize basic understanding of  Osteoporosis and Parkinson Disease, Frequent Falls disease process and self health management plan . take all medications exactly as prescribed and will call provider for medication related questions demonstrate Improved adherence to prescribed treatment plan for Osteoporosis and Parkinson Disease, Frequent Falls as evidenced by reports fewer falls, adherence to plan of care continue to work with RN Care Manager to address care management and care coordination needs related to  Osteoporosis and Parkinson Disease, Frequent Falls  through collaboration with RN Care manager, provider, and care  team.   Interventions: 1:1 collaboration with primary care provider regarding development and update of comprehensive plan of care as evidenced by provider attestation and co-signature Inter-disciplinary care team collaboration (see longitudinal plan of care) Evaluation of current treatment plan related to  self management and patient's adherence to plan as established by provider  Parkinson Disease Goal on track:  Yes Evaluation of current treatment plan related to  Parkinson Disease,  , ADL IADL limitations and Cognitive Deficits self-management and patient's adherence to plan as established by provider. Discussed plans with patient for ongoing care management follow up and provided patient with direct contact information for care management team Evaluation of current treatment plan related to Parkinson Disease, and patient's adherence to plan as established by provider; Advised patient to /husband to contact neurology if confusion does not improve and to contact urology if pt has s.sx of UTI; Provided education to patient re: Parkinson Disease,and s.sx of UTI to call provider; Discussed plans with patient for ongoing care management follow up  and provided patient with direct contact information for care management team; Reviewed importance of good nutrition and to consider using nutritional supplements to help pt maintain or gain weight   Falls Interventions: Goal on track:  NO. Reviewed medications and discussed potential side effects of medications such as dizziness and frequent urination; Advised patient of importance of notifying provider of falls; Assessed for falls since last encounter; Assessed patients knowledge of fall risk prevention secondary to previously provided education; Reinforced to use walker at all times  Patient Goals/Self-Care Activities: Patient will self administer medications as prescribed Patient will attend all scheduled provider appointments Patient will call  pharmacy for medication refills Patient will attend church or other social activities Patient will call provider office for new concerns or questions - always use handrails on the stairs - always wear shoes or slippers with non-slip sole - get at least 10 minutes of activity every day - install bathroom grab bars - keep a flashlight by the bed - keep cell phone with me always - make an emergency alert plan in case I fall - pick up clutter from the floors - remove, or use a non-slip pad, with my throw rugs - use a cane or walker - use a nightlight in the bathroom Follow Up Plan:  Telephone follow up appointment with care management team member scheduled for:  05/13/21 The patient has been provided with contact information for the care management team and has been advised to call with any health related questions or concerns.         Plan:Telephone follow up appointment with care management team member scheduled for:  05/13/21 The patient has been provided with contact information for the care management team and has been advised to call with any health related questions or concerns.  Dudley Major RN, Maximiano Coss, CDE Care Management Coordinator [facility] [phone], Mobile [phone]

## 2021-04-13 ENCOUNTER — Encounter: Payer: Self-pay | Admitting: Adult Health

## 2021-04-13 ENCOUNTER — Ambulatory Visit (INDEPENDENT_AMBULATORY_CARE_PROVIDER_SITE_OTHER): Payer: Medicare PPO | Admitting: Adult Health

## 2021-04-13 ENCOUNTER — Other Ambulatory Visit: Payer: Self-pay

## 2021-04-13 DIAGNOSIS — F410 Panic disorder [episodic paroxysmal anxiety] without agoraphobia: Secondary | ICD-10-CM

## 2021-04-13 DIAGNOSIS — F331 Major depressive disorder, recurrent, moderate: Secondary | ICD-10-CM | POA: Diagnosis not present

## 2021-04-13 DIAGNOSIS — F411 Generalized anxiety disorder: Secondary | ICD-10-CM

## 2021-04-13 DIAGNOSIS — G4700 Insomnia, unspecified: Secondary | ICD-10-CM

## 2021-04-13 MED ORDER — SERTRALINE HCL 100 MG PO TABS: ORAL_TABLET | ORAL | 3 refills | Status: DC

## 2021-04-13 MED ORDER — CLONAZEPAM 0.5 MG PO TABS: 0.5000 mg | ORAL_TABLET | Freq: Three times a day (TID) | ORAL | 2 refills | Status: DC | PRN

## 2021-04-13 NOTE — Progress Notes (Signed)
Kelly Howard [id number] [birth year] 70 y.o.  Subjective:   Patient ID:  Kelly Howard is a 70 y.o. (DOB Nov 17, 1950) female.  Chief Complaint: No chief complaint on file.   HPI Kelly Howard presents to the office today for follow-up of GAD, MDD, panic attacks, and insomnia.  Accompanied by husband.  Describes mood today as "ok". Tearful at times. Pleasant. Mood symptoms - denies depression and irritability. Reports increased anxiety. Stating "I feel nervous about everything". Feels like medications are helping. Husband also noting an improvement in symptoms with combination of medications. Not getting out much. Nervous about being around people. Went grocery shopping last night. Continues to have AH and VH - not as fearful - better overall. Daughter living with them and helping out. Improved interest and motivation. Taking medications as prescribed. Diagnosed with Parkinson's in 2010. Followed by [facility] neurologist. 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 Recently started on the Exelon patch. Energy levels improved. Active, has a regular exercise routine - exercise bike. Enjoys some usual interests and activities. Married. Lives with husband of 46 years. Has 2 daughters - 3 grandchildren. Mother is a 106 years old. Spending time with family. Appetite adequate. Weight loss - 119 from 131 pounds.  Sleeps better some nights than others. Averages 5 hours of solid sleep - then will have nights she sleeps well.  Focus and concentration difficulties. Completing tasks. Managing aspects of household. Retired. Denies SI or HI.  Denies AH. Positive for [facility] - seeing 2 of her husband.    Previous medication trials: Clonazepam, Zoloft, Paxil, Celexa   PHQ2-9    Flowsheet Row Chronic Care Management from 02/12/2021 in [facility] Chronic Care Management from 08/27/2020 in [facility] [place] Office Visit from 08/05/2020 in [facility] from 05/20/2020 in [facility] [employer] [facility] from 12/31/2019 in [facility] Total Score 1 4 4 1  0  PHQ-9 Total Score -- 7 8 -- 0      Flowsheet Row ED from 01/06/2021 in [facility]  C-SSRS RISK CATEGORY No Risk        Review of Systems:  Review of Systems  Musculoskeletal:  Negative for gait problem.  Neurological:  Negative for tremors.  Psychiatric/Behavioral:         Please refer to HPI   Medications: I have reviewed the patient's current medications.  Current Outpatient Medications  Medication Sig Dispense Refill   acetaminophen (TYLENOL) 325 MG tablet Take 325-650 mg by mouth every 6 (six) hours as needed for mild pain.     AMBULATORY NON FORMULARY MEDICATION U step walker  Dx: G20 (Patient taking differently: See admin instructions. U step walker- Dx: G20) 1 Device 0   AMBULATORY NON FORMULARY MEDICATION Abdominal compression binder Dx: G20 1 Device 0   carbidopa-levodopa (SINEMET CR) 50-200 MG tablet TAKE 1 TABLET BY MOUTH EVERYDAY AT BEDTIME (Patient taking differently: Take 1 tablet by mouth at bedtime.) 90 tablet 1   carbidopa-levodopa (SINEMET IR) 25-100 MG tablet TAKE 1.5 TABLETS BY MOUTH 5 (FIVE) TIMES DAILY. 6am/9am/noon/3pm/5pm (Patient taking differently: Take 1.5 tablets by mouth See admin instructions. Take 1.5 tablets by mouth at 6 AM, 9 AM, 12 NOON, 3 PM, and 6 PM) 675 tablet 1   clonazePAM (KLONOPIN) 0.5 MG tablet Take 1 tablet (0.5 mg total) by mouth 3 (three) times daily as needed for anxiety. 90 tablet 2   ibandronate (BONIVA)  150 MG tablet Take 150 mg by mouth every 30 (thirty) days.     levothyroxine (SYNTHROID, LEVOTHROID) 50 MCG tablet Take 50 mcg by mouth daily before breakfast.     MYRBETRIQ 50 MG TB24 tablet Take 50 mg by mouth in the morning. (Patient not taking: Reported on 02/23/2021)     NUPLAZID 34 MG CAPS Take 1 capsule by mouth  daily.     rivastigmine (EXELON) 4.6 mg/24hr 4.6 mg daily.     sertraline (ZOLOFT) 100 MG tablet TAKE 1 TABLET BY MOUTH EVERY DAY 90 tablet 3   No current facility-administered medications for this visit.    Medication Side Effects: None  Allergies:  Allergies  Allergen Reactions   Codeine Other (See Comments)    GI Upset   Escitalopram Swelling and Other (See Comments)    Feet swell   Propoxyphene Other (See Comments)    Hallucinations    Past Medical History:  Diagnosis Date   Frequent falls    Hypothyroidism    Osteoporosis    Parkinson's disease (HCC)     Past Medical History, Surgical history, Social history, and Family history were reviewed and updated as appropriate.   Please see review of systems for further details on the patient's review from today.   Objective:   Physical Exam:  LMP  (LMP Unknown) Comment: gyn  Physical Exam Constitutional:      General: She is not in acute distress. Musculoskeletal:        General: No deformity.  Neurological:     Mental Status: She is alert and oriented to person, place, and time.     Coordination: Coordination normal.  Psychiatric:        Attention and Perception: Attention and perception normal. She does not perceive auditory or visual hallucinations.        Mood and Affect: Mood normal. Mood is not anxious or depressed. Affect is not labile, blunt, angry or inappropriate.        Speech: Speech normal.        Behavior: Behavior normal.        Thought Content: Thought content normal. Thought content is not paranoid or delusional. Thought content does not include homicidal or suicidal ideation. Thought content does not include homicidal or suicidal plan.        Cognition and Memory: Cognition and memory normal.        Judgment: Judgment normal.     Comments: Insight intact    Lab Review:     Component Value Date/Time   NA 141 01/06/2021 1650   K 3.7 01/06/2021 1650   CL 104 01/06/2021 1650   CO2 26 01/06/2021  1650   GLUCOSE 112 (H) 01/06/2021 1650   BUN 19 01/06/2021 1650   CREATININE 0.61 01/06/2021 1650   CALCIUM 9.0 01/06/2021 1650   PROT 7.4 01/06/2021 1650   ALBUMIN 4.7 01/06/2021 1650   AST 17 01/06/2021 1650   ALT <5 01/06/2021 1650   ALKPHOS 54 01/06/2021 1650   BILITOT 0.5 01/06/2021 1650   GFRNONAA >60 01/06/2021 1650       Component Value Date/Time   WBC 4.6 01/06/2021 1650   RBC 3.77 (L) 01/06/2021 1650   HGB 11.1 (L) 01/06/2021 1650   HCT 35.0 (L) 01/06/2021 1650   PLT 272 01/06/2021 1650   MCV 92.8 01/06/2021 1650   MCH 29.4 01/06/2021 1650   MCHC 31.7 01/06/2021 1650   RDW 13.1 01/06/2021 1650   LYMPHSABS 0.7 01/06/2021 1650  MONOABS 0.5 01/06/2021 1650   EOSABS 0.0 01/06/2021 1650   BASOSABS 0.0 01/06/2021 1650    No results found for: POCLITH, LITHIUM   No results found for: PHENYTOIN, PHENOBARB, VALPROATE, CBMZ   .res Assessment: Plan:    Plan:  PDMP reviewed  Zoloft 100mg  daily Clonazepam 0.5mg  BID  Also taking Nueplazid and Exelon patch for Parkinson's psychosis  RTC 4 weeks  Patient advised to contact office with any questions, adverse effects, or acute worsening in signs and symptoms.  Discussed potential benefits, risk, and side effects of benzodiazepines to include potential risk of tolerance and dependence, as well as possible drowsiness.  Advised patient not to drive if experiencing drowsiness and to take lowest possible effective dose to minimize risk of dependence and tolerance.  Diagnoses and all orders for this visit:  Generalized anxiety disorder -     sertraline (ZOLOFT) 100 MG tablet; TAKE 1 TABLET BY MOUTH EVERY DAY -     clonazePAM (KLONOPIN) 0.5 MG tablet; Take 1 tablet (0.5 mg total) by mouth 3 (three) times daily as needed for anxiety.  Major depressive disorder, recurrent episode, moderate (HCC) -     sertraline (ZOLOFT) 100 MG tablet; TAKE 1 TABLET BY MOUTH EVERY DAY  Insomnia, unspecified type -     clonazePAM  (KLONOPIN) 0.5 MG tablet; Take 1 tablet (0.5 mg total) by mouth 3 (three) times daily as needed for anxiety.  Panic attacks -     clonazePAM (KLONOPIN) 0.5 MG tablet; Take 1 tablet (0.5 mg total) by mouth 3 (three) times daily as needed for anxiety.    Please see After Visit Summary for patient specific instructions.  Future Appointments  Date Time Provider [place]  05/06/2021  3:00 PM Blanchie Serve, PhD CP-CP None  05/13/2021  1:00 PM [facility]  06/08/2021  1:20 PM Antino Mayabb, Berdie Ogren, NP CP-CP None    No orders of the defined types were placed in this encounter.   -------------------------------

## 2021-04-19 ENCOUNTER — Ambulatory Visit: Payer: Medicare PPO | Admitting: Internal Medicine

## 2021-04-19 ENCOUNTER — Encounter: Payer: Self-pay | Admitting: Internal Medicine

## 2021-04-19 VITALS — BP 124/80 | HR 80 | Temp 98.0°F | Ht 65.0 in | Wt 118.2 lb

## 2021-04-19 DIAGNOSIS — G20 Parkinson's disease: Secondary | ICD-10-CM

## 2021-04-19 DIAGNOSIS — R410 Disorientation, unspecified: Secondary | ICD-10-CM

## 2021-04-19 DIAGNOSIS — Z79899 Other long term (current) drug therapy: Secondary | ICD-10-CM

## 2021-04-19 DIAGNOSIS — F419 Anxiety disorder, unspecified: Secondary | ICD-10-CM

## 2021-04-19 DIAGNOSIS — E039 Hypothyroidism, unspecified: Secondary | ICD-10-CM | POA: Diagnosis not present

## 2021-04-19 DIAGNOSIS — E538 Deficiency of other specified B group vitamins: Secondary | ICD-10-CM

## 2021-04-19 DIAGNOSIS — R300 Dysuria: Secondary | ICD-10-CM | POA: Diagnosis not present

## 2021-04-19 DIAGNOSIS — E559 Vitamin D deficiency, unspecified: Secondary | ICD-10-CM | POA: Diagnosis not present

## 2021-04-19 DIAGNOSIS — R350 Frequency of micturition: Secondary | ICD-10-CM

## 2021-04-19 LAB — CBC WITH DIFFERENTIAL/PLATELET
Basophils Absolute: 0 10*3/uL (ref 0.0–0.1)
Basophils Relative: 0.7 % (ref 0.0–3.0)
Eosinophils Absolute: 0 10*3/uL (ref 0.0–0.7)
Eosinophils Relative: 0.4 % (ref 0.0–5.0)
HCT: 35.7 % — ABNORMAL LOW (ref 36.0–46.0)
Hemoglobin: 11.9 g/dL — ABNORMAL LOW (ref 12.0–15.0)
Lymphocytes Relative: 10.8 % — ABNORMAL LOW (ref 12.0–46.0)
Lymphs Abs: 0.6 10*3/uL — ABNORMAL LOW (ref 0.7–4.0)
MCHC: 33.3 g/dL (ref 30.0–36.0)
MCV: 87.3 fl (ref 78.0–100.0)
Monocytes Absolute: 0.5 10*3/uL (ref 0.1–1.0)
Monocytes Relative: 8.9 % (ref 3.0–12.0)
Neutro Abs: 4.5 10*3/uL (ref 1.4–7.7)
Neutrophils Relative %: 79.2 % — ABNORMAL HIGH (ref 43.0–77.0)
Platelets: 283 10*3/uL (ref 150.0–400.0)
RBC: 4.09 Mil/uL (ref 3.87–5.11)
RDW: 13.4 % (ref 11.5–15.5)
WBC: 5.7 10*3/uL (ref 4.0–10.5)

## 2021-04-19 LAB — T4, FREE: Free T4: 0.84 ng/dL (ref 0.60–1.60)

## 2021-04-19 LAB — VITAMIN D 25 HYDROXY (VIT D DEFICIENCY, FRACTURES): VITD: 24.63 ng/mL — ABNORMAL LOW (ref 30.00–100.00)

## 2021-04-19 LAB — VITAMIN B12: Vitamin B-12: 243 pg/mL (ref 211–911)

## 2021-04-19 LAB — SEDIMENTATION RATE: Sed Rate: 8 mm/hr (ref 0–30)

## 2021-04-19 LAB — TSH: TSH: 0.33 u[IU]/mL — ABNORMAL LOW (ref 0.35–5.50)

## 2021-04-19 NOTE — Progress Notes (Signed)
Chief Complaint  Patient presents with   Follow-up    HPI: Kelly Howard 70 y.o. come in for  PCP NA herer with spouse caretaker.   Under care for parkinsons and GAD and on multiple meds  has  memory loss  and comes  Days of confusion    recently . Woke after nap yesterday  and confusion.  So checking for  causes.  Last check nov 4 at urologist and cystoscopy and was normal.  Herer to make sure no new  medical cause of change. No pain  less eating in past few days no fever cough. Head injury Newest med exelon patch.   ROS: See pertinent positives and negatives per HPI. Had flu shot 7th Had low b12 and had shots earlier in year  and then ? Oral.  Living arrangement spouse had moved July to safer architectural residence and daughter liver upstairs some help. May have caused some  problem. Past Medical History:  Diagnosis Date   Frequent falls    Hypothyroidism    Osteoporosis    Parkinson's disease ([place])     Family History  Problem Relation Age of Onset   Hypercalcemia Mother    Cancer Paternal Grandmother    Diabetes Paternal Grandfather     Social History   Socioeconomic History   Marital status: Married    Spouse name: Hamida Jiminez   Number of children: 2   Years of education: 12   Highest education level: 12th grade  Occupational History   Not on file  Tobacco Use   Smoking status: Never    Passive exposure: Past   Smokeless tobacco: Never  Vaping Use   Vaping Use: Never used  Substance and Sexual Activity   Alcohol use: Yes   Drug use: Never   Sexual activity: Not Currently  Other Topics Concern   Not on file  Social History Narrative   Not on file   Social Determinants of Health   Financial Resource Strain: Low Risk    Difficulty of Paying Living Expenses: Not hard at all  Food Insecurity: No Food Insecurity   Worried About Charity fundraiser in the Last Year: Never true   [place] in the Last Year: Never true  Transportation  Needs: No Transportation Needs   Lack of Transportation (Medical): No   Lack of Transportation (Non-Medical): No  Physical Activity: Inactive   Days of Exercise per Week: 0 days   Minutes of Exercise per Session: 0 min  Stress: Stress Concern Present   Feeling of Stress : To some extent  Social Connections: Moderately Integrated   Frequency of Communication with Friends and Family: More than three times a week   Frequency of Social Gatherings with Friends and Family: More than three times a week   Attends Religious Services: 1 to 4 times per year   Active Member of [employer] or Organizations: No   Attends Archivist Meetings: Never   Marital Status: Married    Outpatient Medications Prior to Visit  Medication Sig Dispense Refill   acetaminophen (TYLENOL) 325 MG tablet Take 325-650 mg by mouth every 6 (six) hours as needed for mild pain.     AMBULATORY NON FORMULARY MEDICATION U step walker  Dx: G20 (Patient taking differently: See admin instructions. U step walker- Dx: G20) 1 Device 0   AMBULATORY NON FORMULARY MEDICATION Abdominal compression binder Dx: G20 1 Device 0   carbidopa-levodopa (SINEMET CR) 50-200 MG tablet TAKE  1 TABLET BY MOUTH EVERYDAY AT BEDTIME (Patient taking differently: Take 1 tablet by mouth at bedtime.) 90 tablet 1   carbidopa-levodopa (SINEMET IR) 25-100 MG tablet TAKE 1.5 TABLETS BY MOUTH 5 (FIVE) TIMES DAILY. 6am/9am/noon/3pm/5pm (Patient taking differently: Take 1.5 tablets by mouth See admin instructions. Take 1.5 tablets by mouth at 6 AM, 9 AM, 12 NOON, 3 PM, and 6 PM) 675 tablet 1   clonazePAM (KLONOPIN) 0.5 MG tablet Take 1 tablet (0.5 mg total) by mouth 3 (three) times daily as needed for anxiety. 90 tablet 2   ibandronate (BONIVA) 150 MG tablet Take 150 mg by mouth every 30 (thirty) days.     levothyroxine (SYNTHROID, LEVOTHROID) 50 MCG tablet Take 50 mcg by mouth daily before breakfast.     NUPLAZID 34 MG CAPS Take 1 capsule by mouth daily.      rivastigmine (EXELON) 4.6 mg/24hr 4.6 mg daily.     sertraline (ZOLOFT) 100 MG tablet TAKE 1 TABLET BY MOUTH EVERY DAY 90 tablet 3   MYRBETRIQ 50 MG TB24 tablet Take 50 mg by mouth in the morning. (Patient not taking: Reported on 02/23/2021)     No facility-administered medications prior to visit.     EXAM:  BP 124/80 (BP Location: Left Arm, Patient Position: Sitting, Cuff Size: Normal)   Pulse 80   Temp 98 F (36.7 C) (Oral)   Ht 5\' 5"  (1.651 m)   Wt 118 lb 3.2 oz (53.6 kg)   LMP  (LMP Unknown) Comment: gyn  SpO2 97%   BMI 19.67 kg/m   Body mass index is 19.67 kg/m.  GENERAL: vitals reviewed and listed above, alert, o, appears well hydrated and in no acute distress  needs help with executive planning for getting g  up on table   knows spouse some limited speech no tremor resting  HEENT: atraumatic, conjunctiva  clear, no obvious abnormalities on inspection of external nose and ears OP : half masked  NECK: no obvious masses on inspection palpation  LUNGS: clear to auscultation bilaterally, no wheezes, rales or rhonchi, good air movement CV: HRRR, no clubbing cyanosis or  peripheral edema nl cap refill  Abdomen:  Sof,t normal bowel sounds without hepatosplenomegaly, no guarding rebound or masses no CVA tenderness Skin no bruises  MS: moves all extremities without noticeable focal  abnormality Neuro grossly non focal no rest tremor   forward shuffle gait  Lab Results  Component Value Date   WBC 4.6 01/06/2021   HGB 11.1 (L) 01/06/2021   HCT 35.0 (L) 01/06/2021   PLT 272 01/06/2021   GLUCOSE 112 (H) 01/06/2021   CHOL 191 08/05/2020   TRIG 83.0 08/05/2020   HDL 70.00 08/05/2020   LDLCALC 104 (H) 08/05/2020   ALT <5 01/06/2021   AST 17 01/06/2021   NA 141 01/06/2021   K 3.7 01/06/2021   CL 104 01/06/2021   CREATININE 0.61 01/06/2021   BUN 19 01/06/2021   CO2 26 01/06/2021   TSH 0.98 08/05/2020   HGBA1C 5.8 08/05/2020   BP Readings from Last 3 Encounters:  04/19/21  124/80  01/07/21 114/69  01/07/21 (!) 142/74   Lab Results  Component Value Date   VITAMINB12 191 (L) 08/05/2020     ASSESSMENT AND PLAN:  Discussed the following assessment and plan:  Confusion - Plan: Basic metabolic panel, CBC with Differential/Platelet, Hepatic function panel, TSH, T4, free, Vitamin B12, C-reactive protein, Sedimentation rate, Sedimentation rate, C-reactive protein, Vitamin B12, T4, free, TSH, Hepatic function panel, CBC with  Differential/Platelet, Basic metabolic panel  Vitamin 123456 deficiency - Plan: Basic metabolic panel, CBC with Differential/Platelet, Hepatic function panel, TSH, T4, free, Vitamin B12, C-reactive protein, Sedimentation rate, Sedimentation rate, C-reactive protein, Vitamin B12, T4, free, TSH, Hepatic function panel, CBC with Differential/Platelet, Basic metabolic panel  Parkinson disease (HCC) - Plan: Basic metabolic panel, CBC with Differential/Platelet, Hepatic function panel, TSH, T4, free, Vitamin B12, C-reactive protein, Sedimentation rate, Sedimentation rate, C-reactive protein, Vitamin B12, T4, free, TSH, Hepatic function panel, CBC with Differential/Platelet, Basic metabolic panel  Hypothyroidism, unspecified type - Plan: Basic metabolic panel, CBC with Differential/Platelet, Hepatic function panel, TSH, T4, free, Vitamin B12, C-reactive protein, Sedimentation rate, Sedimentation rate, C-reactive protein, Vitamin B12, T4, free, TSH, Hepatic function panel, CBC with Differential/Platelet, Basic metabolic panel  Anxiety - Plan: Basic metabolic panel, CBC with Differential/Platelet, Hepatic function panel, TSH, T4, free, Vitamin B12, C-reactive protein, Sedimentation rate, Sedimentation rate, C-reactive protein, Vitamin B12, T4, free, TSH, Hepatic function panel, CBC with Differential/Platelet, Basic metabolic panel  Frequent urination - Plan: Basic metabolic panel, CBC with Differential/Platelet, Hepatic function panel, TSH, T4, free, Vitamin  B12, C-reactive protein, Sedimentation rate, Urine Culture, Urine Culture, Sedimentation rate, C-reactive protein, Vitamin B12, T4, free, TSH, Hepatic function panel, CBC with Differential/Platelet, Basic metabolic panel  Medication management - Plan: Basic metabolic panel, CBC with Differential/Platelet, Hepatic function panel, TSH, T4, free, Vitamin B12, C-reactive protein, Sedimentation rate, Sedimentation rate, C-reactive protein, Vitamin B12, T4, free, TSH, Hepatic function panel, CBC with Differential/Platelet, Basic metabolic panel  Dysuria - Plan: Urine Culture, Urine Culture  Vitamin D deficiency - Plan: VITAMIN D 25 Hydroxy (Vit-D Deficiency, Fractures) Multiple causes of  possible  disease state meds new problem  r/o  -Patient advised to return or notify health care team  if  new concerns arise. Record review  assessment plan and follow 40 minutes    Patient Instructions  Good to see you today .   Checking for infection and lab work   other problems  ? If medications adding   or helping symptoms  Will notify of results when back .    Standley Brooking. Dashae Wilcher M.D.

## 2021-04-19 NOTE — Patient Instructions (Signed)
Good to see you today .   Checking for infection and lab work   other problems  ? If medications adding   or helping symptoms  Will notify of results when back .

## 2021-04-20 LAB — HEPATIC FUNCTION PANEL
ALT: 4 U/L (ref 0–35)
AST: 13 U/L (ref 0–37)
Albumin: 4.9 g/dL (ref 3.5–5.2)
Alkaline Phosphatase: 66 U/L (ref 39–117)
Bilirubin, Direct: 0.1 mg/dL (ref 0.0–0.3)
Total Bilirubin: 0.6 mg/dL (ref 0.2–1.2)
Total Protein: 7.4 g/dL (ref 6.0–8.3)

## 2021-04-20 LAB — BASIC METABOLIC PANEL
BUN: 15 mg/dL (ref 6–23)
CO2: 29 mEq/L (ref 19–32)
Calcium: 9.9 mg/dL (ref 8.4–10.5)
Chloride: 98 mEq/L (ref 96–112)
Creatinine, Ser: 0.71 mg/dL (ref 0.40–1.20)
GFR: 85.61 mL/min (ref >60.00)
Glucose, Bld: 93 mg/dL (ref 70–99)
Potassium: 4.2 mEq/L (ref 3.5–5.1)
Sodium: 138 mEq/L (ref 135–145)

## 2021-04-20 LAB — URINE CULTURE
MICRO NUMBER:: 12664178
SPECIMEN QUALITY:: ADEQUATE

## 2021-04-20 LAB — C-REACTIVE PROTEIN: CRP: <1 mg/dL (ref 0.5–20.0)

## 2021-04-20 NOTE — Progress Notes (Signed)
B12 in low normal range and anemia is the same   take  sublingual b12 every day  1000 - 2000( whatever available)   one thyroid test is borderline off but this could be from  illness  ...should fu  repeat  with tsh free t4 and t3  as per  PCP or specialist  ?.  Kidney function nl.   Urine culture pending.

## 2021-04-21 NOTE — Progress Notes (Signed)
No UTI noted on culture.

## 2021-04-27 ENCOUNTER — Other Ambulatory Visit: Payer: Self-pay | Admitting: Internal Medicine

## 2021-04-27 DIAGNOSIS — E559 Vitamin D deficiency, unspecified: Secondary | ICD-10-CM

## 2021-04-27 MED ORDER — VITAMIN D (ERGOCALCIFEROL) 1.25 MG (50000 UNIT) PO CAPS: 50000.0000 [IU] | ORAL_CAPSULE | ORAL | 0 refills | Status: AC

## 2021-04-28 DIAGNOSIS — M810 Age-related osteoporosis without current pathological fracture: Secondary | ICD-10-CM

## 2021-04-28 DIAGNOSIS — G20 Parkinson's disease: Secondary | ICD-10-CM

## 2021-05-05 DIAGNOSIS — R351 Nocturia: Secondary | ICD-10-CM | POA: Diagnosis not present

## 2021-05-05 DIAGNOSIS — R350 Frequency of micturition: Secondary | ICD-10-CM | POA: Diagnosis not present

## 2021-05-06 ENCOUNTER — Ambulatory Visit (INDEPENDENT_AMBULATORY_CARE_PROVIDER_SITE_OTHER): Payer: Medicare PPO | Admitting: Psychiatry

## 2021-05-06 ENCOUNTER — Other Ambulatory Visit: Payer: Self-pay

## 2021-05-06 DIAGNOSIS — F331 Major depressive disorder, recurrent, moderate: Secondary | ICD-10-CM

## 2021-05-06 DIAGNOSIS — F411 Generalized anxiety disorder: Secondary | ICD-10-CM | POA: Diagnosis not present

## 2021-05-06 DIAGNOSIS — F29 Unspecified psychosis not due to a substance or known physiological condition: Secondary | ICD-10-CM | POA: Diagnosis not present

## 2021-05-06 DIAGNOSIS — E538 Deficiency of other specified B group vitamins: Secondary | ICD-10-CM

## 2021-05-06 DIAGNOSIS — G20 Parkinson's disease: Secondary | ICD-10-CM | POA: Diagnosis not present

## 2021-05-06 DIAGNOSIS — E559 Vitamin D deficiency, unspecified: Secondary | ICD-10-CM

## 2021-05-06 NOTE — Progress Notes (Signed)
Psychotherapy Progress Note [facility] Marliss Czar, PhD LP  Patient ID: [name] "[name]    MRN: [id number] Therapy format: Family therapy w/ patient -- accompanied by Thera Flake Date: 05/06/2021      Start: 3:13p     Stop: 3:58p     Time Spent: 45 min Location: In-person   Session narrative (presenting needs, interim history, self-report of stressors and symptoms, applications of prior therapy, status changes, and interventions made in session) Recent EHR medical entries show low normal B12, continue supplementation, borderline thyroid results, checking for infection and UTI (test negative).  Anemia previously noted largely resolved.  Mild elevation glucose.  Vitamin D low.    On Exelon patch now, since Nov 1, but sometimes finding too energizing, so trying pulling it off before late evening.  On B12 supplementation and now yesterday began D supplementation.  Len says she has been up at night more lately, unable to sleep, down some afternoons.  Appetite fair, lost weight to 118.  Nutrition is fragmentary -- often a crisp apple in the morning (6am, helps clear throat), 7-8am oatmeal or poached egg, peanut butter sandwich maybe midday, but sounds to be a protein deficiency likely.  Noticeably falling appetite past week, may be about weather.  Identified likely protein deficiency, recommended nutrition choices.  Len looking to brighten up the house, get colors and familiar features from the old house back as aesthetic therapy.  Already trying to illuminate well by day.    For herself, [name] notes feeling Len gets irritable sometimes, enough that she can feel like she's not worth it.  Len acknowledges terrifying history of her father falling to his eventual death, and [name] having had a couple falls that required surgery, e.g., on wrists.  Contextualized and discussed ways to provide safety that may not come across as hovering so much, particularly re. stairs  and making sure [name] awaits help before trying them.  Does get daily contact with her mother, through video or phone.  Mother declining. But still active support to [place].  Clonazepam use ranges from 2-6 1/4 mg tabs.   Daughter Prentice Docker -- a recovering addict -- has now been 10 months clean and is trusted to administer it when Len can't.  A year ago this week a niece d. of Fentanyl.  Support/empathy provided.   Therapeutic modalities: Cognitive Behavioral Therapy and Solution-Oriented/Positive Psychology  Mental Status/Observations:  Appearance:   Casual and Neat     Behavior:  Appropriate  Motor:  Psychomotor Retardation  Speech/Language:   Clear, a little labored  Affect:  Constricted  Mood:  Some anxiety, less  Thought process:  normal  Thought content:    Minimal paranoia  Sensory/Perceptual disturbances:    WNL  Orientation:  Fully oriented  Attention:  Fair    Concentration:  Fair  Memory:  grossly intact  Insight:    Good  Judgment:   Fair  Impulse Control:  Good   Risk Assessment: Danger to Self: No Self-injurious Behavior: No Danger to Others: No Physical Aggression / Violence: No Duty to Warn: No Access to Firearms a concern: No  Assessment of progress:  progressing  Diagnosis:   ICD-10-CM   1. Generalized anxiety disorder  F41.1     2. 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 Major depressive disorder, recurrent episode, moderate (HCC)  F33.1     4. Parkinson disease (HCC)  G20  5. Vitamin D deficiency  E55.9     6. Vitamin B12 deficiency  E53.8      Plan:  Increase protein intake -- OK for Boost, Ensure, more eggs, meat of choice if not contraindicated Endorse a little signage for stairs to go with barriers Comply with vitamin supplementation Continue monitoring for UTI complications Continue working with Parkinson's-specific services Continue illuminating well by day for best circadian rhythm Option to  wean clonazepam gently -- see psychiatry for plan if interested [employer] and affirming her work on sobriety Other recommendations/advice as may be noted above Continue to utilize previously learned skills ad lib Maintain medication as prescribed and work faithfully with relevant prescriber(s) if any changes are desired or seem indicated Call the clinic on-call service, 988/hotline, 911, or present to [facility] or ER if any life-threatening psychiatric crisis Return 1-2 month, for time as available. Already scheduled visit in this office 06/08/2021.  Blanchie Serve, PhD Luan Moore, PhD LP Clinical Psychologist, [facility] [address] [place], [place] [address] [phone]

## 2021-05-10 DIAGNOSIS — J069 Acute upper respiratory infection, unspecified: Secondary | ICD-10-CM | POA: Diagnosis not present

## 2021-05-10 DIAGNOSIS — U071 COVID-19: Secondary | ICD-10-CM | POA: Diagnosis not present

## 2021-05-11 ENCOUNTER — Other Ambulatory Visit: Payer: Self-pay | Admitting: Internal Medicine

## 2021-05-11 ENCOUNTER — Telehealth: Payer: Self-pay

## 2021-05-11 DIAGNOSIS — E559 Vitamin D deficiency, unspecified: Secondary | ICD-10-CM

## 2021-05-11 NOTE — Telephone Encounter (Signed)
--  Caller states his wife has body aches, sinus pressure and has a productive cough. Denies fever  GOTO Facility Not Listed: [facility]  Patient sounds very sick or weak to the triager Doylene Canard, RN, Gean Maidens 05/10/2021 11:14:10AM  05/10/2021 11:20:12 AM Go to ED NOW Doylene Canard, RN, Lesa Disposition Overriden: Go to ED Now (or PCP triage) Override Reason: Patients symptoms need a higher level of care  05/11/21 1132: LVM to check on the patient; instructions left to call office. Would like to know outcome of UC visit.

## 2021-05-13 ENCOUNTER — Ambulatory Visit (INDEPENDENT_AMBULATORY_CARE_PROVIDER_SITE_OTHER): Payer: Medicare PPO

## 2021-05-13 DIAGNOSIS — M810 Age-related osteoporosis without current pathological fracture: Secondary | ICD-10-CM

## 2021-05-13 DIAGNOSIS — R296 Repeated falls: Secondary | ICD-10-CM

## 2021-05-13 DIAGNOSIS — G20A1 Parkinson's disease without dyskinesia, without mention of fluctuations: Secondary | ICD-10-CM

## 2021-05-13 DIAGNOSIS — F419 Anxiety disorder, unspecified: Secondary | ICD-10-CM

## 2021-05-13 DIAGNOSIS — G20 Parkinson's disease: Secondary | ICD-10-CM

## 2021-05-13 NOTE — Patient Instructions (Signed)
Visit Information  Thank you for taking time to visit with me today. Please don't hesitate to contact me if I can be of assistance to you before our next scheduled telephone appointment.  Following are the goals we discussed today:  Take all medications as prescribed Attend all scheduled provider appointments Call provider office for new concerns or questions  always use handrails on the stairs - always wear shoes or slippers with non-slip sole - get at least 10 minutes of activity every day - install bathroom grab bars - keep a flashlight by the bed - keep cell phone with me always - make an emergency alert plan in case I fall - pick up clutter from the floors - remove, or use a non-slip pad, with my throw rugs - use a cane or walker - use a nightlight in the bathroom Our next appointment is by telephone on 06/17/21 at 2:15 PM  Please call the care guide team at [phone] if you need to cancel or reschedule your appointment.   If you are experiencing a Mental Health or Behavioral Health Crisis or need someone to talk to, please call the Suicide and Crisis Lifeline: 988 call 1-800-273-TALK (toll free, 24 hour hotline) go to [facility] [address] [place] [phone]) call 911   Patient verbalizes understanding of instructions provided today and agrees to view in MyChart.  Dudley Major RN, Maximiano Coss, CDE Care Management Coordinator [facility] [phone], Mobile [phone]

## 2021-05-13 NOTE — Chronic Care Management (AMB) (Signed)
Chronic Care Management   CCM RN Visit Note  05/13/2021 Name: Kelly Howard MRN: [id number] DOB: [birth year]  Subjective: Kelly Howard is a 70 y.o. year old female who is a primary care patient of Philip Aspen, Limmie Patricia, MD. The care management team was consulted for assistance with disease management and care coordination needs.    Engaged with patient by telephone for follow up visit in response to provider referral for case management and/or care coordination services.   Consent to Services:  The patient was given information about Chronic Care Management services, agreed to services, and gave verbal consent prior to initiation of services.  Please see initial visit note for detailed documentation.   Patient agreed to services and verbal consent obtained.   Assessment: Review of patient past medical history, allergies, medications, health status, including review of consultants reports, laboratory and other test data, was performed as part of comprehensive evaluation and provision of chronic care management services.   SDOH (Social Determinants of Health) assessments and interventions performed:    CCM Care Plan  Allergies  Allergen Reactions   Codeine Other (See Comments)    GI Upset   Escitalopram Swelling and Other (See Comments)    Feet swell   Propoxyphene Other (See Comments)    Hallucinations    Outpatient Encounter Medications as of 05/13/2021  Medication Sig   rivastigmine (EXELON) 4.6 mg/24hr 4.6 mg daily.   acetaminophen (TYLENOL) 325 MG tablet Take 325-650 mg by mouth every 6 (six) hours as needed for mild pain.   AMBULATORY NON FORMULARY MEDICATION U step walker  Dx: G20 (Patient taking differently: See admin instructions. U step walker- Dx: G20)   AMBULATORY NON FORMULARY MEDICATION Abdominal compression binder Dx: G20   carbidopa-levodopa (SINEMET CR) 50-200 MG tablet TAKE 1 TABLET BY MOUTH EVERYDAY AT BEDTIME (Patient taking differently:  Take 1 tablet by mouth at bedtime.)   carbidopa-levodopa (SINEMET IR) 25-100 MG tablet TAKE 1.5 TABLETS BY MOUTH 5 (FIVE) TIMES DAILY. 6am/9am/noon/3pm/5pm (Patient taking differently: Take 1.5 tablets by mouth See admin instructions. Take 1.5 tablets by mouth at 6 AM, 9 AM, 12 NOON, 3 PM, and 6 PM)   clonazePAM (KLONOPIN) 0.5 MG tablet Take 1 tablet (0.5 mg total) by mouth 3 (three) times daily as needed for anxiety.   ibandronate (BONIVA) 150 MG tablet Take 150 mg by mouth every 30 (thirty) days.   levothyroxine (SYNTHROID, LEVOTHROID) 50 MCG tablet Take 50 mcg by mouth daily before breakfast.   NUPLAZID 34 MG CAPS Take 1 capsule by mouth daily.   sertraline (ZOLOFT) 100 MG tablet TAKE 1 TABLET BY MOUTH EVERY DAY   Vitamin D, Ergocalciferol, (DRISDOL) 1.25 MG (50000 UNIT) CAPS capsule Take 1 capsule (50,000 Units total) by mouth every 7 (seven) days for 12 doses.   No facility-administered encounter medications on file as of 05/13/2021.    Patient Active Problem List   Diagnosis Date Noted   Vitamin D deficiency 07/10/2019   Vitamin B12 deficiency 07/10/2019   Hypothyroidism    Frequent falls    Osteoporosis    Closed fracture of right distal radius 12/25/2018   Parkinson disease (HCC) 10/20/2011    Conditions to be addressed/monitored:Osteoporosis and Parkinson Disease, frequent falls  Care Plan : RN Care Manager Plan of Care  Updates made by Yetta Glassman, RN since 05/13/2021 12:00 AM     Problem: Chronic Disease Management and Care Coordination Needs(Parkinson Disease, Osteoporosis, Frequent Falls)   Priority: High  Long-Range Goal: Establish Plan of Care for Chronic Disease Management Needs (Parkinson Disease, Osteoporosis, Frequent Falls)   Start Date: 04/12/2021  Expected End Date: 10/09/2021  Recent Progress: On track  Priority: High  Note:   Current Barriers:  Chronic Disease Management support and education needs related to Osteoporosis and Parkinson Disease,  Frequent Falls Cognitive Deficits Spoke with pt and husband Jomarie Longs.  States that he and pt have both had COVID which made her more confused. States he has not been putting the Exelon patch since she has been sick  States her mood has been unchanged. States she has been seeing urology for her urinary frequency and states the medication is helping some with her nocturia.  States pt is eating fair.  States he has been trying to give her foods with more calories and protein.  Denies any recent falls but did have stumble when she was out to see her therapist but husband caught her.  States they did not have her walker with her at the time.  RNCM Clinical Goal(s):  Patient will verbalize understanding of plan for management of Osteoporosis and Parkinson Disease, Frequent Falls as evidenced by voiced adherence to plan of care verbalize basic understanding of  Osteoporosis and Parkinson Disease, Frequent Falls disease process and self health management plan as evidenced by voiced understanding and teach back take all medications exactly as prescribed and will call provider for medication related questions as evidenced by dispense report and pt verbalization demonstrate Improved adherence to prescribed treatment plan for Osteoporosis and Parkinson Disease, Frequent Falls as evidenced by reports fewer falls, adherence to plan of care continue to work with RN Care Manager to address care management and care coordination needs related to  Osteoporosis and Parkinson Disease, Frequent Falls as evidenced by adherence to CM Team Scheduled appointments through collaboration with RN Care manager, provider, and care team.   Interventions: 1:1 collaboration with primary care provider regarding development and update of comprehensive plan of care as evidenced by provider attestation and co-signature Inter-disciplinary care team collaboration (see longitudinal plan of care) Evaluation of current treatment plan related to   self management and patient's adherence to plan as established by provider   COVID Interventions:  (Status:  New goal.) Short Term Goal Provided education to patient to enhance basic understanding of COVID-19 as a viral disease, measures to prevent exposure, signs and symptoms, recommended vaccine schedule, when to contact provider Discussed effects, symptoms, and management of "long COVID"' Reviewed COVID precautions and quarantine  recommendations    Parkinson Disease Goal on track:  Yes Long Term Goal Evaluation of current treatment plan related to  Parkinson Disease,  , ADL IADL limitations and Cognitive Deficits self-management and patient's adherence to plan as established by provider. Discussed plans with patient for ongoing care management follow up and provided patient with direct contact information for care management team Evaluation of current treatment plan related to Parkinson Disease, and patient's adherence to plan as established by provider Provided education to patient re: Parkinson Disease,and s.sx of UTI to call provider Discussed plans with patient for ongoing care management follow up and provided patient with direct contact information for care management team Reinforced importance of good nutrition and to consider using nutritional supplements to help pt maintain or gain weight.   Falls Interventions:(Status:  Goal on track:  Yes.)  Long Term Goal Goal on track:  NO. Reviewed medications and discussed potential side effects of medications such as dizziness and frequent urination Advised patient of importance  of notifying provider of falls Assessed for falls since last encounter Assessed patients knowledge of fall risk prevention secondary to previously provided education Reinforced to use walker at all times and to take with them when she goes out  Patient Goals/Self-Care Activities: Take all medications as prescribed Attend all scheduled provider appointments Call  provider office for new concerns or questions  - always use handrails on the stairs - always wear shoes or slippers with non-slip sole - get at least 10 minutes of activity every day - install bathroom grab bars - keep a flashlight by the bed - keep cell phone with me always - make an emergency alert plan in case I fall - pick up clutter from the floors - remove, or use a non-slip pad, with my throw rugs - use a cane or walker - use a nightlight in the bathroom Follow Up Plan:  Telephone follow up appointment with care management team member scheduled for:  06/17/21 The patient has been provided with contact information for the care management team and has been advised to call with any health related questions or concerns.         Plan:Telephone follow up appointment with care management team member scheduled for:  06/17/21 The patient has been provided with contact information for the care management team and has been advised to call with any health related questions or concerns.  Dudley Major RN, Maximiano Coss, CDE Care Management Coordinator [facility] [phone], Mobile [phone]

## 2021-05-29 DIAGNOSIS — M810 Age-related osteoporosis without current pathological fracture: Secondary | ICD-10-CM

## 2021-06-02 DIAGNOSIS — S335XXA Sprain of ligaments of lumbar spine, initial encounter: Secondary | ICD-10-CM | POA: Diagnosis not present

## 2021-06-02 DIAGNOSIS — M545 Low back pain, unspecified: Secondary | ICD-10-CM | POA: Diagnosis not present

## 2021-06-02 DIAGNOSIS — M25562 Pain in left knee: Secondary | ICD-10-CM | POA: Diagnosis not present

## 2021-06-02 DIAGNOSIS — M1612 Unilateral primary osteoarthritis, left hip: Secondary | ICD-10-CM | POA: Diagnosis not present

## 2021-06-07 DIAGNOSIS — M1612 Unilateral primary osteoarthritis, left hip: Secondary | ICD-10-CM | POA: Diagnosis not present

## 2021-06-08 ENCOUNTER — Encounter: Payer: Self-pay | Admitting: Adult Health

## 2021-06-08 ENCOUNTER — Other Ambulatory Visit: Payer: Self-pay

## 2021-06-08 ENCOUNTER — Ambulatory Visit (INDEPENDENT_AMBULATORY_CARE_PROVIDER_SITE_OTHER): Payer: Medicare PPO | Admitting: Adult Health

## 2021-06-08 DIAGNOSIS — G4700 Insomnia, unspecified: Secondary | ICD-10-CM | POA: Diagnosis not present

## 2021-06-08 DIAGNOSIS — F331 Major depressive disorder, recurrent, moderate: Secondary | ICD-10-CM

## 2021-06-08 DIAGNOSIS — F410 Panic disorder [episodic paroxysmal anxiety] without agoraphobia: Secondary | ICD-10-CM | POA: Diagnosis not present

## 2021-06-08 DIAGNOSIS — F411 Generalized anxiety disorder: Secondary | ICD-10-CM | POA: Diagnosis not present

## 2021-06-08 MED ORDER — CLONAZEPAM 0.5 MG PO TABS: 0.5000 mg | ORAL_TABLET | Freq: Three times a day (TID) | ORAL | 2 refills | Status: DC | PRN

## 2021-06-08 NOTE — Progress Notes (Signed)
Kelly Howard [id number] June 16, 1950 71 y.o.  Subjective:   Patient ID:  Kelly Howard is a 71 y.o. (DOB 13-Jun-1950) female.  Chief Complaint: No chief complaint on file.   HPI Kelly Howard presents to the office today for follow-up of GAD, MDD, panic attacks, and insomnia.  Accompanied by husband.  Describes mood today as "ok". Tearful at times. Pleasant. Mood symptoms - denies depression and irritability. Reports increased anxiety at times. Stating "I feel anxious at times". Feels like medications are helpful. Has a care giver 2 days a week and daughter is there to assist on other days. Recently visited mother in [place] - just celebrated her 104th birthday. Improved interest and motivation. Taking medications as prescribed.  Diagnosed with Parkinson's in 2010. Followed by [facility] neurologist taking Nuplazid for Parkinson's psychosis  Energy levels improved. Active, does not have a regular exercise routine. Recovering from Covid - diagnosed over Christmas. Enjoys some usual interests and activities. Married. Lives with husband of 47 years. Has 2 daughters - 3 grandchildren. Mother is a 72 years old. Spending time with family. Appetite adequate. Weight loss - 118 to 116 pounds. Sleeps better some nights than others. Averages 4 to 5 hours of "good" sleep - goes to bed at 9 every night..  Focus and concentration difficulties. Completing tasks. Managing some aspects of household. Retired. Denies SI or HI.  Denies AH. Positive for VH - per husband - "better at times".   Previous medication trials: Clonazepam, Zoloft, Paxil, Celexa   PHQ2-9    Flowsheet Row Office Visit from 04/19/2021 in [place] [facility] [place] [facility] from 02/12/2021 in [place] [facility] [place] [facility] from 08/27/2020 in [place] at [place] from 08/05/2020 in [place] [facility] [place] from 05/20/2020 in  [place] [facility] [place]  PHQ-2 Total Score 4 1 4 4 1   PHQ-9 Total Score 19 -- 7 8 --      Flowsheet Row ED from 01/06/2021 in [place] DEPT  C-SSRS RISK CATEGORY No Risk        Review of Systems:  Review of Systems  Musculoskeletal:  Negative for gait problem.  Neurological:  Negative for tremors.  Psychiatric/Behavioral:         Please refer to HPI   Medications: I have reviewed the patient's current medications.  Current Outpatient Medications  Medication Sig Dispense Refill   acetaminophen (TYLENOL) 325 MG tablet Take 325-650 mg by mouth every 6 (six) hours as needed for mild pain.     AMBULATORY NON FORMULARY MEDICATION U step walker  Dx: G20 (Patient taking differently: See admin instructions. U step walker- Dx: G20) 1 Device 0   AMBULATORY NON FORMULARY MEDICATION Abdominal compression binder Dx: G20 1 Device 0   carbidopa-levodopa (SINEMET CR) 50-200 MG tablet TAKE 1 TABLET BY MOUTH EVERYDAY AT BEDTIME (Patient taking differently: Take 1 tablet by mouth at bedtime.) 90 tablet 1   carbidopa-levodopa (SINEMET IR) 25-100 MG tablet TAKE 1.5 TABLETS BY MOUTH 5 (FIVE) TIMES DAILY. 6am/9am/noon/3pm/5pm (Patient taking differently: Take 1.5 tablets by mouth See admin instructions. Take 1.5 tablets by mouth at 6 AM, 9 AM, 12 NOON, 3 PM, and 6 PM) 675 tablet 1   clonazePAM (KLONOPIN) 0.5 MG tablet Take 1 tablet (0.5 mg total) by mouth 3 (three) times daily as needed for anxiety. 90 tablet 2   ibandronate (BONIVA) 150 MG tablet Take 150 mg by mouth every 30 (thirty) days.  levothyroxine (SYNTHROID, LEVOTHROID) 50 MCG tablet Take 50 mcg by mouth daily before breakfast.     NUPLAZID 34 MG CAPS Take 1 capsule by mouth daily.     rivastigmine (EXELON) 4.6 mg/24hr 4.6 mg daily.     sertraline (ZOLOFT) 100 MG tablet TAKE 1 TABLET BY MOUTH EVERY DAY 90 tablet 3   Vitamin D, Ergocalciferol, (DRISDOL) 1.25 MG (50000 UNIT) CAPS capsule Take 1 capsule  (50,000 Units total) by mouth every 7 (seven) days for 12 doses. 12 capsule 0   No current facility-administered medications for this visit.    Medication Side Effects: None  Allergies:  Allergies  Allergen Reactions   Codeine Other (See Comments)    GI Upset   Escitalopram Swelling and Other (See Comments)    Feet swell   Propoxyphene Other (See Comments)    Hallucinations    Past Medical History:  Diagnosis Date   Frequent falls    Hypothyroidism    Osteoporosis    Parkinson's disease ([place])     Past Medical History, Surgical history, Social history, and Family history were reviewed and updated as appropriate.   Please see review of systems for further details on the patient's review from today.   Objective:   Physical Exam:  LMP  (LMP Unknown) Comment: gyn  Physical Exam Constitutional:      General: She is not in acute distress. Musculoskeletal:        General: No deformity.  Neurological:     Mental Status: She is alert and oriented to person, place, and time.     Coordination: Coordination normal.  Psychiatric:        Attention and Perception: Attention and perception normal. She does not perceive auditory or visual hallucinations.        Mood and Affect: Mood normal. Mood is not anxious or depressed. Affect is not labile, blunt, angry or inappropriate.        Speech: Speech normal.        Behavior: Behavior normal.        Thought Content: Thought content normal. Thought content is not paranoid or delusional. Thought content does not include homicidal or suicidal ideation. Thought content does not include homicidal or suicidal plan.        Cognition and Memory: Cognition and memory normal.        Judgment: Judgment normal.     Comments: Insight intact    Lab Review:     Component Value Date/Time   NA 138 04/19/2021 1507   K 4.2 04/19/2021 1507   CL 98 04/19/2021 1507   CO2 29 04/19/2021 1507   GLUCOSE 93 04/19/2021 1507   BUN 15 04/19/2021 1507    CREATININE 0.71 04/19/2021 1507   CALCIUM 9.9 04/19/2021 1507   PROT 7.4 04/19/2021 1507   ALBUMIN 4.9 04/19/2021 1507   AST 13 04/19/2021 1507   ALT 4 04/19/2021 1507   ALKPHOS 66 04/19/2021 1507   BILITOT 0.6 04/19/2021 1507   GFRNONAA >60 01/06/2021 1650       Component Value Date/Time   WBC 5.7 04/19/2021 1507   RBC 4.09 04/19/2021 1507   HGB 11.9 (L) 04/19/2021 1507   HCT 35.7 (L) 04/19/2021 1507   PLT 283.0 04/19/2021 1507   MCV 87.3 04/19/2021 1507   MCH 29.4 01/06/2021 1650   MCHC 33.3 04/19/2021 1507   RDW 13.4 04/19/2021 1507   LYMPHSABS 0.6 (L) 04/19/2021 1507   MONOABS 0.5 04/19/2021 1507   EOSABS 0.0 04/19/2021 1507  BASOSABS 0.0 04/19/2021 1507    No results found for: POCLITH, LITHIUM   No results found for: PHENYTOIN, PHENOBARB, VALPROATE, CBMZ   .res Assessment: Plan:     Plan:  PDMP reviewed  Zoloft 100mg  daily Clonazepam 0.5mg  BID  Also taking Nueplazid and Exelon patch for Parkinson's psychosis  RTC 3 months  Patient advised to contact office with any questions, adverse effects, or acute worsening in signs and symptoms.  Discussed potential benefits, risk, and side effects of benzodiazepines to include potential risk of tolerance and dependence, as well as possible drowsiness.  Advised patient not to drive if experiencing drowsiness and to take lowest possible effective dose to minimize risk of dependence and tolerance.  Diagnoses and all orders for this visit:  Major depressive disorder, recurrent episode, moderate (HCC)  Generalized anxiety disorder -     clonazePAM (KLONOPIN) 0.5 MG tablet; Take 1 tablet (0.5 mg total) by mouth 3 (three) times daily as needed for anxiety.  Insomnia, unspecified type -     clonazePAM (KLONOPIN) 0.5 MG tablet; Take 1 tablet (0.5 mg total) by mouth 3 (three) times daily as needed for anxiety.  Panic attacks -     clonazePAM (KLONOPIN) 0.5 MG tablet; Take 1 tablet (0.5 mg total) by mouth 3 (three)  times daily as needed for anxiety.     Please see After Visit Summary for patient specific instructions.  Future Appointments  Date Time Provider [place]  06/17/2021  2:15 PM [facility]  07/08/2021  3:00 PM Blanchie Serve, PhD CP-CP None  09/07/2021  1:20 PM Millena Callins, Berdie Ogren, NP CP-CP None    No orders of the defined types were placed in this encounter.   -------------------------------

## 2021-06-10 ENCOUNTER — Ambulatory Visit: Payer: Medicare PPO | Admitting: Neurology

## 2021-06-17 ENCOUNTER — Ambulatory Visit (INDEPENDENT_AMBULATORY_CARE_PROVIDER_SITE_OTHER): Payer: Medicare PPO

## 2021-06-17 DIAGNOSIS — M810 Age-related osteoporosis without current pathological fracture: Secondary | ICD-10-CM

## 2021-06-17 DIAGNOSIS — G20 Parkinson's disease: Secondary | ICD-10-CM

## 2021-06-17 DIAGNOSIS — R296 Repeated falls: Secondary | ICD-10-CM

## 2021-06-17 DIAGNOSIS — G20A1 Parkinson's disease without dyskinesia, without mention of fluctuations: Secondary | ICD-10-CM

## 2021-06-17 NOTE — Patient Instructions (Signed)
Visit Information  Thank you for taking time to visit with me today. Please don't hesitate to contact me if I can be of assistance to you before our next scheduled telephone appointment.  Following are the goals we discussed today:  Take all medications as prescribed Attend all scheduled provider appointments Call provider office for new concerns or questions  always use handrails on the stairs - always wear shoes or slippers with non-slip sole - get at least 10 minutes of activity every day - install bathroom grab bars - keep a flashlight by the bed - keep cell phone with me always - make an emergency alert plan in case I fall - pick up clutter from the floors - remove, or use a non-slip pad, with my throw rugs - use a cane or walker - use a nightlight in the bathroom  Our next appointment is by telephone on 07/16/21 at 2:15 PM  Please call the care guide team at [phone] if you need to cancel or reschedule your appointment.   If you are experiencing a Mental Health or [place] or need someone to talk to, please call the Suicide and Crisis Lifeline: 988 call the [place] National Suicide Prevention Lifeline: 470-104-5632 or TTY: (774)881-0786 TTY [phone]) to talk to a trained counselor call 1-800-273-TALK (toll free, 24 hour hotline) go to [facility] [address], [place] [phone]) call 911   Patient verbalizes understanding of instructions and care plan provided today and agrees to view in [place]. Active MyChart status confirmed with patient.   Peter Garter RN, Jackquline Denmark, CDE Care Management Coordinator [facility] [phone], Mobile [phone]

## 2021-06-17 NOTE — Chronic Care Management (AMB) (Signed)
Chronic Care Management   CCM RN Visit Note  06/17/2021 Name: Kelly Howard MRN: [id number] DOB: 02-05-1951  Subjective: Kelly Howard is a 71 y.o. year old female who is a primary care patient of Kelly Howard, Kelly Halsted, MD. The care management team was consulted for assistance with disease management and care coordination needs.    Engaged with patient by telephone for follow up visit in response to provider referral for case management and/or care coordination services.   Consent to Services:  The patient was given information about Chronic Care Management services, agreed to services, and gave verbal consent prior to initiation of services.  Please see initial visit note for detailed documentation.   Patient agreed to services and verbal consent obtained.   Assessment: Review of patient past medical history, allergies, medications, health status, including review of consultants reports, laboratory and other test data, was performed as part of comprehensive evaluation and provision of chronic care management services.   SDOH (Social Determinants of Health) assessments and interventions performed:    CCM Care Plan  Allergies  Allergen Reactions   Codeine Other (See Comments)    GI Upset   Escitalopram Swelling and Other (See Comments)    Feet swell   Propoxyphene Other (See Comments)    Hallucinations    Outpatient Encounter Medications as of 06/17/2021  Medication Sig   acetaminophen (TYLENOL) 325 MG tablet Take 325-650 mg by mouth every 6 (six) hours as needed for mild pain.   AMBULATORY NON FORMULARY MEDICATION U step walker  Dx: G20 (Patient taking differently: See admin instructions. U step walker- Dx: G20)   AMBULATORY NON FORMULARY MEDICATION Abdominal compression binder Dx: G20   carbidopa-levodopa (SINEMET CR) 50-200 MG tablet TAKE 1 TABLET BY MOUTH EVERYDAY AT BEDTIME (Patient taking differently: Take 1 tablet by mouth at bedtime.)    carbidopa-levodopa (SINEMET IR) 25-100 MG tablet TAKE 1.5 TABLETS BY MOUTH 5 (FIVE) TIMES DAILY. 6am/9am/noon/3pm/5pm (Patient taking differently: Take 1.5 tablets by mouth See admin instructions. Take 1.5 tablets by mouth at 6 AM, 9 AM, 12 NOON, 3 PM, and 6 PM)   clonazePAM (KLONOPIN) 0.5 MG tablet Take 1 tablet (0.5 mg total) by mouth 3 (three) times daily as needed for anxiety.   ibandronate (BONIVA) 150 MG tablet Take 150 mg by mouth every 30 (thirty) days.   levothyroxine (SYNTHROID, LEVOTHROID) 50 MCG tablet Take 50 mcg by mouth daily before breakfast.   NUPLAZID 34 MG CAPS Take 1 capsule by mouth daily.   rivastigmine (EXELON) 4.6 mg/24hr 4.6 mg daily.   sertraline (ZOLOFT) 100 MG tablet TAKE 1 TABLET BY MOUTH EVERY DAY   Vitamin D, Ergocalciferol, (DRISDOL) 1.25 MG (50000 UNIT) CAPS capsule Take 1 capsule (50,000 Units total) by mouth every 7 (seven) days for 12 doses.   No facility-administered encounter medications on file as of 06/17/2021.    Patient Active Problem List   Diagnosis Date Noted   Vitamin D deficiency 07/10/2019   Vitamin B12 deficiency 07/10/2019   Hypothyroidism    Frequent falls    Osteoporosis    Closed fracture of right distal radius 12/25/2018   Parkinson disease ([place]) 10/20/2011    Conditions to be addressed/monitored:Osteoporosis and Parkinson Disease, falls  Care Plan : RN Care Manager Plan of Care  Updates made by Kelly Ped, RN since 06/17/2021 12:00 AM     Problem: Chronic Disease Management and Care Coordination Needs(Parkinson Disease, Osteoporosis, Frequent Falls)   Priority: High     Long-Range  Goal: Establish Plan of Care for Chronic Disease Management Needs (Parkinson Disease, Osteoporosis, Frequent Falls)   Start Date: 04/12/2021  Expected End Date: 10/09/2021  Recent Progress: On track  Priority: High  Note:   Current Barriers:  Chronic Disease Management support and education needs related to Osteoporosis and Parkinson  Disease, Frequent Falls Cognitive Deficits Spoke with pt and [name].  States pt has fully recovered from her COVID. States she has been wearing the Exelon patch.  States pt has been stronger with less confusion/hallucinations  and anxiety.  States she has been eating and sleeping better.  States her urinary frequency has improved   States he has been trying to give her foods with more calories and protein.  Denies any recent falls.  States she is using the walker more.  Pt states she does have sinus drainage that bothers her sometimes.  Denies and choking or difficulty swallowing. RNCM Clinical Goal(s):  Patient will verbalize understanding of plan for management of Osteoporosis and Parkinson Disease, Frequent Falls as evidenced by voiced adherence to plan of care verbalize basic understanding of  Osteoporosis and Parkinson Disease, Frequent Falls disease process and self health management plan as evidenced by voiced understanding and teach back take all medications exactly as prescribed and will call provider for medication related questions as evidenced by dispense report and pt verbalization attend all scheduled medical appointments: Neurology 06/30/21 as evidenced by medical records demonstrate Improved adherence to prescribed treatment plan for Osteoporosis and Parkinson Disease, Frequent Falls as evidenced by reports fewer falls, adherence to plan of care continue to work with RN Care Manager to address care management and care coordination needs related to  Osteoporosis and Parkinson Disease, Frequent Falls as evidenced by adherence to CM Team Scheduled appointments through collaboration with RN Care manager, provider, and care team.   Interventions: 1:1 collaboration with primary care provider regarding development and update of comprehensive plan of care as evidenced by provider attestation and co-signature Inter-disciplinary care team collaboration (see longitudinal plan of  care) Evaluation of current treatment plan related to  self management and patient's adherence to plan as established by provider   COVID Interventions:  (Status:  Goal Met.) Short Term Goal Reviewed to continue to practice COVID/upper respiratory precautions     Parkinson Disease Goal on track:  Yes Long Term Goal Evaluation of current treatment plan related to  Parkinson Disease,  , ADL IADL limitations and Cognitive Deficits self-management and patient's adherence to plan as established by provider. Discussed plans with patient for ongoing care management follow up and provided patient with direct contact information for care management team Evaluation of current treatment plan related to Parkinson Disease, and patient's adherence to plan as established by provider Provided education to patient re: Parkinson Disease,and to discuss sinus and secretion issues with neurologist Discussed plans with patient for ongoing care management follow up and provided patient with direct contact information for care management team Reinforced importance of good nutrition and to consider using nutritional supplements to help pt maintain or gain weight.  Reviewed to try to get back to going to gym for regular exercise  Falls Interventions:(Status:  Goal on track:  Yes.)  Long Term Goal Goal on track:  NO. Reviewed medications and discussed potential side effects of medications such as dizziness and frequent urination Advised patient of importance of notifying provider of falls Assessed for falls since last encounter Assessed patients knowledge of fall risk prevention secondary to previously provided education Reinforced to use walker  at all times and to take with them when she goes out . Encouraged to exercise regularly   Patient Goals/Self-Care Activities: Take all medications as prescribed Attend all scheduled provider appointments Call provider office for new concerns or questions  - always use  handrails on the stairs - always wear shoes or slippers with non-slip sole - get at least 10 minutes of activity every day - install bathroom grab bars - keep a flashlight by the bed - keep cell phone with me always - make an emergency alert plan in case I fall - pick up clutter from the floors - remove, or use a non-slip pad, with my throw rugs - use a cane or walker - use a nightlight in the bathroom Follow Up Plan:  Telephone follow up appointment with care management team member scheduled for:  07/16/21/23 The patient has been provided with contact information for the care management team and has been advised to call with any health related questions or concerns.         Plan:Telephone follow up appointment with care management team member scheduled for:  07/16/21 The patient has been provided with contact information for the care management team and has been advised to call with any health related questions or concerns.  Peter Garter RN, Jackquline Denmark, CDE Care Management Coordinator [facility] [phone], Mobile [phone]

## 2021-06-29 DIAGNOSIS — M810 Age-related osteoporosis without current pathological fracture: Secondary | ICD-10-CM

## 2021-06-30 DIAGNOSIS — G20 Parkinson's disease: Secondary | ICD-10-CM | POA: Diagnosis not present

## 2021-06-30 DIAGNOSIS — Z79899 Other long term (current) drug therapy: Secondary | ICD-10-CM | POA: Diagnosis not present

## 2021-06-30 DIAGNOSIS — R441 Visual hallucinations: Secondary | ICD-10-CM | POA: Diagnosis not present

## 2021-06-30 DIAGNOSIS — R413 Other amnesia: Secondary | ICD-10-CM | POA: Diagnosis not present

## 2021-07-08 ENCOUNTER — Other Ambulatory Visit: Payer: Self-pay

## 2021-07-08 ENCOUNTER — Ambulatory Visit (INDEPENDENT_AMBULATORY_CARE_PROVIDER_SITE_OTHER): Payer: Medicare PPO | Admitting: Psychiatry

## 2021-07-08 DIAGNOSIS — F331 Major depressive disorder, recurrent, moderate: Secondary | ICD-10-CM | POA: Diagnosis not present

## 2021-07-08 DIAGNOSIS — E559 Vitamin D deficiency, unspecified: Secondary | ICD-10-CM

## 2021-07-08 DIAGNOSIS — G20 Parkinson's disease: Secondary | ICD-10-CM

## 2021-07-08 DIAGNOSIS — F29 Unspecified psychosis not due to a substance or known physiological condition: Secondary | ICD-10-CM | POA: Diagnosis not present

## 2021-07-08 DIAGNOSIS — F411 Generalized anxiety disorder: Secondary | ICD-10-CM | POA: Diagnosis not present

## 2021-07-08 DIAGNOSIS — E538 Deficiency of other specified B group vitamins: Secondary | ICD-10-CM

## 2021-07-08 NOTE — Progress Notes (Signed)
Psychotherapy Progress Note [facility] Luan Moore, PhD LP  Patient ID: Kelly Howard [facility] "Kelly Howard    MRN: [id number] Therapy format: Family therapy w/ patient -- accompanied by Augustin Coupe Date: 07/08/2021      Start: 3:27p     Stop: 4:17p     Time Spent: 50 min Location: In-person   Session narrative (presenting needs, interim history, self-report of stressors and symptoms, applications of prior therapy, status changes, and interventions made in session) Neurologist 2/1, still on Exelon, Nuplazid still helping with confusion and hallucinations.  Cavity filled last week, seems to be a bit more confused and lower energy since then.  On clonazepam right now, about 45 minutes in, which has helped.  Advised she can either walk or use deep breathing skill for situations like waiting room.    Since last seen has enjoyed mother's 104th birthday, though she is worried about her and tends to contradict things Len says about her needs and welfare.  Ultimately agrees there are limits to what she can do besides talk on the phone or video, visit some, and organize local social support.  Has been able to travel and visit early January, warming up to another.  Re. adjustment to the new house, they have in-home help 3 days a week (Kat) for companionship, and Len is feeling like the layout and the staffing combination.  For her part, Kelly Howard feels disoriented still, not yet at home in the new house (since July).  Seems to have illusion she has only been there since last night, and she has 4 copies of her things and duplicates of rooms in the house, but she is able to respond to questions about where things are.  Toward better orientation and comfort, led her to imagine navigating through the house for different purposes, which way she would turn, things she notices.  Good visual memory from the sound of it.  Encouraged her and Len to find reasons to explore further.  Also discussed  decorating, which Kelly Howard has enjoyed before.  Coached Len also in tempering expressions of frustration and playfully recruiting [name]'s awareness of how to get around.  Realized she really hasn't been upstairs due to abundance of caution about her on steps, but since the standard is not to take steps unaccompanied, it would be OK to accompany, and probably helpful to her orientation.  Therapeutic modalities: Cognitive Behavioral Therapy and Solution-Oriented/Positive Psychology  Mental Status/Observations:  Appearance:   Neat     Behavior:  Appropriate  Motor:  Gait affected  Speech/Language:   Clear and Coherent  Affect:  Appropriate  Mood:  anxious and less  Thought process:  normal  Thought content:    Ilusions  Sensory/Perceptual disturbances:    WNL  Orientation:  grossly intact  Attention:  Good    Concentration:  Fair  Memory:  Affected by perceptions  Insight:    Fair  Judgment:   Good  Impulse Control:  Fair   Risk Assessment: Danger to Self: No Self-injurious Behavior: No Danger to Others: No Physical Aggression / Violence: No Duty to Warn: No Access to Firearms a concern: No  Assessment of progress:  progressing  Diagnosis:   ICD-10-CM   1. Generalized anxiety disorder  F41.1     2. 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 Major depressive disorder, recurrent episode, moderate (HCC)  F33.1     4. Parkinson  disease ([place])  G20     5. Vitamin D deficiency  E55.9     6. Vitamin B12 deficiency  E53.8      Plan:  For home orientation Venture upstairs, with help, satisfy curiosity about what's up there Explore ideas for decorating that will put her own touch on the place Support (Len) If frustrated by mental status, try to refrain from expressing amazement she doesn't have something straight, focus more on empathy and the next reasonable thing to orient When Kelly Howard expresses negatives,  try to acknowledge empathically, not just try to cheer up Anxiety -- use deep breathing or walking (accompanied) for restless situations, Klonopin as directed if not enough Nutrition -- as before with protein, OK for Boost as needed, vitamins recommended UTI risk -- continue testing as directed by psychiatry and urology Other recommendations/advice as may be noted above Continue to utilize previously learned skills ad lib Maintain medication as prescribed and work faithfully with relevant prescriber(s) if any changes are desired or seem indicated Call the clinic on-call service, 988/hotline, 911, or present to [facility] or ER if any life-threatening psychiatric crisis Return in about 2 months (around 09/05/2021). Already scheduled visit in this office 09/07/2021.  Blanchie Serve, PhD Luan Moore, PhD LP Clinical Psychologist, [facility] [address] [place], [place] [address] [phone]

## 2021-07-16 ENCOUNTER — Telehealth: Payer: Self-pay | Admitting: Internal Medicine

## 2021-07-16 ENCOUNTER — Ambulatory Visit (INDEPENDENT_AMBULATORY_CARE_PROVIDER_SITE_OTHER): Payer: Medicare PPO

## 2021-07-16 DIAGNOSIS — M810 Age-related osteoporosis without current pathological fracture: Secondary | ICD-10-CM

## 2021-07-16 DIAGNOSIS — E039 Hypothyroidism, unspecified: Secondary | ICD-10-CM

## 2021-07-16 DIAGNOSIS — R296 Repeated falls: Secondary | ICD-10-CM

## 2021-07-16 DIAGNOSIS — G20 Parkinson's disease: Secondary | ICD-10-CM

## 2021-07-16 NOTE — Patient Instructions (Addendum)
Visit Information  Thank you for taking time to visit with me today. Please don't hesitate to contact me if I can be of assistance to you before our next scheduled telephone appointment.  Following are the goals we discussed today:  Take all medications as prescribed Attend all scheduled provider appointments Call provider office for new concerns or questions  always use handrails on the stairs - always wear shoes or slippers with non-slip sole - get at least 10 minutes of activity every day - install bathroom grab bars - keep a flashlight by the bed - keep cell phone with me always - make an emergency alert plan in case I fall - pick up clutter from the floors - remove, or use a non-slip pad, with my throw rugs - use a cane or walker - use a nightlight in the bathroom  Osteoporosis Osteoporosis is when the bones get thin and weak. This can cause your bones to break (fracture) more easily. What are the causes? The exact cause of this condition is not known. What increases the risk? Having family members with this condition. Not eating enough healthy foods. Taking certain medicines. Being female. Being age 37 or older. Smoking or using other products that contain nicotine or tobacco, such as e-cigarettes or chewing tobacco. Not exercising. Being of European or Asian ancestry. Having a small body frame. What are the signs or symptoms? A broken bone might be the first sign, especially if the break results from a fall or injury that usually would not cause a bone to break. Other signs and symptoms include: Pain in the neck or low back. Being hunched over (stooped posture). Getting shorter. How is this treated? Eating more foods with more calcium and vitamin D in them. Doing exercises. Stopping tobacco use. Limiting how much alcohol you drink. Taking medicines to slow bone loss or help make the bones stronger. Taking supplements of calcium and vitamin D every day. Taking  medicines to replace chemicals in the body (hormone replacement medicines). Monitoring your levels of calcium and vitamin D. The goal of treatment is to strengthen your bones and lower your risk for a bone break. Follow these instructions at home: Eating and drinking Eat plenty of calcium and vitamin D. These nutrients are good for your bones. Good sources of calcium and vitamin D include: Some fish, such as salmon and tuna. Foods that have calcium and vitamin D added to them (fortified foods), such as some breakfast cereals. Egg yolks. Cheese. Liver.  Activity Do exercises as told by your doctor. Ask your doctor what exercises are safe for you. You should do: Exercises that make your muscles work to hold your body weight up (weight-bearing exercises). These include tai chi, yoga, and walking. Exercises to make your muscles stronger. One example is lifting weights. Lifestyle Do not drink alcohol if: Your doctor tells you not to drink. You are pregnant, may be pregnant, or are planning to become pregnant. If you drink alcohol: Limit how much you use to: 0-1 drink a day for women. 0-2 drinks a day for men. Know how much alcohol is in your drink. In the [place] one drink equals one 12 oz bottle of beer (355 mL), one 5 oz glass of wine (148 mL), or one 1 oz glass of hard liquor (44 mL). Do not smoke or use any products that contain nicotine or tobacco. If you need help quitting, ask your doctor. Preventing falls Use tools to help you move around (mobility aids) as  needed. These include canes, walkers, scooters, and crutches. Keep rooms well-lit. Put away things on the floor that could make you trip. These include cords and rugs. Install safety rails on stairs. Install grab bars in bathrooms. Use rubber mats in slippery areas, like bathrooms. Wear shoes that: Fit you well. Support your feet. Have closed toes. Have rubber soles or low heels. Tell your doctor about all of the medicines  you are taking. Some medicines can make you more likely to fall. General instructions Take over-the-counter and prescription medicines only as told by your doctor. Keep all follow-up visits. Contact a doctor if: You have not been tested (screened) for osteoporosis and you are: A woman who is age 39 or older. A man who is age 27 or older. Get help right away if: You fall. You get hurt. Summary Osteoporosis happens when your bones get thin and weak. Weak bones can break (fracture) more easily. Eat plenty of calcium and vitamin D. These are good for your bones. Tell your doctor about all of the medicines that you take. This information is not intended to replace advice given to you by your health care provider. Make sure you discuss any questions you have with your health care provider. Document Revised: 10/31/2019 Document Reviewed: 10/31/2019 Elsevier Patient Education  2022 [place] next appointment is by telephone on 08/13/21 at 10:45 AM  Please call the care guide team at [phone] if you need to cancel or reschedule your appointment.   If you are experiencing a Mental Health or [place] or need someone to talk to, please call the Suicide and Crisis Lifeline: 988 call the [place] National Suicide Prevention Lifeline: (331)641-1857 or TTY: 873-053-2311 TTY (403) 675-3874) to talk to a trained counselor call 1-800-273-TALK (toll free, 24 hour hotline) go to [facility] [address] [place] [phone]) call 911   Patient verbalizes understanding of instructions and care plan provided today and agrees to view in [place]. Active MyChart status confirmed with patient.    Peter Garter RN, Jackquline Denmark, CDE Care Management Coordinator [facility] [phone]

## 2021-07-16 NOTE — Chronic Care Management (AMB) (Signed)
Chronic Care Management   CCM RN Visit Note  07/16/2021 Name: Kelly Howard MRN: [id number] DOB: Jan 01, 1951  Subjective: Kelly Howard is a 71 y.o. year old female who is a primary care patient of Kelly Howard, Kelly Patricia, MD. The care management team was consulted for assistance with disease management and care coordination needs.    Engaged with patient by telephone for follow up visit in response to provider referral for case management and/or care coordination services.   Consent to Services:  The patient was given information about Chronic Care Management services, agreed to services, and gave verbal consent prior to initiation of services.  Please see initial visit note for detailed documentation.   Patient agreed to services and verbal consent obtained.   Assessment: Review of patient past medical history, allergies, medications, health status, including review of consultants reports, laboratory and other test data, was performed as part of comprehensive evaluation and provision of chronic care management services.   SDOH (Social Determinants of Health) assessments and interventions performed:    CCM Care Plan  Allergies  Allergen Reactions   Codeine Other (See Comments)    GI Upset   Escitalopram Swelling and Other (See Comments)    Feet swell   Propoxyphene Other (See Comments)    Hallucinations    Outpatient Encounter Medications as of 07/16/2021  Medication Sig   acetaminophen (TYLENOL) 325 MG tablet Take 325-650 mg by mouth every 6 (six) hours as needed for mild pain.   AMBULATORY NON FORMULARY MEDICATION U step walker  Dx: G20 (Patient taking differently: See admin instructions. U step walker- Dx: G20)   AMBULATORY NON FORMULARY MEDICATION Abdominal compression binder Dx: G20   carbidopa-levodopa (SINEMET CR) 50-200 MG tablet TAKE 1 TABLET BY MOUTH EVERYDAY AT BEDTIME (Patient taking differently: Take 1 tablet by mouth at bedtime.)    carbidopa-levodopa (SINEMET IR) 25-100 MG tablet TAKE 1.5 TABLETS BY MOUTH 5 (FIVE) TIMES DAILY. 6am/9am/noon/3pm/5pm (Patient taking differently: Take 1.5 tablets by mouth See admin instructions. Take 1.5 tablets by mouth at 6 AM, 9 AM, 12 NOON, 3 PM, and 6 PM)   clonazePAM (KLONOPIN) 0.5 MG tablet Take 1 tablet (0.5 mg total) by mouth 3 (three) times daily as needed for anxiety.   ibandronate (BONIVA) 150 MG tablet Take 150 mg by mouth every 30 (thirty) days.   levothyroxine (SYNTHROID, LEVOTHROID) 50 MCG tablet Take 50 mcg by mouth daily before breakfast.   NUPLAZID 34 MG CAPS Take 1 capsule by mouth daily.   rivastigmine (EXELON) 4.6 mg/24hr 4.6 mg daily.   sertraline (ZOLOFT) 100 MG tablet TAKE 1 TABLET BY MOUTH EVERY DAY   No facility-administered encounter medications on file as of 07/16/2021.    Patient Active Problem List   Diagnosis Date Noted   Vitamin D deficiency 07/10/2019   Vitamin B12 deficiency 07/10/2019   Hypothyroidism    Frequent falls    Osteoporosis    Closed fracture of right distal radius 12/25/2018   Parkinson disease (HCC) 10/20/2011    Conditions to be addressed/monitored:Osteoporosis and Parkinson's disease, frequent falls  Care Plan : RN Care Manager Plan of Care  Updates made by Yetta Glassman, RN since 07/16/2021 12:00 AM     Problem: Chronic Disease Management and Care Coordination Needs(Parkinson Disease, Osteoporosis, Frequent Falls)   Priority: High     Long-Range Goal: Establish Plan of Care for Chronic Disease Management Needs (Parkinson Disease, Osteoporosis, Frequent Falls)   Start Date: 04/12/2021  Expected End Date: 10/09/2021  Recent Progress: On track  Priority: High  Note:   Current Barriers:  Chronic Disease Management support and education needs related to Osteoporosis and Parkinson Disease, Frequent Falls Cognitive Deficits Spoke with pt and husband Jomarie Longs.  States pt saw neurology on 06/30/21. States she has been wearing the  Exelon patch.  States pt has been stronger with less confusion/hallucinations and anxiety since she has been taking the Nuplazid.  States she has been eating and sleeping better. States she did have a fall today when she was trying to lift up a mixer. Denies any injury. States her urinary frequency has improved   States he has been trying to give her foods with more calories and protein.    States she is using the walker more.   Denies and choking or difficulty swallowing. States she is planning on going to exercise for people with Parkinson's and she has been riding her exercise bike 10-15 minutes several days a week RNCM Clinical Goal(s):  Patient will verbalize understanding of plan for management of Osteoporosis and Parkinson Disease, Frequent Falls as evidenced by voiced adherence to plan of care verbalize basic understanding of  Osteoporosis and Parkinson Disease, Frequent Falls disease process and self health management plan as evidenced by voiced understanding and teach back take all medications exactly as prescribed and will call provider for medication related questions as evidenced by dispense report and pt verbalization attend all scheduled medical appointments: Neurology 12/28/21 as evidenced by medical records demonstrate Improved adherence to prescribed treatment plan for Osteoporosis and Parkinson Disease, Frequent Falls as evidenced by reports fewer falls, adherence to plan of care continue to work with RN Care Manager to address care management and care coordination needs related to  Osteoporosis and Parkinson Disease, Frequent Falls as evidenced by adherence to CM Team Scheduled appointments through collaboration with RN Care manager, provider, and care team.   Interventions: 1:1 collaboration with primary care provider regarding development and update of comprehensive plan of care as evidenced by provider attestation and co-signature Inter-disciplinary care team collaboration (see  longitudinal plan of care) Evaluation of current treatment plan related to  self management and patient's adherence to plan as established by provider   Parkinson Disease Goal on track:  Yes Long Term Goal Evaluation of current treatment plan related to  Parkinson Disease,  , ADL IADL limitations and Cognitive Deficits self-management and patient's adherence to plan as established by provider. Discussed plans with patient for ongoing care management follow up and provided patient with direct contact information for care management team Evaluation of current treatment plan related to Parkinson Disease, and patient's adherence to plan as established by provider Provided education to patient re: Parkinson Disease,and to discuss sinus and secretion issues with neurologist Discussed plans with patient for ongoing care management follow up and provided patient with direct contact information for care management team Reinforced importance of good nutrition and to consider using nutritional supplements to help pt maintain or gain weight.  Reinforced to try to get back to going to gym for regular exercise   Falls Interventions:  (Status:  Goal on track:  NO.) Long Term Goal Reviewed medications and discussed potential side effects of medications such as dizziness and frequent urination Advised patient of importance of notifying provider of falls Assessed for falls since last encounter Assessed patients knowledge of fall risk prevention secondary to previously provided education Reviewed to avoid lifting and bending due to her balance and to ask a family member for help   Osteoporosis  (  Status:  New goal.)  Long Term Goal Evaluation of current treatment plan related to Osteoporosis, ADL IADL limitations, Memory Deficits, Inability to perform ADL's independently, and Inability to perform IADL's independently self-management and patient's adherence to plan as established by provider. Discussed plans with  patient for ongoing care management follow up and provided patient with direct contact information for care management team Evaluation of current treatment plan related to Osteoporosis and patient's adherence to plan as established by provider Provided education to patient re: Osteoporosis Reviewed medications with patient and discussed use of Boniva Discussed plans with patient for ongoing care management follow up and provided patient with direct contact information for care management team Reviewed to try to include resistance training with her exercise      Patient Goals/Self-Care Activities: Take all medications as prescribed Attend all scheduled provider appointments Call provider office for new concerns or questions  - always use handrails on the stairs - always wear shoes or slippers with non-slip sole - get at least 10 minutes of activity every day - install bathroom grab bars - keep a flashlight by the bed - keep cell phone with me always - make an emergency alert plan in case I fall - pick up clutter from the floors - remove, or use a non-slip pad, with my throw rugs - use a cane or walker - use a nightlight in the bathroom Follow Up Plan:  Telephone follow up appointment with care management team member scheduled for:  08/13/21 The patient has been provided with contact information for the care management team and has been advised to call with any health related questions or concerns.         Plan:Telephone follow up appointment with care management team member scheduled for:  08/13/21 The patient has been provided with contact information for the care management team and has been advised to call with any health related questions or concerns.  Dudley Major RN, Maximiano Coss, CDE Care Management Coordinator [facility] [phone]

## 2021-07-16 NOTE — Telephone Encounter (Signed)
Finished Vitamin D prescription--pt's husband wants to know what to do next?  They are wanting a call back.

## 2021-07-19 NOTE — Telephone Encounter (Signed)
Lab scheduled °

## 2021-07-20 ENCOUNTER — Other Ambulatory Visit: Payer: Self-pay | Admitting: Internal Medicine

## 2021-07-20 ENCOUNTER — Other Ambulatory Visit (INDEPENDENT_AMBULATORY_CARE_PROVIDER_SITE_OTHER): Payer: Medicare PPO

## 2021-07-20 DIAGNOSIS — E559 Vitamin D deficiency, unspecified: Secondary | ICD-10-CM

## 2021-07-20 LAB — VITAMIN D 25 HYDROXY (VIT D DEFICIENCY, FRACTURES): VITD: 50.92 ng/mL (ref 30.00–100.00)

## 2021-07-27 DIAGNOSIS — E039 Hypothyroidism, unspecified: Secondary | ICD-10-CM | POA: Diagnosis not present

## 2021-07-27 DIAGNOSIS — M810 Age-related osteoporosis without current pathological fracture: Secondary | ICD-10-CM

## 2021-08-10 ENCOUNTER — Telehealth: Payer: Self-pay | Admitting: Internal Medicine

## 2021-08-10 ENCOUNTER — Encounter: Payer: Self-pay | Admitting: Internal Medicine

## 2021-08-10 ENCOUNTER — Ambulatory Visit (INDEPENDENT_AMBULATORY_CARE_PROVIDER_SITE_OTHER): Payer: Medicare PPO | Admitting: Internal Medicine

## 2021-08-10 VITALS — BP 102/68 | HR 90 | Temp 98.3°F | Ht 65.5 in | Wt 117.7 lb

## 2021-08-10 DIAGNOSIS — E039 Hypothyroidism, unspecified: Secondary | ICD-10-CM

## 2021-08-10 DIAGNOSIS — M810 Age-related osteoporosis without current pathological fracture: Secondary | ICD-10-CM | POA: Diagnosis not present

## 2021-08-10 DIAGNOSIS — E559 Vitamin D deficiency, unspecified: Secondary | ICD-10-CM | POA: Diagnosis not present

## 2021-08-10 DIAGNOSIS — Z13820 Encounter for screening for osteoporosis: Secondary | ICD-10-CM

## 2021-08-10 DIAGNOSIS — E538 Deficiency of other specified B group vitamins: Secondary | ICD-10-CM

## 2021-08-10 DIAGNOSIS — Z0000 Encounter for general adult medical examination without abnormal findings: Secondary | ICD-10-CM | POA: Diagnosis not present

## 2021-08-10 DIAGNOSIS — R296 Repeated falls: Secondary | ICD-10-CM

## 2021-08-10 DIAGNOSIS — G20 Parkinson's disease: Secondary | ICD-10-CM

## 2021-08-10 NOTE — Progress Notes (Signed)
? ? ? ?Established Patient Office Visit ? ? ? ? ?This visit occurred during the SARS-CoV-2 public health emergency.  Safety protocols were in place, including screening questions prior to the visit, additional usage of staff PPE, and extensive cleaning of exam room while observing appropriate contact time as indicated for disinfecting solutions.  ? ? ?CC/Reason for Visit: Annual preventive exam and subsequent Medicare wellness visit ? ?HPI: Kelly GroutDeborah Lowman Howard is a 71 y.o. female who is coming in today for the above mentioned reasons. Past Medical History is significant for: Parkinson's disease with frequent falls [facility], generalized anxiety disorder and panic attacks followed by psychiatry, vitamin D and B12 deficiencies, osteoporosis, hypothyroidism.  She is here with her husband as usual.  Since I last saw them they have moved to a house that is 1 level.  She has had a lot of trouble adjusting to this new house and has had lots of hallucinations.  Her mother who was [id number] is in the last stages of her life.  This has created a lot of anxiety over the past week.  She is overdue for her COVID booster.  She is overdue for DEXA scan.  She follows with GYN routinely and does mammograms and Pap smears through them.  She had a colonoscopy in 2019.  They are wanting to resume her physical therapy and speech therapy. ? ? ?Past Medical/Surgical History: ?Past Medical History:  ?Diagnosis Date  ? Frequent falls   ? Hypothyroidism   ? Osteoporosis   ? Parkinson's disease (HCC)   ? ? ?Past Surgical History:  ?Procedure Laterality Date  ? TONSILLECTOMY    ? WRIST SURGERY    ? ? ?Social History: ? reports that she has never smoked. She has been exposed to tobacco smoke. She has never used smokeless tobacco. She reports current alcohol use. She reports that she does not use drugs. ? ?Allergies: ?Allergies  ?Allergen Reactions  ? Codeine Other (See Comments)  ?  GI Upset  ? Escitalopram Swelling and  Other (See Comments)  ?  Feet swell  ? Propoxyphene Other (See Comments)  ?  Hallucinations  ? ? ?Family History:  ?Family History  ?Problem Relation Age of Onset  ? Hypercalcemia Mother   ? Cancer Paternal Grandmother   ? Diabetes Paternal Grandfather   ? ? ? ?Current Outpatient Medications:  ?  acetaminophen (TYLENOL) 325 MG tablet, Take 325-650 mg by mouth every 6 (six) hours as needed for mild pain., Disp: , Rfl:  ?  AMBULATORY NON FORMULARY MEDICATION, Abdominal compression binder Dx: G20, Disp: 1 Device, Rfl: 0 ?  carbidopa-levodopa (SINEMET CR) 50-200 MG tablet, TAKE 1 TABLET BY MOUTH EVERYDAY AT BEDTIME (Patient taking differently: Take 1 tablet by mouth at bedtime.), Disp: 90 tablet, Rfl: 1 ?  carbidopa-levodopa (SINEMET IR) 25-100 MG tablet, TAKE 1.5 TABLETS BY MOUTH 5 (FIVE) TIMES DAILY. 6am/9am/noon/3pm/5pm (Patient taking differently: Take 1.5 tablets by mouth See admin instructions. Take 1.5 tablets by mouth at 6 AM, 9 AM, 12 NOON, 3 PM, and 6 PM), Disp: 675 tablet, Rfl: 1 ?  clonazePAM (KLONOPIN) 0.5 MG tablet, Take 1 tablet (0.5 mg total) by mouth 3 (three) times daily as needed for anxiety., Disp: 90 tablet, Rfl: 2 ?  ibandronate (BONIVA) 150 MG tablet, Take 150 mg by mouth every 30 (thirty) days., Disp: , Rfl:  ?  levothyroxine (SYNTHROID, LEVOTHROID) 50 MCG tablet, Take 50 mcg by mouth daily before breakfast., Disp: , Rfl:  ?  NUPLAZID 34 MG CAPS, Take 1 capsule by mouth daily., Disp: , Rfl:  ?  rivastigmine (EXELON) 4.6 mg/24hr, 4.6 mg daily., Disp: , Rfl:  ?  sertraline (ZOLOFT) 100 MG tablet, TAKE 1 TABLET BY MOUTH EVERY DAY, Disp: 90 tablet, Rfl: 3 ? ?Review of Systems:  ?Constitutional: Denies fever, chills, diaphoresis, appetite change. ?HEENT: Denies photophobia, eye pain, redness, hearing loss, ear pain, congestion, sore throat, rhinorrhea, sneezing, mouth sores, neck pain, neck stiffness and tinnitus.   ?Respiratory: Denies SOB, DOE, cough, chest tightness,  and wheezing.    ?Cardiovascular: Denies chest pain, palpitations and leg swelling.  ?Gastrointestinal: Denies nausea, vomiting, abdominal pain, diarrhea, constipation, blood in stool and abdominal distention.  ?Genitourinary: Denies dysuria, urgency, frequency, hematuria, flank pain and difficulty urinating.  ?Endocrine: Denies: hot or cold intolerance, sweats, changes in hair or nails, polyuria, polydipsia. ?Musculoskeletal: Denies myalgias, back pain, joint swelling, arthralgias and gait problem.  ?Skin: Denies pallor, rash and wound.  ?Neurological: Denies dizziness, seizures, syncope,  light-headedness, numbness and headaches.  ?Hematological: Denies adenopathy. Easy bruising, personal or family bleeding history  ?Psychiatric/Behavioral: Denies suicidal ideation, mood changes, confusion, nervousness, sleep disturbance and agitation ? ? ? ?Physical Exam: ?Vitals:  ? 08/10/21 1302  ?BP: 102/68  ?Pulse: 90  ?Temp: 98.3 ?F (36.8 ?C)  ?TempSrc: Oral  ?SpO2: 98%  ?Weight: 117 lb 11.2 oz (53.4 kg)  ?Height: 5' 5.5" (1.664 m)  ? ? ?Body mass index is 19.29 kg/m?. ? ? ?Constitutional: NAD, calm, comfortable ?Eyes: PERRL, lids and conjunctivae normal ?ENMT: Mucous membranes are moist. Posterior pharynx clear of any exudate or lesions. Normal dentition. Tympanic membrane is pearly white, no erythema or bulging. ?Neck: normal, supple, no masses, no thyromegaly ?Respiratory: clear to auscultation bilaterally, no wheezing, no crackles. Normal respiratory effort. No accessory muscle use.  ?Cardiovascular: Regular rate and rhythm, no murmurs / rubs / gallops. No extremity edema. 2+ pedal pulses. No carotid bruits.  ?Abdomen: no tenderness, no masses palpated. No hepatosplenomegaly. Bowel sounds positive.  ?Musculoskeletal: no clubbing / cyanosis. No joint deformity upper and lower extremities. Good ROM, no contractures. Normal muscle tone.  ?Skin: no rashes, lesions, ulcers. No induration ? ? ?Subsequent Medicare wellness visit ?  ?1. Risk  factors, based on past  M,S,F -cardiovascular review refractory to include age ?  ?2.  Physical activities: Only activities of daily living ?  ?3.  Depression/mood: Mood is depressed ?  ?4.  Hearing: No perceived issues ?  ?5.  ADL's: Dependent on most ADLs ?  ?6.  Fall risk: High fall risk ?  ?7.  Home safety: No problems identified ?  ?8.  Height weight, and visual acuity: height and weight as above, vision: ? ?Vision Screening - Comments:: Patient declined ?  ?9.  Counseling: Advised she update her vaccination status ?  ?10. Lab orders based on risk factors: Laboratory update will be reviewed ?  ?11. Referral : For DEXA scan ?  ?12. Care plan: Follow-up with me in 1 year ?  ?13. Cognitive assessment: Mild to moderate cognitive impairment ?  ?14. Screening: Patient provided with a written and personalized 5-10 year screening schedule in the AVS. yes ?  ?15. Provider List Update: PCP, psychiatry, neurology,, gynecology ? ?16. Advance Directives: Full code ? ? ?17. Opioids: Patient is not on any opioid prescriptions and has no risk factors for a substance use disorder. ? ? ?Flowsheet Row Office Visit from 04/19/2021 in [facility]  ?PHQ-9 Total Score 19  ? ?  ? ? ?  Fall Risk 12/18/2020 01/06/2021 01/07/2021 02/12/2021 07/16/2021  ?Falls in the past year? 1 - - 1 1  ?Number of falls in past year rt shoulder - - - numerous falls  ?Was there an injury with Fall? 1 - - 1 1  ?Was there an injury with Fall? - - - - -  ?Fall Risk Category Calculator 3 - - 3 3  ?Fall Risk Category High - - High High  ?Patient Fall Risk Level High fall risk Low fall risk High fall risk High fall risk High fall risk  ?Patient at Risk for Falls Due to History of fall(s);Impaired balance/gait;Impaired mobility;Medication side effect - - History of fall(s);Impaired balance/gait;Impaired mobility;Medication side effect History of fall(s);Impaired balance/gait;Impaired mobility  ?Fall risk Follow up Education provided;Falls  prevention discussed - - Falls evaluation completed;Education provided;Falls prevention discussed Falls prevention discussed  ? ? ? ?Impression and Plan: ? ?Encounter for preventive health examination ?-Recommend routine eye

## 2021-08-10 NOTE — Telephone Encounter (Signed)
Patient spouse brought in paperwork that he needs Dr.Hernandez to complete for the patient. Paperwork would be placed in the folder. ? ?Patient could be contacted at [phone] when paperwork is complete. ? ?Please advise. ?

## 2021-08-10 NOTE — Addendum Note (Signed)
Addended by: Kern Reap B on: 08/10/2021 04:46 PM ? ? Modules accepted: Orders ? ?

## 2021-08-10 NOTE — Patient Instructions (Signed)
-  Nice seeing you today!! ? ?-Lab work today; will notify you once results are available. ? ?-Remember your COVID booster. ? ?-See you back in 1 year or sooner as needed. ?

## 2021-08-11 NOTE — Telephone Encounter (Signed)
Referrals placed 

## 2021-08-13 ENCOUNTER — Ambulatory Visit (INDEPENDENT_AMBULATORY_CARE_PROVIDER_SITE_OTHER): Payer: Medicare PPO

## 2021-08-13 DIAGNOSIS — R296 Repeated falls: Secondary | ICD-10-CM

## 2021-08-13 DIAGNOSIS — G20 Parkinson's disease: Secondary | ICD-10-CM

## 2021-08-13 DIAGNOSIS — M810 Age-related osteoporosis without current pathological fracture: Secondary | ICD-10-CM

## 2021-08-13 DIAGNOSIS — E559 Vitamin D deficiency, unspecified: Secondary | ICD-10-CM

## 2021-08-13 NOTE — Patient Instructions (Signed)
Visit Information ? ?Thank you for taking time to visit with me today. Please don't hesitate to contact me if I can be of assistance to you before our next scheduled telephone appointment. ? ?Following are the goals we discussed today:  ?Take all medications as prescribed ?Attend all scheduled provider appointments ?Call provider office for new concerns or questions  ?always use handrails on the stairs ?- always wear shoes or slippers with non-slip sole ?- get at least 10 minutes of activity every day ?- install bathroom grab bars ?- keep a flashlight by the bed ?- keep cell phone with me always ?- make an emergency alert plan in case I fall ?- pick up clutter from the floors ?- remove, or use a non-slip pad, with my throw rugs ?- use a cane or walker ?- use a nightlight in the bathroom ? ?Our next appointment is by telephone on 10/29/21 at 10:45 AM ? ?Please call the care guide team at [phone] if you need to cancel or reschedule your appointment.  ? ?If you are experiencing a Mental Health or Behavioral Health Crisis or need someone to talk to, please call the Suicide and Crisis Lifeline: 988 ?call the [place] National Suicide Prevention Lifeline: (236)111-5059 or TTY: 219-623-9462 TTY 712-018-7452) to talk to a trained counselor ?call 1-800-273-TALK (toll free, 24 hour hotline) ?go to [facility] [address], [place] [phone]) ?call 911  ? ?Patient verbalizes understanding of instructions and care plan provided today and agrees to view in MyChart. Active MyChart status confirmed with patient.   ?Dudley Major RN, BSN,CCM, CDE ?Care Management Coordinator ?[facility] ?[phone]   ?

## 2021-08-13 NOTE — Chronic Care Management (AMB) (Signed)
?Chronic Care Management  ? ?CCM RN Visit Note ? ?08/13/2021 ?Name: Kelly Howard MRN: [id number] DOB: 1951-03-04 ? ?Subjective: ?Kelly Howard is a 71 y.o. year old female who is a primary care patient of Philip Aspen, Limmie Patricia, MD. The care management team was consulted for assistance with disease management and care coordination needs.   ? ?Engaged with patient by telephone for follow up visit in response to provider referral for case management and/or care coordination services.  ? ?Consent to Services:  ?The patient was given information about Chronic Care Management services, agreed to services, and gave verbal consent prior to initiation of services.  Please see initial visit note for detailed documentation.  ? ?Patient agreed to services and verbal consent obtained.  ? ?Assessment: Review of patient past medical history, allergies, medications, health status, including review of consultants reports, laboratory and other test data, was performed as part of comprehensive evaluation and provision of chronic care management services.  ? ?SDOH (Social Determinants of Health) assessments and interventions performed:   ? ?CCM Care Plan ? ?Allergies  ?Allergen Reactions  ? Codeine Other (See Comments)  ?  GI Upset  ? Escitalopram Swelling and Other (See Comments)  ?  Feet swell  ? Propoxyphene Other (See Comments)  ?  Hallucinations  ? ? ?Outpatient Encounter Medications as of 08/13/2021  ?Medication Sig  ? acetaminophen (TYLENOL) 325 MG tablet Take 325-650 mg by mouth every 6 (six) hours as needed for mild pain.  ? AMBULATORY NON FORMULARY MEDICATION Abdominal compression binder ?Dx: G20  ? carbidopa-levodopa (SINEMET CR) 50-200 MG tablet TAKE 1 TABLET BY MOUTH EVERYDAY AT BEDTIME (Patient taking differently: Take 1 tablet by mouth at bedtime.)  ? carbidopa-levodopa (SINEMET IR) 25-100 MG tablet TAKE 1.5 TABLETS BY MOUTH 5 (FIVE) TIMES DAILY. 6am/9am/noon/3pm/5pm (Patient taking differently: Take  1.5 tablets by mouth See admin instructions. Take 1.5 tablets by mouth at 6 AM, 9 AM, 12 NOON, 3 PM, and 6 PM)  ? clonazePAM (KLONOPIN) 0.5 MG tablet Take 1 tablet (0.5 mg total) by mouth 3 (three) times daily as needed for anxiety.  ? ibandronate (BONIVA) 150 MG tablet Take 150 mg by mouth every 30 (thirty) days.  ? levothyroxine (SYNTHROID, LEVOTHROID) 50 MCG tablet Take 50 mcg by mouth daily before breakfast.  ? NUPLAZID 34 MG CAPS Take 1 capsule by mouth daily.  ? rivastigmine (EXELON) 4.6 mg/24hr 4.6 mg daily.  ? sertraline (ZOLOFT) 100 MG tablet TAKE 1 TABLET BY MOUTH EVERY DAY  ? ?No facility-administered encounter medications on file as of 08/13/2021.  ? ? ?Patient Active Problem List  ? Diagnosis Date Noted  ? Vitamin D deficiency 07/10/2019  ? Vitamin B12 deficiency 07/10/2019  ? Hypothyroidism   ? Frequent falls   ? Osteoporosis   ? Closed fracture of right distal radius 12/25/2018  ? Parkinson disease (HCC) 10/20/2011  ? ? ?Conditions to be addressed/monitored:Osteoporosis and Parkinson's Disease, frequent falls ? ?Care Plan : RN Care Manager Plan of Care  ?Updates made by Yetta Glassman, RN since 08/13/2021 12:00 AM  ?  ? ?Problem: Chronic Disease Management and Care Coordination Needs(Parkinson Disease, Osteoporosis, Frequent Falls)   ?Priority: High  ?  ? ?Long-Range Goal: Establish Plan of Care for Chronic Disease Management Needs (Parkinson Disease, Osteoporosis, Frequent Falls)   ?Start Date: 04/12/2021  ?Expected End Date: 10/09/2021  ?Recent Progress: On track  ?Priority: High  ?Note:   ?Current Barriers:  ?Chronic Disease Management support and education needs related to  Osteoporosis and Parkinson Disease, Frequent Falls ?Cognitive Deficits ?States pt saw neurology on 06/30/21. States she has been wearing the Exelon patch.  States pt has been stronger with less confusion/hallucinations and anxiety since she has been taking the Nuplazid.  States she has been eating and sleeping better. States she  did have a fall today when she was trying to lift up a mixer. Denies any injury. States her urinary frequency has improved   States he has been trying to give her foods with more calories and protein.    States she is using the walker more.   Denies and choking or difficulty swallowing. States she is planning on going to exercise for people with Parkinson's and she has been riding her exercise bike 10-15 minutes several days a week ?08/13/21 Spoke with husband Jomarie LongsJoseph. States that pts Mother passed away yesterday at [age] yrs old.  States Eunice BlaseDebbie is handling it as well as can be expected.  States she saw Dr Ardyth HarpsHernandez last week and had a good check up.  STates  she has been eating good and feeling good. No recent falls reported. States she is to start PT again and will go on 08/20/21 for evaluation. ?RNCM Clinical Goal(s):  ?Patient will verbalize understanding of plan for management of Osteoporosis and Parkinson Disease, Frequent Falls as evidenced by voiced adherence to plan of care ?verbalize basic understanding of  Osteoporosis and Parkinson Disease, Frequent Falls disease process and self health management plan as evidenced by voiced understanding and teach back ?take all medications exactly as prescribed and will call provider for medication related questions as evidenced by dispense report and pt verbalization ?attend all scheduled medical appointments: Neuro PT eval 3/24/23Neurology 12/28/21 as evidenced by medical records ?demonstrate Improved adherence to prescribed treatment plan for Osteoporosis and Parkinson Disease, Frequent Falls as evidenced by reports fewer falls, adherence to plan of care ?continue to work with RN Care Manager to address care management and care coordination needs related to  Osteoporosis and Parkinson Disease, Frequent Falls as evidenced by adherence to CM Team Scheduled appointments through collaboration with RN Care manager, provider, and care team.  ? ?Interventions: ?1:1 collaboration  with primary care provider regarding development and update of comprehensive plan of care as evidenced by provider attestation and co-signature ?Inter-disciplinary care team collaboration (see longitudinal plan of care) ?Evaluation of current treatment plan related to  self management and patient's adherence to plan as established by provider ? ? ?Parkinson Disease Goal on track:  Yes Long Term Goal ?Evaluation of current treatment plan related to  Parkinson Disease,  , ADL IADL limitations and Cognitive Deficits self-management and patient's adherence to plan as established by provider. ?Discussed plans with patient for ongoing care management follow up and provided patient with direct contact information for care management team ?Evaluation of current treatment plan related to Parkinson Disease, and patient's adherence to plan as established by provider ?Provided education to patient re: Parkinson Disease,and to discuss sinus and secretion issues with neurologist ?Discussed plans with patient for ongoing care management follow up and provided patient with direct contact information for care management team ?Reinforced importance of good nutrition and to consider using nutritional supplements to help pt maintain or gain weight.  Reinforced to try to get back to going to gym for regular exercise ? ? ?Falls Interventions:  (Status:  Goal on track:  Yes.) Long Term Goal ?Reviewed medications and discussed potential side effects of medications such as dizziness and frequent urination ?Advised patient of importance  of notifying provider of falls ?Assessed for falls since last encounter ?Assessed patients knowledge of fall risk prevention secondary to previously provided education ?Reviewed to keep appt for neuro PT eval to start PT ? ? ?Osteoporosis  (Status:  Goal on track:  Yes.)  Long Term Goal ?Evaluation of current treatment plan related to Osteoporosis, ADL IADL limitations, Memory Deficits, Inability to perform  ADL's independently, and Inability to perform IADL's independently self-management and patient's adherence to plan as established by provider. ?Discussed plans with patient for ongoing care management fo

## 2021-08-17 DIAGNOSIS — R350 Frequency of micturition: Secondary | ICD-10-CM | POA: Diagnosis not present

## 2021-08-19 NOTE — Therapy (Signed)
[place] ?[facility] ?[address], [address] ?[place], [place], [address] ?Phone: [phone]   Fax:  [phone] ? ?Patient Details  ?Name: Kelly Howard ?MRN: [id number] ?Date of Birth: 10/28/1950 ?Referring Provider: Hyman Bower, MD (current Parkinson's MD) ? ?Encounter Date: 08/19/2021 ? ?SPEECH THERAPY DISCHARGE SUMMARY ? ?Visits from Start of Care: <10, in February 2021 ? ?Current functional level related to goals / functional outcomes: ?Limited progress due to pt anxiety levels. See note below from March 2021. Pt was not formally d/c'd from [facility] in April 2021. This note is to formally d/c pt from skilled ST in 2021. ? ? ?Name: Kelly Howard ?MRN: [id number] ?Date of Birth: Apr 08, 1951 ?Referring Provider:  Dr. Linus Mako ?  ?Encounter Date: 08/20/2019 ?  ?SLP did chart review to assess if pt's behavior in other therapies since ST put on hold late last month to assess whether or not to place pt off of "hold" status and back to "active" status. ?Pt with limited (3) therapy visits in the past 4 weeks, with anxious/internally distracting behaviors noted, and it being necessary for pt to spend time away from other activity in the gym and engage in focused relaxation tasks in order to allow active participation in PT.  ?SLP believes it is best to cont to have pt remin on "hold ST" status. Pt has 4 therapy sessions scheduled between today's date and 09-03-19. SLP will examine pt's chart approx 09-04-19 to re-assess pt appropriateness to cont skilled ST. If at that time pt remains inappropriate due to behavioral challenges/attention, SLP will d/c pt, and suggest a script be sent when pt's anxious behaviors and attention are at a level to participate and benefit from skilled [facility].  ?  ?[facility] ,[place], [place] ?  ?08/20/2019, 5:07 PM ?  ?Remaining deficits: ?Assumed, deficits remain. ?  ?Education / Equipment: ?Compensatory measures.  ? ?Patient agrees to discharge. Patient goals were not  met. Patient is being discharged due to lack of progress.. ? ? ? ? ? ?Cadey Bazile, CCC-SLP ?08/19/2021, 11:31 AM ? ?[place] ?[facility] ?[address], [address] ?[place], [place], [address] ?Phone: [phone]   Fax:  [phone] ?

## 2021-08-20 ENCOUNTER — Other Ambulatory Visit: Payer: Self-pay

## 2021-08-20 ENCOUNTER — Ambulatory Visit: Payer: Medicare PPO | Attending: Internal Medicine | Admitting: Physical Therapy

## 2021-08-20 ENCOUNTER — Encounter: Payer: Self-pay | Admitting: Physical Therapy

## 2021-08-20 ENCOUNTER — Ambulatory Visit: Payer: Medicare PPO

## 2021-08-20 DIAGNOSIS — R41841 Cognitive communication deficit: Secondary | ICD-10-CM

## 2021-08-20 DIAGNOSIS — M6281 Muscle weakness (generalized): Secondary | ICD-10-CM | POA: Insufficient documentation

## 2021-08-20 DIAGNOSIS — R296 Repeated falls: Secondary | ICD-10-CM | POA: Diagnosis not present

## 2021-08-20 DIAGNOSIS — R2689 Other abnormalities of gait and mobility: Secondary | ICD-10-CM | POA: Insufficient documentation

## 2021-08-20 DIAGNOSIS — R29818 Other symptoms and signs involving the nervous system: Secondary | ICD-10-CM | POA: Insufficient documentation

## 2021-08-20 DIAGNOSIS — R293 Abnormal posture: Secondary | ICD-10-CM | POA: Diagnosis not present

## 2021-08-20 DIAGNOSIS — R1310 Dysphagia, unspecified: Secondary | ICD-10-CM

## 2021-08-20 DIAGNOSIS — G20 Parkinson's disease: Secondary | ICD-10-CM | POA: Insufficient documentation

## 2021-08-20 DIAGNOSIS — R2681 Unsteadiness on feet: Secondary | ICD-10-CM | POA: Insufficient documentation

## 2021-08-20 DIAGNOSIS — R471 Dysarthria and anarthria: Secondary | ICD-10-CM | POA: Diagnosis not present

## 2021-08-20 NOTE — Therapy (Signed)
[place] ?[facility] ?[address], [address] ?[place], [place], [address] ?Phone: [phone]   Fax:  [phone] ? ?Physical Therapy Evaluation ? ?Patient Details  ?Name: Kelly Howard ?MRN: [id number] ?Date of Birth: [birth year] ?Referring Provider (PT): Philip Aspen, MD ? ? ?Encounter Date: 08/20/2021 ? ? PT End of Session - 08/20/21 1216   ? ? Visit Number 1   ? Number of Visits 17   ? Date for PT Re-Evaluation 10/15/21   ? Authorization Type [employer]   ? Progress Note Due on Visit 10   ? PT Start Time [phone]   ? PT Stop Time 0847   ? PT Time Calculation (min) 44 min   ? Activity Tolerance Patient tolerated treatment well   ? Behavior During Therapy Restless;Flat affect   ? ?  ?  ? ?  ? ? ?Past Medical History:  ?Diagnosis Date  ? Frequent falls   ? Hypothyroidism   ? Osteoporosis   ? Parkinson's disease (HCC)   ? ? ?Past Surgical History:  ?Procedure Laterality Date  ? TONSILLECTOMY    ? WRIST SURGERY    ? ? ?There were no vitals filed for this visit. ? ? ? Subjective Assessment - 08/20/21 0754   ? ? Subjective No having success with this disease.  Moved in the past several years to a one level home.   Husband reports some confusion and memory loss.  Have recumbent bike at home that she rides weekly.  She reports about 10 falls in the past 6 months.  Fear of falling is worse.  When falling, she falls forwards, stumbles over things.   ? Patient is accompained by: Family member   Husband-provides a lot of history  ? Pertinent History PMH:  GAD, panic attacks, osteoporosis, hypothyroidism, hx of UE fractures (R and L)   ? Patient Stated Goals Whatever we can do to help get better.  Would like to get back into exercises.   ? Currently in Pain? No/denies   ? ?  ?  ? ?  ? ? ? ? ? [facility] PT Assessment - 08/20/21 0813   ? ?  ? Assessment  ? Medical Diagnosis Parkinson's disease   ? Referring Provider (PT) Philip Aspen, MD   ? Onset Date/Surgical Date 08/10/21   MD order  ?  Prior Therapy 2 years ago at [facility]   ?  ? Precautions  ? Precautions Fall   Osteoporosis, hx of fractures  ?  ? Balance Screen  ? Has the patient fallen in the past 6 months Yes   ? How many times? 10   ? Has the patient had a decrease in activity level because of a fear of falling?  Yes   ? Is the patient reluctant to leave their home because of a fear of falling?  Yes   ?  ? Home Environment  ? Living Environment Private residence   ? Living Arrangements Spouse/significant other   ? Available Help at Discharge Family   ? Type of Home House   ? Home Layout Two level;Able to live on main level with bedroom/bathroom   ? Home Equipment Walker - 4 wheels;Walker - 2 wheels   Rollator  ?  ? Prior Function  ? Level of Independence Independent   ? Vocation Retired   ? Leisure Enjoys exercises, but hasn't partipated in several years   ?  ? Cognition  ? Overall Cognitive Status Impaired/Different from baseline   ? Memory  Impaired   Husband reports increased confusion/memory deficits; she stops mid-sentence at times with history taking  ? Behaviors Restless   ?  ? Observation/Other Assessments  ? Focus on Therapeutic Outcomes (FOTO)  NA   ?  ? Posture/Postural Control  ? Posture/Postural Control Postural limitations   ? Postural Limitations Rounded Shoulders;Forward head;Flexed trunk   ? Posture Comments Extreme forward flexed head/neck posture, rotation towards L   ?  ? ROM / Strength  ? AROM / PROM / Strength AROM;Strength   ?  ? AROM  ? Overall AROM  Within functional limits for tasks performed   ?  ? Strength  ? Overall Strength Deficits   ? Overall Strength Comments Grossly tested 4/5 BLES   ?  ? Transfers  ? Transfers Sit to Stand;Stand to Sit   ? Sit to Stand 5: Supervision;Without upper extremity assist;From chair/3-in-1;4: Min guard   ? Five time sit to stand comments  12.43   ? Stand to Sit 5: Supervision;4: Min guard;Without upper extremity assist;To chair/3-in-1   ?  ? Ambulation/Gait  ? Ambulation/Gait Yes    ? Ambulation/Gait Assistance 5: Supervision;4: Min guard   ? Assistive device None   ? Gait Pattern Step-through pattern;Decreased arm swing - right;Decreased arm swing - left;Scissoring;Decreased dorsiflexion - right;Decreased dorsiflexion - left;Narrow base of support;Poor foot clearance - left;Poor foot clearance - right   ? Ambulation Surface Level;Outdoor   ? Gait velocity 16.97 sec =1.93 ft/sec   ?  ? Standardized Balance Assessment  ? Standardized Balance Assessment Timed Up and Go Test;Mini-BESTest   ?  ? Mini-BESTest  ? Sit To Stand Normal: Comes to stand without use of hands and stabilizes independently.   ? Rise to Toes < 3 s.   ? Stand on one leg (left) Moderate: < 20 s   8.62, 9.38  ? Stand on one leg (right) Moderate: < 20 s   4.66, 11.43  ? Stand on one leg - lowest score 1   ? Compensatory Stepping Correction - Forward Normal: Recovers independently with a single, large step (second realignement is allowed).   ? Compensatory Stepping Correction - Backward No step, OR would fall if not caught, OR falls spontaneously.   ? Compensatory Stepping Correction - Left Lateral Normal: Recovers independently with 1 step (crossover or lateral OK)   ? Compensatory Stepping Correction - Right Lateral Normal: Recovers independently with 1 step (crossover or lateral OK)   ? Stepping Corredtion Lateral - lowest score 2   ? Stance - Feet together, eyes open, firm surface  Normal: 30s   ? Stance - Feet together, eyes closed, foam surface  Normal: 30s   ? Incline - Eyes Closed Moderate: Stands independently < 30s OR aligns with surface   15.44  ? Change in Gait Speed Moderate: Unable to change walking speed or signs of imbalance   ? Walk with head turns - Horizontal Moderate: performs head turns with reduction in gait speed.   ? Walk with pivot turns Normal: Turns with feet close FAST (< 3 steps) with good balance.   ? Step over obstacles Moderate: Steps over box but touches box OR displays cautious behavior by slowing  gait.   ? Timed UP & GO with Dual Task Severe: Stops counting while walking OR stops walking while counting.   ? Mini-BEST total score 17   ?  ? Timed Up and Go Test  ? Normal TUG (seconds) 12.62   ? Manual TUG (seconds) 14.44   ?  Cognitive TUG (seconds) 20.97   stops counting  ? TUG Comments Scores >10% difference indicate increased difficulty with dual tasking.   ? ?  ?  ? ?  ? ? ? ? ? ? ? ? ? ? ? ? ? ?Objective measurements completed on examination: See above findings.  ? ? ? ? ? ? ? ? ? ? ? ? ? ? PT Education - 08/20/21 1215   ? ? Education Details Eval results, POC, provided handout for community virtual PD cycling class, as pt has bike at home; recommended if she doesn't try this class to start by using her recumbent bike 20 min/day each day.   ? Person(s) Educated Patient   ? Methods Explanation;Handout   ? Comprehension Verbalized understanding   ? ?  ?  ? ?  ? ? ? PT Short Term Goals - 08/20/21 1222   ? ?  ? PT SHORT TERM GOAL #1  ? Title Pt will be independent with HEP to address balance, transfers, gait.  TARGET 09/17/2021   ? Time 5   ? Period Weeks   ? Status New   ?  ? PT SHORT TERM GOAL #2  ? Title Pt will perform sit<>stand at least 8 of 10 reps with no posterior lean, modified independently.   ? Time 5   ? Period Weeks   ? Status New   ?  ? PT SHORT TERM GOAL #3  ? Title Pt will improve posterior push and release to 2 steps or less for improved balance recovery.   ? Baseline would fall if unaided   ? Time 5   ? Period Weeks   ? Status New   ? ?  ?  ? ?  ? ? ? ? PT Long Term Goals - 08/20/21 1224   ? ?  ? PT LONG TERM GOAL #1  ? Title Pt will be independent with HEP for Parkinson's specific exercises, to address posture, transfers, balance, gait.  TARGET  10/15/2021   ? Time 9   ? Period Weeks   ? Status New   ?  ? PT LONG TERM GOAL #2  ? Title Pt will improve MiniBESTest score to at least 22/28 for decreased fall risk.   ? Baseline 17/28   ? Time 9   ? Period Weeks   ? Status New   ?  ? PT LONG TERM  GOAL #3  ? Title Pt will improve TUG cognitive/TUG score to less than or equal to 10% difference for improved dual task with gait.   ? Time 9   ? Period Weeks   ? Status New   ?  ? PT LONG TERM GOAL #4

## 2021-08-20 NOTE — Therapy (Signed)
[place] ?[facility] ?[address], [address] ?[place], [place], [address] ?Phone: [phone]   Fax:  [phone] ? ?Speech Language Pathology Evaluation ? ?Patient Details  ?Name: Kelly Howard ?MRN: [id number] ?Date of Birth: 27-Dec-1950 ?Referring Provider (SLP): Peggye Pitt, MD (PCP) ? ? ?Encounter Date: 08/20/2021 ? ? End of Session - 08/20/21 1218   ? ? Visit Number 1   ? Number of Visits 17   ? Date for SLP Re-Evaluation 10/15/21   ? Authorization Type humana   ? SLP Start Time 0848   ? SLP Stop Time  0930   ? SLP Time Calculation (min) 42 min   ? Activity Tolerance Patient limited by lethargy   ? ?  ?  ? ?  ? ? ?Past Medical History:  ?Diagnosis Date  ? Frequent falls   ? Hypothyroidism   ? Osteoporosis   ? Parkinson's disease (HCC)   ? ? ?Past Surgical History:  ?Procedure Laterality Date  ? TONSILLECTOMY    ? WRIST SURGERY    ? ? ?There were no vitals filed for this visit. ? ? Subjective Assessment - 08/20/21 0848   ? ? Subjective MD visit on 08-10-21 ID'd mild-moderate cognitive impairment. Pt stated today low volume was biggest barrier to communication with [name] (husband).   ? Patient is accompained by: Family member   Kelly Howard  ? Currently in Pain? No/denies   ? ?  ?  ? ?  ? ? ? ? ? SLP Evaluation [facility] - 08/20/21 0849   ? ?  ? SLP Visit Information  ? SLP Received On 08/20/21   ? Referring Provider (SLP) Peggye Pitt, MD (PCP)   ? Onset Date Oct 2019   ? Medical Diagnosis Parkinson's Disease   ?  ? Subjective  ? Patient/Family Stated Goal Improve loudness and alleviate pharyngeal stasis occurring in AMs   ?  ? General Information  ? HPI Pt was seen for ST course in 2020-2021, and was d/c'd after unsuccessful attempts at conducting skilled ST due to pt's anxiety/decr'd attention negating possible progress in ST. [name] (husband) states pt has had difficulty with swallowing pills however they are now using applesauce and husbnad and pt state this has helped  significantly. Pt has a personal caregiver, Kelly Howard, with her regularly. [name] agrees that decr'd loudness is a major barrier for communication between them.   ? Behavioral/Cognition Pt lethargic, with decr'd sustained attention noted.   ? Mobility Status arrives without a device but is unsteady   ?  ? Balance Screen  ? Has the patient fallen in the past 6 months --   PT eval just prior to ST  ?  ? Prior Functional Status  ? Cognitive/Linguistic Baseline Baseline deficits   ? Baseline deficit details attention, problem solving, executive function, memory   ?  ? Cognition  ? Overall Cognitive Status History of cognitive impairments - at baseline   ? Area of Impairment Attention;Memory;Safety/judgement;Awareness;Problem solving   ? Behaviors --   lethargic  ?  ? Auditory Comprehension  ? Overall Auditory Comprehension Appears within functional limits for tasks assessed   ?  ? Verbal Expression  ? Overall Verbal Expression Appears within functional limits for tasks assessed   ?  ? Oral Motor/Sensory Function  ? Overall Oral Motor/Sensory Function Impaired   ? Labial Strength Reduced   ? Labial Coordination Reduced   ? Lingual ROM Reduced left;Reduced right   ? Lingual Strength Reduced   ? Lingual  Coordination Reduced   ? Overall Oral Motor/Sensory Function Pt with hypokinetic movement, and sluggish movement - minimally better ROM with demonstration and pt watching movements in mirror   ?  ? Motor Speech  ? Overall Motor Speech Impaired   ? Respiration Impaired   ? Level of Impairment Word   ? Phonation Low vocal intensity;Hoarse   low-mid 60s dB in 3 minutes simple conversation; /a/ mid 60s dB; poor breath support better with mod cues for "big breath"  ? Intelligibility Intelligibility reduced   with background noise  ? Effective Techniques Increased vocal intensity   /a/ incr'd to upper 60s with mod cues; voice quality improved with mod cues for  louder speech in response to simple personal questions; pt cont'd with louder  speech for 3 utterances and then decr'd to low-mid 60s dB once again  ? ?  ?  ? ?  ? ? ? ? ? ? ? ? ? ? ? ? ? ? ? ? ? ? SLP Education - 08/20/21 1216   ? ? Education Details loud /a/ procedure and frequency/reps per day, effortful swallow procedure and frequency/reps per day, goal in therapy is to improve frequency of intelligible speech by improving loudness and secondly to improve pt's swallow strength to improve ability to swallow secretions at night   ? Person(s) Educated Patient;Spouse   ? Methods Explanation;Demonstration;Verbal cues;Handout   ? Comprehension Verbalized understanding;Returned demonstration;Verbal cues required;Need further instruction   ? ?  ?  ? ?  ? ? ? SLP Short Term Goals - 08/20/21 1242   ? ?  ? SLP SHORT TERM GOAL #1  ? Title pt will produce /a/ at upper 60s dB with occasional min A in 3 sessions   ? Time 4   ? Period Weeks   ? Status New   ? Target Date 09/17/21   ?  ? SLP SHORT TERM GOAL #2  ? Title pt will demo incr'd breath support with /a/ 75% of the time with rare min A in 2 sessions   ? Time 4   ? Period Weeks   ? Status New   ? Target Date 09/17/21   ?  ? SLP SHORT TERM GOAL #3  ? Title pt will state functional pt/family generated sentences with volume in upper 60s dB in 3 sessions   ? Time 4   ? Period Weeks   ? Status New   ? Target Date 09/17/21   ?  ? SLP SHORT TERM GOAL #4  ? Title pt will undergo bedside swallow assessment   ? Time 2   ? Period Weeks   ? Status New   ? Target Date 09/03/21   ?  ? SLP SHORT TERM GOAL #5  ? Title pt will perform effortful swallow exercise x8 with occasional min-mod A   ? Time 4   ? Period Weeks   ? Status New   ? Target Date 09/17/21   ? ?  ?  ? ?  ? ? ? SLP Long Term Goals - 08/20/21 1246   ? ?  ? SLP LONG TERM GOAL #1  ? Title pt family/caregiver will demo successful cues to enable pt to achieve loud /a/ at upper 60s dB with occasional min A in 2 sessions   ? Time 8   ? Period Weeks   ? Status New   ? Target Date 10/15/21   ?  ? SLP LONG  TERM GOAL #2  ? Title pt  family/caregiver will demo successful cues to enable pt to read sentences with volume in upper 60s dB in 2 sessions   ? Time 8   ? Period Weeks   ? Status New   ? Target Date 10/15/21   ?  ? SLP LONG TERM GOAL #3  ? Title pt will improve speech volume to upper 60s dB in 3 minutes simple conversation with occasional min-mod cues x3 sessions   ? Time 8   ? Period Weeks   ? Status New   ? Target Date 10/15/21   ?  ? SLP LONG TERM GOAL #4  ? Title pt family/caregiver will cue pt to enable pt to perform swallow HEP with occasional min A, in 2 sessions   ? Time 8   ? Period Weeks   ? Status New   ? Target Date 10/15/21   ?  ? SLP LONG TERM GOAL #5  ? Title (related to LTG #3) pt family/caregiver will cue pt adequately for pt to produce speech volume in upper 60s dB 80% of the time with pt response to "wh" or yes/no questions x3 sessions   ? Time 8   ? Period Weeks   ? Status New   ? Target Date 10/15/21   ? ?  ?  ? ?  ? ? ? Plan - 08/20/21 1219   ? ? Clinical Impression Statement Pt Kelly Howard") presents today with hypophonic speech and reduced vocal quality as a result of Parkinson's disease. Additionally she complains of secretions in her pharyngeal area consistently in the AMs. She denies any overt s/sx of dysphagia during meals but has had difficulty with meds, recently better by using applesauce to assist. SLP provided pt with effortful swallow exercise to complete at least 8-10 TID. SLP to assess swallowing via bedside swallow eval in first 3 sessions. Pt conversational speech today was in low-mid 60s dB and was often unintellligible with background noise. Pt did not always agree with SLP that her speech was difficult to understand when a recording was played back to her, however agreed that louder and more effortful speech was easier to understand due to better quality of voice. SLP believes progress with pt's speech loudness is possible but will be somewhat limited given pt's current  cognitive ability. SLP told pt and husband that goal is to lessen the number of times [name] has to ask Kelly Howard for repeats and not improve Kelly Howard's conversational volume to WNL 90-100% of the time - and both ackno

## 2021-08-20 NOTE — Patient Instructions (Signed)
? ?  10 loud "ahhhhhhhhhhhhhhh" twice each day (makes your voice louder to increase your volume) ? ?10 HARD swallows with a small sip of water, three times each day (makes swallowing stronger to work on that "gunk" in your throat) ?

## 2021-08-24 ENCOUNTER — Ambulatory Visit: Payer: Medicare PPO

## 2021-08-24 ENCOUNTER — Encounter: Payer: Self-pay | Admitting: Physical Therapy

## 2021-08-24 ENCOUNTER — Other Ambulatory Visit: Payer: Self-pay

## 2021-08-24 ENCOUNTER — Ambulatory Visit: Payer: Medicare PPO | Admitting: Physical Therapy

## 2021-08-24 DIAGNOSIS — R1310 Dysphagia, unspecified: Secondary | ICD-10-CM | POA: Diagnosis not present

## 2021-08-24 DIAGNOSIS — R471 Dysarthria and anarthria: Secondary | ICD-10-CM | POA: Diagnosis not present

## 2021-08-24 DIAGNOSIS — M6281 Muscle weakness (generalized): Secondary | ICD-10-CM

## 2021-08-24 DIAGNOSIS — R2689 Other abnormalities of gait and mobility: Secondary | ICD-10-CM | POA: Diagnosis not present

## 2021-08-24 DIAGNOSIS — R2681 Unsteadiness on feet: Secondary | ICD-10-CM | POA: Diagnosis not present

## 2021-08-24 DIAGNOSIS — G20 Parkinson's disease: Secondary | ICD-10-CM | POA: Diagnosis not present

## 2021-08-24 DIAGNOSIS — R41841 Cognitive communication deficit: Secondary | ICD-10-CM | POA: Diagnosis not present

## 2021-08-24 DIAGNOSIS — R293 Abnormal posture: Secondary | ICD-10-CM | POA: Diagnosis not present

## 2021-08-24 DIAGNOSIS — R29818 Other symptoms and signs involving the nervous system: Secondary | ICD-10-CM | POA: Diagnosis not present

## 2021-08-24 NOTE — Patient Instructions (Signed)
? ?  10 "everyday" sentences: ? ?1 ? ?2 ? ?3 ? ?4 ? ?5 ? ?6 ? ?7 ? ?8 ? ?9 ? ?10 ?

## 2021-08-24 NOTE — Therapy (Signed)
[place] ?[facility] ?[address], [address] ?[place], [place], [id number] ?Phone: [phone]   Fax:  [phone] ? ?Speech Language Pathology Treatment ? ?Patient Details  ?Name: Kelly GroutDeborah Lowman Howard ?MRN: [id number] ?Date of Birth: 10/14/1950 ?Referring Provider (SLP): Peggye PittHernandez, Estela, MD (PCP) ? ? ?Encounter Date: 08/24/2021 ? ? End of Session - 08/24/21 1449   ? ? Visit Number 2   ? Number of Visits 17   ? Date for SLP Re-Evaluation 10/15/21   ? Authorization Type humana   ? SLP Start Time 1019   ? SLP Stop Time  1100   ? SLP Time Calculation (min) 41 min   ? Activity Tolerance Patient tolerated treatment well   ? ?  ?  ? ?  ? ? ?Past Medical History:  ?Diagnosis Date  ? Frequent falls   ? Hypothyroidism   ? Osteoporosis   ? Parkinson's disease (HCC)   ? ? ?Past Surgical History:  ?Procedure Laterality Date  ? TONSILLECTOMY    ? WRIST SURGERY    ? ? ?There were no vitals filed for this visit. ? ? Subjective Assessment - 08/24/21 1448   ? ? Subjective Arrives stating she had a panic attack earlier this morning. Pt appears calmed down at this time, much like evaluation.   ? Patient is accompained by: Family member   [name]  ? Currently in Pain? No/denies   ? ?  ?  ? ?  ? ? ? ? ? ? ? ? ADULT SLP TREATMENT - 08/24/21 1018   ? ?  ? General Information  ? Behavior/Cognition Requires cueing;Alert;Cooperative   ?  ? Treatment Provided  ? Treatment provided Cognitive-Linquistic   ?  ? Cognitive-Linquistic Treatment  ? Treatment focused on Dysarthria   ? Skilled Treatment SLP began witih loud /a/ training with [name] present. SLP encouraged [name] to complete with [name] and use same cues as SLP for all tasks. Pt produced loud /a/ with invreasing loudness beginning with upper 60s - low 70s dB and ultimately reaching mid-upper 70s dB with consistent mod cues for louder productions. Pt's next utterance was at [facility] and SLP pointed this out to [place] and [name]. SLP educated pt and [name] about  necessity for home practice consistency, caregiver education and attendance at ST, and need for everyday sentences next session. Pt/[name] demonstrated understanding. SLP then engaged pt with word level responses with consisent mod cues for louder speech faded to consistence min-mod cues for louder speech. Homework for word level responses provided.   ?  ? Assessment / Recommendations / Plan  ? Plan Continue with current plan of care   ?  ? Progression Toward Goals  ? Progression toward goals Progressing toward goals   ? ?  ?  ? ?  ? ? ? SLP Education - 08/24/21 1449   ? ? Education Details see note   ? Person(s) Educated Patient;Spouse   ? Methods Explanation   ? Comprehension Verbalized understanding;Need further instruction   ? ?  ?  ? ?  ? ? ? SLP Short Term Goals - 08/24/21 1451   ? ?  ? SLP SHORT TERM GOAL #1  ? Title pt will produce /a/ at upper 60s dB with occasional min A in 3 sessions   ? Time 4   ? Period Weeks   ? Status New   ? Target Date 09/17/21   ?  ? SLP SHORT TERM GOAL #2  ? Title pt will  demo incr'd breath support with /a/ 75% of the time with rare min A in 2 sessions   ? Time 4   ? Period Weeks   ? Status On-going   ? Target Date 09/17/21   ?  ? SLP SHORT TERM GOAL #3  ? Title pt will state functional pt/family generated sentences with volume in upper 60s dB in 3 sessions   ? Time 4   ? Period Weeks   ? Status On-going   ? Target Date 09/17/21   ?  ? SLP SHORT TERM GOAL #4  ? Title pt will undergo bedside swallow assessment   ? Time 2   ? Period Weeks   ? Status On-going   ? Target Date 09/03/21   ?  ? SLP SHORT TERM GOAL #5  ? Title pt will perform effortful swallow exercise x8 with occasional min-mod A   ? Time 4   ? Period Weeks   ? Status On-going   ? Target Date 09/17/21   ? ?  ?  ? ?  ? ? ? SLP Long Term Goals - 08/24/21 1451   ? ?  ? SLP LONG TERM GOAL #1  ? Title pt family/caregiver will demo successful cues to enable pt to achieve loud /a/ at upper 60s dB with occasional min A in 2  sessions   ? Time 8   ? Period Weeks   ? Status On-going   ? Target Date 10/15/21   ?  ? SLP LONG TERM GOAL #2  ? Title pt family/caregiver will demo successful cues to enable pt to read sentences with volume in upper 60s dB in 2 sessions   ? Time 8   ? Period Weeks   ? Status On-going   ? Target Date 10/15/21   ?  ? SLP LONG TERM GOAL #3  ? Title pt will improve speech volume to upper 60s dB in 3 minutes simple conversation with occasional min-mod cues x3 sessions   ? Time 8   ? Period Weeks   ? Status On-going   ? Target Date 10/15/21   ?  ? SLP LONG TERM GOAL #4  ? Title pt family/caregiver will cue pt to enable pt to perform swallow HEP with occasional min A, in 2 sessions   ? Time 8   ? Period Weeks   ? Status On-going   ? Target Date 10/15/21   ?  ? SLP LONG TERM GOAL #5  ? Title (related to LTG #3) pt family/caregiver will cue pt adequately for pt to produce speech volume in upper 60s dB 80% of the time with pt response to "wh" or yes/no questions x3 sessions   ? Time 8   ? Period Weeks   ? Status On-going   ? Target Date 10/15/21   ? ?  ?  ? ?  ? ? ? Plan - 08/24/21 1450   ? ? Clinical Impression Statement Pt ("[name]") presents today with 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 Additionally she complains of secretions in her pharyngeal area consistently in the AMs. She denies any overt s/sx of dysphagia during meals but has had difficulty with meds, recently better by using applesauce to assist. SLP provided pt with effortful swallow exercise to complete at least 8-10 TID. SLP to assess swallowing via bedside swallow eval in first 3 sessions. Pt will benefit from skilled ST to target improved speech volume to improve communication and  lessen caregiver burden, adn to teach caregiver/s compensatory strategies for improved communication int he future. Secondly, ST will also be helpful in providing support for swallowing/mealtimes. An instrumental swallow  evaluation (MBSS, FEES) may be necessary and signature on this plan of care will indicate MD's approval for the evaluation.   ? Speech Therapy Frequency 2x / week   ? Duration 8 weeks   ? Treatment/Interventions Aspiration precaution training;Pharyngeal strengthening exercises;Diet toleration management by SLP;Trials of upgraded texture/liquids;Cueing hierarchy;Internal/external aids;Patient/family education;SLP instruction and feedback;Functional tasks   ? Potential to Achieve Goals Good   ? Potential Considerations Severity of impairments;Previous level of function;Ability to learn/carryover information;Cooperation/participation level   ? ?  ?  ? ?  ? ? ?Patient will benefit from skilled therapeutic intervention in order to improve the following deficits and impairments:   ?Dysarthria and anarthria ? ?Dysphagia, unspecified type ? ?Cognitive communication deficit ? ? ? ?Problem List ?Patient Active Problem List  ? Diagnosis Date Noted  ? Vitamin D deficiency 07/10/2019  ? Vitamin B12 deficiency 07/10/2019  ? Hypothyroidism   ? Frequent falls   ? Osteoporosis   ? Closed fracture of right distal radius 12/25/2018  ? Parkinson disease (HCC) 10/20/2011  ? ? ?Wilbon Obenchain, CCC-SLP ?08/24/2021, 2:52 PM ? ?[place] ?[facility] ?[address], [address] ?[place], [place], [address] ?Phone: [phone]   Fax:  [phone] ? ? ?Name: Sherell Christoffel ?MRN: [id number] ?Date of Birth: 1950/08/05 ? ?

## 2021-08-24 NOTE — Therapy (Signed)
[place] ?[facility] ?[address], [address] ?[place], [place], [address] ?Phone: [phone]   Fax:  [phone] ? ?Physical Therapy Treatment ? ?Patient Details  ?Name: Kelly Howard ?MRN: [id number] ?Date of Birth: 1950/12/10 ?Referring Provider (PT): Philip Aspen, MD ? ? ?Encounter Date: 08/24/2021 ? ? PT End of Session - 08/24/21 4503   ? ? Visit Number 2   ? Number of Visits 17   ? Date for PT Re-Evaluation 10/15/21   ? Authorization Type [employer]   ? Progress Note Due on Visit 10   ? PT Start Time [phone]   pt arrived late, then was in restroom  ? PT Stop Time 1015   ? PT Time Calculation (min) 32 min   ? Activity Tolerance Patient tolerated treatment well   ? Behavior During Therapy Restless;Flat affect;Anxious   tearful  ? ?  ?  ? ?  ? ? ?Past Medical History:  ?Diagnosis Date  ? Frequent falls   ? Hypothyroidism   ? Osteoporosis   ? Parkinson's disease (HCC)   ? ? ?Past Surgical History:  ?Procedure Laterality Date  ? TONSILLECTOMY    ? WRIST SURGERY    ? ? ?There were no vitals filed for this visit. ? ? Subjective Assessment - 08/24/21 0942   ? ? Subjective Very upset about my mom's funeral.  Having a very hard time today; was running late to get here today.   ? Patient is accompained by: Family member   Husband-provides a lot of history  ? Pertinent History PMH:  GAD, panic attacks, osteoporosis, hypothyroidism, hx of UE fractures (R and L)   ? Patient Stated Goals Whatever we can do to help get better.  Would like to get back into exercises.   ? Currently in Pain? No/denies   ? ?  ?  ? ?  ? ? ? ? ? ? ? ? ? ? ? ? ? ? ? ? ? ? ? ? [facility] Adult PT Treatment/Exercise - 08/24/21 0001   ? ?  ? Transfers  ? Transfers Sit to Stand;Stand to Sit   ? Sit to Stand From chair/3-in-1;4: Min guard;4: Min assist;With upper extremity assist   ? Stand to Sit 4: Min guard;To chair/3-in-1;4: Min assist;With upper extremity assist   ? Comments Toilet transfers, Nustep transfers   ?  ?  Ambulation/Gait  ? Ambulation/Gait Yes   ? Ambulation/Gait Assistance 5: Supervision;4: Min guard   ? Ambulation Distance (Feet) 30 Feet   80  ? Assistive device None;1 person hand held assist   ? Gait Pattern Step-through pattern;Decreased arm swing - right;Decreased arm swing - left;Scissoring;Decreased dorsiflexion - right;Decreased dorsiflexion - left;Narrow base of support;Poor foot clearance - left;Poor foot clearance - right   ? Ambulation Surface Level;Indoor   ? Gait Comments Short distance gait in clinic bathroom to gym area.  Pt experiences freezing episode coming out of bathroom door.  PT provides HHA and cues for weightshifting to initiate gait and turns.   ?  ? Exercises  ? Exercises Knee/Hip   ?  ? Knee/Hip Exercises: Aerobic  ? Nustep NuStep, Level 3, 4 extremities, x 10 minutes for flexibility, aerobic activity.  Maintains steps/min >70 throughout.   ? ?  ?  ? ?  ? ? ? ? ? ? ? ? ? ? PT Education - 08/24/21 1208   ? ? Education Details Tips to reduce freezing/festinating episodes with gait/turns:  stop and reset; rocking through hips,  use of RW if needed during more off-times   ? Person(s) Educated Patient   ? Methods Explanation;Demonstration   ? Comprehension Verbalized understanding;Verbal cues required;Tactile cues required;Need further instruction   ? ?  ?  ? ?  ? ? ? PT Short Term Goals - 08/20/21 1222   ? ?  ? PT SHORT TERM GOAL #1  ? Title Pt will be independent with HEP to address balance, transfers, gait.  TARGET 09/17/2021   ? Time 5   ? Period Weeks   ? Status New   ?  ? PT SHORT TERM GOAL #2  ? Title Pt will perform sit<>stand at least 8 of 10 reps with no posterior lean, modified independently.   ? Time 5   ? Period Weeks   ? Status New   ?  ? PT SHORT TERM GOAL #3  ? Title Pt will improve posterior push and release to 2 steps or less for improved balance recovery.   ? Baseline would fall if unaided   ? Time 5   ? Period Weeks   ? Status New   ? ?  ?  ? ?  ? ? ? ? PT Long Term Goals -  08/20/21 1224   ? ?  ? PT LONG TERM GOAL #1  ? Title Pt will be independent with HEP for Parkinson's specific exercises, to address posture, transfers, balance, gait.  TARGET  10/15/2021   ? Time 9   ? Period Weeks   ? Status New   ?  ? PT LONG TERM GOAL #2  ? Title Pt will improve MiniBESTest score to at least 22/28 for decreased fall risk.   ? Baseline 17/28   ? Time 9   ? Period Weeks   ? Status New   ?  ? PT LONG TERM GOAL #3  ? Title Pt will improve TUG cognitive/TUG score to less than or equal to 10% difference for improved dual task with gait.   ? Time 9   ? Period Weeks   ? Status New   ?  ? PT LONG TERM GOAL #4  ? Title Pt will verbalize plans for ongoing community fitness upon d/c from PT.   ? Time 9   ? Period Weeks   ? Status New   ? ?  ?  ? ?  ? ? ? ? ? ? ? ? Plan - 08/24/21 1209   ? ? Clinical Impression Statement Pt tearful and reports being anxious at beginning of session today due to circumstances surrounding her mother's funeral arrangements and rushing to get to therapy today.  PT assisted pt with bathroom transfers and short distance gait in and out of bathroom, as she was having difficulty with festinating in doorway of bathroom.  She tolerated use of aerobic machine well without c/o, and she responded well to cues for tips to reduce freezing.  Husband present at end of session for this verbal/demo instruction; pt will need continued practice and reinforcement.   ? Personal Factors and Comorbidities Comorbidity 3+;Other   hx of psycological issues-anxiety; husband reported memory loss/confusion  ? Comorbidities PMH:  GAD, panic attacks, osteoporosis, hypothyroidism, hx of UE fractures (R and L)   ? Examination-Activity Limitations Locomotion Level;Transfers;Squat;Stairs;Stand   ? Examination-Participation Restrictions Community Activity;Shop;Other   community fitness  ? Stability/Clinical Decision Making Evolving/Moderate complexity   ? Rehab Potential Good   ? PT Frequency 2x / week   ? PT  Duration Other (comment)  8 weeks, plus eval visit ; TOTAL POC = 9 weeks  ? PT Treatment/Interventions ADLs/Self Care Home Management;Gait training;Stair training;Functional mobility training;Therapeutic activities;Therapeutic exercise;Balance training;Neuromuscular re-education;DME Instruction;Manual techniques;Patient/family education   ? PT Next Visit Plan Initiate HEP-to address posture, balance, gait training.  Try PWR! Moves in sitting, standing.  Gait training with slowed pace, attention to arm swing, heelstrike.  Use of aerobic machines.  Tips to reduce freezing/festination with gait.   ? Consulted and Agree with Plan of Care Patient;Family member/caregiver   ? Family Member Consulted Husband   ? ?  ?  ? ?  ? ? ?Patient will benefit from skilled therapeutic intervention in order to improve the following deficits and impairments:  Abnormal gait, Difficulty walking, Decreased balance, Postural dysfunction, Decreased strength, Decreased mobility ? ?Visit Diagnosis: ?Other abnormalities of gait and mobility ? ?Unsteadiness on feet ? ?Muscle weakness (generalized) ? ?Abnormal posture ? ? ? ? ?Problem List ?Patient Active Problem List  ? Diagnosis Date Noted  ? Vitamin D deficiency 07/10/2019  ? Vitamin B12 deficiency 07/10/2019  ? Hypothyroidism   ? Frequent falls   ? Osteoporosis   ? Closed fracture of right distal radius 12/25/2018  ? Parkinson disease (HCC) 10/20/2011  ? ? ?Zephyr Ridley W., PT ?08/24/2021, 12:14 PM ? ?[place] ?[facility] ?[address], [address] ?[place], [place], [address] ?Phone: [phone]   Fax:  [phone] ? ?Name: Kelly Howard ?MRN: [id number] ?Date of Birth: 1950-06-19 ? ? ? ?

## 2021-08-26 ENCOUNTER — Ambulatory Visit: Payer: Medicare PPO

## 2021-08-26 DIAGNOSIS — R41841 Cognitive communication deficit: Secondary | ICD-10-CM | POA: Diagnosis not present

## 2021-08-26 DIAGNOSIS — R471 Dysarthria and anarthria: Secondary | ICD-10-CM | POA: Diagnosis not present

## 2021-08-26 DIAGNOSIS — R29818 Other symptoms and signs involving the nervous system: Secondary | ICD-10-CM | POA: Diagnosis not present

## 2021-08-26 DIAGNOSIS — R1310 Dysphagia, unspecified: Secondary | ICD-10-CM | POA: Diagnosis not present

## 2021-08-26 DIAGNOSIS — R293 Abnormal posture: Secondary | ICD-10-CM | POA: Diagnosis not present

## 2021-08-26 DIAGNOSIS — G20 Parkinson's disease: Secondary | ICD-10-CM | POA: Diagnosis not present

## 2021-08-26 DIAGNOSIS — R2681 Unsteadiness on feet: Secondary | ICD-10-CM | POA: Diagnosis not present

## 2021-08-26 DIAGNOSIS — M6281 Muscle weakness (generalized): Secondary | ICD-10-CM | POA: Diagnosis not present

## 2021-08-26 DIAGNOSIS — R2689 Other abnormalities of gait and mobility: Secondary | ICD-10-CM | POA: Diagnosis not present

## 2021-08-26 NOTE — Therapy (Signed)
[place] ?[facility] ?[address], [address] ?[place], [place], [address] ?Phone: [phone]   Fax:  [phone] ? ?Speech Language Pathology Treatment ? ?Patient Details  ?Name: Kelly Howard ?MRN: [id number] ?Date of Birth: 12-15-1950 ?Referring Provider (SLP): Kelly Pitt, MD (PCP) ? ? ?Encounter Date: 08/26/2021 ? ? End of Session - 08/26/21 0933   ? ? Visit Number 3   ? Number of Visits 17   ? Date for SLP Re-Evaluation 10/15/21   ? Authorization Type humana   ? Authorization Time Period 08/20/21 to 10-27-21   ? Authorization - Visit Number 2   ? Authorization - Number of Visits 16   ? SLP Start Time 0935   ? SLP Stop Time  1015   ? SLP Time Calculation (min) 40 min   ? Activity Tolerance Patient tolerated treatment well   ? ?  ?  ? ?  ? ? ?Past Medical History:  ?Diagnosis Date  ? Frequent falls   ? Hypothyroidism   ? Osteoporosis   ? Parkinson's disease (HCC)   ? ? ?Past Surgical History:  ?Procedure Laterality Date  ? TONSILLECTOMY    ? WRIST SURGERY    ? ? ?There were no vitals filed for this visit. ? ? Subjective Assessment - 08/26/21 0934   ? ? Subjective Pt stated she has done some of the homework from previous session (10 loud "ah" and 10 effortful swallows TID)   ? Currently in Pain? No/denies   ? ?  ?  ? ?  ? ? ? ? ? ? ? ? ADULT SLP TREATMENT - 08/26/21 0953   ? ?  ? General Information  ? Behavior/Cognition Alert;Cooperative;Pleasant mood;Requires cueing;Decreased sustained attention   ?  ? Treatment Provided  ? Treatment provided Cognitive-Linquistic   ?  ? Cognitive-Linquistic Treatment  ? Treatment focused on Dysarthria   ? Skilled Treatment SLP noted tangential conversation periodically with pt today during session. SLP used loud /a/ to habitualize pt's louder conversational speech. Pt req'd SLP max cues for loudness and for full breath - simultaneous production necessary - average low 80s. SLP did drop out of loud /a/ after pt began, with cue for pt to  "you keep going!" Pt stated everyday sentences (conversational sentences) with repetition, in mid 70s dB and mod cues consistently for loudness. Pt somnolent requring SLP tapping her knee to wake x3.   ?  ? Assessment / Recommendations / Plan  ? Plan Continue with current plan of care   ?  ? Progression Toward Goals  ? Progression toward goals Progressing toward goals   ? ?  ?  ? ?  ? ? ? ? ? SLP Short Term Goals - 08/26/21 1240   ? ?  ? SLP SHORT TERM GOAL #1  ? Title pt will produce /a/ at upper 60s dB with occasional min A in 3 sessions   ? Time 4   ? Period Weeks   ? Status On-going   ? Target Date 09/17/21   ?  ? SLP SHORT TERM GOAL #2  ? Title pt will demo incr'd breath support with /a/ 75% of the time with rare min A in 2 sessions   ? Time 4   ? Period Weeks   ? Status On-going   ? Target Date 09/17/21   ?  ? SLP SHORT TERM GOAL #3  ? Title pt will state functional pt/family generated sentences with volume in upper 60s dB in  3 sessions   ? Time 4   ? Period Weeks   ? Status On-going   ? Target Date 09/17/21   ?  ? SLP SHORT TERM GOAL #4  ? Title pt will undergo bedside swallow assessment   ? Time 2   ? Period Weeks   ? Status On-going   ? Target Date 09/03/21   ?  ? SLP SHORT TERM GOAL #5  ? Title pt will perform effortful swallow exercise x8 with occasional min-mod A   ? Time 4   ? Period Weeks   ? Status On-going   ? Target Date 09/17/21   ? ?  ?  ? ?  ? ? ? SLP Long Term Goals - 08/26/21 1240   ? ?  ? SLP LONG TERM GOAL #1  ? Title pt family/caregiver will demo successful cues to enable pt to achieve loud /a/ at upper 60s dB with occasional min A in 2 sessions   ? Time 8   ? Period Weeks   ? Status On-going   ? Target Date 10/15/21   ?  ? SLP LONG TERM GOAL #2  ? Title pt family/caregiver will demo successful cues to enable pt to read sentences with volume in upper 60s dB in 2 sessions   ? Time 8   ? Period Weeks   ? Status On-going   ? Target Date 10/15/21   ?  ? SLP LONG TERM GOAL #3  ? Title pt will  improve speech volume to upper 60s dB in 3 minutes simple conversation with occasional min-mod cues x3 sessions   ? Time 8   ? Period Weeks   ? Status On-going   ? Target Date 10/15/21   ?  ? SLP LONG TERM GOAL #4  ? Title pt family/caregiver will cue pt to enable pt to perform swallow HEP with occasional min A, in 2 sessions   ? Time 8   ? Period Weeks   ? Status On-going   ? Target Date 10/15/21   ?  ? SLP LONG TERM GOAL #5  ? Title (related to LTG #3) pt family/caregiver will cue pt adequately for pt to produce speech volume in upper 60s dB 80% of the time with pt response to "wh" or yes/no questions x3 sessions   ? Time 8   ? Period Weeks   ? Status On-going   ? Target Date 10/15/21   ? ?  ?  ? ?  ? ? ? Plan - 08/26/21 1237   ? ? Clinical Impression Statement Pt ("Kelly Howard") presents today with 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 SLP told pt to complete effortful swallow exercise TID - 8-10 reps. Pt will benefit from skilled ST to target improved speech volume to improve communication and lessen caregiver burden, adn to teach caregiver/s compensatory strategies for improved communication int he future. Secondly, ST will also be helpful in providing support for swallowing/mealtimes. An instrumental swallow evaluation (MBSS, FEES) may be necessary and signature on this plan of care will indicate MD's approval for the evaluation.   ? Speech Therapy Frequency 2x / week   ? Duration 8 weeks   ? Treatment/Interventions Aspiration precaution training;Pharyngeal strengthening exercises;Diet toleration management by SLP;Trials of upgraded texture/liquids;Cueing hierarchy;Internal/external aids;Patient/family education;SLP instruction and feedback;Functional tasks   ? Potential to Achieve Goals Good   ? Potential Considerations Severity of impairments;Previous level of function;Ability to learn/carryover information;Cooperation/participation level   ? ?  ?  ? ?   ? ? ?  Patient will benefit from skilled therapeutic intervention in order to improve the following deficits and impairments:   ?Dysarthria and anarthria ? ?Dysphagia, unspecified type ? ?Cognitive communication deficit ? ? ? ?Problem List ?Patient Active Problem List  ? Diagnosis Date Noted  ? Vitamin D deficiency 07/10/2019  ? Vitamin B12 deficiency 07/10/2019  ? Hypothyroidism   ? Frequent falls   ? Osteoporosis   ? Closed fracture of right distal radius 12/25/2018  ? Parkinson disease (HCC) 10/20/2011  ? ? ?Juletta Berhe, CCC-SLP ?08/26/2021, 12:41 PM ? ?[place] ?[facility] ?[address], [address] ?[place], [place], [address] ?Phone: [phone]   Fax:  [phone] ? ? ?Name: Kelly Howard ?MRN: [id number] ?Date of Birth: 08/23/1950 ? ?

## 2021-08-26 NOTE — Patient Instructions (Signed)
? ? ?  10 "everyday" sentences to SHOUT right after loud "ahhhhhh" ?  ?1 ?  ?2 ?  ?3 ?  ?4 ?  ?5 ?  ?6 ?  ?7 ?  ?8 ?  ?9 ?  ?10 ?

## 2021-08-27 DIAGNOSIS — M818 Other osteoporosis without current pathological fracture: Secondary | ICD-10-CM

## 2021-08-27 DIAGNOSIS — G20 Parkinson's disease: Secondary | ICD-10-CM | POA: Diagnosis not present

## 2021-08-31 ENCOUNTER — Ambulatory Visit: Payer: Medicare PPO

## 2021-09-01 ENCOUNTER — Ambulatory Visit: Payer: Medicare PPO | Admitting: Physical Therapy

## 2021-09-01 DIAGNOSIS — R350 Frequency of micturition: Secondary | ICD-10-CM | POA: Diagnosis not present

## 2021-09-01 DIAGNOSIS — R351 Nocturia: Secondary | ICD-10-CM | POA: Diagnosis not present

## 2021-09-06 ENCOUNTER — Encounter: Payer: Self-pay | Admitting: Physical Therapy

## 2021-09-06 ENCOUNTER — Ambulatory Visit: Payer: Medicare PPO | Attending: Internal Medicine | Admitting: Physical Therapy

## 2021-09-06 ENCOUNTER — Ambulatory Visit: Payer: Medicare PPO

## 2021-09-06 DIAGNOSIS — R293 Abnormal posture: Secondary | ICD-10-CM | POA: Diagnosis not present

## 2021-09-06 DIAGNOSIS — R2681 Unsteadiness on feet: Secondary | ICD-10-CM | POA: Diagnosis not present

## 2021-09-06 DIAGNOSIS — R471 Dysarthria and anarthria: Secondary | ICD-10-CM | POA: Insufficient documentation

## 2021-09-06 DIAGNOSIS — R2689 Other abnormalities of gait and mobility: Secondary | ICD-10-CM | POA: Diagnosis not present

## 2021-09-06 DIAGNOSIS — R1310 Dysphagia, unspecified: Secondary | ICD-10-CM | POA: Diagnosis not present

## 2021-09-06 DIAGNOSIS — R41841 Cognitive communication deficit: Secondary | ICD-10-CM

## 2021-09-06 DIAGNOSIS — M6281 Muscle weakness (generalized): Secondary | ICD-10-CM | POA: Insufficient documentation

## 2021-09-06 NOTE — Therapy (Signed)
?[facility] ?[address] ?[place], [place], [address] ?Phone: [phone]   Fax:  [phone] ? ?Physical Therapy Treatment ? ?Patient Details  ?Name: Alessandra GroutDeborah Lowman Cueva ?MRN: [id number] ?Date of Birth: 02/11/1951 ?Referring Provider (PT): Philip AspenHernandez-Acosta, MD ? ? ?Encounter Date: 09/06/2021 ? ? PT End of Session - 09/06/21 0806   ? ? Visit Number 3   ? Number of Visits 17   ? Date for PT Re-Evaluation 10/15/21   ? Authorization Type [employer]   ? Progress Note Due on Visit 10   ? PT Start Time [phone]   Pt arrives and goes directly into restroom  ? PT Stop Time 0849   ? PT Time Calculation (min) 39 min   ? Activity Tolerance Patient tolerated treatment well   ? Behavior During Therapy Restless;Flat affect;Anxious   tearful at times  ? ?  ?  ? ?  ? ? ?Past Medical History:  ?Diagnosis Date  ? Frequent falls   ? Hypothyroidism   ? Osteoporosis   ? Parkinson's disease (HCC)   ? ? ?Past Surgical History:  ?Procedure Laterality Date  ? TONSILLECTOMY    ? WRIST SURGERY    ? ? ?There were no vitals filed for this visit. ? ? Subjective Assessment - 09/06/21 0806   ? ? Subjective Don't know that these early times will work.  Feel somewhat anxious today.   ? Patient is accompained by: --   ? Pertinent History PMH:  GAD, panic attacks, osteoporosis, hypothyroidism, hx of UE fractures (R and L)   ? Patient Stated Goals Whatever we can do to help get better.  Would like to get back into exercises.   ? Currently in Pain? No/denies   ? ?  ?  ? ?  ? ? ? ? ? ? ?Pt performs PWR! Moves in sitting position   ?  ?PWR! Up for improved posture x 5 slow, sustained reps, then 10 reps with increased effort ? ?PWR! Rock for improved weighshifting, x 3 initial slow, sustained reps, then 10 reps with cues for increased effort. ? ?PWR! Twist for improved trunk rotation, x 3 initial slow, sustained reps, then 10 reps with cues for increased effort. ? ?PWR! Step for improved step initiation x 3  reps initial, slow reps, then 10 reps with cues for increased effort. ? ?Visual, verbal cues provided for technique, intensity ? ? ? ? ? ? ? ? ? ? ? ? ? ? ? ? ? ? [facility] Adult PT Treatment/Exercise - 09/06/21 0001   ? ?  ? Transfers  ? Transfers Sit to Stand;Stand to Sit   ? Sit to Stand 5: Supervision;4: Min guard;From bed;With upper extremity assist   ? Stand to Sit 4: Min guard;With upper extremity assist;To bed   ? Number of Reps 1 set;Other reps (comment)   5 reps, initial cues for technique  ?  ? Ambulation/Gait  ? Ambulation/Gait Yes   ? Ambulation/Gait Assistance 4: Min guard   ? Ambulation Distance (Feet) 255 Feet   60 ft x 2  ? Assistive device Rollator   ? Gait Pattern Step-through pattern;Decreased arm swing - right;Decreased arm swing - left;Scissoring;Decreased dorsiflexion - right;Decreased dorsiflexion - left;Narrow base of support;Poor foot clearance - left;Poor foot clearance - right   ? Ambulation Surface Level;Indoor   ? Gait Comments Cues for increased step length "kick feet under the seat" and to widen BOS.   ?  ? Knee/Hip Exercises: Aerobic  ?  Nustep NuStep, Level 2, 4 extremities, x 8 minutes, for aerobic warm-up, consistent cues to keep steps/min >50.  Pt dips below into 40's step/min with conversation tasks.   ? ?  ?  ? ?  ? ? ? ? ? ? ? ? ? ? PT Education - 09/06/21 0846   ? ? Education Details Requested pt to bring husband in next session to show seated PWR! Moves so we can add to HEP   ? Person(s) Educated Patient   ? Methods Explanation   ? Comprehension Verbalized understanding   ? ?  ?  ? ?  ? ? ? PT Short Term Goals - 09/06/21 0858   ? ?  ? PT SHORT TERM GOAL #1  ? Title Pt will be independent with HEP to address balance, transfers, gait.  TARGET 09/17/2021   ? Time 5   ? Period Weeks   ? Status On-going   ?  ? PT SHORT TERM GOAL #2  ? Title Pt will perform sit<>stand at least 8 of 10 reps with no posterior lean, modified independently.   ? Time 5   ? Period Weeks   ? Status On-going   ?   ? PT SHORT TERM GOAL #3  ? Title Pt will improve posterior push and release to 2 steps or less for improved balance recovery.   ? Baseline would fall if unaided   ? Time 5   ? Period Weeks   ? Status On-going   ? ?  ?  ? ?  ? ? ? ? PT Long Term Goals - 09/06/21 0858   ? ?  ? PT LONG TERM GOAL #1  ? Title Pt will be independent with HEP for Parkinson's specific exercises, to address posture, transfers, balance, gait.  TARGET  10/15/2021   ? Time 9   ? Period Weeks   ? Status On-going   ?  ? PT LONG TERM GOAL #2  ? Title Pt will improve MiniBESTest score to at least 22/28 for decreased fall risk.   ? Baseline 17/28   ? Time 9   ? Period Weeks   ? Status On-going   ?  ? PT LONG TERM GOAL #3  ? Title Pt will improve TUG cognitive/TUG score to less than or equal to 10% difference for improved dual task with gait.   ? Time 9   ? Period Weeks   ? Status On-going   ?  ? PT LONG TERM GOAL #4  ? Title Pt will verbalize plans for ongoing community fitness upon d/c from PT.   ? Time 9   ? Period Weeks   ? Status On-going   ? ?  ?  ? ?  ? ? ? ? ? ? ? ? Plan - 09/06/21 0846   ? ? Clinical Impression Statement Skilled PT session today focused on initial aerobic warm up (frequent cues for increased intensity), as well as seated PWR! Moves to focus on sustained movements and large amplitude, and gait training with rollator.  Pt needs frequent tactil, visual, verbal cues with seated PWR! Moves and frequent redirection to tasks throughout session.  With seated PWR! Moves (with cues), she is able to generate adequate intensity for improved movement patterns.  She appears more steady with gait with rollator, but she needs cues to widen BOS.  She will continue to benefit from skilled PT towards goals for improved overall functional mobility.   ? Personal Factors and Comorbidities Comorbidity 3+;Other  hx of psycological issues-anxiety; husband reported memory loss/confusion  ? Comorbidities PMH:  GAD, panic attacks, osteoporosis,  hypothyroidism, hx of UE fractures (R and L)   ? Examination-Activity Limitations Locomotion Level;Transfers;Squat;Stairs;Stand   ? Examination-Participation Restrictions Community Activity;Shop;Other   community fitness  ? Stability/Clinical Decision Making Evolving/Moderate complexity   ? Rehab Potential Good   ? PT Frequency 2x / week   ? PT Duration Other (comment)   8 weeks, plus eval visit ; TOTAL POC = 9 weeks  ? PT Treatment/Interventions ADLs/Self Care Home Management;Gait training;Stair training;Functional mobility training;Therapeutic activities;Therapeutic exercise;Balance training;Neuromuscular re-education;DME Instruction;Manual techniques;Patient/family education   ? PT Next Visit Plan Initiate HEP for seated PWR! Moves (if husband present to provide picture).  Continue to address posture, balance, gait training.  Try PWR! Moves in standing.  Gait training with slowed pace, attention to arm swing, heelstrike.  Use of aerobic machines.  Tips to reduce freezing/festination with gait.   ? Consulted and Agree with Plan of Care Patient   ? ?  ?  ? ?  ? ? ?Patient will benefit from skilled therapeutic intervention in order to improve the following deficits and impairments:  Abnormal gait, Difficulty walking, Decreased balance, Postural dysfunction, Decreased strength, Decreased mobility ? ?Visit Diagnosis: ?Unsteadiness on feet ? ?Muscle weakness (generalized) ? ?Abnormal posture ? ?Other abnormalities of gait and mobility ? ? ? ? ?Problem List ?Patient Active Problem List  ? Diagnosis Date Noted  ? Vitamin D deficiency 07/10/2019  ? Vitamin B12 deficiency 07/10/2019  ? Hypothyroidism   ? Frequent falls   ? Osteoporosis   ? Closed fracture of right distal radius 12/25/2018  ? Parkinson disease (HCC) 10/20/2011  ? ? ?Raquel Racey W., PT ?09/06/2021, 8:59 AM ? ?[place] ?[facility] ?[address], [address] ?[place], [place], [address] ?Phone: [phone]   Fax:   [phone] ? ?Name: Arushi Partridge ?MRN: [id number] ?Date of Birth: Sep 29, 1950 ? ? ? ?

## 2021-09-07 ENCOUNTER — Encounter: Payer: Self-pay | Admitting: Adult Health

## 2021-09-07 ENCOUNTER — Ambulatory Visit (INDEPENDENT_AMBULATORY_CARE_PROVIDER_SITE_OTHER): Payer: Medicare PPO | Admitting: Adult Health

## 2021-09-07 DIAGNOSIS — F331 Major depressive disorder, recurrent, moderate: Secondary | ICD-10-CM | POA: Diagnosis not present

## 2021-09-07 DIAGNOSIS — G4700 Insomnia, unspecified: Secondary | ICD-10-CM

## 2021-09-07 DIAGNOSIS — F410 Panic disorder [episodic paroxysmal anxiety] without agoraphobia: Secondary | ICD-10-CM | POA: Diagnosis not present

## 2021-09-07 DIAGNOSIS — F411 Generalized anxiety disorder: Secondary | ICD-10-CM

## 2021-09-07 MED ORDER — CLONAZEPAM 0.5 MG PO TABS: 0.5000 mg | ORAL_TABLET | Freq: Three times a day (TID) | ORAL | 2 refills | Status: DC | PRN

## 2021-09-07 NOTE — Therapy (Signed)
[place] ?[facility] ?[address], [address] ?[place], [place], [address] ?Phone: [phone]   Fax:  [phone] ? ?Speech Language Pathology Treatment ? ?Patient Details  ?Name: Kelly GroutDeborah Lowman Howard ?MRN: [id number] ?Date of Birth: 05/15/1951 ?Referring Provider (SLP): Peggye PittHernandez, Estela, MD (PCP) ? ? ?Encounter Date: 09/06/2021 ? ? End of Session - 09/07/21 0005   ? ? Visit Number 4   ? Number of Visits 17   ? Date for SLP Re-Evaluation 10/15/21   ? Authorization Type humana   ? Authorization Time Period 08/20/21 to 10-27-21   ? Authorization - Visit Number 3   ? Authorization - Number of Visits 16   ? SLP Start Time [phone]   ? SLP Stop Time  0926   ? SLP Time Calculation (min) 39 min   ? Activity Tolerance Patient tolerated treatment well   ? ?  ?  ? ?  ? ? ?Past Medical History:  ?Diagnosis Date  ? Frequent falls   ? Hypothyroidism   ? Osteoporosis   ? Parkinson's disease (HCC)   ? ? ?Past Surgical History:  ?Procedure Laterality Date  ? TONSILLECTOMY    ? WRIST SURGERY    ? ? ?There were no vitals filed for this visit. ? ? Subjective Assessment - 09/06/21 0858   ? ? Subjective "I think maybe I should come back when it will be easier for me."   ? Patient is accompained by: Family member   [name]  ? Currently in Pain? No/denies   ? ?  ?  ? ?  ? ? ? ? ? ? ? ? ADULT SLP TREATMENT - 09/06/21 0859   ? ?  ? General Information  ? Behavior/Cognition Distractible;Alert;Agitated   mildly anxious today  ?  ? Treatment Provided  ? Treatment provided Cognitive-Linquistic   ?  ? Cognitive-Linquistic Treatment  ? Treatment focused on Dysarthria   ? Skilled Treatment "I'm not having a good day," pt stated as soon as entering ST room. Pt told SLP she could not continue with ST after PT today and husband encouraged to continue through this session. Pt loud /a/ produced with max cues for loudness and full breath. Simultaneous production required for pt's first 3 productions and pt averaged mid 70s dB. Pt  forgot her everyday sentences so SLP had pt repeat conversational sentences with pt averaging low-mid 60s dB initially incr'd to mid 60s - with consistent max A for loudness and full breath. Pt asked SLP 6 times in 35 minutes if she could go home and each time husband encouraged pt to remain seated and cont with ST. Pt took meds with applesauch and had no overt s/s pharyngeal difficulty.   ?  ? Assessment / Recommendations / Plan  ? Plan Continue with current plan of care   ?  ? Progression Toward Goals  ? Progression toward goals Not progressing toward goals (comment)   ? ?  ?  ? ?  ? ? ? ? ? SLP Short Term Goals - 09/07/21 0005   ? ?  ? SLP SHORT TERM GOAL #1  ? Title pt will produce /a/ at upper 60s dB with occasional min A in 3 sessions   ? Baseline 09-06-21   ? Time 4   ? Period Weeks   ? Status On-going   ? Target Date 09/17/21   ?  ? SLP SHORT TERM GOAL #2  ? Title pt will demo incr'd breath support with /a/ 75%  of the time with rare min A in 2 sessions   ? Time 4   ? Period Weeks   ? Status On-going   ? Target Date 09/17/21   ?  ? SLP SHORT TERM GOAL #3  ? Title pt will state functional pt/family generated sentences with volume in upper 60s dB in 3 sessions   ? Time 4   ? Period Weeks   ? Status On-going   ? Target Date 09/17/21   ?  ? SLP SHORT TERM GOAL #4  ? Title pt will undergo bedside swallow assessment   ? Time 2   ? Period Weeks   ? Status On-going   ? Target Date 09/03/21   ?  ? SLP SHORT TERM GOAL #5  ? Title pt will perform effortful swallow exercise x8 with occasional min-mod A   ? Time 4   ? Period Weeks   ? Status On-going   ? Target Date 09/17/21   ? ?  ?  ? ?  ? ? ? SLP Long Term Goals - 09/07/21 0006   ? ?  ? SLP LONG TERM GOAL #1  ? Title pt family/caregiver will demo successful cues to enable pt to achieve loud /a/ at upper 60s dB with occasional min A in 2 sessions   ? Baseline 09-06-21   ? Time 8   ? Period Weeks   ? Status On-going   ? Target Date 10/15/21   ?  ? SLP LONG TERM GOAL #2  ?  Title pt family/caregiver will demo successful cues to enable pt to read sentences with volume in upper 60s dB in 2 sessions   ? Time 8   ? Period Weeks   ? Status On-going   ? Target Date 10/15/21   ?  ? SLP LONG TERM GOAL #3  ? Title pt will improve speech volume to upper 60s dB in 3 minutes simple conversation with occasional min-mod cues x3 sessions   ? Time 8   ? Period Weeks   ? Status On-going   ? Target Date 10/15/21   ?  ? SLP LONG TERM GOAL #4  ? Title pt family/caregiver will cue pt to enable pt to perform swallow HEP with occasional min A, in 2 sessions   ? Time 8   ? Period Weeks   ? Status On-going   ? Target Date 10/15/21   ?  ? SLP LONG TERM GOAL #5  ? Title (related to LTG #3) pt family/caregiver will cue pt adequately for pt to produce speech volume in upper 60s dB 80% of the time with pt response to "wh" or yes/no questions x3 sessions   ? Time 8   ? Period Weeks   ? Status On-going   ? Target Date 10/15/21   ? ?  ?  ? ?  ? ? ? Plan - 09/07/21 0005   ? ? Clinical Impression Statement Pt ("Kelly Howard") presents today with 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 Pt will benefit from skilled ST to target improved speech volume to improve communication and lessen caregiver burden, adn to teach caregiver/s compensatory strategies for improved communication int he future. Secondly, ST will also be helpful in providing support for swallowing/mealtimes. An instrumental swallow evaluation (MBSS, FEES) may be necessary and signature on this plan of care will indicate MD's approval for the evaluation.   ? Speech Therapy Frequency 2x / week   ? Duration  8 weeks   ? Treatment/Interventions Aspiration precaution training;Pharyngeal strengthening exercises;Diet toleration management by SLP;Trials of upgraded texture/liquids;Cueing hierarchy;Internal/external aids;Patient/family education;SLP instruction and feedback;Functional tasks   ? Potential to Achieve Goals Good    ? Potential Considerations Severity of impairments;Previous level of function;Ability to learn/carryover information;Cooperation/participation level   ? ?  ?  ? ?  ? ? ?Patient will benefit from skilled therapeutic intervention in order to improve the following deficits and impairments:   ?Dysarthria and anarthria ? ?Dysphagia, unspecified type ? ?Cognitive communication deficit ? ? ? ?Problem List ?Patient Active Problem List  ? Diagnosis Date Noted  ? Vitamin D deficiency 07/10/2019  ? Vitamin B12 deficiency 07/10/2019  ? Hypothyroidism   ? Frequent falls   ? Osteoporosis   ? Closed fracture of right distal radius 12/25/2018  ? Parkinson disease (HCC) 10/20/2011  ? ? ?Guinn Delarosa, CCC-SLP ?09/07/2021, 12:07 AM ? ?[place] ?[facility] ?[address], [address] ?[place], [place], [address] ?Phone: [phone]   Fax:  [phone] ? ? ?Name: Aiman Sonn ?MRN: [id number] ?Date of Birth: 1950-09-11 ? ?

## 2021-09-07 NOTE — Progress Notes (Signed)
Kelly Howard ?[id number] ?07/11/1950 ?71 y.o. ? ?Subjective:  ? ?Patient ID:  Kelly Howard is a 71 y.o. (DOB 03/13/1951) female. ? ?Chief Complaint: No chief complaint on file. ? ? ?HPI ?Kelly Howard presents to the office today for follow-up of GAD, MDD, panic attacks, and insomnia. ? ?Accompanied by husband. ? ?Describes mood today as "ok". Tearful at times. Pleasant. Mood symptoms - reports depression, anxiety and irritability. Mother recently passed away at 72 years old.Stating "I miss my mom". Talked to her mother every day. Planning a celebration of life for her towards the end of the month. Feels like medications are helpful. Has a care giver and daughter to help her. Improved interest and motivation. Taking medications as prescribed. Diagnosed with Parkinson's in 2010. [facility] 6 months - taking Nuplazid for Parkinson's psychosis. ?Energy levels "ok". Active, does not have a regular exercise routine. 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 ?Enjoys some usual interests and activities. Married. Lives with husband of 46 years. Has 2 daughters - 3 grandchildren. Spending time with family. ?Appetite adequate. Weight stable- 117 pounds. ?Sleeps better some nights than others. Averages 5 hours. Napping during the day. Having urgency issues during the night. ?Focus and concentration difficulties. Completing tasks. Managing some aspects of household. Retired. ?Denies SI or HI.  ?Denies AH. ?Positive for VH - related to husband. ? ?Previous medication trials: Clonazepam, Zoloft, Paxil, Celexa ? ? ?PHQ2-9   ? ?Flowsheet Row [facility]  ?PHQ-2 Total Score 3 4 1  4 4   ?PHQ-9 Total Score 9 19 -- 7 8  ? ?  ? ?Flowsheet Row [facility]  ?C-SSRS RISK CATEGORY No Risk  ? ?  ?  ? ?Review of Systems:  ?Review of Systems  ?Musculoskeletal:  Negative for gait problem.  ?Neurological:  Negative for tremors.  ?Psychiatric/Behavioral:    ?     Please refer to HPI  ? ?Medications: I have reviewed the patient's current medications. ? ?Current Outpatient Medications  ?Medication Sig Dispense Refill  ? acetaminophen (TYLENOL) 325 MG tablet Take 325-650 mg by mouth every 6 (six) hours as needed for mild pain.    ? AMBULATORY NON FORMULARY MEDICATION Abdominal compression binder ?Dx: G20 1 Device 0  ? carbidopa-levodopa (SINEMET CR) 50-200 MG tablet TAKE 1 TABLET BY MOUTH EVERYDAY AT BEDTIME (Patient taking differently: Take 1 tablet by mouth at bedtime.) 90 tablet 1  ? carbidopa-levodopa (SINEMET IR) 25-100 MG tablet TAKE 1.5 TABLETS BY MOUTH 5 (FIVE) TIMES DAILY. 6am/9am/noon/3pm/5pm (Patient taking differently: Take 1.5 tablets by mouth See admin instructions. Take 1.5 tablets by mouth at 6 AM, 9 AM, 12 NOON, 3 PM, and 6 PM) 675 tablet 1  ? clonazePAM (KLONOPIN) 0.5 MG tablet Take 1 tablet (0.5 mg total) by mouth 3 (three) times daily as needed for anxiety. 90 tablet 2  ? ibandronate (BONIVA) 150 MG tablet Take 150 mg by mouth every 30 (thirty) days.    ? levothyroxine (SYNTHROID, LEVOTHROID) 50 MCG tablet Take 50 mcg by mouth daily before breakfast.    ? NUPLAZID 34 MG CAPS Take 1 capsule by mouth daily.    ? rivastigmine (EXELON) 4.6 mg/24hr 4.6 mg daily.    ?  sertraline (ZOLOFT) 100 MG tablet TAKE 1 TABLET BY MOUTH EVERY DAY 90 tablet 3  ? ?No current facility-administered medications for this visit.  ? ? ?Medication Side Effects: None ? ?Allergies:  ?Allergies  ?Allergen Reactions  ? Codeine Other (See Comments)  ?  GI Upset  ? Escitalopram Swelling and Other (See Comments)  ?  Feet swell  ? Propoxyphene Other (See Comments)  ?   Hallucinations  ? ? ?Past Medical History:  ?Diagnosis Date  ? Frequent falls   ? Hypothyroidism   ? Osteoporosis   ? Parkinson's disease (HCC)   ? ? ?Past Medical History, Surgical history, Social history, and Family history were reviewed and updated as appropriate.  ? ?Please see review of systems for further details on the patient's review from today.  ? ?Objective:  ? ?Physical Exam:  ?LMP  (LMP Unknown) Comment: gyn ? ?Physical Exam ?Constitutional:   ?   General: She is not in acute distress. ?Musculoskeletal:     ?   General: No deformity.  ?Neurological:  ?   Mental Status: She is alert and oriented to person, place, and time.  ?   Coordination: Coordination normal.  ?Psychiatric:     ?   Attention and Perception: Attention and perception normal. She does not perceive auditory or visual hallucinations.     ?   Mood and Affect: Mood normal. Mood is not anxious or depressed. Affect is not labile, blunt, angry or inappropriate.     ?   Speech: Speech normal.     ?   Behavior: Behavior normal.     ?   Thought Content: Thought content normal. Thought content is not paranoid or delusional. Thought content does not include homicidal or suicidal ideation. Thought content does not include homicidal or suicidal plan.     ?   Cognition and Memory: Cognition and memory normal.     ?   Judgment: Judgment normal.  ?   Comments: Insight intact  ? ? ?Lab Review:  ?   ?Component Value Date/Time  ? NA 138 04/19/2021 1507  ? K 4.2 04/19/2021 1507  ? CL 98 04/19/2021 1507  ? CO2 29 04/19/2021 1507  ? GLUCOSE 93 04/19/2021 1507  ? BUN 15 04/19/2021 1507  ? CREATININE 0.71 04/19/2021 1507  ? CALCIUM 9.9 04/19/2021 1507  ? PROT 7.4 04/19/2021 1507  ? ALBUMIN 4.9 04/19/2021 1507  ? AST 13 04/19/2021 1507  ? ALT 4 04/19/2021 1507  ? ALKPHOS 66 04/19/2021 1507  ? BILITOT 0.6 04/19/2021 1507  ? GFRNONAA >60 01/06/2021 1650  ? ? ?   ?Component Value Date/Time  ? WBC 5.7 04/19/2021 1507  ? RBC 4.09 04/19/2021 1507  ? HGB 11.9 (L)  04/19/2021 1507  ? HCT 35.7 (L) 04/19/2021 1507  ? PLT 283.0 04/19/2021 1507  ? MCV 87.3 04/19/2021 1507  ? MCH 29.4 01/06/2021 1650  ? MCHC 33.3 04/19/2021 1507  ? RDW 13.4 04/19/2021 1507  ? LYMPHSABS 0.6 (L) 04/19/2021 1507  ? MONOABS 0.5 04/19/2021 1507  ? EOSABS 0.0 04/19/2021 1507  ? BASOSABS 0.0 04/19/2021 1507  ? ? ?No results found for: POCLITH, LITHIUM  ? ?No results found for: PHENYTOIN, PHENOBARB, VALPROATE, CBMZ  ? ?.res ?Assessment: Plan:   ? ?Plan: ? ?PDMP reviewed ? ?Zoloft 100mg  daily ?Clonazepam 0.5mg  BID ? ?Also taking Nueplazid and Exelon patch for Parkinson's psychosis ? ?RTC 3 months ? ?Patient advised to contact office with any questions, adverse effects, or acute worsening in  signs and symptoms. ? ?Discussed potential benefits, risk, and side effects of benzodiazepines to include potential risk of tolerance and dependence, as well as possible drowsiness.  Advised patient not to drive if experiencing drowsiness and to take lowest possible effective dose to minimize risk of dependence and tolerance. ? ? ?Diagnoses and all orders for this visit: ? ?Generalized anxiety disorder ?-     clonazePAM (KLONOPIN) 0.5 MG tablet; Take 1 tablet (0.5 mg total) by mouth 3 (three) times daily as needed for anxiety. ? ?Insomnia, unspecified type ?-     clonazePAM (KLONOPIN) 0.5 MG tablet; Take 1 tablet (0.5 mg total) by mouth 3 (three) times daily as needed for anxiety. ? ?Panic attacks ?-     clonazePAM (KLONOPIN) 0.5 MG tablet; Take 1 tablet (0.5 mg total) by mouth 3 (three) times daily as needed for anxiety. ? ?Major depressive disorder, recurrent episode, moderate (HCC) ? ?  ? ?Please see After Visit Summary for patient specific instructions. ? ?Future Appointments  ?Date Time Provider Department Center  ?09/08/2021  8:00 AM Gean Maidens, PT [facility]  ?09/13/2021  8:00 AM Gean Maidens, PT [facility]  ?09/13/2021  8:45 AM Barron Alvine, CCC-SLP [facility]  ?09/15/2021  2:45 PM Gean Maidens, PT [facility]  ?09/15/2021  3:30 PM Barron Alvine, CCC-SLP [facility]  ?09/22/2021  2:00 PM Gean Maidens, PT [facility]  ?09/22/2021  2:45 PM Schinke, Karie Georges, CCC-SLP [facility]  ?4/28/

## 2021-09-08 ENCOUNTER — Ambulatory Visit: Payer: Medicare PPO | Admitting: Physical Therapy

## 2021-09-08 DIAGNOSIS — R471 Dysarthria and anarthria: Secondary | ICD-10-CM | POA: Diagnosis not present

## 2021-09-08 DIAGNOSIS — R1310 Dysphagia, unspecified: Secondary | ICD-10-CM | POA: Diagnosis not present

## 2021-09-08 DIAGNOSIS — M6281 Muscle weakness (generalized): Secondary | ICD-10-CM | POA: Diagnosis not present

## 2021-09-08 DIAGNOSIS — R2689 Other abnormalities of gait and mobility: Secondary | ICD-10-CM

## 2021-09-08 DIAGNOSIS — R293 Abnormal posture: Secondary | ICD-10-CM

## 2021-09-08 DIAGNOSIS — R41841 Cognitive communication deficit: Secondary | ICD-10-CM | POA: Diagnosis not present

## 2021-09-08 DIAGNOSIS — R2681 Unsteadiness on feet: Secondary | ICD-10-CM | POA: Diagnosis not present

## 2021-09-08 NOTE — Therapy (Signed)
[place] [facility] ?[address], [address] ?[place], [place], [address] ?Phone: [phone]   Fax:  [phone] ? ?Physical Therapy Treatment ? ?Patient Details  ?Name: Kelly Howard ?MRN: [id number] ?Date of Birth: 03-Sep-1950 ?Referring Provider (PT): Philip Aspen, MD ? ? ?Encounter Date: 09/08/2021 ? ? PT End of Session - 09/08/21 0806   ? ? Visit Number 4   ? Number of Visits 17   ? Date for PT Re-Evaluation 10/15/21   ? Authorization Type [employer]   ? Progress Note Due on Visit 10   ? PT Start Time [phone]   ? PT Stop Time 0844   ? PT Time Calculation (min) 40 min   ? Activity Tolerance Patient tolerated treatment well   ? Behavior During Therapy Restless;Flat affect;Anxious   does smile in response to breathing exercises at times.  ? ?  ?  ? ?  ? ? ?Past Medical History:  ?Diagnosis Date  ? Frequent falls   ? Hypothyroidism   ? Osteoporosis   ? Parkinson's disease (HCC)   ? ? ?Past Surgical History:  ?Procedure Laterality Date  ? TONSILLECTOMY    ? WRIST SURGERY    ? ? ?There were no vitals filed for this visit. ? ? Subjective Assessment - 09/08/21 1714   ? ? Subjective Went to see counselor yesterday; no changes in medications.  Just so worried about how to get through the funeral for my mom.   ? Pertinent History PMH:  GAD, panic attacks, osteoporosis, hypothyroidism, hx of UE fractures (R and L)   ? Patient Stated Goals Whatever we can do to help get better.  Would like to get back into exercises.   ? Currently in Pain? No/denies   ? ?  ?  ? ?  ? ? ? ? ? ? ? ? ? ? ? ? ? ? ? ? ? ? ? ? [facility] Adult PT Treatment/Exercise - 09/08/21 0001   ? ?  ? Transfers  ? Transfers Sit to Stand;Stand to Sit   ? Sit to Stand 5: Supervision;4: Min guard;From bed;With upper extremity assist   ? Stand to Sit 4: Min guard;With upper extremity assist;To bed   ? Comments Throughout session, needs cues for hand placement and for upright posture upon standing.   ?  ? Ambulation/Gait  ?  Ambulation/Gait Yes   ? Ambulation/Gait Assistance 4: Min guard   ? Ambulation Distance (Feet) 360 Feet   then 170  ? Assistive device Rollator   ? Gait Pattern Step-through pattern;Decreased arm swing - right;Decreased arm swing - left;Scissoring;Decreased dorsiflexion - right;Decreased dorsiflexion - left;Narrow base of support;Poor foot clearance - left;Poor foot clearance - right   ? Ambulation Surface Level;Indoor   ? Gait Comments Cues for increased foot clearance, L heestrike   ?  ? Knee/Hip Exercises: Aerobic  ? Nustep NuStep, Level 3, 4 extremities, x 8 minutes, for aerobic warm-up, consistent cues to keep steps/min >50.   ? ?  ?  ? ?  ? ? ? ?Seated deep breathing at edge of mat ? ? ?Pt performs PWR! Moves in seated position x 10 reps ?  ?PWR! Up for improved posture ? ?PWR! Rock for improved weighshifting ? ?PWR! Twist for improved trunk rotation  ? ?PWR! Step for improved step initiation  ? ?Visual, verbal, tactile cues provided for technique, amplitude, intensity ? ? ? ? ? ? ? ? PT Education - 09/08/21 0846   ? ? Education Details  PWR! Moves in sitting added to HEP   ? Person(s) Educated Patient   ? Methods Explanation;Demonstration;Handout;Verbal cues   ? Comprehension Verbalized understanding;Returned demonstration;Need further instruction   ? ?  ?  ? ?  ? ? ? PT Short Term Goals - 09/06/21 0858   ? ?  ? PT SHORT TERM GOAL #1  ? Title Pt will be independent with HEP to address balance, transfers, gait.  TARGET 09/17/2021   ? Time 5   ? Period Weeks   ? Status On-going   ?  ? PT SHORT TERM GOAL #2  ? Title Pt will perform sit<>stand at least 8 of 10 reps with no posterior lean, modified independently.   ? Time 5   ? Period Weeks   ? Status On-going   ?  ? PT SHORT TERM GOAL #3  ? Title Pt will improve posterior push and release to 2 steps or less for improved balance recovery.   ? Baseline would fall if unaided   ? Time 5   ? Period Weeks   ? Status On-going   ? ?  ?  ? ?  ? ? ? ? PT Long Term Goals -  09/06/21 0858   ? ?  ? PT LONG TERM GOAL #1  ? Title Pt will be independent with HEP for Parkinson's specific exercises, to address posture, transfers, balance, gait.  TARGET  10/15/2021   ? Time 9   ? Period Weeks   ? Status On-going   ?  ? PT LONG TERM GOAL #2  ? Title Pt will improve MiniBESTest score to at least 22/28 for decreased fall risk.   ? Baseline 17/28   ? Time 9   ? Period Weeks   ? Status On-going   ?  ? PT LONG TERM GOAL #3  ? Title Pt will improve TUG cognitive/TUG score to less than or equal to 10% difference for improved dual task with gait.   ? Time 9   ? Period Weeks   ? Status On-going   ?  ? PT LONG TERM GOAL #4  ? Title Pt will verbalize plans for ongoing community fitness upon d/c from PT.   ? Time 9   ? Period Weeks   ? Status On-going   ? ?  ?  ? ?  ? ? ? ? ? ? ? ? Plan - 09/08/21 1715   ? ? Clinical Impression Statement Continued skilled PT today with aerobic warm-up and PWR! Moves exercises in sitting to focus on larger movement patterns.  Also between sets, performed breathing exercises to help pt relax and attend to exercises.  Gait training with rollator, with pt needing cues occasionally for L foot clearance.  Pt seems to be moving with improved ease today and less episodes of hestiation with transitional movements or reaching for PT's assist, as she has in previous sessions.  She will continue to benefit from skilled PT towards goals for improved mobility and decreased fall risk.   ? Personal Factors and Comorbidities Comorbidity 3+;Other   hx of psycological issues-anxiety; husband reported memory loss/confusion  ? Comorbidities PMH:  GAD, panic attacks, osteoporosis, hypothyroidism, hx of UE fractures (R and L)   ? Examination-Activity Limitations Locomotion Level;Transfers;Squat;Stairs;Stand   ? Examination-Participation Restrictions Community Activity;Shop;Other   community fitness  ? Stability/Clinical Decision Making Evolving/Moderate complexity   ? Rehab Potential Good   ? PT  Frequency 2x / week   ? PT Duration Other (comment)  8 weeks, plus eval visit ; TOTAL POC = 9 weeks  ? PT Treatment/Interventions ADLs/Self Care Home Management;Gait training;Stair training;Functional mobility training;Therapeutic activities;Therapeutic exercise;Balance training;Neuromuscular re-education;DME Instruction;Manual techniques;Patient/family education   ? PT Next Visit Plan Review HEP for seated PWR! Moves.  Husband present for education if possible.  Continue to address posture, balance, gait training.  Try PWR! Moves in standing.  Gait training with slowed pace, attention to arm swing, heelstrike.  Use of aerobic machines.  Tips to reduce freezing/festination with gait.  Check STGs next week.   ? Consulted and Agree with Plan of Care Patient   ? ?  ?  ? ?  ? ? ?Patient will benefit from skilled therapeutic intervention in order to improve the following deficits and impairments:  Abnormal gait, Difficulty walking, Decreased balance, Postural dysfunction, Decreased strength, Decreased mobility ? ?Visit Diagnosis: ?Unsteadiness on feet ? ?Abnormal posture ? ?Other abnormalities of gait and mobility ? ? ? ? ?Problem List ?Patient Active Problem List  ? Diagnosis Date Noted  ? Vitamin D deficiency 07/10/2019  ? Vitamin B12 deficiency 07/10/2019  ? Hypothyroidism   ? Frequent falls   ? Osteoporosis   ? Closed fracture of right distal radius 12/25/2018  ? Parkinson disease (HCC) 10/20/2011  ? ? ?Gean Maidens., PT ?09/08/2021, 5:20 PM ? ?[place] [facility] ?[address], [address] ?[place], [place], [address] ?Phone: [phone]   Fax:  [phone] ? ?Name: Evolet Salminen ?MRN: [id number] ?Date of Birth: April 05, 1951 ? ? ? ?

## 2021-09-13 ENCOUNTER — Ambulatory Visit: Payer: Medicare PPO | Admitting: Physical Therapy

## 2021-09-13 ENCOUNTER — Ambulatory Visit: Payer: Medicare PPO

## 2021-09-15 ENCOUNTER — Ambulatory Visit: Payer: Medicare PPO | Admitting: Physical Therapy

## 2021-09-15 ENCOUNTER — Ambulatory Visit: Payer: Medicare PPO

## 2021-09-16 ENCOUNTER — Ambulatory Visit: Payer: Medicare PPO | Admitting: Psychiatry

## 2021-09-22 ENCOUNTER — Ambulatory Visit: Payer: Medicare PPO

## 2021-09-22 ENCOUNTER — Ambulatory Visit: Payer: Medicare PPO | Admitting: Physical Therapy

## 2021-09-24 ENCOUNTER — Ambulatory Visit: Payer: Medicare PPO | Admitting: Physical Therapy

## 2021-09-28 ENCOUNTER — Ambulatory Visit: Payer: Medicare PPO | Admitting: Physical Therapy

## 2021-09-28 DIAGNOSIS — R350 Frequency of micturition: Secondary | ICD-10-CM | POA: Diagnosis not present

## 2021-09-30 ENCOUNTER — Ambulatory Visit: Payer: Medicare PPO | Admitting: Physical Therapy

## 2021-10-05 DIAGNOSIS — R350 Frequency of micturition: Secondary | ICD-10-CM | POA: Diagnosis not present

## 2021-10-11 DIAGNOSIS — L219 Seborrheic dermatitis, unspecified: Secondary | ICD-10-CM | POA: Diagnosis not present

## 2021-10-11 DIAGNOSIS — D225 Melanocytic nevi of trunk: Secondary | ICD-10-CM | POA: Diagnosis not present

## 2021-10-11 DIAGNOSIS — L821 Other seborrheic keratosis: Secondary | ICD-10-CM | POA: Diagnosis not present

## 2021-10-11 DIAGNOSIS — Z85828 Personal history of other malignant neoplasm of skin: Secondary | ICD-10-CM | POA: Diagnosis not present

## 2021-10-11 DIAGNOSIS — L814 Other melanin hyperpigmentation: Secondary | ICD-10-CM | POA: Diagnosis not present

## 2021-10-11 DIAGNOSIS — L570 Actinic keratosis: Secondary | ICD-10-CM | POA: Diagnosis not present

## 2021-10-11 DIAGNOSIS — D2272 Melanocytic nevi of left lower limb, including hip: Secondary | ICD-10-CM | POA: Diagnosis not present

## 2021-10-12 DIAGNOSIS — R350 Frequency of micturition: Secondary | ICD-10-CM | POA: Diagnosis not present

## 2021-10-19 DIAGNOSIS — R350 Frequency of micturition: Secondary | ICD-10-CM | POA: Diagnosis not present

## 2021-10-26 DIAGNOSIS — H52203 Unspecified astigmatism, bilateral: Secondary | ICD-10-CM | POA: Diagnosis not present

## 2021-10-26 DIAGNOSIS — H2513 Age-related nuclear cataract, bilateral: Secondary | ICD-10-CM | POA: Diagnosis not present

## 2021-10-26 DIAGNOSIS — D3131 Benign neoplasm of right choroid: Secondary | ICD-10-CM | POA: Diagnosis not present

## 2021-10-26 DIAGNOSIS — R350 Frequency of micturition: Secondary | ICD-10-CM | POA: Diagnosis not present

## 2021-10-29 ENCOUNTER — Ambulatory Visit (INDEPENDENT_AMBULATORY_CARE_PROVIDER_SITE_OTHER): Payer: Medicare PPO

## 2021-10-29 DIAGNOSIS — R296 Repeated falls: Secondary | ICD-10-CM

## 2021-10-29 DIAGNOSIS — F419 Anxiety disorder, unspecified: Secondary | ICD-10-CM

## 2021-10-29 DIAGNOSIS — G20 Parkinson's disease: Secondary | ICD-10-CM

## 2021-10-29 DIAGNOSIS — M810 Age-related osteoporosis without current pathological fracture: Secondary | ICD-10-CM

## 2021-10-29 NOTE — Patient Instructions (Signed)
Visit Information  Thank you for taking time to visit with me today. Please don't hesitate to contact me if I can be of assistance to you before our next scheduled telephone appointment.  Following are the goals we discussed today:  Take all medications as prescribed Attend all scheduled provider appointments Call provider office for new concerns or questions  always use handrails on the stairs - always wear shoes or slippers with non-slip sole - get at least 10 minutes of activity every day - install bathroom grab bars - keep a flashlight by the bed - keep cell phone with me always - make an emergency alert plan in case I fall - pick up clutter from the floors - remove, or use a non-slip pad, with my throw rugs - use a cane or walker - use a nightlight in the bathroom  Our next appointment is by telephone on 01/21/22 at 11:30 AM  Please call the care guide team at [phone] if you need to cancel or reschedule your appointment.   If you are experiencing a Mental Health or Behavioral Health Crisis or need someone to talk to, please call the Suicide and Crisis Lifeline: 988 call the [place] National Suicide Prevention Lifeline: (304)733-6255 or TTY: 831-553-4256 TTY 612 330 8643) to talk to a trained counselor call 1-800-273-TALK (toll free, 24 hour hotline) go to [facility] [address], [place] [phone]) call 911   Patient verbalizes understanding of instructions and care plan provided today and agrees to view in MyChart. Active MyChart status and patient understanding of how to access instructions and care plan via MyChart confirmed with patient.     Dudley Major RN, Maximiano Coss, CDE Care Management Coordinator [facility] [phone]

## 2021-10-29 NOTE — Chronic Care Management (AMB) (Signed)
Chronic Care Management   CCM RN Visit Note  10/29/2021 Name: Kelly Howard MRN: [id number] DOB: [birth year]  Subjective: Kelly Howard is a 71 y.o. year old female who is a primary care patient of [name], Rayford Halsted, MD. The care management team was consulted for assistance with disease management and care coordination needs.    Engaged with patient by telephone for follow up visit in response to provider referral for case management and/or care coordination services.   Consent to Services:  The patient was given information about Chronic Care Management services, agreed to services, and gave verbal consent prior to initiation of services.  Please see initial visit note for detailed documentation.   Patient agreed to services and verbal consent obtained.   Assessment: Review of patient past medical history, allergies, medications, health status, including review of consultants reports, laboratory and other test data, was performed as part of comprehensive evaluation and provision of chronic care management services.   SDOH (Social Determinants of Health) assessments and interventions performed:    CCM Care Plan  Allergies  Allergen Reactions   Codeine Other (See Comments)    GI Upset   Escitalopram Swelling and Other (See Comments)    Feet swell   Propoxyphene Other (See Comments)    Hallucinations    Outpatient Encounter Medications as of 10/29/2021  Medication Sig   acetaminophen (TYLENOL) 325 MG tablet Take 325-650 mg by mouth every 6 (six) hours as needed for mild pain.   AMBULATORY NON FORMULARY MEDICATION Abdominal compression binder Dx: G20   carbidopa-levodopa (SINEMET CR) 50-200 MG tablet TAKE 1 TABLET BY MOUTH EVERYDAY AT BEDTIME (Patient taking differently: Take 1 tablet by mouth at bedtime.)   carbidopa-levodopa (SINEMET IR) 25-100 MG tablet TAKE 1.5 TABLETS BY MOUTH 5 (FIVE) TIMES DAILY. 6am/9am/noon/3pm/5pm (Patient taking differently: Take 1.5  tablets by mouth See admin instructions. Take 1.5 tablets by mouth at 6 AM, 9 AM, 12 NOON, 3 PM, and 6 PM)   clonazePAM (KLONOPIN) 0.5 MG tablet Take 1 tablet (0.5 mg total) by mouth 3 (three) times daily as needed for anxiety.   ibandronate (BONIVA) 150 MG tablet Take 150 mg by mouth every 30 (thirty) days.   levothyroxine (SYNTHROID, LEVOTHROID) 50 MCG tablet Take 50 mcg by mouth daily before breakfast.   NUPLAZID 34 MG CAPS Take 1 capsule by mouth daily.   rivastigmine (EXELON) 4.6 mg/24hr 4.6 mg daily.   sertraline (ZOLOFT) 100 MG tablet TAKE 1 TABLET BY MOUTH EVERY DAY   No facility-administered encounter medications on file as of 10/29/2021.    Patient Active Problem List   Diagnosis Date Noted   Vitamin D deficiency 07/10/2019   Vitamin B12 deficiency 07/10/2019   Hypothyroidism    Frequent falls    Osteoporosis    Closed fracture of right distal radius 12/25/2018   Parkinson disease ([place]) 10/20/2011    Conditions to be addressed/monitored:Osteoporosis and Parkinson disease, frequent falls  Care Plan : RN Care Manager Plan of Care  Updates made by Dimitri Ped, RN since 10/29/2021 12:00 AM     Problem: Chronic Disease Management and Care Coordination Needs(Parkinson Disease, Osteoporosis, Frequent Falls)   Priority: High     Long-Range Goal: Establish Plan of Care for Chronic Disease Management Needs (Parkinson Disease, Osteoporosis, Frequent Falls)   Start Date: 04/12/2021  Expected End Date: 10/29/2022  Recent Progress: On track  Priority: High  Note:   Current Barriers:  Chronic Disease Management support and education needs related to  Osteoporosis and Parkinson Disease, Frequent Falls Cognitive Deficits Spoke with [name]. States pt is to see  neurology in August. States she has been wearing the Exelon patch.  States pt has been stronger with less confusion/hallucinations and anxiety since she has been taking the Nuplazid.  States her urinary  frequency  is since she started the nerve stimulation treatments at urology   States he has been trying to give her foods with more calories and protein.    Denies any recent falls and she  is using the walker more.   Denies and choking or difficulty swallowing. States she coping with the death of her Mother as well as can be expected.  States she stopped her PT when her Mother passed away and states that it made her anxiety worse.  STates she might restart therapy after she completes her bladder treatments  RNCM Clinical Goal(s):  Patient will verbalize understanding of plan for management of Osteoporosis and Parkinson Disease, Frequent Falls as evidenced by voiced adherence to plan of care verbalize basic understanding of  Osteoporosis and Parkinson Disease, Frequent Falls disease process and self health management plan as evidenced by voiced understanding and teach back take all medications exactly as prescribed and will call provider for medication related questions as evidenced by dispense report and pt verbalization attend all scheduled medical appointments: Neurology 12/28/21 as evidenced by medical records demonstrate Improved adherence to prescribed treatment plan for Osteoporosis and Parkinson Disease, Frequent Falls as evidenced by reports fewer falls, adherence to plan of care continue to work with RN Care Manager to address care management and care coordination needs related to  Osteoporosis and Parkinson Disease, Frequent Falls as evidenced by adherence to CM Team Scheduled appointments through collaboration with RN Care manager, provider, and care team.   Interventions: 1:1 collaboration with primary care provider regarding development and update of comprehensive plan of care as evidenced by provider attestation and co-signature Inter-disciplinary care team collaboration (see longitudinal plan of care) Evaluation of current treatment plan related to  self management and patient's adherence  to plan as established by provider   Parkinson Disease Goal on track:  Yes Long Term Goal Evaluation of current treatment plan related to  Parkinson Disease,  , ADL IADL limitations and Cognitive Deficits self-management and patient's adherence to plan as established by provider. Discussed plans with patient for ongoing care management follow up and provided patient with direct contact information for care management team Evaluation of current treatment plan related to Parkinson Disease, and patient's adherence to plan as established by provider Provided education to patient re: Parkinson Disease,and to discuss sinus and secretion issues with neurologist Discussed plans with patient for ongoing care management follow up and provided patient with direct contact information for care management team Reinforced importance of good nutrition and to consider using nutritional supplements to help pt maintain or gain weight.  Encouraged to try to get back to going to gym for regular exercise   Falls Interventions:  (Status:  Goal on track:  Yes.) Long Term Goal Reviewed medications and discussed potential side effects of medications such as dizziness and frequent urination Advised patient of importance of notifying provider of falls Assessed for falls since last encounter Assessed patients knowledge of fall risk prevention secondary to previously provided education Reviewed to consider having grab bars installed in their shower to help pt feel more secure   Osteoporosis  (Status:  Goal on track:  Yes.)  Long Term Goal Evaluation of current treatment  plan related to Osteoporosis, ADL IADL limitations, Memory Deficits, Inability to perform ADL's independently, and Inability to perform IADL's independently self-management and patient's adherence to plan as established by provider. Discussed plans with patient for ongoing care management follow up and provided patient with direct contact information for  care management team Evaluation of current treatment plan related to Osteoporosis and patient's adherence to plan as established by provider Provided education to patient re: Osteoporosis Discussed plans with patient for ongoing care management follow up and provided patient with direct contact information for care management team Reinforced to take Vitamin D as directed and to try to get natural sunlight.      Patient Goals/Self-Care Activities: Take all medications as prescribed Attend all scheduled provider appointments Call provider office for new concerns or questions  - always use handrails on the stairs - always wear shoes or slippers with non-slip sole - get at least 10 minutes of activity every day - install bathroom grab bars - keep a flashlight by the bed - keep cell phone with me always - make an emergency alert plan in case I fall - pick up clutter from the floors - remove, or use a non-slip pad, with my throw rugs - use a cane or walker - use a nightlight in the bathroom Follow Up Plan:  Telephone follow up appointment with care management team member scheduled for:  01/21/22 The patient has been provided with contact information for the care management team and has been advised to call with any health related questions or concerns.         Plan:Telephone follow up appointment with care management team member scheduled for:  01/21/22 The patient has been provided with contact information for the care management team and has been advised to call with any health related questions or concerns.  Peter Garter RN, Jackquline Denmark, CDE Care Management Coordinator [facility] [phone]

## 2021-11-02 DIAGNOSIS — R350 Frequency of micturition: Secondary | ICD-10-CM | POA: Diagnosis not present

## 2021-11-09 ENCOUNTER — Encounter: Payer: Self-pay | Admitting: Adult Health

## 2021-11-09 ENCOUNTER — Ambulatory Visit (INDEPENDENT_AMBULATORY_CARE_PROVIDER_SITE_OTHER): Payer: Medicare PPO | Admitting: Adult Health

## 2021-11-09 DIAGNOSIS — R350 Frequency of micturition: Secondary | ICD-10-CM | POA: Diagnosis not present

## 2021-11-09 DIAGNOSIS — F411 Generalized anxiety disorder: Secondary | ICD-10-CM

## 2021-11-09 DIAGNOSIS — F410 Panic disorder [episodic paroxysmal anxiety] without agoraphobia: Secondary | ICD-10-CM | POA: Diagnosis not present

## 2021-11-09 DIAGNOSIS — G4700 Insomnia, unspecified: Secondary | ICD-10-CM

## 2021-11-09 MED ORDER — CLONAZEPAM 0.5 MG PO TABS: 0.5000 mg | ORAL_TABLET | Freq: Three times a day (TID) | ORAL | 2 refills | Status: DC | PRN

## 2021-11-09 NOTE — Progress Notes (Signed)
Kelly Howard [id number] [birth year] 71 y.o.  Subjective:   Patient ID:  Kelly Howard is a 71 y.o. (DOB 1951-01-07) female.  Chief Complaint: No chief complaint on file.   HPI Kelly Howard presents to the office today for follow-up of GAD, MDD, panic attacks, and insomnia.  Accompanied by husband.  Describes mood today as "ok". Tearful at times. Pleasant. Mood symptoms - reports some depression, anxiety - "fearful" and irritability. Mood is consistent. Stating "I'm worried about having to take more medications". Has a caregiver 2 to 3 days a week. Daughter also living with them. Diagnosed with Parkinson's in 2010. Followed by [facility] neurologist every 6 months - taking Nuplazid for Parkinson's psychosis. Improved interest and motivation. Taking medications as prescribed.Energy levels stable. Active, using exercise bike 3 times a week. Enjoys some usual interests and activities. Married. Lives with husband. Has 2 daughters - 3 grandchildren. Spending time with family. Appetite adequate. Weight stable- 117 pounds. Sleeps better some nights than others. Averages 5 hours. Napping during the day. Decreased urgency during the night. Focus and concentration difficulties. Completing tasks. Managing some aspects of household. Retired. Denies SI or HI.  Denies AH. Positive for VH.  Previous medication trials: Clonazepam, Zoloft, Paxil, Celexa   PHQ2-9    Flowsheet Row Office Visit from 08/10/2021 in [facility] [place] Office Visit from 04/19/2021 in [facility] [place] Chronic Care Management from 02/12/2021 in [facility] [place] Chronic Care Management from 08/27/2020 in [facility] [place] Office Visit from 08/05/2020 in [facility] [place]  PHQ-2 Total Score 3 4 1 4 4   PHQ-9 Total Score 9 19 -- 7 8      Flowsheet Row ED from 01/06/2021 in [facility]  C-SSRS RISK  CATEGORY No Risk        Review of Systems:  Review of Systems  Musculoskeletal:  Negative for gait problem.  Neurological:  Negative for tremors.  Psychiatric/Behavioral:         Please refer to HPI    Medications: I have reviewed the patient's current medications.  Current Outpatient Medications  Medication Sig Dispense Refill   acetaminophen (TYLENOL) 325 MG tablet Take 325-650 mg by mouth every 6 (six) hours as needed for mild pain.     AMBULATORY NON FORMULARY MEDICATION Abdominal compression binder Dx: G20 1 Device 0   carbidopa-levodopa (SINEMET CR) 50-200 MG tablet TAKE 1 TABLET BY MOUTH EVERYDAY AT BEDTIME (Patient taking differently: Take 1 tablet by mouth at bedtime.) 90 tablet 1   carbidopa-levodopa (SINEMET IR) 25-100 MG tablet TAKE 1.5 TABLETS BY MOUTH 5 (FIVE) TIMES DAILY. 6am/9am/noon/3pm/5pm (Patient taking differently: Take 1.5 tablets by mouth See admin instructions. Take 1.5 tablets by mouth at 6 AM, 9 AM, 12 NOON, 3 PM, and 6 PM) 675 tablet 1   clonazePAM (KLONOPIN) 0.5 MG tablet Take 1 tablet (0.5 mg total) by mouth 3 (three) times daily as needed for anxiety. 90 tablet 2   ibandronate (BONIVA) 150 MG tablet Take 150 mg by mouth every 30 (thirty) days.     levothyroxine (SYNTHROID, LEVOTHROID) 50 MCG tablet Take 50 mcg by mouth daily before breakfast.     NUPLAZID 34 MG CAPS Take 1 capsule by mouth daily.     rivastigmine (EXELON) 4.6 mg/24hr 4.6 mg daily.     sertraline (ZOLOFT) 100 MG tablet TAKE 1 TABLET BY MOUTH EVERY DAY 90 tablet 3   No current facility-administered medications for this visit.  Medication Side Effects: None  Allergies:  Allergies  Allergen Reactions   Codeine Other (See Comments)    GI Upset   Escitalopram Swelling and Other (See Comments)    Feet swell   Propoxyphene Other (See Comments)    Hallucinations    Past Medical History:  Diagnosis Date   Frequent falls    Hypothyroidism    Osteoporosis    Parkinson's disease  (HCC)     Past Medical History, Surgical history, Social history, and Family history were reviewed and updated as appropriate.   Please see review of systems for further details on the patient's review from today.   Objective:   Physical Exam:  LMP  (LMP Unknown) Comment: gyn  Physical Exam Constitutional:      General: She is not in acute distress. Musculoskeletal:        General: No deformity.  Neurological:     Mental Status: She is alert and oriented to person, place, and time.     Coordination: Coordination normal.  Psychiatric:        Attention and Perception: Attention and perception normal. She does not perceive auditory or visual hallucinations.        Mood and Affect: Mood normal. Mood is not anxious or depressed. Affect is not labile, blunt, angry or inappropriate.        Speech: Speech normal.        Behavior: Behavior normal.        Thought Content: Thought content normal. Thought content is not paranoid or delusional. Thought content does not include homicidal or suicidal ideation. Thought content does not include homicidal or suicidal plan.        Cognition and Memory: Cognition and memory normal.        Judgment: Judgment normal.     Comments: Insight intact     Lab Review:     Component Value Date/Time   NA 138 04/19/2021 1507   K 4.2 04/19/2021 1507   CL 98 04/19/2021 1507   CO2 29 04/19/2021 1507   GLUCOSE 93 04/19/2021 1507   BUN 15 04/19/2021 1507   CREATININE 0.71 04/19/2021 1507   CALCIUM 9.9 04/19/2021 1507   PROT 7.4 04/19/2021 1507   ALBUMIN 4.9 04/19/2021 1507   AST 13 04/19/2021 1507   ALT 4 04/19/2021 1507   ALKPHOS 66 04/19/2021 1507   BILITOT 0.6 04/19/2021 1507   GFRNONAA >60 01/06/2021 1650       Component Value Date/Time   WBC 5.7 04/19/2021 1507   RBC 4.09 04/19/2021 1507   HGB 11.9 (L) 04/19/2021 1507   HCT 35.7 (L) 04/19/2021 1507   PLT 283.0 04/19/2021 1507   MCV 87.3 04/19/2021 1507   MCH 29.4 01/06/2021 1650   MCHC  33.3 04/19/2021 1507   RDW 13.4 04/19/2021 1507   LYMPHSABS 0.6 (L) 04/19/2021 1507   MONOABS 0.5 04/19/2021 1507   EOSABS 0.0 04/19/2021 1507   BASOSABS 0.0 04/19/2021 1507    No results found for: "POCLITH", "LITHIUM"   No results found for: "PHENYTOIN", "PHENOBARB", "VALPROATE", "CBMZ"   .res Assessment: Plan:    Plan:  PDMP reviewed  Zoloft 100mg  daily Clonazepam 0.5mg  BID  Also taking Nueplazid and Exelon patch for Parkinson's psychosis  RTC 3 months   Time spent with patient was 20 minutes. Greater than 50% of face to face time with patient was spent on counseling and coordination of care.    Patient advised to contact office with any questions, adverse effects, or  acute worsening in signs and symptoms.  Discussed potential benefits, risk, and side effects of benzodiazepines to include potential risk of tolerance and dependence, as well as possible drowsiness.  Advised patient not to drive if experiencing drowsiness and to take lowest possible effective dose to minimize risk of dependence and tolerance. Diagnoses and all orders for this visit:  Generalized anxiety disorder -     clonazePAM (KLONOPIN) 0.5 MG tablet; Take 1 tablet (0.5 mg total) by mouth 3 (three) times daily as needed for anxiety.  Insomnia, unspecified type -     clonazePAM (KLONOPIN) 0.5 MG tablet; Take 1 tablet (0.5 mg total) by mouth 3 (three) times daily as needed for anxiety.  Panic attacks -     clonazePAM (KLONOPIN) 0.5 MG tablet; Take 1 tablet (0.5 mg total) by mouth 3 (three) times daily as needed for anxiety.     Please see After Visit Summary for patient specific instructions.  Future Appointments  Date Time Provider Department Center  01/21/2022 11:30 AM [facility]    No orders of the defined types were placed in this encounter.   -------------------------------

## 2021-11-16 DIAGNOSIS — R350 Frequency of micturition: Secondary | ICD-10-CM | POA: Diagnosis not present

## 2021-11-23 DIAGNOSIS — R350 Frequency of micturition: Secondary | ICD-10-CM | POA: Diagnosis not present

## 2021-11-24 DIAGNOSIS — R350 Frequency of micturition: Secondary | ICD-10-CM | POA: Diagnosis not present

## 2021-11-26 DIAGNOSIS — G20 Parkinson's disease: Secondary | ICD-10-CM | POA: Diagnosis not present

## 2021-11-26 DIAGNOSIS — M810 Age-related osteoporosis without current pathological fracture: Secondary | ICD-10-CM

## 2021-12-02 DIAGNOSIS — F02B3 Dementia in other diseases classified elsewhere, moderate, with mood disturbance: Secondary | ICD-10-CM | POA: Diagnosis not present

## 2021-12-02 DIAGNOSIS — G20 Parkinson's disease: Secondary | ICD-10-CM | POA: Diagnosis not present

## 2021-12-02 DIAGNOSIS — Z79899 Other long term (current) drug therapy: Secondary | ICD-10-CM | POA: Diagnosis not present

## 2021-12-07 DIAGNOSIS — R350 Frequency of micturition: Secondary | ICD-10-CM | POA: Diagnosis not present

## 2021-12-14 DIAGNOSIS — R350 Frequency of micturition: Secondary | ICD-10-CM | POA: Diagnosis not present

## 2021-12-21 ENCOUNTER — Ambulatory Visit: Payer: Self-pay

## 2021-12-21 ENCOUNTER — Other Ambulatory Visit: Payer: Self-pay

## 2021-12-21 DIAGNOSIS — G20 Parkinson's disease: Secondary | ICD-10-CM

## 2021-12-21 DIAGNOSIS — M810 Age-related osteoporosis without current pathological fracture: Secondary | ICD-10-CM

## 2021-12-21 DIAGNOSIS — R350 Frequency of micturition: Secondary | ICD-10-CM | POA: Diagnosis not present

## 2021-12-21 NOTE — Patient Instructions (Signed)
Visit Information Case closed goals met Thank you for allowing me to share the care management and care coordination services that are available to you as part of your health plan and services through your primary care provider and medical home. Please reach out to me at [phone] if the care management/care coordination team may be of assistance to you in the future.   Meghen Akopyan RN, BSN,CCM, CDE Care Management Coordinator [facility] [phone]   

## 2021-12-21 NOTE — Chronic Care Management (AMB) (Signed)
Chronic Care Management   CCM RN Visit Note  12/21/2021 Name: Kelly Howard MRN: [id number] DOB: 1950-06-10  Subjective: Kelly Howard is a 71 y.o. year old female who is a primary care patient of [name], Rayford Halsted, MD. The care management team was consulted for assistance with disease management and care coordination needs.    Engaged with patient by telephone for  Case closed goals met  in response to provider referral for case management and/or care coordination services.   Consent to Services:  The patient was given information about Chronic Care Management services, agreed to services, and gave verbal consent prior to initiation of services.  Please see initial visit note for detailed documentation.   Patient agreed to services and verbal consent obtained.   Assessment: Review of patient past medical history, allergies, medications, health status, including review of consultants reports, laboratory and other test data, was performed as part of comprehensive evaluation and provision of chronic care management services.   SDOH (Social Determinants of Health) assessments and interventions performed:    CCM Care Plan  Allergies  Allergen Reactions   Codeine Other (See Comments)    GI Upset   Escitalopram Swelling and Other (See Comments)    Feet swell   Propoxyphene Other (See Comments)    Hallucinations    Outpatient Encounter Medications as of 12/21/2021  Medication Sig   acetaminophen (TYLENOL) 325 MG tablet Take 325-650 mg by mouth every 6 (six) hours as needed for mild pain.   AMBULATORY NON FORMULARY MEDICATION Abdominal compression binder Dx: G20   carbidopa-levodopa (SINEMET CR) 50-200 MG tablet TAKE 1 TABLET BY MOUTH EVERYDAY AT BEDTIME (Patient taking differently: Take 1 tablet by mouth at bedtime.)   carbidopa-levodopa (SINEMET IR) 25-100 MG tablet TAKE 1.5 TABLETS BY MOUTH 5 (FIVE) TIMES DAILY. 6am/9am/noon/3pm/5pm (Patient taking differently:  Take 1.5 tablets by mouth See admin instructions. Take 1.5 tablets by mouth at 6 AM, 9 AM, 12 NOON, 3 PM, and 6 PM)   clonazePAM (KLONOPIN) 0.5 MG tablet Take 1 tablet (0.5 mg total) by mouth 3 (three) times daily as needed for anxiety.   ibandronate (BONIVA) 150 MG tablet Take 150 mg by mouth every 30 (thirty) days.   levothyroxine (SYNTHROID, LEVOTHROID) 50 MCG tablet Take 50 mcg by mouth daily before breakfast.   NUPLAZID 34 MG CAPS Take 1 capsule by mouth daily.   rivastigmine (EXELON) 4.6 mg/24hr 4.6 mg daily.   sertraline (ZOLOFT) 100 MG tablet TAKE 1 TABLET BY MOUTH EVERY DAY   No facility-administered encounter medications on file as of 12/21/2021.    Patient Active Problem List   Diagnosis Date Noted   Vitamin D deficiency 07/10/2019   Vitamin B12 deficiency 07/10/2019   Hypothyroidism    Frequent falls    Osteoporosis    Closed fracture of right distal radius 12/25/2018   Parkinson disease ([place]) 10/20/2011    Conditions to be addressed/monitored:Osteoporosis and Parkinson's disease  Care Plan : RN Care Manager Plan of Care  Updates made by Dimitri Ped, RN since 12/21/2021 12:00 AM  Completed 12/21/2021   Problem: Chronic Disease Management and Care Coordination Needs(Parkinson Disease, Osteoporosis, Frequent Falls) Resolved 12/21/2021  Priority: High     Long-Range Goal: Establish Plan of Care for Chronic Disease Management Needs (Parkinson Disease, Osteoporosis, Frequent Falls) Completed 12/21/2021  Start Date: 04/12/2021  Expected End Date: 10/29/2022  Recent Progress: On track  Priority: High  Note:   Case closed goals met Current Barriers:  Chronic  Disease Management support and education needs related to Osteoporosis and Parkinson Disease, Frequent Falls Cognitive Deficits Spoke with [name]. States pt is to see  neurology in August. States she has been wearing the Exelon patch.  States pt has been stronger with less confusion/hallucinations and  anxiety since she has been taking the Nuplazid.  States her urinary frequency  is since she started the nerve stimulation treatments at urology   States he has been trying to give her foods with more calories and protein.    Denies any recent falls and she  is using the walker more.   Denies and choking or difficulty swallowing. States she coping with the death of her Mother as well as can be expected.  States she stopped her PT when her Mother passed away and states that it made her anxiety worse.  STates she might restart therapy after she completes her bladder treatments  RNCM Clinical Goal(s):  Patient will verbalize understanding of plan for management of Osteoporosis and Parkinson Disease, Frequent Falls as evidenced by voiced adherence to plan of care verbalize basic understanding of  Osteoporosis and Parkinson Disease, Frequent Falls disease process and self health management plan as evidenced by voiced understanding and teach back take all medications exactly as prescribed and will call provider for medication related questions as evidenced by dispense report and pt verbalization attend all scheduled medical appointments: Neurology 12/28/21 as evidenced by medical records demonstrate Improved adherence to prescribed treatment plan for Osteoporosis and Parkinson Disease, Frequent Falls as evidenced by reports fewer falls, adherence to plan of care continue to work with RN Care Manager to address care management and care coordination needs related to  Osteoporosis and Parkinson Disease, Frequent Falls as evidenced by adherence to CM Team Scheduled appointments through collaboration with RN Care manager, provider, and care team.   Interventions: 1:1 collaboration with primary care provider regarding development and update of comprehensive plan of care as evidenced by provider attestation and co-signature Inter-disciplinary care team collaboration (see longitudinal plan of care) Evaluation of current  treatment plan related to  self management and patient's adherence to plan as established by provider   Parkinson Disease Goal on track:  Yes Long Term Goal Evaluation of current treatment plan related to  Parkinson Disease,  , ADL IADL limitations and Cognitive Deficits self-management and patient's adherence to plan as established by provider. Discussed plans with patient for ongoing care management follow up and provided patient with direct contact information for care management team Evaluation of current treatment plan related to Parkinson Disease, and patient's adherence to plan as established by provider Provided education to patient re: Parkinson Disease,and to discuss sinus and secretion issues with neurologist Discussed plans with patient for ongoing care management follow up and provided patient with direct contact information for care management team Reinforced importance of good nutrition and to consider using nutritional supplements to help pt maintain or gain weight.  Encouraged to try to get back to going to gym for regular exercise   Falls Interventions:  (Status:  Goal Met.) Long Term Goal Reviewed medications and discussed potential side effects of medications such as dizziness and frequent urination Advised patient of importance of notifying provider of falls Assessed for falls since last encounter Assessed patients knowledge of fall risk prevention secondary to previously provided education Reviewed to consider having grab bars installed in their shower to help pt feel more secure   Osteoporosis  (Status:  Goal Met.)  Long Term Goal Evaluation of  current treatment plan related to Osteoporosis, ADL IADL limitations, Memory Deficits, Inability to perform ADL's independently, and Inability to perform IADL's independently self-management and patient's adherence to plan as established by provider. Discussed plans with patient for ongoing care management follow up and provided  patient with direct contact information for care management team Evaluation of current treatment plan related to Osteoporosis and patient's adherence to plan as established by provider Provided education to patient re: Osteoporosis Discussed plans with patient for ongoing care management follow up and provided patient with direct contact information for care management team Reinforced to take Vitamin D as directed and to try to get natural sunlight.      Patient Goals/Self-Care Activities: Take all medications as prescribed Attend all scheduled provider appointments Call provider office for new concerns or questions  - always use handrails on the stairs - always wear shoes or slippers with non-slip sole - get at least 10 minutes of activity every day - install bathroom grab bars - keep a flashlight by the bed - keep cell phone with me always - make an emergency alert plan in case I fall - pick up clutter from the floors - remove, or use a non-slip pad, with my throw rugs - use a cane or walker - use a nightlight in the bathroom Follow Up Plan:  The patient has been provided with contact information for the care management team and has been advised to call with any health related questions or concerns.  No further follow up required: Case closed goals met         Plan:The patient has been provided with contact information for the care management team and has been advised to call with any health related questions or concerns.  No further follow up required: Case closed goals met Peter Garter RN, [facility], CDE Care Management Coordinator [facility] [phone]

## 2021-12-29 DIAGNOSIS — Z79899 Other long term (current) drug therapy: Secondary | ICD-10-CM | POA: Diagnosis not present

## 2021-12-29 DIAGNOSIS — F0284 Dementia in other diseases classified elsewhere, unspecified severity, with anxiety: Secondary | ICD-10-CM | POA: Diagnosis not present

## 2021-12-29 DIAGNOSIS — G20 Parkinson's disease: Secondary | ICD-10-CM | POA: Diagnosis not present

## 2022-01-03 ENCOUNTER — Telehealth: Payer: Self-pay | Admitting: Adult Health

## 2022-01-03 NOTE — Telephone Encounter (Signed)
Let's just leave things as they are for now. We can discuss changes - if needed at upcoming appt. Opefully she want need an increase.

## 2022-01-03 NOTE — Telephone Encounter (Signed)
Husband lvm that he needs to talk to gina about debbie's klonopin and zoloft. The neurologist asked him to call and ask some questions. Please call joseph at [phone]

## 2022-01-03 NOTE — Telephone Encounter (Signed)
Spoke with husband in regards to message left today. Patient sees Dr. Earna Coder Ward, neurologist in [facility]. Saw Dr. Elesa Massed on 8/2 and he discontinued patient's clonazepam. Husband said you had wanted to do it previously but patient was not able to come off of it earlier. Husband said you mentioned increasing sertraline when she weaned off the clonazepam and is asking if you wanted to increase it from the current 100 mg now. Patient has an appt 9/13 and husband said they could wait to change if you prefer. Patient was put on trazodone in place of the clonazepam and husband said that seems to be working well for her.

## 2022-01-03 NOTE — Telephone Encounter (Signed)
Husband notified  

## 2022-01-05 DIAGNOSIS — Z8262 Family history of osteoporosis: Secondary | ICD-10-CM | POA: Diagnosis not present

## 2022-01-05 DIAGNOSIS — Z779 Other contact with and (suspected) exposures hazardous to health: Secondary | ICD-10-CM | POA: Diagnosis not present

## 2022-01-05 DIAGNOSIS — Z681 Body mass index (BMI) 19 or less, adult: Secondary | ICD-10-CM | POA: Diagnosis not present

## 2022-01-05 DIAGNOSIS — N958 Other specified menopausal and perimenopausal disorders: Secondary | ICD-10-CM | POA: Diagnosis not present

## 2022-01-05 DIAGNOSIS — Z124 Encounter for screening for malignant neoplasm of cervix: Secondary | ICD-10-CM | POA: Diagnosis not present

## 2022-01-05 DIAGNOSIS — M816 Localized osteoporosis [Lequesne]: Secondary | ICD-10-CM | POA: Diagnosis not present

## 2022-01-05 DIAGNOSIS — E039 Hypothyroidism, unspecified: Secondary | ICD-10-CM | POA: Diagnosis not present

## 2022-01-05 DIAGNOSIS — R29890 Loss of height: Secondary | ICD-10-CM | POA: Diagnosis not present

## 2022-01-05 DIAGNOSIS — Z1231 Encounter for screening mammogram for malignant neoplasm of breast: Secondary | ICD-10-CM | POA: Diagnosis not present

## 2022-01-05 LAB — HM MAMMOGRAPHY

## 2022-01-06 LAB — HM PAP SMEAR

## 2022-01-17 ENCOUNTER — Encounter: Payer: Self-pay | Admitting: Physical Therapy

## 2022-01-17 NOTE — Therapy (Signed)
[facility] [address], [place], [place], [address] Phone: [phone]   Fax:  [phone]  Patient Details  Name: Kelly Howard MRN: [id number] Date of Birth: 1951-04-03 Referring Provider:  No ref. provider found  Encounter Date: 01/17/2022  PHYSICAL THERAPY DISCHARGE SUMMARY  Visits from Start of Care: 4  Current functional level related to goals / functional outcomes: See eval-unable to fully assess, due to pt not returning since 4th visit   Remaining deficits: See eval-strength, balance, gait   Education / Equipment: Initiated HEP, but unable to be fully addressed, due to pt not returning to PT.   Patient agrees to discharge. Patient goals were not met. Patient is being discharged due to not returning since the last visit.   Taevon Aschoff W., PT 01/17/2022, 4:04 PM  [facility] [address], [place], [place], [address] Phone: [phone]   Fax:  [phone]

## 2022-01-18 DIAGNOSIS — R350 Frequency of micturition: Secondary | ICD-10-CM | POA: Diagnosis not present

## 2022-01-21 ENCOUNTER — Telehealth: Payer: Medicare PPO

## 2022-01-21 DIAGNOSIS — M810 Age-related osteoporosis without current pathological fracture: Secondary | ICD-10-CM | POA: Diagnosis not present

## 2022-01-28 DIAGNOSIS — M810 Age-related osteoporosis without current pathological fracture: Secondary | ICD-10-CM | POA: Diagnosis not present

## 2022-02-09 ENCOUNTER — Ambulatory Visit (INDEPENDENT_AMBULATORY_CARE_PROVIDER_SITE_OTHER): Payer: Medicare PPO | Admitting: Adult Health

## 2022-02-09 ENCOUNTER — Encounter: Payer: Self-pay | Admitting: Adult Health

## 2022-02-09 DIAGNOSIS — F411 Generalized anxiety disorder: Secondary | ICD-10-CM

## 2022-02-09 DIAGNOSIS — F331 Major depressive disorder, recurrent, moderate: Secondary | ICD-10-CM | POA: Diagnosis not present

## 2022-02-09 MED ORDER — TRAZODONE HCL 50 MG PO TABS: ORAL_TABLET | ORAL | 3 refills | Status: DC

## 2022-02-09 NOTE — Progress Notes (Signed)
Kelly Howard [id number] 02-22-1951 71 y.o.  Subjective:   Patient ID:  Kelly Howard is a 71 y.o. (DOB 06-Oct-1950) female.  Chief Complaint: No chief complaint on file.   HPI Kelly Howard presents to the office today for follow-up of GAD, MDD, panic attacks, and insomnia.  Accompanied by husband.  Describes mood today as "ok". Tearful at times. Pleasant. Mood symptoms - reports increased depression, anxiety and irritability at times. Reports some worry and rumination. Mood is variable. Stating "I feel like I'm doing better". Husband reports recent medications - Clonazepam discontinued and Trazadone added. Has continued to take Zoloft at 100mg .  Has a caregiver 30 hours a week. Daughter also living with them. Diagnosed with Parkinson's in 2010 - followed by [facility]. Improved interest and motivation. Recent physical with PCP. Taking medications as prescribed. Energy levels improved. Active, using exercise bike 3 times a week. Enjoys some usual interests and activities. Married. Lives with husband. Has 2 daughters - 3 grandchildren. Spending time with family. Appetite adequate. Weight stable - 117 pounds. Sleeps better some nights than others. Averages 5 hours then up and down. Denies napping  Focus and concentration difficulties. Completing tasks. Managing some aspects of household. Retired. Denies SI or HI.  Decreased AH. Positive for VH - decreased.  Previous medication trials: Clonazepam, Zoloft, Paxil, Celexa    PHQ2-9    Flowsheet Row Office Visit from 08/10/2021 in [facility] [place] Office Visit from 04/19/2021 in [facility] [place] Chronic Care Management from 02/12/2021 in [facility] [place] Chronic Care Management from 08/27/2020 in [facility] [place] Office Visit from 08/05/2020 in [facility] [place]  PHQ-2 Total Score 3 4 1 4 4   PHQ-9 Total Score 9 19 -- 7 8       Flowsheet Row ED from 01/06/2021 in [facility]  C-SSRS RISK CATEGORY No Risk        Review of Systems:  Review of Systems  Musculoskeletal:  Negative for gait problem.  Neurological:  Negative for tremors.  Psychiatric/Behavioral:         Please refer to HPI    Medications: I have reviewed the patient's current medications.  Current Outpatient Medications  Medication Sig Dispense Refill   acetaminophen (TYLENOL) 325 MG tablet Take 325-650 mg by mouth every 6 (six) hours as needed for mild pain.     AMBULATORY NON FORMULARY MEDICATION Abdominal compression binder Dx: G20 1 Device 0   carbidopa-levodopa (SINEMET CR) 50-200 MG tablet TAKE 1 TABLET BY MOUTH EVERYDAY AT BEDTIME (Patient taking differently: Take 1 tablet by mouth at bedtime.) 90 tablet 1   carbidopa-levodopa (SINEMET IR) 25-100 MG tablet TAKE 1.5 TABLETS BY MOUTH 5 (FIVE) TIMES DAILY. 6am/9am/noon/3pm/5pm (Patient taking differently: Take 1.5 tablets by mouth See admin instructions. Take 1.5 tablets by mouth at 6 AM, 9 AM, 12 NOON, 3 PM, and 6 PM) 675 tablet 1   clonazePAM (KLONOPIN) 0.5 MG tablet Take 1 tablet (0.5 mg total) by mouth 3 (three) times daily as needed for anxiety. 90 tablet 2   ibandronate (BONIVA) 150 MG tablet Take 150 mg by mouth every 30 (thirty) days.     levothyroxine (SYNTHROID, LEVOTHROID) 50 MCG tablet Take 50 mcg by mouth daily before breakfast.     NUPLAZID 34 MG CAPS Take 1 capsule by mouth daily.     rivastigmine (EXELON) 4.6 mg/24hr 4.6 mg daily.     sertraline (ZOLOFT) 100 MG tablet TAKE 1  TABLET BY MOUTH EVERY DAY 90 tablet 3   No current facility-administered medications for this visit.    Medication Side Effects: None  Allergies:  Allergies  Allergen Reactions   Codeine Other (See Comments)    GI Upset   Escitalopram Swelling and Other (See Comments)    Feet swell   Propoxyphene Other (See Comments)    Hallucinations    Past Medical  History:  Diagnosis Date   Frequent falls    Hypothyroidism    Osteoporosis    Parkinson's disease (HCC)     Past Medical History, Surgical history, Social history, and Family history were reviewed and updated as appropriate.   Please see review of systems for further details on the patient's review from today.   Objective:   Physical Exam:  LMP  (LMP Unknown) Comment: gyn  Physical Exam Constitutional:      General: She is not in acute distress. Musculoskeletal:        General: No deformity.  Neurological:     Mental Status: She is alert and oriented to person, place, and time.     Coordination: Coordination normal.  Psychiatric:        Attention and Perception: Attention and perception normal. She does not perceive auditory or visual hallucinations.        Mood and Affect: Mood normal. Mood is not anxious or depressed. Affect is not labile, blunt, angry or inappropriate.        Speech: Speech normal.        Behavior: Behavior normal.        Thought Content: Thought content normal. Thought content is not paranoid or delusional. Thought content does not include homicidal or suicidal ideation. Thought content does not include homicidal or suicidal plan.        Cognition and Memory: Cognition and memory normal.        Judgment: Judgment normal.     Comments: Insight intact     Lab Review:     Component Value Date/Time   NA 138 04/19/2021 1507   K 4.2 04/19/2021 1507   CL 98 04/19/2021 1507   CO2 29 04/19/2021 1507   GLUCOSE 93 04/19/2021 1507   BUN 15 04/19/2021 1507   CREATININE 0.71 04/19/2021 1507   CALCIUM 9.9 04/19/2021 1507   PROT 7.4 04/19/2021 1507   ALBUMIN 4.9 04/19/2021 1507   AST 13 04/19/2021 1507   ALT 4 04/19/2021 1507   ALKPHOS 66 04/19/2021 1507   BILITOT 0.6 04/19/2021 1507   GFRNONAA >60 01/06/2021 1650       Component Value Date/Time   WBC 5.7 04/19/2021 1507   RBC 4.09 04/19/2021 1507   HGB 11.9 (L) 04/19/2021 1507   HCT 35.7 (L)  04/19/2021 1507   PLT 283.0 04/19/2021 1507   MCV 87.3 04/19/2021 1507   MCH 29.4 01/06/2021 1650   MCHC 33.3 04/19/2021 1507   RDW 13.4 04/19/2021 1507   LYMPHSABS 0.6 (L) 04/19/2021 1507   MONOABS 0.5 04/19/2021 1507   EOSABS 0.0 04/19/2021 1507   BASOSABS 0.0 04/19/2021 1507    No results found for: "POCLITH", "LITHIUM"   No results found for: "PHENYTOIN", "PHENOBARB", "VALPROATE", "CBMZ"   .res Assessment: Plan:    Plan:  PDMP reviewed  Zoloft 100mg  daily Trazadone 50mg  - TAKE 1 to 2 at hs  D/C Clonazepam  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 Greater than 50% of  face to face time with patient was spent on counseling and coordination of care.    Patient advised to contact office with any questions, adverse effects, or acute worsening in signs and symptoms.  Discussed potential benefits, risk, and side effects of benzodiazepines to include potential risk of tolerance and dependence, as well as possible drowsiness.  Advised patient not to drive if experiencing drowsiness and to take lowest possible effective dose to minimize risk of dependence and tolerance.  There are no diagnoses linked to this encounter.   Please see After Visit Summary for patient specific instructions.  Future Appointments  Date Time Provider Department Center  02/09/2022  1:20 PM Wes Lezotte, Thereasa Solo, NP CP-CP None    No orders of the defined types were placed in this encounter.   -------------------------------

## 2022-02-15 DIAGNOSIS — R350 Frequency of micturition: Secondary | ICD-10-CM | POA: Diagnosis not present

## 2022-02-24 ENCOUNTER — Other Ambulatory Visit: Payer: Self-pay | Admitting: Adult Health

## 2022-02-24 DIAGNOSIS — F331 Major depressive disorder, recurrent, moderate: Secondary | ICD-10-CM

## 2022-02-24 DIAGNOSIS — F411 Generalized anxiety disorder: Secondary | ICD-10-CM

## 2022-02-28 DIAGNOSIS — R350 Frequency of micturition: Secondary | ICD-10-CM | POA: Diagnosis not present

## 2022-03-03 DIAGNOSIS — H1131 Conjunctival hemorrhage, right eye: Secondary | ICD-10-CM | POA: Diagnosis not present

## 2022-03-03 DIAGNOSIS — H2513 Age-related nuclear cataract, bilateral: Secondary | ICD-10-CM | POA: Diagnosis not present

## 2022-03-15 DIAGNOSIS — R350 Frequency of micturition: Secondary | ICD-10-CM | POA: Diagnosis not present

## 2022-04-12 DIAGNOSIS — R350 Frequency of micturition: Secondary | ICD-10-CM | POA: Diagnosis not present

## 2022-04-18 ENCOUNTER — Telehealth: Payer: Self-pay

## 2022-04-18 NOTE — Telephone Encounter (Signed)
--  Caller states she is having loose stools for two days and not feeling well for about three weeks. Nausea and runny nose.  04/15/2022 11:30:24 AM Home Care Donadeo, RN, [facility] Advice Given Per Guideline HOME CARE: * You should be able to treat this at home. REASSURANCE AND EDUCATION - DIARRHEA: * Diarrhea may be caused by a virus ('stomach flu') or a bacteria. Diarrhea is one of the body's way of getting rid of germs. DIARRHEA MEDICINE - BISMUTH SUBSALICYLATE (E.G., PEPTO-BISMOL): * This medicine can help reduce diarrhea, vomiting, and abdominal cramping. It is available over-the-counter (OTC) in a drugstore. * Adult dosage: Take two Pepto-Bismol caplets or tablets, or take two tablespoons (30 ml total) of Pepto-Bismol original strength liquid, by mouth every hour (if diarrhea continues) to a maximum of 8 doses in a 24 hour period. * Do not use for more than 2 days. CALL BACK IF: * Signs of dehydration occur (e.g., no urine over 12 hours, very dry mouth, lightheaded, etc.) * Diarrhea lasts over 7 days * You become worse CARE ADVICE given per Diarrhea (Adult) guideline

## 2022-04-24 DIAGNOSIS — J40 Bronchitis, not specified as acute or chronic: Secondary | ICD-10-CM | POA: Diagnosis not present

## 2022-04-24 DIAGNOSIS — R0981 Nasal congestion: Secondary | ICD-10-CM | POA: Diagnosis not present

## 2022-04-24 DIAGNOSIS — J029 Acute pharyngitis, unspecified: Secondary | ICD-10-CM | POA: Diagnosis not present

## 2022-04-24 DIAGNOSIS — J329 Chronic sinusitis, unspecified: Secondary | ICD-10-CM | POA: Diagnosis not present

## 2022-04-26 DIAGNOSIS — G20A2 Parkinson's disease without dyskinesia, with fluctuations: Secondary | ICD-10-CM | POA: Diagnosis not present

## 2022-04-26 DIAGNOSIS — Z79899 Other long term (current) drug therapy: Secondary | ICD-10-CM | POA: Diagnosis not present

## 2022-04-26 DIAGNOSIS — F02B3 Dementia in other diseases classified elsewhere, moderate, with mood disturbance: Secondary | ICD-10-CM | POA: Diagnosis not present

## 2022-04-29 ENCOUNTER — Telehealth: Payer: Self-pay | Admitting: Adult Health

## 2022-04-29 NOTE — Telephone Encounter (Signed)
Patient is already taking 150 mg - 100 mg 1.5 tabs. Husband said that the neurologist wanted it increased and that he had a meeting to go to and didn't have time to discuss it today. He said he was fine on waiting until Monday to discuss at appt.

## 2022-04-29 NOTE — Telephone Encounter (Signed)
We can go ahead and increase to 150mg  daily.

## 2022-04-29 NOTE — Telephone Encounter (Signed)
Pt's husband, Jomarie Longs LVM @ 12:29p.  He said pt has been to the neurologist and would like to see if Almira Coaster will increase the dose of Sertraline the pt is taking due to anxiety.      He wanted to move her appt from 12/13 to an earlier date.  I moved it to [place], 12/4.

## 2022-04-29 NOTE — Telephone Encounter (Signed)
FYI. Can be discussed at appt on Monday.

## 2022-05-02 ENCOUNTER — Ambulatory Visit: Payer: Medicare PPO | Admitting: Adult Health

## 2022-05-07 DIAGNOSIS — J069 Acute upper respiratory infection, unspecified: Secondary | ICD-10-CM | POA: Diagnosis not present

## 2022-05-09 ENCOUNTER — Ambulatory Visit (INDEPENDENT_AMBULATORY_CARE_PROVIDER_SITE_OTHER): Payer: Medicare PPO | Admitting: Adult Health

## 2022-05-09 ENCOUNTER — Encounter: Payer: Self-pay | Admitting: Adult Health

## 2022-05-09 DIAGNOSIS — F410 Panic disorder [episodic paroxysmal anxiety] without agoraphobia: Secondary | ICD-10-CM | POA: Diagnosis not present

## 2022-05-09 DIAGNOSIS — G4700 Insomnia, unspecified: Secondary | ICD-10-CM

## 2022-05-09 DIAGNOSIS — F411 Generalized anxiety disorder: Secondary | ICD-10-CM

## 2022-05-09 DIAGNOSIS — F331 Major depressive disorder, recurrent, moderate: Secondary | ICD-10-CM | POA: Diagnosis not present

## 2022-05-09 MED ORDER — SERTRALINE HCL 100 MG PO TABS: ORAL_TABLET | ORAL | 3 refills | Status: DC

## 2022-05-09 NOTE — Progress Notes (Signed)
Ladonne Howard [id number] 1950/06/06 71 y.o.  Subjective:   Patient ID:  Kelly Howard is a 71 y.o. (DOB [birth year]) female.  Chief Complaint: No chief complaint on file.   HPI Kelly Howard presents to the office today for follow-up of GAD, MDD, panic attacks, and insomnia.  Accompanied by husband.  Describes mood today as "ok". Tearful at times. Pleasant. Mood symptoms - reports depression, anxiety and irritability at times. Reports some worry and rumination. Mood is variable. Stating "I'm feel let down sometimes". Reports missing her mother. Husband supportive. Has a caregiver 30 hours a week. Daughter also living with them. Diagnosed with Parkinson's in 2010 - followed by [facility]. Varying interest and motivation. Recent physical with PCP. Taking medications as prescribed. Energy levels lower. Active, using exercise bike 3 times a week. Enjoys some usual interests and activities. Married. Lives with husband. Has 2 daughters - 3 grandchildren. Spending time with family. Appetite adequate. Weight stable - 117 pounds. Sleeps better some nights than others. Averages 5 hours then up and down. Reports some morning napping. Focus and concentration difficulties. Completing tasks. Managing some aspects of household. Retired. Denies SI or HI.  Decreased AH. Positive for VH - decreased. Reports some derealization.  Previous medication trials: Clonazepam, Zoloft, Paxil, Celexa     PHQ2-9    Flowsheet Row Office Visit from 08/10/2021 in [facility] [place] Office Visit from 04/19/2021 in [facility] [place] [facility] from 02/12/2021 in [facility] [place] [facility] from 08/27/2020 in [facility] [place] Office Visit from 08/05/2020 in [facility] [place]  PHQ-2 Total Score 3 4 1 4 4   PHQ-9 Total Score 9 19 -- 7 8      Flowsheet Row [facility]  C-SSRS RISK CATEGORY No Risk        Review of Systems:  Review of Systems  Musculoskeletal:  Negative for gait problem.  Neurological:  Negative for tremors.  Psychiatric/Behavioral:         Please refer to HPI    Medications: I have reviewed the patient's current medications.  Current Outpatient Medications  Medication Sig Dispense Refill   acetaminophen (TYLENOL) 325 MG tablet Take 325-650 mg by mouth every 6 (six) hours as needed for mild pain.     AMBULATORY NON FORMULARY MEDICATION Abdominal compression binder Dx: G20 1 Device 0   carbidopa-levodopa (SINEMET CR) 50-200 MG tablet TAKE 1 TABLET BY MOUTH EVERYDAY AT BEDTIME (Patient taking differently: Take 1 tablet by mouth at bedtime.) 90 tablet 1   carbidopa-levodopa (SINEMET IR) 25-100 MG tablet TAKE 1.5 TABLETS BY MOUTH 5 (FIVE) TIMES DAILY. 6am/9am/noon/3pm/5pm (Patient taking differently: Take 1.5 tablets by mouth See admin instructions. Take 1.5 tablets by mouth at 6 AM, 9 AM, 12 NOON, 3 PM, and 6 PM) 675 tablet 1   ibandronate (BONIVA) 150 MG tablet Take 150 mg by mouth every 30 (thirty) days.     levothyroxine (SYNTHROID, LEVOTHROID) 50 MCG tablet Take 50 mcg by mouth daily before breakfast.     NUPLAZID 34 MG CAPS Take 1 capsule by mouth daily.     rivastigmine (EXELON) 4.6 mg/24hr 4.6 mg daily.     sertraline (ZOLOFT) 100 MG tablet TAKE TWO TABLETS DAILY. 180 tablet 3   traZODone (DESYREL) 50 MG tablet TAKE 1 TO 2 TABLETS BY MOUTH EVERY NIGHT. 180 tablet 3   No current facility-administered medications for this visit.  Medication Side Effects: None  Allergies:  Allergies  Allergen Reactions   Codeine Other (See Comments)    GI Upset   Escitalopram Swelling and Other (See Comments)    Feet swell   Propoxyphene Other (See Comments)    Hallucinations    Past Medical History:  Diagnosis Date   Frequent falls    Hypothyroidism    Osteoporosis    Parkinson's disease      Past Medical History, Surgical history, Social history, and Family history were reviewed and updated as appropriate.   Please see review of systems for further details on the patient's review from today.   Objective:   Physical Exam:  LMP  (LMP Unknown) Comment: gyn  Physical Exam Constitutional:      General: She is not in acute distress. Musculoskeletal:        General: No deformity.  Neurological:     Mental Status: She is alert and oriented to person, place, and time.     Coordination: Coordination normal.  Psychiatric:        Attention and Perception: Attention and perception normal. She does not perceive auditory or visual hallucinations.        Mood and Affect: Mood normal. Mood is not anxious or depressed. Affect is not labile, blunt, angry or inappropriate.        Speech: Speech normal.        Behavior: Behavior normal.        Thought Content: Thought content normal. Thought content is not paranoid or delusional. Thought content does not include homicidal or suicidal ideation. Thought content does not include homicidal or suicidal plan.        Cognition and Memory: Cognition and memory normal.        Judgment: Judgment normal.     Comments: Insight intact     Lab Review:     Component Value Date/Time   NA 138 04/19/2021 1507   K 4.2 04/19/2021 1507   CL 98 04/19/2021 1507   CO2 29 04/19/2021 1507   GLUCOSE 93 04/19/2021 1507   BUN 15 04/19/2021 1507   CREATININE 0.71 04/19/2021 1507   CALCIUM 9.9 04/19/2021 1507   PROT 7.4 04/19/2021 1507   ALBUMIN 4.9 04/19/2021 1507   AST 13 04/19/2021 1507   ALT 4 04/19/2021 1507   ALKPHOS 66 04/19/2021 1507   BILITOT 0.6 04/19/2021 1507   GFRNONAA >60 01/06/2021 1650       Component Value Date/Time   WBC 5.7 04/19/2021 1507   RBC 4.09 04/19/2021 1507   HGB 11.9 (L) 04/19/2021 1507   HCT 35.7 (L) 04/19/2021 1507   PLT 283.0 04/19/2021 1507   MCV 87.3 04/19/2021 1507   MCH 29.4 01/06/2021 1650   MCHC 33.3  04/19/2021 1507   RDW 13.4 04/19/2021 1507   LYMPHSABS 0.6 (L) 04/19/2021 1507   MONOABS 0.5 04/19/2021 1507   EOSABS 0.0 04/19/2021 1507   BASOSABS 0.0 04/19/2021 1507    No results found for: "POCLITH", "LITHIUM"   No results found for: "PHENYTOIN", "PHENOBARB", "VALPROATE", "CBMZ"   .res Assessment: Plan:    Plan:  PDMP reviewed  Zoloft 150mg  to 200mg  daily Trazadone 50mg  - take 1 to 2 at hs  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 Greater than 50% of face to face time with patient was spent on counseling and coordination of care.    Patient advised to contact office  with any questions, adverse effects, or acute worsening in signs and symptoms.  Discussed potential benefits, risk, and side effects of benzodiazepines to include potential risk of tolerance and dependence, as well as possible drowsiness.  Advised patient not to drive if experiencing drowsiness and to take lowest possible effective dose to minimize risk of dependence and tolerance.  Diagnoses and all orders for this visit:  Major depressive disorder, recurrent episode, moderate (HCC) -     sertraline (ZOLOFT) 100 MG tablet; TAKE TWO TABLETS DAILY.  Generalized anxiety disorder -     sertraline (ZOLOFT) 100 MG tablet; TAKE TWO TABLETS DAILY.     Please see After Visit Summary for patient specific instructions.  No future appointments.   No orders of the defined types were placed in this encounter.   -------------------------------

## 2022-05-11 ENCOUNTER — Ambulatory Visit: Payer: Medicare PPO | Admitting: Adult Health

## 2022-05-13 DIAGNOSIS — R350 Frequency of micturition: Secondary | ICD-10-CM | POA: Diagnosis not present

## 2022-05-17 ENCOUNTER — Telehealth: Payer: Self-pay | Admitting: Adult Health

## 2022-05-17 NOTE — Telephone Encounter (Signed)
Patient not sleeping with 1 trazodone. Told husband that Rx was written for 1-2.  Zoloft Rx also says to take 2 qd and patient had been taking 2 at bedtime. Suggested she take 1 AM and 1 PM.

## 2022-05-17 NOTE — Telephone Encounter (Signed)
Husband has questions about how to take the zoloft. Can he he do one pill in the am and one at night. Also he has questions about trazadone. Please call him at [phone]

## 2022-05-21 ENCOUNTER — Emergency Department (HOSPITAL_COMMUNITY)
Admission: EM | Admit: 2022-05-21 | Discharge: 2022-05-21 | Disposition: A | Discharge status: Home / Self Care | Payer: Medicare PPO | Attending: Emergency Medicine | Admitting: Emergency Medicine

## 2022-05-21 ENCOUNTER — Encounter (HOSPITAL_COMMUNITY): Payer: Self-pay | Admitting: *Deleted

## 2022-05-21 ENCOUNTER — Other Ambulatory Visit: Payer: Self-pay

## 2022-05-21 ENCOUNTER — Emergency Department (HOSPITAL_COMMUNITY): Payer: Medicare PPO

## 2022-05-21 DIAGNOSIS — R4182 Altered mental status, unspecified: Secondary | ICD-10-CM | POA: Diagnosis not present

## 2022-05-21 DIAGNOSIS — R0602 Shortness of breath: Secondary | ICD-10-CM | POA: Diagnosis not present

## 2022-05-21 DIAGNOSIS — Z20822 Contact with and (suspected) exposure to covid-19: Secondary | ICD-10-CM | POA: Diagnosis not present

## 2022-05-21 DIAGNOSIS — G20C Parkinsonism, unspecified: Secondary | ICD-10-CM | POA: Diagnosis not present

## 2022-05-21 DIAGNOSIS — R410 Disorientation, unspecified: Secondary | ICD-10-CM | POA: Insufficient documentation

## 2022-05-21 LAB — CBC WITH DIFFERENTIAL/PLATELET
Abs Immature Granulocytes: 0.02 10*3/uL (ref 0.00–0.07)
Basophils Absolute: 0 10*3/uL (ref 0.0–0.1)
Basophils Relative: 1 %
Eosinophils Absolute: 0 10*3/uL (ref 0.0–0.5)
Eosinophils Relative: 1 %
HCT: 33.4 % — ABNORMAL LOW (ref 36.0–46.0)
Hemoglobin: 10.5 g/dL — ABNORMAL LOW (ref 12.0–15.0)
Immature Granulocytes: 1 %
Lymphocytes Relative: 20 %
Lymphs Abs: 0.7 10*3/uL (ref 0.7–4.0)
MCH: 28.5 pg (ref 26.0–34.0)
MCHC: 31.4 g/dL (ref 30.0–36.0)
MCV: 90.8 fL (ref 80.0–100.0)
Monocytes Absolute: 0.4 10*3/uL (ref 0.1–1.0)
Monocytes Relative: 11 %
Neutro Abs: 2.4 10*3/uL (ref 1.7–7.7)
Neutrophils Relative %: 66 %
Platelets: 296 10*3/uL (ref 150–400)
RBC: 3.68 MIL/uL — ABNORMAL LOW (ref 3.87–5.11)
RDW: 12.8 % (ref 11.5–15.5)
WBC: 3.6 10*3/uL — ABNORMAL LOW (ref 4.0–10.5)
nRBC: 0 % (ref 0.0–0.2)

## 2022-05-21 LAB — URINALYSIS, ROUTINE W REFLEX MICROSCOPIC
Bacteria, UA: NONE SEEN
Bilirubin Urine: NEGATIVE
Glucose, UA: NEGATIVE mg/dL
Ketones, ur: 5 mg/dL — AB
Leukocytes,Ua: NEGATIVE
Nitrite: NEGATIVE
Protein, ur: NEGATIVE mg/dL
Specific Gravity, Urine: 1.012 (ref 1.005–1.030)
pH: 7 (ref 5.0–8.0)

## 2022-05-21 LAB — COMPREHENSIVE METABOLIC PANEL
ALT: <5 U/L (ref 0–44)
AST: 14 U/L — ABNORMAL LOW (ref 15–41)
Albumin: 3.7 g/dL (ref 3.5–5.0)
Alkaline Phosphatase: 53 U/L (ref 38–126)
Anion gap: 7 (ref 5–15)
BUN: 17 mg/dL (ref 8–23)
CO2: 28 mmol/L (ref 22–32)
Calcium: 8.5 mg/dL — ABNORMAL LOW (ref 8.9–10.3)
Chloride: 102 mmol/L (ref 98–111)
Creatinine, Ser: 0.67 mg/dL (ref 0.44–1.00)
GFR, Estimated: >60 mL/min (ref >60)
Glucose, Bld: 87 mg/dL (ref 70–99)
Potassium: 3.8 mmol/L (ref 3.5–5.1)
Sodium: 137 mmol/L (ref 135–145)
Total Bilirubin: 0.7 mg/dL (ref 0.3–1.2)
Total Protein: 5.9 g/dL — ABNORMAL LOW (ref 6.5–8.1)

## 2022-05-21 LAB — TROPONIN I (HIGH SENSITIVITY)
Troponin I (High Sensitivity): 4 ng/L (ref <18)
Troponin I (High Sensitivity): 4 ng/L (ref <18)

## 2022-05-21 LAB — RESP PANEL BY RT-PCR (RSV, FLU A&B, COVID)  RVPGX2
Influenza A by PCR: NEGATIVE
Influenza B by PCR: NEGATIVE
Resp Syncytial Virus by PCR: NEGATIVE
SARS Coronavirus 2 by RT PCR: NEGATIVE

## 2022-05-21 LAB — D-DIMER, QUANTITATIVE: D-Dimer, Quant: <0.27 ug/mL-FEU (ref 0.00–0.50)

## 2022-05-21 LAB — BRAIN NATRIURETIC PEPTIDE: B Natriuretic Peptide: 33.5 pg/mL (ref 0.0–100.0)

## 2022-05-21 NOTE — ED Triage Notes (Signed)
Pt from home tonight for being less responsive than normal and sob. Husband reported to EMS that pt typically gets up at 1am to use the restroom. Tonight, pt was more lethargic and not answering questions appropriately. Pt was placed on 2L [place] for comfort, currently on room air, denies new pain or sob. Pt has c/o neck and back pain. 18g I V to L arm

## 2022-05-21 NOTE — ED Provider Notes (Signed)
[facility] EMERGENCY DEPARTMENT Provider Note   CSN: [id number] Arrival date & time: 05/21/22  0435     History  Chief Complaint  Patient presents with   Altered Mental Status    Kelly Howard is a 71 y.o. female.  The history is provided by the patient, the spouse and medical records.  Altered Mental Status Kelly Howard is a 71 y.o. female who presents to the Emergency Department complaining of altered mental status.  Level 5 caveat due to confusion.  History is provided by the patient and her husband.  She has a history of Parkinson's and presents from home for evaluation of confusion.  Husband states that she had medication adjustments over the last week and a half with increases in her trazodone and Zoloft.  She does have underlying anxiety.  He reports that for the last couple nights she has been waking around 1 AM, which is usual for her but at this time she has been complaining of shortness of breath.  She has been able to settle down and go back to sleep when he has checked her vital signs at home and they have been normal.  Tonight she seemed confused compared to her baseline and more drowsy and had more complaints of shortness of breath and he brought her in for evaluation.  No associated chest pain, fevers.  She does report feeling short of breath.  She feels like it is difficult to swallow.  She states she takes too many pills.  No leg swelling or pain.  She has had 2 falls but they were around Thanksgiving time.  No history of blood clots, cardiac disease.  She did have RSV about 3 to 4 weeks ago.     Home Medications Prior to Admission medications   Medication Sig Start Date End Date Taking? Authorizing Provider  acetaminophen (TYLENOL) 325 MG tablet Take 325-650 mg by mouth every 6 (six) hours as needed for mild pain.    [provider]  AMBULATORY NON FORMULARY MEDICATION Abdominal compression binder Dx: G20 11/07/19   Tat,  Octaviano Batty, DO  carbidopa-levodopa (SINEMET CR) 50-200 MG tablet TAKE 1 TABLET BY MOUTH EVERYDAY AT BEDTIME Patient taking differently: Take 1 tablet by mouth at bedtime. 11/19/20   Tat, Octaviano Batty, DO  carbidopa-levodopa (SINEMET IR) 25-100 MG tablet TAKE 1.5 TABLETS BY MOUTH 5 (FIVE) TIMES DAILY. 6am/9am/noon/3pm/5pm Patient taking differently: Take 1.5 tablets by mouth See admin instructions. Take 1.5 tablets by mouth at 6 AM, 9 AM, 12 NOON, 3 PM, and 6 PM 11/12/20   Tat, [name] S, DO  ibandronate (BONIVA) 150 MG tablet Take 150 mg by mouth every 30 (thirty) days. 08/12/19   [provider]  levothyroxine (SYNTHROID, LEVOTHROID) 50 MCG tablet Take 50 mcg by mouth daily before breakfast. 03/09/12   [provider]  NUPLAZID 34 MG CAPS Take 1 capsule by mouth daily. 01/30/21   [provider]  rivastigmine (EXELON) 4.6 mg/24hr 4.6 mg daily. 03/30/21   [provider]  sertraline (ZOLOFT) 100 MG tablet TAKE TWO TABLETS DAILY. 05/09/22   Mozingo, Thereasa Solo, NP  traZODone (DESYREL) 50 MG tablet TAKE 1 TO 2 TABLETS BY MOUTH EVERY NIGHT. 02/09/22   Mozingo, Thereasa Solo, NP      Allergies    Codeine, Escitalopram, and Propoxyphene    Review of Systems   Review of Systems  All other systems reviewed and are negative.   Physical Exam Updated Vital Signs BP Marland Kitchen)  147/73   Pulse 83   Temp 98.6 F (37 C) (Oral)   Resp 16   LMP  (LMP Unknown) Comment: gyn  SpO2 97%  Physical Exam Vitals and nursing note reviewed.  Constitutional:      Appearance: She is well-developed.  HENT:     Head: Normocephalic and atraumatic.  Cardiovascular:     Rate and Rhythm: Normal rate and regular rhythm.     Heart sounds: No murmur heard. Pulmonary:     Effort: Pulmonary effort is normal. No respiratory distress.     Breath sounds: Normal breath sounds.  Abdominal:     Palpations: Abdomen is soft.     Tenderness: There is no abdominal tenderness. There is no guarding  or rebound.  Musculoskeletal:        General: No tenderness.  Skin:    General: Skin is warm and dry.  Neurological:     Mental Status: She is alert.     Comments: Quiet speech.  Slow to answer questions.  5 out of 5 strength in bilateral upper extremities.  4+ out of 5 strength in bilateral lower extremities.  No asymmetry of facial movements.  Psychiatric:     Comments: Anxious appearing     ED Results / Procedures / Treatments   Labs (all labs ordered are listed, but only abnormal results are displayed) Labs Reviewed  CBC WITH DIFFERENTIAL/PLATELET - Abnormal; Notable for the following components:      Result Value   WBC 3.6 (*)    RBC 3.68 (*)    Hemoglobin 10.5 (*)    HCT 33.4 (*)    All other components within normal limits  COMPREHENSIVE METABOLIC PANEL - Abnormal; Notable for the following components:   Calcium 8.5 (*)    Total Protein 5.9 (*)    AST 14 (*)    All other components within normal limits  URINALYSIS, ROUTINE W REFLEX MICROSCOPIC - Abnormal; Notable for the following components:   Hgb urine dipstick SMALL (*)    Ketones, ur 5 (*)    All other components within normal limits  RESP PANEL BY RT-PCR (RSV, FLU A&B, COVID)  RVPGX2  BRAIN NATRIURETIC PEPTIDE  D-DIMER, QUANTITATIVE  TROPONIN I (HIGH SENSITIVITY)  TROPONIN I (HIGH SENSITIVITY)  TROPONIN I (HIGH SENSITIVITY)    EKG EKG Interpretation  Date/Time:  Saturday May 21 2022 04:37:42 EST Ventricular Rate:  75 PR Interval:  120 QRS Duration: 82 QT Interval:  378 QTC Calculation: 422 R Axis:   17 Text Interpretation: Normal sinus rhythm Normal ECG Confirmed by Tilden Fossa [phone]) on 05/21/2022 6:09:20 AM  Radiology DG Chest 2 View  Result Date: 05/21/2022 CLINICAL DATA:  Short of breath.  Decreased responsiveness. EXAM: CHEST - 2 VIEW COMPARISON:  None Available. FINDINGS: Heart size and mediastinal contours appear normal. No pleural effusion or edema identified. No airspace  opacities identified. The visualized osseous structures are remarkable for remote right posterior fourth, fifth and sixth rib fractures. No acute osseous findings. IMPRESSION: 1. No acute cardiopulmonary abnormalities. 2. Remote right posterior fourth, fifth and sixth rib fractures. Electronically Signed   By: Signa Kell M.D.   On: 05/21/2022 06:50   CT Head Wo Contrast  Result Date: 05/21/2022 CLINICAL DATA:  Mental status change EXAM: CT HEAD WITHOUT CONTRAST TECHNIQUE: Contiguous axial images were obtained from the base of the skull through the vertex without intravenous contrast. RADIATION DOSE REDUCTION: This exam was performed according to the departmental dose-optimization program which includes automated exposure  control, adjustment of the mA and/or kV according to patient size and/or use of iterative reconstruction technique. COMPARISON:  12/16/2018 FINDINGS: Brain: No evidence of acute infarction, hemorrhage, hydrocephalus, extra-axial collection or mass lesion/mass effect. Vascular: No hyperdense vessel or unexpected calcification. Skull: Normal. Negative for fracture or focal lesion. Sinuses/Orbits: No acute finding. IMPRESSION: Unremarkable head CT. Electronically Signed   By: Tiburcio Pea M.D.   On: 05/21/2022 06:08    Procedures Procedures    Medications Ordered in ED Medications - No data to display  ED Course/ Medical Decision Making/ A&P                           Medical Decision Making Amount and/or Complexity of Data Reviewed Labs: ordered. Radiology: ordered.   Patient with history of Parkinson's disease, anxiety here for evaluation of difficulty breathing.  Patient does appear anxious on evaluation with no respiratory distress.  Lungs are clear bilaterally.  Chest x-ray without acute infiltrate or acute abnormality-images personally reviewed and interpreted, agree with radiologist interpretation.  EKG is without acute ischemic changes.  Patient does not have any  chest pain, troponin is negative.  BNP is negative.  I doubt acute cardiac event.  She is low risk for DVT/PE-D-dimer is negative, doubt acute PE.  CBC with anemia-this is similar when compared to priors.  No reports of active bleeding.  BMP without significant electrolyte abnormality.  UA is not consistent with UTI.  CT head is without acute changes.  Family reports that her mental status is back at baseline at time of reevaluation.  Patient also states that her breathing feels improved.  Discussed with family and patient unclear source of her difficulty breathing.  This may be secondary to underlying anxiety versus an additional issue that will require further outpatient workup.  Feel she is stable at this point for outpatient follow-up and return precautions.        Final Clinical Impression(s) / ED Diagnoses Final diagnoses:  SOB (shortness of breath)  Confusion    Rx / DC Orders ED Discharge Orders     None         Tilden Fossa, MD 05/21/22 2246

## 2022-05-21 NOTE — ED Notes (Signed)
DC instructions reveiwed with pt. Pt verbalized understanding. Pt DC

## 2022-05-26 ENCOUNTER — Telehealth: Payer: Self-pay | Admitting: Adult Health

## 2022-05-26 NOTE — Telephone Encounter (Signed)
Patient seen 12/11, Rx sent for 100 mg x2 = 200. LVM to [facility].

## 2022-05-26 NOTE — Telephone Encounter (Signed)
Next appt is 11/08/22. Kelly Howard's [name] called stating that her Zoloft was changed from 200 mg to 100 mg. She now is having a lot of confusion, sleepiness, a lot of anxiety and trouble breathing. Husband states that she was taken by ambulance to hospital for the symptoms. Please call her [name] regarding this at [phone].

## 2022-05-26 NOTE — Telephone Encounter (Signed)
Husband had called 12/19 and said patient wasn't sleeping on 1 trazodone. Rx was written 1-2 and I told him she could have 2.  Patient had a PA and went to ER 12/23. Now husband saying patient can't tolerate 2 trazodone, even though she is still not sleeping all night with 1. He was giving Zoloft 100 mg AM and 100 mg PM but said patient is too groggy. He said patient is seeing neurologist and you have been working with them to optimize patient's medications. Husband said patient had been on clonazepam previously but it made her too "loopy". Please advise.

## 2022-05-26 NOTE — Telephone Encounter (Signed)
Can we just reduce the doses and see how she does.

## 2022-05-26 NOTE — Telephone Encounter (Signed)
Husband notified, one of each.  Asked that he call early next week to let us know how she was doing.

## 2022-06-02 ENCOUNTER — Telehealth: Payer: Self-pay

## 2022-06-02 NOTE — Telephone Encounter (Signed)
     Patient  visit on 05/21/2022  at [facility]. [facility] was for shortness of breath, confusion.  Have you been able to follow up with your primary care physician? Yes, also followed up with Neurologist.  The patient was or was not able to obtain any needed medicine or equipment. No medications prescribed.  Are there diet recommendations that you are having difficulty following?No  Patient expresses understanding of discharge instructions and education provided has no other needs at this time.    [facility] Guide   [email or web address]  ?? [phone]   Website: [email or web address]  [email or web address]

## 2022-06-08 DIAGNOSIS — F0284 Dementia in other diseases classified elsewhere, unspecified severity, with anxiety: Secondary | ICD-10-CM | POA: Diagnosis not present

## 2022-06-08 DIAGNOSIS — G20B2 Parkinson's disease with dyskinesia, with fluctuations: Secondary | ICD-10-CM | POA: Diagnosis not present

## 2022-06-08 DIAGNOSIS — Z79899 Other long term (current) drug therapy: Secondary | ICD-10-CM | POA: Diagnosis not present

## 2022-06-08 DIAGNOSIS — F0282 Dementia in other diseases classified elsewhere, unspecified severity, with psychotic disturbance: Secondary | ICD-10-CM | POA: Diagnosis not present

## 2022-06-09 DIAGNOSIS — R350 Frequency of micturition: Secondary | ICD-10-CM | POA: Diagnosis not present

## 2022-06-13 DIAGNOSIS — M25511 Pain in right shoulder: Secondary | ICD-10-CM | POA: Diagnosis not present

## 2022-07-05 ENCOUNTER — Telehealth: Payer: Self-pay | Admitting: Internal Medicine

## 2022-07-05 DIAGNOSIS — G20A1 Parkinson's disease without dyskinesia, without mention of fluctuations: Secondary | ICD-10-CM

## 2022-07-05 NOTE — Telephone Encounter (Signed)
Left message on machine for husband to return our call 

## 2022-07-05 NOTE — Telephone Encounter (Signed)
Pt's spouse called to ask if MD could provide any information regarding home health care.  Please advise.

## 2022-07-06 DIAGNOSIS — M542 Cervicalgia: Secondary | ICD-10-CM | POA: Diagnosis not present

## 2022-07-06 NOTE — Telephone Encounter (Signed)
Referral placed. Left message on machine for husband.

## 2022-07-06 NOTE — Addendum Note (Signed)
Addended by: Westley Hummer B on: 07/06/2022 12:04 PM   Modules accepted: Orders

## 2022-07-06 NOTE — Telephone Encounter (Signed)
Spoke with patient and he is requesting in home PT.  Okay to order?

## 2022-07-07 DIAGNOSIS — R350 Frequency of micturition: Secondary | ICD-10-CM | POA: Diagnosis not present

## 2022-07-11 DIAGNOSIS — M25552 Pain in left hip: Secondary | ICD-10-CM | POA: Diagnosis not present

## 2022-07-11 DIAGNOSIS — M25511 Pain in right shoulder: Secondary | ICD-10-CM | POA: Diagnosis not present

## 2022-07-12 DIAGNOSIS — M7501 Adhesive capsulitis of right shoulder: Secondary | ICD-10-CM | POA: Diagnosis not present

## 2022-07-18 ENCOUNTER — Telehealth: Payer: Self-pay | Admitting: Internal Medicine

## 2022-07-18 NOTE — Telephone Encounter (Signed)
Kelly Howard stated their is a delay in Physical therapy evaluation do to missing communication with her husband .Kelly Howard 's # is [id number].

## 2022-07-20 DIAGNOSIS — F02B4 Dementia in other diseases classified elsewhere, moderate, with anxiety: Secondary | ICD-10-CM | POA: Diagnosis not present

## 2022-07-20 DIAGNOSIS — G3183 Dementia with Lewy bodies: Secondary | ICD-10-CM | POA: Diagnosis not present

## 2022-07-20 DIAGNOSIS — M25552 Pain in left hip: Secondary | ICD-10-CM | POA: Diagnosis not present

## 2022-07-20 DIAGNOSIS — F02B2 Dementia in other diseases classified elsewhere, moderate, with psychotic disturbance: Secondary | ICD-10-CM | POA: Diagnosis not present

## 2022-07-20 DIAGNOSIS — F02B3 Dementia in other diseases classified elsewhere, moderate, with mood disturbance: Secondary | ICD-10-CM | POA: Diagnosis not present

## 2022-07-20 DIAGNOSIS — E039 Hypothyroidism, unspecified: Secondary | ICD-10-CM | POA: Diagnosis not present

## 2022-07-20 DIAGNOSIS — M19011 Primary osteoarthritis, right shoulder: Secondary | ICD-10-CM | POA: Diagnosis not present

## 2022-07-20 DIAGNOSIS — G309 Alzheimer's disease, unspecified: Secondary | ICD-10-CM | POA: Diagnosis not present

## 2022-07-20 DIAGNOSIS — M810 Age-related osteoporosis without current pathological fracture: Secondary | ICD-10-CM | POA: Diagnosis not present

## 2022-07-25 DIAGNOSIS — F02B3 Dementia in other diseases classified elsewhere, moderate, with mood disturbance: Secondary | ICD-10-CM | POA: Diagnosis not present

## 2022-07-25 DIAGNOSIS — F02B2 Dementia in other diseases classified elsewhere, moderate, with psychotic disturbance: Secondary | ICD-10-CM | POA: Diagnosis not present

## 2022-07-25 DIAGNOSIS — M25552 Pain in left hip: Secondary | ICD-10-CM | POA: Diagnosis not present

## 2022-07-25 DIAGNOSIS — M810 Age-related osteoporosis without current pathological fracture: Secondary | ICD-10-CM | POA: Diagnosis not present

## 2022-07-25 DIAGNOSIS — E039 Hypothyroidism, unspecified: Secondary | ICD-10-CM | POA: Diagnosis not present

## 2022-07-25 DIAGNOSIS — G309 Alzheimer's disease, unspecified: Secondary | ICD-10-CM | POA: Diagnosis not present

## 2022-07-25 DIAGNOSIS — M19011 Primary osteoarthritis, right shoulder: Secondary | ICD-10-CM | POA: Diagnosis not present

## 2022-07-25 DIAGNOSIS — F02B4 Dementia in other diseases classified elsewhere, moderate, with anxiety: Secondary | ICD-10-CM | POA: Diagnosis not present

## 2022-07-25 DIAGNOSIS — G3183 Dementia with Lewy bodies: Secondary | ICD-10-CM | POA: Diagnosis not present

## 2022-07-27 ENCOUNTER — Telehealth: Payer: Self-pay | Admitting: Internal Medicine

## 2022-07-27 DIAGNOSIS — F02B4 Dementia in other diseases classified elsewhere, moderate, with anxiety: Secondary | ICD-10-CM | POA: Diagnosis not present

## 2022-07-27 DIAGNOSIS — M25552 Pain in left hip: Secondary | ICD-10-CM | POA: Diagnosis not present

## 2022-07-27 DIAGNOSIS — E039 Hypothyroidism, unspecified: Secondary | ICD-10-CM | POA: Diagnosis not present

## 2022-07-27 DIAGNOSIS — F02B2 Dementia in other diseases classified elsewhere, moderate, with psychotic disturbance: Secondary | ICD-10-CM | POA: Diagnosis not present

## 2022-07-27 DIAGNOSIS — G3183 Dementia with Lewy bodies: Secondary | ICD-10-CM | POA: Diagnosis not present

## 2022-07-27 DIAGNOSIS — G309 Alzheimer's disease, unspecified: Secondary | ICD-10-CM | POA: Diagnosis not present

## 2022-07-27 DIAGNOSIS — M19011 Primary osteoarthritis, right shoulder: Secondary | ICD-10-CM | POA: Diagnosis not present

## 2022-07-27 DIAGNOSIS — M810 Age-related osteoporosis without current pathological fracture: Secondary | ICD-10-CM | POA: Diagnosis not present

## 2022-07-27 DIAGNOSIS — F02B3 Dementia in other diseases classified elsewhere, moderate, with mood disturbance: Secondary | ICD-10-CM | POA: Diagnosis not present

## 2022-07-27 NOTE — Telephone Encounter (Signed)
Left detailed message on machine for [facility] with verbal orders.

## 2022-07-27 NOTE — Telephone Encounter (Signed)
Verbal Orders - Speech therapy  Lovena Le, Speech Therapist with [facility] [phone] [facility] to leave a detailed message on this line  Frequency:  Once a wk for 6 wks (Voice & Memory Strategy)

## 2022-08-01 DIAGNOSIS — M810 Age-related osteoporosis without current pathological fracture: Secondary | ICD-10-CM | POA: Diagnosis not present

## 2022-08-03 DIAGNOSIS — F02B3 Dementia in other diseases classified elsewhere, moderate, with mood disturbance: Secondary | ICD-10-CM | POA: Diagnosis not present

## 2022-08-03 DIAGNOSIS — F02B4 Dementia in other diseases classified elsewhere, moderate, with anxiety: Secondary | ICD-10-CM | POA: Diagnosis not present

## 2022-08-03 DIAGNOSIS — G3183 Dementia with Lewy bodies: Secondary | ICD-10-CM | POA: Diagnosis not present

## 2022-08-03 DIAGNOSIS — M25552 Pain in left hip: Secondary | ICD-10-CM | POA: Diagnosis not present

## 2022-08-03 DIAGNOSIS — G309 Alzheimer's disease, unspecified: Secondary | ICD-10-CM | POA: Diagnosis not present

## 2022-08-03 DIAGNOSIS — F02B2 Dementia in other diseases classified elsewhere, moderate, with psychotic disturbance: Secondary | ICD-10-CM | POA: Diagnosis not present

## 2022-08-03 DIAGNOSIS — M19011 Primary osteoarthritis, right shoulder: Secondary | ICD-10-CM | POA: Diagnosis not present

## 2022-08-03 DIAGNOSIS — M810 Age-related osteoporosis without current pathological fracture: Secondary | ICD-10-CM | POA: Diagnosis not present

## 2022-08-03 DIAGNOSIS — E039 Hypothyroidism, unspecified: Secondary | ICD-10-CM | POA: Diagnosis not present

## 2022-08-03 DIAGNOSIS — Z681 Body mass index (BMI) 19 or less, adult: Secondary | ICD-10-CM | POA: Diagnosis not present

## 2022-08-03 DIAGNOSIS — J069 Acute upper respiratory infection, unspecified: Secondary | ICD-10-CM | POA: Diagnosis not present

## 2022-08-04 DIAGNOSIS — R350 Frequency of micturition: Secondary | ICD-10-CM | POA: Diagnosis not present

## 2022-08-05 DIAGNOSIS — E039 Hypothyroidism, unspecified: Secondary | ICD-10-CM | POA: Diagnosis not present

## 2022-08-05 DIAGNOSIS — M25552 Pain in left hip: Secondary | ICD-10-CM | POA: Diagnosis not present

## 2022-08-05 DIAGNOSIS — F02B3 Dementia in other diseases classified elsewhere, moderate, with mood disturbance: Secondary | ICD-10-CM | POA: Diagnosis not present

## 2022-08-05 DIAGNOSIS — G309 Alzheimer's disease, unspecified: Secondary | ICD-10-CM | POA: Diagnosis not present

## 2022-08-05 DIAGNOSIS — F02B4 Dementia in other diseases classified elsewhere, moderate, with anxiety: Secondary | ICD-10-CM | POA: Diagnosis not present

## 2022-08-05 DIAGNOSIS — M19011 Primary osteoarthritis, right shoulder: Secondary | ICD-10-CM | POA: Diagnosis not present

## 2022-08-05 DIAGNOSIS — F02B2 Dementia in other diseases classified elsewhere, moderate, with psychotic disturbance: Secondary | ICD-10-CM | POA: Diagnosis not present

## 2022-08-05 DIAGNOSIS — M810 Age-related osteoporosis without current pathological fracture: Secondary | ICD-10-CM | POA: Diagnosis not present

## 2022-08-05 DIAGNOSIS — G3183 Dementia with Lewy bodies: Secondary | ICD-10-CM | POA: Diagnosis not present

## 2022-08-08 NOTE — Therapy (Signed)
[facility] [address] [place], [place], [address] Phone: [phone]   Fax:  [phone]  Patient Details  Name: Kelly Howard MRN: [id number] Date of Birth: 10-30-1950 Referring Provider:  Koleen Nimrod, MD  Encounter Date: 08/08/2022  SPEECH THERAPY DISCHARGE SUMMARY  Visits from Start of Care: 4  Current functional level related to goals / functional outcomes: Pt had 4 ST visits and then did not attend any more visits after April 2023. Goals and plan from that [place] are below.: SLP Short Term Goals - 09/07/21 0005                SLP SHORT TERM GOAL #1    Title pt will produce /a/ at upper 60s dB with occasional min A in 3 sessions     Baseline 09-06-21     Time 4     Period Weeks     Status On-going     Target Date 09/17/21          SLP SHORT TERM GOAL #2    Title pt will demo incr'd breath support with /a/ 75% of the time with rare min A in 2 sessions     Time 4     Period Weeks     Status On-going     Target Date 09/17/21          SLP SHORT TERM GOAL #3    Title pt will state functional pt/family generated sentences with volume in upper 60s dB in 3 sessions     Time 4     Period Weeks     Status On-going     Target Date 09/17/21          SLP SHORT TERM GOAL #4    Title pt will undergo bedside swallow assessment     Time 2     Period Weeks     Status On-going     Target Date 09/03/21          SLP SHORT TERM GOAL #5    Title pt will perform effortful swallow exercise x8 with occasional min-mod A     Time 4     Period Weeks     Status On-going     Target Date 09/17/21                     SLP Long Term Goals - 09/07/21 0006                SLP LONG TERM GOAL #1    Title pt family/caregiver will demo successful cues to enable pt to achieve loud /a/ at upper 60s dB with occasional min A in 2 sessions     Baseline 09-06-21     Time 8     Period Weeks     Status On-going      Target Date 10/15/21          SLP LONG TERM GOAL #2    Title pt family/caregiver will demo successful cues to enable pt to read sentences with volume in upper 60s dB in 2 sessions     Time 8     Period Weeks     Status On-going     Target Date 10/15/21          SLP LONG TERM GOAL #3    Title pt will improve speech volume to upper 60s dB in 3 minutes simple conversation  with occasional min-mod cues x3 sessions     Time 8     Period Weeks     Status On-going     Target Date 10/15/21          SLP LONG TERM GOAL #4    Title pt family/caregiver will cue pt to enable pt to perform swallow HEP with occasional min A, in 2 sessions     Time 8     Period Weeks     Status On-going     Target Date 10/15/21          SLP LONG TERM GOAL #5    Title (related to LTG #3) pt family/caregiver will cue pt adequately for pt to produce speech volume in upper 60s dB 80% of the time with pt response to "wh" or yes/no questions x3 sessions     Time 8     Period Weeks     Status On-going     Target Date 10/15/21                     Plan - 09/07/21 0005       Clinical Impression Statement Pt ("[name]") presents today with 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 Pt will benefit from skilled ST to target improved speech volume to improve communication and lessen caregiver burden, adn to teach caregiver/s compensatory strategies for improved communication int he future. Secondly, ST will also be helpful in providing support for swallowing/mealtimes. An instrumental swallow evaluation (MBSS, FEES) may be necessary and signature on this plan of care will indicate MD's approval for the evaluation.     Speech Therapy Frequency 2x / week     Duration 8 weeks         Remaining deficits: Assumed that deficits remain. Pt is unexpectedly d/c'd.   Education / Equipment: See evaluation and ST visits March and April 2023.   Patient agrees to discharge. Patient  goals were not met. Patient is being discharged due to not returning since the last visit.Marland Kitchen     [place] 08/08/2022, 10:08 AM  [facility] [address] [place], [place], [address] Phone: [phone]   Fax:  [phone]

## 2022-08-09 DIAGNOSIS — M810 Age-related osteoporosis without current pathological fracture: Secondary | ICD-10-CM | POA: Diagnosis not present

## 2022-08-09 DIAGNOSIS — G309 Alzheimer's disease, unspecified: Secondary | ICD-10-CM | POA: Diagnosis not present

## 2022-08-09 DIAGNOSIS — E039 Hypothyroidism, unspecified: Secondary | ICD-10-CM | POA: Diagnosis not present

## 2022-08-09 DIAGNOSIS — F02B3 Dementia in other diseases classified elsewhere, moderate, with mood disturbance: Secondary | ICD-10-CM | POA: Diagnosis not present

## 2022-08-09 DIAGNOSIS — M19011 Primary osteoarthritis, right shoulder: Secondary | ICD-10-CM | POA: Diagnosis not present

## 2022-08-09 DIAGNOSIS — F02B2 Dementia in other diseases classified elsewhere, moderate, with psychotic disturbance: Secondary | ICD-10-CM | POA: Diagnosis not present

## 2022-08-09 DIAGNOSIS — M25552 Pain in left hip: Secondary | ICD-10-CM | POA: Diagnosis not present

## 2022-08-09 DIAGNOSIS — F02B4 Dementia in other diseases classified elsewhere, moderate, with anxiety: Secondary | ICD-10-CM | POA: Diagnosis not present

## 2022-08-09 DIAGNOSIS — G3183 Dementia with Lewy bodies: Secondary | ICD-10-CM | POA: Diagnosis not present

## 2022-08-10 DIAGNOSIS — M810 Age-related osteoporosis without current pathological fracture: Secondary | ICD-10-CM | POA: Diagnosis not present

## 2022-08-10 DIAGNOSIS — M19011 Primary osteoarthritis, right shoulder: Secondary | ICD-10-CM | POA: Diagnosis not present

## 2022-08-10 DIAGNOSIS — F02B2 Dementia in other diseases classified elsewhere, moderate, with psychotic disturbance: Secondary | ICD-10-CM | POA: Diagnosis not present

## 2022-08-10 DIAGNOSIS — M25552 Pain in left hip: Secondary | ICD-10-CM | POA: Diagnosis not present

## 2022-08-10 DIAGNOSIS — F02B3 Dementia in other diseases classified elsewhere, moderate, with mood disturbance: Secondary | ICD-10-CM | POA: Diagnosis not present

## 2022-08-10 DIAGNOSIS — F02B4 Dementia in other diseases classified elsewhere, moderate, with anxiety: Secondary | ICD-10-CM | POA: Diagnosis not present

## 2022-08-10 DIAGNOSIS — G3183 Dementia with Lewy bodies: Secondary | ICD-10-CM | POA: Diagnosis not present

## 2022-08-10 DIAGNOSIS — E039 Hypothyroidism, unspecified: Secondary | ICD-10-CM | POA: Diagnosis not present

## 2022-08-10 DIAGNOSIS — G309 Alzheimer's disease, unspecified: Secondary | ICD-10-CM | POA: Diagnosis not present

## 2022-08-12 DIAGNOSIS — F02B3 Dementia in other diseases classified elsewhere, moderate, with mood disturbance: Secondary | ICD-10-CM | POA: Diagnosis not present

## 2022-08-12 DIAGNOSIS — G20A2 Parkinson's disease without dyskinesia, with fluctuations: Secondary | ICD-10-CM | POA: Diagnosis not present

## 2022-08-15 DIAGNOSIS — G309 Alzheimer's disease, unspecified: Secondary | ICD-10-CM | POA: Diagnosis not present

## 2022-08-15 DIAGNOSIS — E039 Hypothyroidism, unspecified: Secondary | ICD-10-CM | POA: Diagnosis not present

## 2022-08-15 DIAGNOSIS — F02B3 Dementia in other diseases classified elsewhere, moderate, with mood disturbance: Secondary | ICD-10-CM | POA: Diagnosis not present

## 2022-08-15 DIAGNOSIS — F02B4 Dementia in other diseases classified elsewhere, moderate, with anxiety: Secondary | ICD-10-CM | POA: Diagnosis not present

## 2022-08-15 DIAGNOSIS — M810 Age-related osteoporosis without current pathological fracture: Secondary | ICD-10-CM | POA: Diagnosis not present

## 2022-08-15 DIAGNOSIS — M25552 Pain in left hip: Secondary | ICD-10-CM | POA: Diagnosis not present

## 2022-08-15 DIAGNOSIS — G3183 Dementia with Lewy bodies: Secondary | ICD-10-CM | POA: Diagnosis not present

## 2022-08-15 DIAGNOSIS — M19011 Primary osteoarthritis, right shoulder: Secondary | ICD-10-CM | POA: Diagnosis not present

## 2022-08-15 DIAGNOSIS — F02B2 Dementia in other diseases classified elsewhere, moderate, with psychotic disturbance: Secondary | ICD-10-CM | POA: Diagnosis not present

## 2022-08-17 DIAGNOSIS — G3183 Dementia with Lewy bodies: Secondary | ICD-10-CM | POA: Diagnosis not present

## 2022-08-17 DIAGNOSIS — M25552 Pain in left hip: Secondary | ICD-10-CM | POA: Diagnosis not present

## 2022-08-17 DIAGNOSIS — F419 Anxiety disorder, unspecified: Secondary | ICD-10-CM | POA: Diagnosis not present

## 2022-08-17 DIAGNOSIS — F02B3 Dementia in other diseases classified elsewhere, moderate, with mood disturbance: Secondary | ICD-10-CM | POA: Diagnosis not present

## 2022-08-17 DIAGNOSIS — G309 Alzheimer's disease, unspecified: Secondary | ICD-10-CM | POA: Diagnosis not present

## 2022-08-17 DIAGNOSIS — F02B4 Dementia in other diseases classified elsewhere, moderate, with anxiety: Secondary | ICD-10-CM | POA: Diagnosis not present

## 2022-08-17 DIAGNOSIS — G20B2 Parkinson's disease with dyskinesia, with fluctuations: Secondary | ICD-10-CM | POA: Diagnosis not present

## 2022-08-17 DIAGNOSIS — M810 Age-related osteoporosis without current pathological fracture: Secondary | ICD-10-CM | POA: Diagnosis not present

## 2022-08-17 DIAGNOSIS — E039 Hypothyroidism, unspecified: Secondary | ICD-10-CM | POA: Diagnosis not present

## 2022-08-17 DIAGNOSIS — F02B2 Dementia in other diseases classified elsewhere, moderate, with psychotic disturbance: Secondary | ICD-10-CM | POA: Diagnosis not present

## 2022-08-17 DIAGNOSIS — M19011 Primary osteoarthritis, right shoulder: Secondary | ICD-10-CM | POA: Diagnosis not present

## 2022-08-23 DIAGNOSIS — M19011 Primary osteoarthritis, right shoulder: Secondary | ICD-10-CM | POA: Diagnosis not present

## 2022-08-23 DIAGNOSIS — E039 Hypothyroidism, unspecified: Secondary | ICD-10-CM | POA: Diagnosis not present

## 2022-08-23 DIAGNOSIS — F02B3 Dementia in other diseases classified elsewhere, moderate, with mood disturbance: Secondary | ICD-10-CM | POA: Diagnosis not present

## 2022-08-23 DIAGNOSIS — M25552 Pain in left hip: Secondary | ICD-10-CM | POA: Diagnosis not present

## 2022-08-23 DIAGNOSIS — G309 Alzheimer's disease, unspecified: Secondary | ICD-10-CM | POA: Diagnosis not present

## 2022-08-23 DIAGNOSIS — F02B4 Dementia in other diseases classified elsewhere, moderate, with anxiety: Secondary | ICD-10-CM | POA: Diagnosis not present

## 2022-08-23 DIAGNOSIS — F02B2 Dementia in other diseases classified elsewhere, moderate, with psychotic disturbance: Secondary | ICD-10-CM | POA: Diagnosis not present

## 2022-08-23 DIAGNOSIS — G3183 Dementia with Lewy bodies: Secondary | ICD-10-CM | POA: Diagnosis not present

## 2022-08-23 DIAGNOSIS — M810 Age-related osteoporosis without current pathological fracture: Secondary | ICD-10-CM | POA: Diagnosis not present

## 2022-08-25 DIAGNOSIS — G3183 Dementia with Lewy bodies: Secondary | ICD-10-CM | POA: Diagnosis not present

## 2022-08-25 DIAGNOSIS — F02B4 Dementia in other diseases classified elsewhere, moderate, with anxiety: Secondary | ICD-10-CM | POA: Diagnosis not present

## 2022-08-25 DIAGNOSIS — G309 Alzheimer's disease, unspecified: Secondary | ICD-10-CM | POA: Diagnosis not present

## 2022-08-25 DIAGNOSIS — F02B2 Dementia in other diseases classified elsewhere, moderate, with psychotic disturbance: Secondary | ICD-10-CM | POA: Diagnosis not present

## 2022-08-25 DIAGNOSIS — F02B3 Dementia in other diseases classified elsewhere, moderate, with mood disturbance: Secondary | ICD-10-CM | POA: Diagnosis not present

## 2022-08-25 DIAGNOSIS — M810 Age-related osteoporosis without current pathological fracture: Secondary | ICD-10-CM | POA: Diagnosis not present

## 2022-08-25 DIAGNOSIS — M19011 Primary osteoarthritis, right shoulder: Secondary | ICD-10-CM | POA: Diagnosis not present

## 2022-08-25 DIAGNOSIS — M25552 Pain in left hip: Secondary | ICD-10-CM | POA: Diagnosis not present

## 2022-08-25 DIAGNOSIS — E039 Hypothyroidism, unspecified: Secondary | ICD-10-CM | POA: Diagnosis not present

## 2022-08-31 DIAGNOSIS — M810 Age-related osteoporosis without current pathological fracture: Secondary | ICD-10-CM | POA: Diagnosis not present

## 2022-08-31 DIAGNOSIS — F02B3 Dementia in other diseases classified elsewhere, moderate, with mood disturbance: Secondary | ICD-10-CM | POA: Diagnosis not present

## 2022-08-31 DIAGNOSIS — G309 Alzheimer's disease, unspecified: Secondary | ICD-10-CM | POA: Diagnosis not present

## 2022-08-31 DIAGNOSIS — G3183 Dementia with Lewy bodies: Secondary | ICD-10-CM | POA: Diagnosis not present

## 2022-08-31 DIAGNOSIS — F02B2 Dementia in other diseases classified elsewhere, moderate, with psychotic disturbance: Secondary | ICD-10-CM | POA: Diagnosis not present

## 2022-08-31 DIAGNOSIS — F02B4 Dementia in other diseases classified elsewhere, moderate, with anxiety: Secondary | ICD-10-CM | POA: Diagnosis not present

## 2022-08-31 DIAGNOSIS — M19011 Primary osteoarthritis, right shoulder: Secondary | ICD-10-CM | POA: Diagnosis not present

## 2022-08-31 DIAGNOSIS — E039 Hypothyroidism, unspecified: Secondary | ICD-10-CM | POA: Diagnosis not present

## 2022-08-31 DIAGNOSIS — M25552 Pain in left hip: Secondary | ICD-10-CM | POA: Diagnosis not present

## 2022-09-01 DIAGNOSIS — M25552 Pain in left hip: Secondary | ICD-10-CM | POA: Diagnosis not present

## 2022-09-01 DIAGNOSIS — M19011 Primary osteoarthritis, right shoulder: Secondary | ICD-10-CM | POA: Diagnosis not present

## 2022-09-01 DIAGNOSIS — G309 Alzheimer's disease, unspecified: Secondary | ICD-10-CM | POA: Diagnosis not present

## 2022-09-01 DIAGNOSIS — F02B2 Dementia in other diseases classified elsewhere, moderate, with psychotic disturbance: Secondary | ICD-10-CM | POA: Diagnosis not present

## 2022-09-01 DIAGNOSIS — F02B3 Dementia in other diseases classified elsewhere, moderate, with mood disturbance: Secondary | ICD-10-CM | POA: Diagnosis not present

## 2022-09-01 DIAGNOSIS — E039 Hypothyroidism, unspecified: Secondary | ICD-10-CM | POA: Diagnosis not present

## 2022-09-01 DIAGNOSIS — F02B4 Dementia in other diseases classified elsewhere, moderate, with anxiety: Secondary | ICD-10-CM | POA: Diagnosis not present

## 2022-09-01 DIAGNOSIS — G3183 Dementia with Lewy bodies: Secondary | ICD-10-CM | POA: Diagnosis not present

## 2022-09-01 DIAGNOSIS — R350 Frequency of micturition: Secondary | ICD-10-CM | POA: Diagnosis not present

## 2022-09-01 DIAGNOSIS — M810 Age-related osteoporosis without current pathological fracture: Secondary | ICD-10-CM | POA: Diagnosis not present

## 2022-09-07 DIAGNOSIS — R350 Frequency of micturition: Secondary | ICD-10-CM | POA: Diagnosis not present

## 2022-09-07 DIAGNOSIS — R351 Nocturia: Secondary | ICD-10-CM | POA: Diagnosis not present

## 2022-09-08 DIAGNOSIS — G309 Alzheimer's disease, unspecified: Secondary | ICD-10-CM | POA: Diagnosis not present

## 2022-09-08 DIAGNOSIS — M19011 Primary osteoarthritis, right shoulder: Secondary | ICD-10-CM | POA: Diagnosis not present

## 2022-09-08 DIAGNOSIS — M25552 Pain in left hip: Secondary | ICD-10-CM | POA: Diagnosis not present

## 2022-09-08 DIAGNOSIS — E039 Hypothyroidism, unspecified: Secondary | ICD-10-CM | POA: Diagnosis not present

## 2022-09-08 DIAGNOSIS — M810 Age-related osteoporosis without current pathological fracture: Secondary | ICD-10-CM | POA: Diagnosis not present

## 2022-09-08 DIAGNOSIS — F02B4 Dementia in other diseases classified elsewhere, moderate, with anxiety: Secondary | ICD-10-CM | POA: Diagnosis not present

## 2022-09-08 DIAGNOSIS — F02B2 Dementia in other diseases classified elsewhere, moderate, with psychotic disturbance: Secondary | ICD-10-CM | POA: Diagnosis not present

## 2022-09-08 DIAGNOSIS — F02B3 Dementia in other diseases classified elsewhere, moderate, with mood disturbance: Secondary | ICD-10-CM | POA: Diagnosis not present

## 2022-09-08 DIAGNOSIS — G3183 Dementia with Lewy bodies: Secondary | ICD-10-CM | POA: Diagnosis not present

## 2022-09-09 ENCOUNTER — Telehealth: Payer: Self-pay | Admitting: Internal Medicine

## 2022-09-09 NOTE — Telephone Encounter (Signed)
Ulice Brilliant - Speech Therapist with [facility] [phone] *Ok to leave a detailed message on this line  Verbal Orders - Speech Therapy  1 x a week for 1 week for re-certification

## 2022-09-12 NOTE — Telephone Encounter (Signed)
Verbal orders given to [employer].

## 2022-09-13 ENCOUNTER — Telehealth: Payer: Self-pay | Admitting: Internal Medicine

## 2022-09-13 DIAGNOSIS — M810 Age-related osteoporosis without current pathological fracture: Secondary | ICD-10-CM | POA: Diagnosis not present

## 2022-09-13 DIAGNOSIS — M19011 Primary osteoarthritis, right shoulder: Secondary | ICD-10-CM | POA: Diagnosis not present

## 2022-09-13 DIAGNOSIS — F02B4 Dementia in other diseases classified elsewhere, moderate, with anxiety: Secondary | ICD-10-CM | POA: Diagnosis not present

## 2022-09-13 DIAGNOSIS — F02B2 Dementia in other diseases classified elsewhere, moderate, with psychotic disturbance: Secondary | ICD-10-CM | POA: Diagnosis not present

## 2022-09-13 DIAGNOSIS — G309 Alzheimer's disease, unspecified: Secondary | ICD-10-CM | POA: Diagnosis not present

## 2022-09-13 DIAGNOSIS — G3183 Dementia with Lewy bodies: Secondary | ICD-10-CM | POA: Diagnosis not present

## 2022-09-13 DIAGNOSIS — M25552 Pain in left hip: Secondary | ICD-10-CM | POA: Diagnosis not present

## 2022-09-13 DIAGNOSIS — F02B3 Dementia in other diseases classified elsewhere, moderate, with mood disturbance: Secondary | ICD-10-CM | POA: Diagnosis not present

## 2022-09-13 DIAGNOSIS — E039 Hypothyroidism, unspecified: Secondary | ICD-10-CM | POA: Diagnosis not present

## 2022-09-13 NOTE — Telephone Encounter (Signed)
Pt fell yesterday and hurt her lower back pain 8 out of 10 with no injuries. Pt has an appt tomorrow

## 2022-09-13 NOTE — Telephone Encounter (Signed)
Speech therapy 1x4/1 every other week for 4 weeks

## 2022-09-14 ENCOUNTER — Ambulatory Visit (INDEPENDENT_AMBULATORY_CARE_PROVIDER_SITE_OTHER): Payer: Medicare PPO | Admitting: Internal Medicine

## 2022-09-14 ENCOUNTER — Encounter: Payer: Self-pay | Admitting: Internal Medicine

## 2022-09-14 VITALS — BP 130/84 | HR 70 | Temp 98.2°F | Ht 65.0 in | Wt 123.2 lb

## 2022-09-14 DIAGNOSIS — G20B2 Parkinson's disease with dyskinesia, with fluctuations: Secondary | ICD-10-CM

## 2022-09-14 DIAGNOSIS — M810 Age-related osteoporosis without current pathological fracture: Secondary | ICD-10-CM

## 2022-09-14 DIAGNOSIS — E039 Hypothyroidism, unspecified: Secondary | ICD-10-CM

## 2022-09-14 DIAGNOSIS — E559 Vitamin D deficiency, unspecified: Secondary | ICD-10-CM

## 2022-09-14 DIAGNOSIS — E538 Deficiency of other specified B group vitamins: Secondary | ICD-10-CM | POA: Diagnosis not present

## 2022-09-14 DIAGNOSIS — R296 Repeated falls: Secondary | ICD-10-CM

## 2022-09-14 DIAGNOSIS — Z0000 Encounter for general adult medical examination without abnormal findings: Secondary | ICD-10-CM | POA: Diagnosis not present

## 2022-09-14 LAB — CBC WITH DIFFERENTIAL/PLATELET
Basophils Absolute: 0 10*3/uL (ref 0.0–0.1)
Basophils Relative: 0.7 % (ref 0.0–3.0)
Eosinophils Absolute: 0.1 10*3/uL (ref 0.0–0.7)
Eosinophils Relative: 2 % (ref 0.0–5.0)
HCT: 33.5 % — ABNORMAL LOW (ref 36.0–46.0)
Hemoglobin: 11.5 g/dL — ABNORMAL LOW (ref 12.0–15.0)
Lymphocytes Relative: 13.5 % (ref 12.0–46.0)
Lymphs Abs: 0.8 10*3/uL (ref 0.7–4.0)
MCHC: 34.4 g/dL (ref 30.0–36.0)
MCV: 86.8 fl (ref 78.0–100.0)
Monocytes Absolute: 0.6 10*3/uL (ref 0.1–1.0)
Monocytes Relative: 10.3 % (ref 3.0–12.0)
Neutro Abs: 4.1 10*3/uL (ref 1.4–7.7)
Neutrophils Relative %: 73.5 % (ref 43.0–77.0)
Platelets: 293 10*3/uL (ref 150.0–400.0)
RBC: 3.86 Mil/uL — ABNORMAL LOW (ref 3.87–5.11)
RDW: 13.9 % (ref 11.5–15.5)
WBC: 5.6 10*3/uL (ref 4.0–10.5)

## 2022-09-14 LAB — LIPID PANEL
Cholesterol: 172 mg/dL (ref 0–200)
HDL: 70 mg/dL (ref >39.00)
LDL Cholesterol: 89 mg/dL (ref 0–99)
NonHDL: 102.49
Total CHOL/HDL Ratio: 2
Triglycerides: 66 mg/dL (ref 0.0–149.0)
VLDL: 13.2 mg/dL (ref 0.0–40.0)

## 2022-09-14 LAB — COMPREHENSIVE METABOLIC PANEL
ALT: 3 U/L (ref 0–35)
AST: 13 U/L (ref 0–37)
Albumin: 4.4 g/dL (ref 3.5–5.2)
Alkaline Phosphatase: 61 U/L (ref 39–117)
BUN: 16 mg/dL (ref 6–23)
CO2: 29 mEq/L (ref 19–32)
Calcium: 9.1 mg/dL (ref 8.4–10.5)
Chloride: 101 mEq/L (ref 96–112)
Creatinine, Ser: 0.65 mg/dL (ref 0.40–1.20)
GFR: 87.78 mL/min (ref >60.00)
Glucose, Bld: 91 mg/dL (ref 70–99)
Potassium: 3.6 mEq/L (ref 3.5–5.1)
Sodium: 140 mEq/L (ref 135–145)
Total Bilirubin: 0.6 mg/dL (ref 0.2–1.2)
Total Protein: 6.8 g/dL (ref 6.0–8.3)

## 2022-09-14 LAB — TSH: TSH: 1.64 u[IU]/mL (ref 0.35–5.50)

## 2022-09-14 LAB — VITAMIN D 25 HYDROXY (VIT D DEFICIENCY, FRACTURES): VITD: 35.11 ng/mL (ref 30.00–100.00)

## 2022-09-14 LAB — VITAMIN B12: Vitamin B-12: 195 pg/mL — ABNORMAL LOW (ref 211–911)

## 2022-09-14 NOTE — Telephone Encounter (Signed)
Left detailed message on machine for Ms Kelly Howard with verbal orders.

## 2022-09-14 NOTE — Progress Notes (Signed)
Established Patient Office Visit     CC/Reason for Visit: Annual preventive exam and subsequent Medicare wellness visit  HPI: Kelly Howard is a 72 y.o. female who is coming in today for the above mentioned reasons. Past Medical History is significant for: Parkinson's disease with frequent falls followed by [facility] neurology, Dr. Elesa Massed, generalized anxiety disorder followed by psychiatry, hypothyroidism, vitamin D and B12 deficiency as well as osteoporosis.  She has routine eye and dental care, no perceived hearing deficits, she sees a dermatologist once a year.  She is due for RSV vaccine.   Past Medical/Surgical History: Past Medical History:  Diagnosis Date   Frequent falls    Hypothyroidism    Osteoporosis    Parkinson's disease     Past Surgical History:  Procedure Laterality Date   TONSILLECTOMY     WRIST SURGERY      Social History:  reports that she has never smoked. She has been exposed to tobacco smoke. She has never used smokeless tobacco. She reports current alcohol use. She reports that she does not use drugs.  Allergies: Allergies  Allergen Reactions   Codeine Other (See Comments)    GI Upset   Escitalopram Swelling and Other (See Comments)    Feet swell   Propoxyphene Other (See Comments)    Hallucinations    Family History:  Family History  Problem Relation Age of Onset   Hypercalcemia Mother    Cancer Paternal Grandmother    Diabetes Paternal Grandfather      Current Outpatient Medications:    acetaminophen (TYLENOL) 325 MG tablet, Take 325-650 mg by mouth every 6 (six) hours as needed for mild pain., Disp: , Rfl:    AMBULATORY NON FORMULARY MEDICATION, Abdominal compression binder Dx: G20, Disp: 1 Device, Rfl: 0   carbidopa-levodopa (SINEMET CR) 50-200 MG tablet, TAKE 1 TABLET BY MOUTH EVERYDAY AT BEDTIME (Patient taking differently: Take 1 tablet by mouth at bedtime.), Disp: 90 tablet, Rfl: 1   carbidopa-levodopa (SINEMET IR)  25-100 MG tablet, TAKE 1.5 TABLETS BY MOUTH 5 (FIVE) TIMES DAILY. 6am/9am/noon/3pm/5pm (Patient taking differently: Take 1.5 tablets by mouth See admin instructions. Take 1.5 tablets by mouth at 6 AM, 9 AM, 12 NOON, 3 PM, and 6 PM), Disp: 675 tablet, Rfl: 1   ibandronate (BONIVA) 150 MG tablet, Take 150 mg by mouth every 30 (thirty) days., Disp: , Rfl:    levothyroxine (SYNTHROID, LEVOTHROID) 50 MCG tablet, Take 50 mcg by mouth daily before breakfast., Disp: , Rfl:    NUPLAZID 34 MG CAPS, Take 1 capsule by mouth daily., Disp: , Rfl:    rivastigmine (EXELON) 4.6 mg/24hr, 4.6 mg daily., Disp: , Rfl:    sertraline (ZOLOFT) 100 MG tablet, TAKE TWO TABLETS DAILY., Disp: 180 tablet, Rfl: 3   traZODone (DESYREL) 50 MG tablet, TAKE 1 TO 2 TABLETS BY MOUTH EVERY NIGHT., Disp: 180 tablet, Rfl: 3  Review of Systems:  Negative unless indicated in HPI.   Physical Exam: Vitals:   09/14/22 0902  BP: 130/84  Pulse: 70  Temp: 98.2 F (36.8 C)  TempSrc: Oral  SpO2: 96%  Weight: 123 lb 3.2 oz (55.9 kg)  Height:  (1.651 m)    Body mass index is 20.5 kg/m.   Physical Exam Vitals reviewed.  Constitutional:      General: She is not in acute distress.    Appearance: Normal appearance. She is not ill-appearing, toxic-appearing or diaphoretic.  HENT:     Head: Normocephalic.  Right Ear: Tympanic membrane, ear canal and external ear normal. There is no impacted cerumen.     Left Ear: Tympanic membrane, ear canal and external ear normal. There is no impacted cerumen.     Nose: Nose normal.     Mouth/Throat:     Mouth: Mucous membranes are moist.     Pharynx: Oropharynx is clear. No oropharyngeal exudate or posterior oropharyngeal erythema.  Eyes:     General: No scleral icterus.       Right eye: No discharge.        Left eye: No discharge.     Conjunctiva/sclera: Conjunctivae normal.     Pupils: Pupils are equal, round, and reactive to light.  Neck:     Vascular: No carotid bruit.   Cardiovascular:     Rate and Rhythm: Normal rate and regular rhythm.     Pulses: Normal pulses.     Heart sounds: Normal heart sounds.  Pulmonary:     Effort: Pulmonary effort is normal. No respiratory distress.     Breath sounds: Normal breath sounds.  Abdominal:     General: Abdomen is flat. Bowel sounds are normal.     Palpations: Abdomen is soft.  Musculoskeletal:        General: Normal range of motion.     Cervical back: Normal range of motion.  Skin:    General: Skin is warm and dry.  Neurological:     General: No focal deficit present.     Mental Status: She is alert and oriented to person, place, and time. Mental status is at baseline.  Psychiatric:        Mood and Affect: Mood normal.        Behavior: Behavior normal.        Thought Content: Thought content normal.        Judgment: Judgment normal.      Subsequent Medicare wellness visit   1. Risk factors, based on past  M,S,F - Cardiac Risk Factors include: advanced age (>92men, >70 women)   2.  Physical activities: Dietary issues and exercise activities discussed:  Current Exercise Habits: Home exercise routine;Structured exercise class, Type of exercise: walking, Time (Minutes): 30, Frequency (Times/Week): 4, Weekly Exercise (Minutes/Week): 120, Intensity: Moderate, Exercise limited by: neurologic condition(s)   3.  Depression/mood:  Flowsheet Row Office Visit from 08/10/2021 in [facility] Total Score 9        4.  ADL's:    09/14/2022    9:08 AM 09/14/2022    8:50 AM  In your present state of health, do you have any difficulty performing the following activities:  Hearing? 0 0  Vision? 0 0  Difficulty concentrating or making decisions? 0   Walking or climbing stairs? 1 1  Dressing or bathing? 0 0  Doing errands, shopping? 1 1  Preparing Food and eating ?  N  Using the Toilet?  N  In the past six months, have you accidently leaked urine?  Y  Do you have  problems with loss of bowel control?  N  Managing your Medications?  Y  Managing your Finances?  Y  Housekeeping or managing your Housekeeping?  Y     5.  Fall risk:     07/16/2021   11:07 AM 08/10/2021    1:47 PM 10/29/2021   11:14 AM 05/21/2022    4:43 AM 09/14/2022    8:56 AM  Fall Risk  Falls in the past year?  Number of falls in past year - Comments numerous falls      Was there an injury with Fall? 0  Fall Risk Category Calculator Fall Risk Category (Retired) [employer]     (RETIRED) Patient Fall Risk Level High fall risk High fall risk High fall risk High fall risk   Patient at Risk for Falls Due to History of fall(s);Impaired balance/gait;Impaired mobility Impaired balance/gait History of fall(s);Impaired balance/gait;Impaired mobility    Fall risk Follow up Falls prevention discussed Falls evaluation completed Education provided;Falls prevention discussed  Falls evaluation completed     6.  Home safety: No problems identified   7.  Height weight, and visual acuity: height and weight as above, vision/hearing: Vision Screening   Right eye Left eye Both eyes  Without correction  With correction        8.  Counseling: Counseling given: Not Answered    9. Lab orders based on risk factors: Laboratory update will be reviewed   10. Cognitive assessment:        09/14/2022    8:57 AM  6CIT Screen  What Year? 0 points  What month? 0 points  What time? 3 points  Count back from 20 0 points  Months in reverse 0 points  Repeat phrase 0 points  Total Score 3 points     11. Screening: Patient provided with a written and personalized 5-10 year screening schedule in the AVS. Health Maintenance  Topic Date Due   Hepatitis C Screening: USPSTF Recommendation to screen - Ages 61-79 yo.  Never done   COVID-19 Vaccine (4 - 2023-24 season) 09/30/2022*   Zoster (Shingles) Vaccine (1 of 2) 12/14/2022*   Mammogram  01/27/2023*   Flu Shot   12/29/2022   Medicare Annual Wellness Visit  09/14/2023   DTaP/Tdap/Td vaccine (4 - Td or Tdap) 11/08/2027   Colon Cancer Screening  04/29/2028   Pneumonia Vaccine  Completed   DEXA scan (bone density measurement)  Completed   HPV Vaccine  Aged Out  *Topic was postponed. The date shown is not the original due date.    12. Provider List Update: Patient Care Team    Relationship Specialty Notifications Start End  Philip Aspen, Limmie Patricia, MD PCP - General Internal Medicine  04/11/19   Tat, Octaviano Batty, DO Consulting Physician Neurology  06/10/19      13. Advance Directives: Does Patient Have a Medical Advance Directive?: Yes Type of Advance Directive: Living will, Healthcare Power of Attorney, Out of facility DNR (pink MOST or yellow form) Does patient want to make changes to medical advance directive?: No - Patient declined Copy of Healthcare Power of Attorney in Chart?: No - copy requested  14. Opioids: Patient is not on any opioid prescriptions and has no risk factors for a substance use disorder.   15.   Goals      Protect My Health     Timeframe:  Long-Range Goal Priority:  Medium Start Date:                             Expected End Date:                       Follow Up Date 09/14/2023    - schedule and keep appointment for annual check-up    Why is  this important?   Screening tests can find diseases early when they are easier to treat.  Your doctor or nurse will talk with you about which tests are important for you.  Getting shots for common diseases like the flu and shingles will help prevent them.     Notes:          I have personally reviewed and noted the following in the patient's chart:   Medical and social history Use of alcohol, tobacco or illicit drugs  Current medications and supplements Functional ability and status Nutritional status Physical activity Advanced directives List of other physicians Hospitalizations, surgeries, and ER visits in  previous 12 months Vitals Screenings to include cognitive, depression, and falls Referrals and appointments  In addition, I have reviewed and discussed with patient certain preventive protocols, quality metrics, and best practice recommendations. A written personalized care plan for preventive services as well as general preventive health recommendations were provided to patient.  Impression and Plan:  Medicare annual wellness visit, subsequent  Vitamin D deficiency - Plan: Vitamin D, 25-hydroxy  Encounter for preventive health examination  Vitamin B12 deficiency - Plan: Vitamin B12  Hypothyroidism, unspecified type - Plan: TSH  Age-related osteoporosis without current pathological fracture  Parkinson's disease with dyskinesia and fluctuating manifestations - Plan: CBC with Differential/Platelet, Comprehensive metabolic panel, Lipid panel  Frequent falls  -Recommend routine eye and dental care. -Healthy lifestyle discussed in detail. -Labs to be updated today. -Prostate cancer screening: n/a Health Maintenance  Topic Date Due   Hepatitis C Screening: USPSTF Recommendation to screen - Ages 21-79 yo.  Never done   COVID-19 Vaccine (4 - 2023-24 season) 09/30/2022*   Zoster (Shingles) Vaccine (1 of 2) 12/14/2022*   Mammogram  01/27/2023*   Flu Shot  12/29/2022   Medicare Annual Wellness Visit  09/14/2023   DTaP/Tdap/Td vaccine (4 - Td or Tdap) 11/08/2027   Colon Cancer Screening  04/29/2028   Pneumonia Vaccine  Completed   DEXA scan (bone density measurement)  Completed   HPV Vaccine  Aged Out  *Topic was postponed. The date shown is not the original due date.    -Advised to update RSV vaccine. -She is under the care of Dr. Arelia Sneddon, GYN, gets Prolia, bone density and mammogram through his office, will obtain records.      Chaya Jan, MD [facility]

## 2022-09-15 ENCOUNTER — Encounter: Payer: Self-pay | Admitting: Internal Medicine

## 2022-09-15 DIAGNOSIS — F411 Generalized anxiety disorder: Secondary | ICD-10-CM

## 2022-09-15 DIAGNOSIS — F331 Major depressive disorder, recurrent, moderate: Secondary | ICD-10-CM

## 2022-09-26 ENCOUNTER — Ambulatory Visit (INDEPENDENT_AMBULATORY_CARE_PROVIDER_SITE_OTHER): Payer: Medicare PPO

## 2022-09-26 DIAGNOSIS — E538 Deficiency of other specified B group vitamins: Secondary | ICD-10-CM

## 2022-09-26 MED ORDER — CYANOCOBALAMIN 1000 MCG/ML IJ SOLN
1000.0000 ug | Freq: Once | INTRAMUSCULAR | Status: AC
  Administered 2022-09-26: 1000 ug via INTRAMUSCULAR

## 2022-09-26 NOTE — Progress Notes (Signed)
Per orders of Dr. Ardyth Harps, injection of Cyanocobalamin  1,000 mcg/mL given by Vickii Chafe on L deltoid. Patient tolerated injection well.  Next appointment scheduled for 10/03/2022 @2 :00pm  Pt was accompanied by husband. After receiving B12 injection, pt requested to take BP. BP was taken 80/44 on R arm with normal size BP cuff. Pt denied feeling dizzy while sitting and states she was feeling fine.  Address BP to provider. After checking pt's chart, provider advise pt to take fluid as BP is not on BP med. Relay message to pt. About to release pt. Pt states she feels dizzy and had fell 2 nights ago.   Brought sx to Dr. Ardyth Harps' CMA, Mittie Bodo attention. She went in to check on pt then addressed it to Dr. Ardyth Harps. Pt was released by Dr. Ardyth Harps' CMA, Mittie Bodo.

## 2022-09-27 ENCOUNTER — Telehealth: Payer: Self-pay | Admitting: Internal Medicine

## 2022-09-27 NOTE — Telephone Encounter (Signed)
Spouse called to report that Pt has a BP of 95 over 54 and her pulse is 70.  Spouse is asking for a call back to discuss.

## 2022-09-28 NOTE — Telephone Encounter (Signed)
Husband is aware.

## 2022-09-29 DIAGNOSIS — R350 Frequency of micturition: Secondary | ICD-10-CM | POA: Diagnosis not present

## 2022-10-03 ENCOUNTER — Ambulatory Visit (INDEPENDENT_AMBULATORY_CARE_PROVIDER_SITE_OTHER): Payer: Medicare PPO

## 2022-10-03 DIAGNOSIS — E538 Deficiency of other specified B group vitamins: Secondary | ICD-10-CM | POA: Diagnosis not present

## 2022-10-03 MED ORDER — CYANOCOBALAMIN 1000 MCG/ML IJ SOLN
1000.0000 ug | Freq: Once | INTRAMUSCULAR | Status: AC
  Administered 2022-10-03: 1000 ug via INTRAMUSCULAR

## 2022-10-03 NOTE — Progress Notes (Signed)
Per orders of Dr. Hernandez, injection of Cyanocobalamin 1000 mcg given by Lorinda Copland L Galya Dunnigan. Patient tolerated injection well.  

## 2022-10-10 ENCOUNTER — Ambulatory Visit (INDEPENDENT_AMBULATORY_CARE_PROVIDER_SITE_OTHER): Payer: Medicare PPO

## 2022-10-10 DIAGNOSIS — E538 Deficiency of other specified B group vitamins: Secondary | ICD-10-CM

## 2022-10-10 MED ORDER — CYANOCOBALAMIN 1000 MCG/ML IJ SOLN
1000.0000 ug | Freq: Once | INTRAMUSCULAR | Status: AC
  Administered 2022-10-10: 1000 ug via INTRAMUSCULAR

## 2022-10-10 NOTE — Progress Notes (Signed)
Per orders of Dr. Hernandez, injection of Cyanocobalamin 1000 mcg given by Kazden Largo L Maliya Marich. Patient tolerated injection well.  

## 2022-10-15 DIAGNOSIS — R0781 Pleurodynia: Secondary | ICD-10-CM | POA: Diagnosis not present

## 2022-10-15 DIAGNOSIS — S20212A Contusion of left front wall of thorax, initial encounter: Secondary | ICD-10-CM | POA: Diagnosis not present

## 2022-10-15 DIAGNOSIS — S2241XA Multiple fractures of ribs, right side, initial encounter for closed fracture: Secondary | ICD-10-CM | POA: Diagnosis not present

## 2022-10-15 DIAGNOSIS — W07XXXA Fall from chair, initial encounter: Secondary | ICD-10-CM | POA: Diagnosis not present

## 2022-10-15 DIAGNOSIS — W19XXXA Unspecified fall, initial encounter: Secondary | ICD-10-CM | POA: Diagnosis not present

## 2022-10-15 DIAGNOSIS — Z743 Need for continuous supervision: Secondary | ICD-10-CM | POA: Diagnosis not present

## 2022-10-17 ENCOUNTER — Telehealth: Payer: Self-pay | Admitting: Internal Medicine

## 2022-10-17 ENCOUNTER — Ambulatory Visit (INDEPENDENT_AMBULATORY_CARE_PROVIDER_SITE_OTHER): Payer: Medicare PPO

## 2022-10-17 DIAGNOSIS — D2272 Melanocytic nevi of left lower limb, including hip: Secondary | ICD-10-CM | POA: Diagnosis not present

## 2022-10-17 DIAGNOSIS — D225 Melanocytic nevi of trunk: Secondary | ICD-10-CM | POA: Diagnosis not present

## 2022-10-17 DIAGNOSIS — E538 Deficiency of other specified B group vitamins: Secondary | ICD-10-CM | POA: Diagnosis not present

## 2022-10-17 DIAGNOSIS — L814 Other melanin hyperpigmentation: Secondary | ICD-10-CM | POA: Diagnosis not present

## 2022-10-17 DIAGNOSIS — Z85828 Personal history of other malignant neoplasm of skin: Secondary | ICD-10-CM | POA: Diagnosis not present

## 2022-10-17 DIAGNOSIS — L821 Other seborrheic keratosis: Secondary | ICD-10-CM | POA: Diagnosis not present

## 2022-10-17 MED ORDER — CYANOCOBALAMIN 1000 MCG/ML IJ SOLN
1000.0000 ug | Freq: Once | INTRAMUSCULAR | Status: AC
  Administered 2022-10-17: 1000 ug via INTRAMUSCULAR

## 2022-10-17 NOTE — Progress Notes (Signed)
Per orders of Dr. Hernandez, injection of Cyanocobalamin 1000 mcg given by Heavyn Yearsley L Reis Goga. Patient tolerated injection well.  

## 2022-10-17 NOTE — Telephone Encounter (Signed)
Pt's husband came in and stated pt's dermatologist suggest pt should have an ID bracelet.He would like a call back from nurse to discuss how pt should go about obtaining that.

## 2022-10-18 NOTE — Telephone Encounter (Signed)
Left message on machine for patient/husband to return our call.

## 2022-10-19 NOTE — Telephone Encounter (Signed)
Left message on machine for patient to return our call 

## 2022-10-27 DIAGNOSIS — R350 Frequency of micturition: Secondary | ICD-10-CM | POA: Diagnosis not present

## 2022-11-08 ENCOUNTER — Ambulatory Visit (INDEPENDENT_AMBULATORY_CARE_PROVIDER_SITE_OTHER): Payer: Medicare PPO | Admitting: Adult Health

## 2022-11-08 ENCOUNTER — Encounter: Payer: Self-pay | Admitting: Adult Health

## 2022-11-08 DIAGNOSIS — F410 Panic disorder [episodic paroxysmal anxiety] without agoraphobia: Secondary | ICD-10-CM | POA: Diagnosis not present

## 2022-11-08 DIAGNOSIS — F331 Major depressive disorder, recurrent, moderate: Secondary | ICD-10-CM | POA: Diagnosis not present

## 2022-11-08 DIAGNOSIS — F411 Generalized anxiety disorder: Secondary | ICD-10-CM

## 2022-11-08 DIAGNOSIS — G4700 Insomnia, unspecified: Secondary | ICD-10-CM

## 2022-11-08 NOTE — Progress Notes (Signed)
Kelly Howard [id number] March 25, 1951 72 y.o.  Subjective:   Patient ID:  Kelly Howard is a 72 y.o. (DOB 06-Sep-1950) female.  Chief Complaint: No chief complaint on file.   HPI [name] presents to the office today for follow-up of GAD, MDD, panic attacks, and insomnia.  Diagnosed with Parkinson's in 2010 - followed by [facility].   Accompanied by husband.  Describes mood today as "ok". Tearful at times. Pleasant. Mood symptoms - reports depression, anxiety and irritability. Reports some worry, over thinking and rumination. Mood is variable. Stating "I'm doing better". Feels like medications are helpful. Reports missing her mother. Husband supportive. Has a caregiver 30 hours a week. Varying interest and motivation. Recent physical with PCP. Taking medications as prescribed. Energy levels lower. Active, has a regular exercise routine. Enjoys some usual interests and activities. Married. Lives with husband. Has 2 daughters - 3 grandchildren. Spending time with family. Appetite adequate. Weight stable - 123 pounds. Sleeps better some nights than others. Averages 5 to 6 hours. Reports decreased daytime napping. Focus and concentration difficulties - memory and some confusion. Completing tasks. Managing some aspects of household. Retired. Denies SI or HI.  Decreased AH. Reports VH - decreased.    Previous medication trials: Clonazepam, Zoloft, Paxil, Celexa   GAD-7    Flowsheet Row Office Visit from 09/14/2022 in [facility] [place]  Total GAD-7 Score 6      PHQ2-9    Flowsheet Row Office Visit from 09/14/2022 in [facility] [place] Office Visit from 08/10/2021 in [facility] [place] [facility] at [place] Office Visit from 04/19/2021 in [facility] [place] [facility] at [place] Chronic Care Management from 02/12/2021 in [facility] at [place] Chronic Care Management from  08/27/2020 in [facility] at [place]  PHQ-2 Total Score 1 3 4 1 4   PHQ-9 Total Score 7 9 19  -- 7      Flowsheet Row ED from 05/21/2022 in [facility] Emergency Department at [facility] ED from 01/06/2021 in [facility] Emergency Department at [facility]  C-SSRS RISK CATEGORY No Risk No Risk        Review of Systems:  Review of Systems  Musculoskeletal:  Negative for gait problem.  Neurological:  Negative for tremors.  Psychiatric/Behavioral:         Please refer to HPI    Medications: I have reviewed the patient's current medications.  Current Outpatient Medications  Medication Sig Dispense Refill   acetaminophen (TYLENOL) 325 MG tablet Take 325-650 mg by mouth every 6 (six) hours as needed for mild pain.     AMBULATORY NON FORMULARY MEDICATION Abdominal compression binder Dx: G20 1 Device 0   carbidopa-levodopa (SINEMET CR) 50-200 MG tablet TAKE 1 TABLET BY MOUTH EVERYDAY AT BEDTIME (Patient taking differently: Take 1 tablet by mouth at bedtime.) 90 tablet 1   carbidopa-levodopa (SINEMET IR) 25-100 MG tablet TAKE 1.5 TABLETS BY MOUTH 5 (FIVE) TIMES DAILY. 6am/9am/noon/3pm/5pm (Patient taking differently: Take 1.5 tablets by mouth See admin instructions. Take 1.5 tablets by mouth at 6 AM, 9 AM, 12 NOON, 3 PM, and 6 PM) 675 tablet 1   Carbidopa-Levodopa ER (RYTARY) 48.75-195 MG CPCR Take by mouth. 2 capsules at 6:00 am 3 capsules at 12 Noon  3 capsules at 6:00 pm 2 capsules at bedtime     ibandronate (BONIVA) 150 MG tablet Take 150 mg by mouth every 30 (thirty) days.     levothyroxine (  SYNTHROID, LEVOTHROID) 50 MCG tablet Take 50 mcg by mouth daily before breakfast.     NUPLAZID 34 MG CAPS Take 1 capsule by mouth daily.     rivastigmine (EXELON) 4.6 mg/24hr 4.6 mg daily.     sertraline (ZOLOFT) 100 MG tablet TAKE TWO TABLETS DAILY. 180 tablet 3   sertraline (ZOLOFT) 100 MG tablet Take 100 mg by mouth every morning.     traZODone (DESYREL)  50 MG tablet Take 50 mg by mouth at bedtime.     No current facility-administered medications for this visit.    Medication Side Effects: None  Allergies:  Allergies  Allergen Reactions   Codeine Other (See Comments)    GI Upset   Escitalopram Swelling and Other (See Comments)    Feet swell   Propoxyphene Other (See Comments)    Hallucinations    Past Medical History:  Diagnosis Date   Frequent falls    Hypothyroidism    Osteoporosis    Parkinson's disease     Past Medical History, Surgical history, Social history, and Family history were reviewed and updated as appropriate.   Please see review of systems for further details on the patient's review from today.   Objective:   Physical Exam:  LMP  (LMP Unknown) Comment: gyn  Physical Exam Constitutional:      General: She is not in acute distress. Musculoskeletal:        General: No deformity.  Neurological:     Mental Status: She is alert and oriented to person, place, and time.     Coordination: Coordination normal.  Psychiatric:        Attention and Perception: Attention and perception normal. She does not perceive auditory or visual hallucinations.        Mood and Affect: Mood normal. Mood is not anxious or depressed. Affect is not labile, blunt, angry or inappropriate.        Speech: Speech normal.        Behavior: Behavior normal.        Thought Content: Thought content normal. Thought content is not paranoid or delusional. Thought content does not include homicidal or suicidal ideation. Thought content does not include homicidal or suicidal plan.        Cognition and Memory: Cognition and memory normal.        Judgment: Judgment normal.     Comments: Insight intact     Lab Review:     Component Value Date/Time   NA 140 09/14/2022 0931   K 3.6 09/14/2022 0931   CL 101 09/14/2022 0931   CO2 29 09/14/2022 0931   GLUCOSE 91 09/14/2022 0931   BUN 16 09/14/2022 0931   CREATININE 0.65 09/14/2022 0931    CALCIUM 9.1 09/14/2022 0931   PROT 6.8 09/14/2022 0931   ALBUMIN 4.4 09/14/2022 0931   AST 13 09/14/2022 0931   ALT 3 09/14/2022 0931   ALKPHOS 61 09/14/2022 0931   BILITOT 0.6 09/14/2022 0931   GFRNONAA >60 05/21/2022 0545       Component Value Date/Time   WBC 5.6 09/14/2022 0931   RBC 3.86 (L) 09/14/2022 0931   HGB 11.5 (L) 09/14/2022 0931   HCT 33.5 (L) 09/14/2022 0931   PLT 293.0 09/14/2022 0931   MCV 86.8 09/14/2022 0931   MCH 28.5 05/21/2022 0545   MCHC 34.4 09/14/2022 0931   RDW 13.9 09/14/2022 0931   LYMPHSABS 0.8 09/14/2022 0931   MONOABS 0.6 09/14/2022 0931   EOSABS 0.1 09/14/2022 0931  BASOSABS 0.0 09/14/2022 0931    No results found for: "POCLITH", "LITHIUM"   No results found for: "PHENYTOIN", "PHENOBARB", "VALPROATE", "CBMZ"   .res Assessment: Plan:   Plan:  PDMP reviewed  Zoloft 200mg  daily Trazadone 50mg  - take 1 to 2 at hs  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 Greater than 50% of face to face time with patient was spent on counseling and coordination of care.    Patient advised to contact office with any questions, adverse effects, or acute worsening in signs and symptoms.  Discussed potential benefits, risk, and side effects of benzodiazepines to include potential risk of tolerance and dependence, as well as possible drowsiness.  Advised patient not to drive if experiencing drowsiness and to take lowest possible effective dose to minimize risk of dependence and tolerance. There are no diagnoses linked to this encounter.   Please see After Visit Summary for patient specific instructions.  Future Appointments  Date Time Provider Department Center  11/14/2022  2:15 PM [facility]  09/18/2023 10:00 AM Philip Aspen, Limmie Patricia, MD [facility]    No orders of the defined types were placed in this encounter.   -------------------------------

## 2022-11-14 ENCOUNTER — Ambulatory Visit (INDEPENDENT_AMBULATORY_CARE_PROVIDER_SITE_OTHER): Payer: Medicare PPO | Admitting: *Deleted

## 2022-11-14 DIAGNOSIS — E538 Deficiency of other specified B group vitamins: Secondary | ICD-10-CM

## 2022-11-14 MED ORDER — CYANOCOBALAMIN 1000 MCG/ML IJ SOLN
1000.0000 ug | Freq: Once | INTRAMUSCULAR | Status: AC
  Administered 2022-11-14: 1000 ug via INTRAMUSCULAR

## 2022-11-14 NOTE — Progress Notes (Signed)
Per orders of Dr. Hernandez, injection of B12 given by Sokhna Christoph. Patient tolerated injection well.  

## 2022-11-17 DIAGNOSIS — F419 Anxiety disorder, unspecified: Secondary | ICD-10-CM | POA: Diagnosis not present

## 2022-11-17 DIAGNOSIS — R441 Visual hallucinations: Secondary | ICD-10-CM | POA: Diagnosis not present

## 2022-11-17 DIAGNOSIS — G20B2 Parkinson's disease with dyskinesia, with fluctuations: Secondary | ICD-10-CM | POA: Diagnosis not present

## 2022-11-24 DIAGNOSIS — R350 Frequency of micturition: Secondary | ICD-10-CM | POA: Diagnosis not present

## 2022-12-04 DIAGNOSIS — R52 Pain, unspecified: Secondary | ICD-10-CM | POA: Diagnosis not present

## 2022-12-04 DIAGNOSIS — J029 Acute pharyngitis, unspecified: Secondary | ICD-10-CM | POA: Diagnosis not present

## 2022-12-04 DIAGNOSIS — R051 Acute cough: Secondary | ICD-10-CM | POA: Diagnosis not present

## 2022-12-04 DIAGNOSIS — U071 COVID-19: Secondary | ICD-10-CM | POA: Diagnosis not present

## 2022-12-04 DIAGNOSIS — R509 Fever, unspecified: Secondary | ICD-10-CM | POA: Diagnosis not present

## 2022-12-14 ENCOUNTER — Ambulatory Visit: Payer: Medicare PPO

## 2022-12-14 ENCOUNTER — Telehealth: Payer: Self-pay | Admitting: Internal Medicine

## 2022-12-14 DIAGNOSIS — J302 Other seasonal allergic rhinitis: Secondary | ICD-10-CM

## 2022-12-14 DIAGNOSIS — E538 Deficiency of other specified B group vitamins: Secondary | ICD-10-CM | POA: Diagnosis not present

## 2022-12-14 DIAGNOSIS — G20B2 Parkinson's disease with dyskinesia, with fluctuations: Secondary | ICD-10-CM

## 2022-12-14 MED ORDER — CYANOCOBALAMIN 1000 MCG/ML IJ SOLN
1000.0000 ug | Freq: Once | INTRAMUSCULAR | Status: AC
  Administered 2022-12-14: 1000 ug via INTRAMUSCULAR

## 2022-12-14 NOTE — Telephone Encounter (Signed)
Pt's husband is requesting a referral to be placed for Dr. Sibyl Parr at [facility]. Call back number with questions: [phone].

## 2022-12-14 NOTE — Telephone Encounter (Signed)
Referral placed.

## 2022-12-14 NOTE — Progress Notes (Signed)
Per orders of Philip Aspen, Limmie Patricia, MD, injection of B12 given in  Right deltoid by Sherrin Daisy. Patient tolerated injection well.  Lab Results  Component Value Date   VITAMINB12 195 (L) 09/14/2022

## 2022-12-14 NOTE — Patient Instructions (Signed)
Health Maintenance Due  Topic Date Due   Hepatitis C Screening  Never done   COVID-19 Vaccine (4 - 2023-24 season) 01/28/2022       09/14/2022    9:41 AM 08/10/2021    1:46 PM 04/19/2021    2:30 PM  Depression screen PHQ 2/9  Decreased Interest 0 2 2  Down, Depressed, Hopeless 1 1 2   PHQ - 2 Score 1 3 4   Altered sleeping 1 1 2   Tired, decreased energy 2 2 3   Change in appetite 0 0 2  Feeling bad or failure about yourself  1 0 2  Trouble concentrating 1 1 3   Moving slowly or fidgety/restless 1 2 3   Suicidal thoughts 0 0 0  PHQ-9 Score 7 9 19

## 2022-12-22 DIAGNOSIS — R350 Frequency of micturition: Secondary | ICD-10-CM | POA: Diagnosis not present

## 2023-01-13 ENCOUNTER — Ambulatory Visit: Payer: Medicare PPO

## 2023-01-13 VITALS — BP 100/60 | Temp 98.8°F | Wt 114.0 lb

## 2023-01-13 DIAGNOSIS — E538 Deficiency of other specified B group vitamins: Secondary | ICD-10-CM

## 2023-01-13 MED ORDER — CYANOCOBALAMIN 1000 MCG/ML IJ SOLN
1000.0000 ug | Freq: Once | INTRAMUSCULAR | Status: AC
  Administered 2023-01-13: 1000 ug via INTRAMUSCULAR

## 2023-01-13 NOTE — Progress Notes (Signed)
Per orders of Dr. Sarajane Jews, injection of B12 given by Rodrigo Ran. Patient tolerated injection well.

## 2023-01-20 DIAGNOSIS — R350 Frequency of micturition: Secondary | ICD-10-CM | POA: Diagnosis not present

## 2023-01-26 DIAGNOSIS — J029 Acute pharyngitis, unspecified: Secondary | ICD-10-CM | POA: Diagnosis not present

## 2023-01-26 DIAGNOSIS — Z8616 Personal history of COVID-19: Secondary | ICD-10-CM | POA: Diagnosis not present

## 2023-01-26 DIAGNOSIS — R509 Fever, unspecified: Secondary | ICD-10-CM | POA: Diagnosis not present

## 2023-01-27 DIAGNOSIS — D3131 Benign neoplasm of right choroid: Secondary | ICD-10-CM | POA: Diagnosis not present

## 2023-01-27 DIAGNOSIS — H52203 Unspecified astigmatism, bilateral: Secondary | ICD-10-CM | POA: Diagnosis not present

## 2023-01-27 DIAGNOSIS — H2513 Age-related nuclear cataract, bilateral: Secondary | ICD-10-CM | POA: Diagnosis not present

## 2023-02-07 DIAGNOSIS — Z124 Encounter for screening for malignant neoplasm of cervix: Secondary | ICD-10-CM | POA: Diagnosis not present

## 2023-02-07 DIAGNOSIS — Z1231 Encounter for screening mammogram for malignant neoplasm of breast: Secondary | ICD-10-CM | POA: Diagnosis not present

## 2023-02-07 DIAGNOSIS — N3281 Overactive bladder: Secondary | ICD-10-CM | POA: Diagnosis not present

## 2023-02-07 DIAGNOSIS — N959 Unspecified menopausal and perimenopausal disorder: Secondary | ICD-10-CM | POA: Diagnosis not present

## 2023-02-07 DIAGNOSIS — Z681 Body mass index (BMI) 19 or less, adult: Secondary | ICD-10-CM | POA: Diagnosis not present

## 2023-02-13 ENCOUNTER — Ambulatory Visit (INDEPENDENT_AMBULATORY_CARE_PROVIDER_SITE_OTHER): Payer: Medicare PPO

## 2023-02-13 DIAGNOSIS — E538 Deficiency of other specified B group vitamins: Secondary | ICD-10-CM | POA: Diagnosis not present

## 2023-02-13 MED ORDER — CYANOCOBALAMIN 1000 MCG/ML IJ SOLN
1000.0000 ug | Freq: Once | INTRAMUSCULAR | Status: AC
  Administered 2023-02-13: 1000 ug via INTRAMUSCULAR

## 2023-02-13 NOTE — Progress Notes (Signed)
Per orders of Dr. Ardyth Harps , injection of B-12 given by Stann Ore. Patient tolerated injection well.

## 2023-02-14 DIAGNOSIS — R350 Frequency of micturition: Secondary | ICD-10-CM | POA: Diagnosis not present

## 2023-02-16 DIAGNOSIS — R441 Visual hallucinations: Secondary | ICD-10-CM | POA: Diagnosis not present

## 2023-02-16 DIAGNOSIS — F419 Anxiety disorder, unspecified: Secondary | ICD-10-CM | POA: Diagnosis not present

## 2023-02-16 DIAGNOSIS — G20B2 Parkinson's disease with dyskinesia, with fluctuations: Secondary | ICD-10-CM | POA: Diagnosis not present

## 2023-03-16 DIAGNOSIS — R350 Frequency of micturition: Secondary | ICD-10-CM | POA: Diagnosis not present

## 2023-03-20 ENCOUNTER — Ambulatory Visit: Payer: Medicare PPO

## 2023-03-21 ENCOUNTER — Emergency Department (HOSPITAL_COMMUNITY)
Admission: EM | Admit: 2023-03-21 | Discharge: 2023-03-21 | Disposition: A | Discharge status: Home / Self Care | Payer: Medicare PPO | Attending: Emergency Medicine | Admitting: Emergency Medicine

## 2023-03-21 ENCOUNTER — Emergency Department (HOSPITAL_COMMUNITY): Payer: Medicare PPO

## 2023-03-21 ENCOUNTER — Encounter (HOSPITAL_COMMUNITY): Payer: Self-pay

## 2023-03-21 ENCOUNTER — Ambulatory Visit: Payer: Medicare PPO

## 2023-03-21 DIAGNOSIS — R296 Repeated falls: Secondary | ICD-10-CM | POA: Insufficient documentation

## 2023-03-21 DIAGNOSIS — S4992XA Unspecified injury of left shoulder and upper arm, initial encounter: Secondary | ICD-10-CM | POA: Diagnosis present

## 2023-03-21 DIAGNOSIS — Z7722 Contact with and (suspected) exposure to environmental tobacco smoke (acute) (chronic): Secondary | ICD-10-CM | POA: Diagnosis not present

## 2023-03-21 DIAGNOSIS — E039 Hypothyroidism, unspecified: Secondary | ICD-10-CM | POA: Diagnosis not present

## 2023-03-21 DIAGNOSIS — S42032A Displaced fracture of lateral end of left clavicle, initial encounter for closed fracture: Secondary | ICD-10-CM

## 2023-03-21 DIAGNOSIS — W19XXXA Unspecified fall, initial encounter: Secondary | ICD-10-CM | POA: Diagnosis not present

## 2023-03-21 DIAGNOSIS — I700 Atherosclerosis of aorta: Secondary | ICD-10-CM | POA: Diagnosis not present

## 2023-03-21 DIAGNOSIS — Z79899 Other long term (current) drug therapy: Secondary | ICD-10-CM | POA: Insufficient documentation

## 2023-03-21 DIAGNOSIS — G20C Parkinsonism, unspecified: Secondary | ICD-10-CM | POA: Diagnosis not present

## 2023-03-21 DIAGNOSIS — S4292XA Fracture of left shoulder girdle, part unspecified, initial encounter for closed fracture: Secondary | ICD-10-CM | POA: Diagnosis not present

## 2023-03-21 DIAGNOSIS — M1711 Unilateral primary osteoarthritis, right knee: Secondary | ICD-10-CM | POA: Diagnosis not present

## 2023-03-21 NOTE — ED Provider Notes (Signed)
[place] EMERGENCY DEPARTMENT AT [facility] Provider Note  CSN: [id number] Arrival date & time: 03/21/23 0105  Chief Complaint(s) Fall  HPI Kelly Howard is a 72 y.o. female with past medical history as below, significant for frequent falls, Parkinson's disease, osteoporosis who presents to the ED with complaint of fall, shoulder injury  Patient lives with spouse, he was working in his office and heard her fall in a different room.  Patient fell onto her left side, injured her left shoulder and her right knee.  No head injury or LOC.  No blood thinners.  Primarily to medial aspect of left shoulder, lateral left clavicle.  No headache or neck pain.  No chest pain or abdominal pain.  No behavior changes since fall  Past Medical History Past Medical History:  Diagnosis Date   Frequent falls    Hypothyroidism    Osteoporosis    Parkinson's disease [facility])    Patient Active Problem List   Diagnosis Date Noted   Vitamin D deficiency 07/10/2019   Vitamin B12 deficiency 07/10/2019   Hypothyroidism    Frequent falls    Osteoporosis    Closed fracture of right distal radius 12/25/2018   Parkinson disease (HCC) 10/20/2011   Home Medication(s) Prior to Admission medications   Medication Sig Start Date End Date Taking? Authorizing Provider  acetaminophen (TYLENOL) 325 MG tablet Take 325-650 mg by mouth every 6 (six) hours as needed for mild pain.    [provider]  AMBULATORY NON FORMULARY MEDICATION Abdominal compression binder Dx: G20 11/07/19   Tat, Octaviano Batty, DO  carbidopa-levodopa (SINEMET CR) 50-200 MG tablet TAKE 1 TABLET BY MOUTH EVERYDAY AT BEDTIME Patient taking differently: Take 1 tablet by mouth at bedtime. 11/19/20   Tat, Octaviano Batty, DO  carbidopa-levodopa (SINEMET IR) 25-100 MG tablet TAKE 1.5 TABLETS BY MOUTH 5 (FIVE) TIMES DAILY. 6am/9am/noon/3pm/5pm Patient taking differently: Take 1.5 tablets by mouth See admin instructions. Take 1.5 tablets by  mouth at 6 AM, 9 AM, 12 NOON, 3 PM, and 6 PM 11/12/20   Tat, Lurena Joiner S, DO  Carbidopa-Levodopa ER (RYTARY) 48.75-195 MG CPCR Take by mouth. 2 capsules at 6:00 am 3 capsules at 12 Noon  3 capsules at 6:00 pm 2 capsules at bedtime    [provider]  ibandronate (BONIVA) 150 MG tablet Take 150 mg by mouth every 30 (thirty) days. 08/12/19   [provider]  levothyroxine (SYNTHROID, LEVOTHROID) 50 MCG tablet Take 50 mcg by mouth daily before breakfast. 03/09/12   [provider]  NUPLAZID 34 MG CAPS Take 1 capsule by mouth daily. 01/30/21   [provider]  rivastigmine (EXELON) 4.6 mg/24hr 4.6 mg daily. 03/30/21   [provider]  sertraline (ZOLOFT) 100 MG tablet TAKE TWO TABLETS DAILY. 05/09/22   Mozingo, Thereasa Solo, NP  traZODone (DESYREL) 50 MG tablet Take 50 mg by mouth at bedtime.    [provider]  Past Surgical History Past Surgical History:  Procedure Laterality Date   TONSILLECTOMY     WRIST SURGERY     Family History Family History  Problem Relation Age of Onset   Hypercalcemia Mother    Cancer Paternal Grandmother    Diabetes Paternal Grandfather     Social History Social History   Tobacco Use   Smoking status: Never    Passive exposure: Past   Smokeless tobacco: Never  Vaping Use   Vaping status: Never Used  Substance Use Topics   Alcohol use: Yes   Drug use: Never   Allergies Codeine, Escitalopram, and Propoxyphene  Review of Systems Review of Systems  Constitutional:  Negative for chills and fever.  Respiratory:  Negative for chest tightness.   Gastrointestinal:  Negative for abdominal pain and nausea.  Musculoskeletal:  Positive for arthralgias.  Skin:  Negative for wound.  Neurological:  Negative for syncope and headaches.  All other systems reviewed and are  negative.   Physical Exam Vital Signs  I have reviewed the triage vital signs BP 124/76   Pulse 80   Temp 97.9 F (36.6 C)   Resp 16   Ht 5\' 7"  (1.702 m)   Wt 52.2 kg   LMP  (LMP Unknown) Comment: gyn  SpO2 98%   BMI 18.01 kg/m  Physical Exam Vitals and nursing note reviewed.  Constitutional:      General: Kelly Howard is not in acute distress.    Appearance: Normal appearance. Kelly Howard is well-developed. Kelly Howard is not ill-appearing.  HENT:     Head: Normocephalic and atraumatic.     Right Ear: External ear normal.     Left Ear: External ear normal.     Nose: Nose normal.     Mouth/Throat:     Mouth: Mucous membranes are moist.  Eyes:     General: No scleral icterus.       Right eye: No discharge.        Left eye: No discharge.  Cardiovascular:     Rate and Rhythm: Normal rate.  Pulmonary:     Effort: Pulmonary effort is normal. No tachypnea or respiratory distress.     Breath sounds: Normal breath sounds. No stridor.  Abdominal:     General: Abdomen is flat. There is no distension.     Tenderness: There is no guarding.  Musculoskeletal:        General: No deformity.       Arms:     Cervical back: No rigidity. No spinous process tenderness.     Comments: Radial pulses palpable bilateral  No midline spinous process tenderness to palpation or percussion, no crepitus or step-off.    Skin:    General: Skin is warm and dry.     Coloration: Skin is not cyanotic, jaundiced or pale.  Neurological:     Mental Status: Kelly Howard is alert. Mental status is at baseline.     GCS: GCS eye subscore is 4. GCS verbal subscore is 5. GCS motor subscore is 6.  Psychiatric:        Speech: Speech normal.        Behavior: Behavior normal. Behavior is cooperative.     ED Results and Treatments Labs (all labs ordered are listed, but only abnormal results are displayed) Labs Reviewed - No data to display  Radiology DG Shoulder Left  Result Date: 03/21/2023 CLINICAL DATA:  Fall, injury. EXAM: LEFT SHOULDER - 2+ VIEW; RIGHT KNEE - COMPLETE 4+ VIEW COMPARISON:  12/16/2018. FINDINGS: Left shoulder: There is a minimally displaced fracture of the distal left clavicle. The remaining bony structures are intact and there is no dislocation. Aortic atherosclerosis is noted. Right knee: No acute fracture or dislocation. Mild degenerative changes are present in the lateral and patellofemoral compartments. No joint effusion. Soft tissues are within normal limits. IMPRESSION: 1. Mildly displaced fracture of the distal left clavicle. 2. No acute fracture or dislocation at the right knee. Electronically Signed   By: Thornell Sartorius M.D.   On: 03/21/2023 04:17   DG Knee Complete 4 Views Right  Result Date: 03/21/2023 CLINICAL DATA:  Fall, injury. EXAM: LEFT SHOULDER - 2+ VIEW; RIGHT KNEE - COMPLETE 4+ VIEW COMPARISON:  12/16/2018. FINDINGS: Left shoulder: There is a minimally displaced fracture of the distal left clavicle. The remaining bony structures are intact and there is no dislocation. Aortic atherosclerosis is noted. Right knee: No acute fracture or dislocation. Mild degenerative changes are present in the lateral and patellofemoral compartments. No joint effusion. Soft tissues are within normal limits. IMPRESSION: 1. Mildly displaced fracture of the distal left clavicle. 2. No acute fracture or dislocation at the right knee. Electronically Signed   By: Thornell Sartorius M.D.   On: 03/21/2023 04:17    Pertinent labs & imaging results that were available during my care of the patient were reviewed by me and considered in my medical decision making (see MDM for details).  Medications Ordered in ED Medications - No data to display                                                                                                                                   Procedures .Ortho Injury  Treatment  Date/Time: 03/21/2023 5:17 AM  Performed by: Sloan Leiter, DO Authorized by: Sloan Leiter, DO   Consent:    Consent obtained:  Verbal   Consent given by:  Patient   Risks discussed:  Fracture   Alternatives discussed:  No treatment and alternative treatmentInjury location: sternoclavicular Location details: left clavicle Injury type: fracture Pre-procedure neurovascular assessment: neurovascularly intact Pre-procedure distal perfusion: normal Pre-procedure neurological function: normal Pre-procedure range of motion: reduced  Anesthesia: Local anesthesia used: no  Patient sedated: NoManipulation performed: no Immobilization: sling Splint Applied by: ED Provider Post-procedure neurovascular assessment: post-procedure neurovascularly intact Post-procedure distal perfusion: normal Post-procedure neurological function: normal Post-procedure range of motion: unchanged     (including critical care time)  Medical Decision Making / ED Course    Medical Decision Making:    Deprise Novelo is a 72 y.o. female with past medical history as below, significant for frequent falls, Parkinson's disease, osteoporosis who presents to the ED with complaint of fall, shoulder injury. The complaint involves an extensive differential diagnosis and also carries  with it a high risk of complications and morbidity.  Serious etiology was considered. Ddx includes but is not limited to: Sprain, strain, fracture, dislocation, soft tissue injury, etc.  Complete initial physical exam performed, notably the patient  was no acute distress, sitting upright in wheelchair.    Reviewed and confirmed nursing documentation for past medical history, family history, social history.  Vital signs reviewed.        Imaging ordered in triage, patient with apparent left lateral clavicle fracture is mildly displaced.  Fracture is closed.  No skin tenting.  Knee x-ray is unremarkable.  Patient  placed in sling, advised follow-up with orthopedics Delbert Harness, discussed supportive care at home with spouse  The patient improved significantly and was discharged in stable condition. Detailed discussions were had with the patient regarding current findings, and need for close f/u with PCP or on call doctor. The patient has been instructed to return immediately if the symptoms worsen in any way for re-evaluation. Patient verbalized understanding and is in agreement with current care plan. All questions answered prior to discharge.                   Additional history obtained: -Additional history obtained from spouse -External records from outside source obtained and reviewed including: Chart review including previous notes, labs, imaging, consultation notes including  Primary care documentation Home medications-no thinners   Lab Tests: na  EKG   EKG Interpretation Date/Time:    Ventricular Rate:    PR Interval:    QRS Duration:    QT Interval:    QTC Calculation:   R Axis:      Text Interpretation:           Imaging Studies ordered: Imaging studies ordered in triage including shoulder x-ray and knee x-ray I independently visualized the following imaging with scope of interpretation limited to determining acute life threatening conditions related to emergency care; findings noted above I independently visualized and interpreted imaging. I agree with the radiologist interpretation   Medicines ordered and prescription drug management: No orders of the defined types were placed in this encounter.   -I have reviewed the patients home medicines and have made adjustments as needed   Consultations Obtained: na   Cardiac Monitoring: Continuous pulse oximetry interpreted by myself, 99% on RA.    Social Determinants of Health:  Diagnosis or treatment significantly limited by social determinants of health: lives at home w/  spouse   Reevaluation: After the interventions noted above, I reevaluated the patient and found that they have improved  Co morbidities that complicate the patient evaluation  Past Medical History:  Diagnosis Date   Frequent falls    Hypothyroidism    Osteoporosis    Parkinson's disease (HCC)       Dispostion: Disposition decision including need for hospitalization was considered, and patient discharged from emergency department.    Final Clinical Impression(s) / ED Diagnoses Final diagnoses:  Closed displaced fracture of acromial end of left clavicle, initial encounter  Fall, initial encounter        Sloan Leiter, DO 03/21/23 0518

## 2023-03-21 NOTE — ED Notes (Signed)
Ortho tech called for a shoulder immobilizer/sling

## 2023-03-21 NOTE — ED Triage Notes (Addendum)
Pt arrived POV after falling earlier tonight around 11:30 pm. Husband reports pt tripped over her purse, straps to purse was tangle around her feet. Pt c/o left shoulder pain and right knee. Denies LOC, did not hit her head, not on blood thinners. Pt has hx of parkinson, hx of falls. A&O x4, NAD noted, VSS. Possible dislocation to left shoulder, pt has hard time trying to straighten her left arm, CNS intact, left radial pulse present.

## 2023-03-21 NOTE — Progress Notes (Signed)
Orthopedic Tech Progress Note Patient Details:  Kelly Howard 17-May-1951 [id number] Applied shoulder immobilizer per order.  Ortho Devices Type of Ortho Device: Sling immobilizer Ortho Device/Splint Location: LUE Ortho Device/Splint Interventions: Ordered, Application, Adjustment   Post Interventions Patient Tolerated: Well Instructions Provided: Adjustment of device, Care of device  Blase Mess 03/21/2023, 5:15 AM

## 2023-03-21 NOTE — ED Notes (Signed)
Dr. Wallace Cullens to triage to assess pt and d/c her form triage, ortho tech at bedside for sling placement

## 2023-03-21 NOTE — Discharge Instructions (Signed)
You have a fracture to the lateral aspect of your left collarbone/clavicle.  This will likely heal without surgery.  Please wear sling until seen by orthopedics.  You may take Motrin or Tylenol as needed for any discomfort.  It was a pleasure caring for you today in the emergency department.  Please return to the emergency department for any worsening or worrisome symptoms.

## 2023-03-22 ENCOUNTER — Encounter: Payer: Self-pay | Admitting: Internal Medicine

## 2023-03-22 ENCOUNTER — Ambulatory Visit: Payer: Medicare PPO | Admitting: Internal Medicine

## 2023-03-22 VITALS — BP 110/80 | HR 64 | Temp 97.9°F | Wt 111.3 lb

## 2023-03-22 DIAGNOSIS — Z23 Encounter for immunization: Secondary | ICD-10-CM

## 2023-03-22 DIAGNOSIS — E538 Deficiency of other specified B group vitamins: Secondary | ICD-10-CM

## 2023-03-22 DIAGNOSIS — Z09 Encounter for follow-up examination after completed treatment for conditions other than malignant neoplasm: Secondary | ICD-10-CM | POA: Diagnosis not present

## 2023-03-22 DIAGNOSIS — G20B2 Parkinson's disease with dyskinesia, with fluctuations: Secondary | ICD-10-CM

## 2023-03-22 DIAGNOSIS — S42035D Nondisplaced fracture of lateral end of left clavicle, subsequent encounter for fracture with routine healing: Secondary | ICD-10-CM

## 2023-03-22 DIAGNOSIS — R296 Repeated falls: Secondary | ICD-10-CM | POA: Diagnosis not present

## 2023-03-22 DIAGNOSIS — M810 Age-related osteoporosis without current pathological fracture: Secondary | ICD-10-CM | POA: Diagnosis not present

## 2023-03-22 MED ORDER — CYANOCOBALAMIN 1000 MCG/ML IJ SOLN
1000.0000 ug | Freq: Once | INTRAMUSCULAR | Status: AC
  Administered 2023-03-22: 1000 ug via INTRAMUSCULAR

## 2023-03-22 NOTE — Addendum Note (Signed)
Addended by: Kern Reap B on: 03/22/2023 09:49 AM   Modules accepted: Orders

## 2023-03-22 NOTE — Addendum Note (Signed)
Addended by: Kern Reap B on: 03/22/2023 09:58 AM   Modules accepted: Orders

## 2023-03-22 NOTE — Progress Notes (Signed)
Established Patient Office Visit     CC/Reason for Visit: ED follow-up  HPI: Kelly Howard is a 72 y.o. female who is coming in today for the above mentioned reasons. Past Medical History is significant for: Parkinson's disease.  2 days ago she suffered a fall at home that resulted in a left clavicular fracture.  She was placed in a sling and has follow-up scheduled with orthopedics.  She is due for flu vaccine and a B12 injection.  Her husband is requesting home health assistance.   Past Medical/Surgical History: Past Medical History:  Diagnosis Date   Frequent falls    Hypothyroidism    Osteoporosis    Parkinson's disease (HCC)     Past Surgical History:  Procedure Laterality Date   TONSILLECTOMY     WRIST SURGERY      Social History:  reports that she has never smoked. She has been exposed to tobacco smoke. She has never used smokeless tobacco. She reports current alcohol use. She reports that she does not use drugs.  Allergies: Allergies  Allergen Reactions   Codeine Other (See Comments)    GI Upset   Escitalopram Swelling and Other (See Comments)    Feet swell   Propoxyphene Other (See Comments)    Hallucinations    Family History:  Family History  Problem Relation Age of Onset   Hypercalcemia Mother    Cancer Paternal Grandmother    Diabetes Paternal Grandfather      Current Outpatient Medications:    acetaminophen (TYLENOL) 325 MG tablet, Take 325-650 mg by mouth every 6 (six) hours as needed for mild pain., Disp: , Rfl:    AMBULATORY NON FORMULARY MEDICATION, Abdominal compression binder Dx: G20, Disp: 1 Device, Rfl: 0   carbidopa-levodopa (SINEMET CR) 50-200 MG tablet, TAKE 1 TABLET BY MOUTH EVERYDAY AT BEDTIME (Patient taking differently: Take 1 tablet by mouth at bedtime.), Disp: 90 tablet, Rfl: 1   carbidopa-levodopa (SINEMET IR) 25-100 MG tablet, TAKE 1.5 TABLETS BY MOUTH 5 (FIVE) TIMES DAILY. 6am/9am/noon/3pm/5pm (Patient taking  differently: Take 1.5 tablets by mouth See admin instructions. Take 1.5 tablets by mouth at 6 AM, 9 AM, 12 NOON, 3 PM, and 6 PM), Disp: 675 tablet, Rfl: 1   Carbidopa-Levodopa ER (RYTARY) 48.75-195 MG CPCR, Take by mouth. 2 capsules at 6:00 am 3 capsules at 12 Noon  3 capsules at 6:00 pm 2 capsules at bedtime, Disp: , Rfl:    ibandronate (BONIVA) 150 MG tablet, Take 150 mg by mouth every 30 (thirty) days., Disp: , Rfl:    levothyroxine (SYNTHROID, LEVOTHROID) 50 MCG tablet, Take 50 mcg by mouth daily before breakfast., Disp: , Rfl:    NUPLAZID 34 MG CAPS, Take 1 capsule by mouth daily., Disp: , Rfl:    rivastigmine (EXELON) 4.6 mg/24hr, 4.6 mg daily., Disp: , Rfl:    sertraline (ZOLOFT) 100 MG tablet, TAKE TWO TABLETS DAILY., Disp: 180 tablet, Rfl: 3   traZODone (DESYREL) 50 MG tablet, Take 50 mg by mouth at bedtime., Disp: , Rfl:   Review of Systems:  Negative unless indicated in HPI.   Physical Exam: Vitals:   03/22/23 0837  BP: 110/80  Pulse: 64  Temp: 97.9 F (36.6 C)  TempSrc: Oral  SpO2: 98%  Weight: 111 lb 4.8 oz (50.5 kg)    Body mass index is 17.43 kg/m.   Physical Exam Chest:     Comments: Bruising around left upper thoracic chest wall.     Impression and  Plan:  Hospital discharge follow-up  Parkinson's disease with dyskinesia and fluctuating manifestations (HCC)  Frequent falls  Nondisplaced fracture of lateral end of left clavicle, subsequent encounter for fracture with routine healing  Age-related osteoporosis without current pathological fracture  Vitamin B12 deficiency  Immunization due   -ED charts reviewed in detail. -Fall likely precipitated by Parkinson's disease and gait instability.  Resulted in a left clavicle fracture.  She is wearing a splint and will be following up with orthopedics later this week. -Husband is requesting home health to include PT/OT/aide. -Flu vaccine administered in office today. -IM B12 administered in office  today.  Time spent:31 minutes reviewing chart, interviewing and examining patient and formulating plan of care.     Chaya Jan, MD [facility] at [facility]

## 2023-03-24 DIAGNOSIS — M25512 Pain in left shoulder: Secondary | ICD-10-CM | POA: Diagnosis not present

## 2023-03-26 ENCOUNTER — Emergency Department (HOSPITAL_BASED_OUTPATIENT_CLINIC_OR_DEPARTMENT_OTHER): Payer: Medicare PPO

## 2023-03-26 ENCOUNTER — Encounter (HOSPITAL_BASED_OUTPATIENT_CLINIC_OR_DEPARTMENT_OTHER): Payer: Self-pay

## 2023-03-26 ENCOUNTER — Observation Stay (HOSPITAL_BASED_OUTPATIENT_CLINIC_OR_DEPARTMENT_OTHER)
Admission: EM | Admit: 2023-03-26 | Discharge: 2023-03-28 | Disposition: A | Discharge status: Home Health | Payer: Medicare PPO | Attending: Internal Medicine | Admitting: Internal Medicine

## 2023-03-26 DIAGNOSIS — R9431 Abnormal electrocardiogram [ECG] [EKG]: Secondary | ICD-10-CM | POA: Diagnosis not present

## 2023-03-26 DIAGNOSIS — E039 Hypothyroidism, unspecified: Secondary | ICD-10-CM | POA: Insufficient documentation

## 2023-03-26 DIAGNOSIS — Z1152 Encounter for screening for COVID-19: Secondary | ICD-10-CM | POA: Insufficient documentation

## 2023-03-26 DIAGNOSIS — R55 Syncope and collapse: Secondary | ICD-10-CM | POA: Diagnosis not present

## 2023-03-26 DIAGNOSIS — R4182 Altered mental status, unspecified: Secondary | ICD-10-CM | POA: Diagnosis not present

## 2023-03-26 DIAGNOSIS — S2241XA Multiple fractures of ribs, right side, initial encounter for closed fracture: Secondary | ICD-10-CM | POA: Diagnosis not present

## 2023-03-26 DIAGNOSIS — S0990XA Unspecified injury of head, initial encounter: Secondary | ICD-10-CM | POA: Diagnosis not present

## 2023-03-26 DIAGNOSIS — R8281 Pyuria: Secondary | ICD-10-CM | POA: Diagnosis not present

## 2023-03-26 DIAGNOSIS — G20C Parkinsonism, unspecified: Secondary | ICD-10-CM | POA: Diagnosis not present

## 2023-03-26 DIAGNOSIS — R531 Weakness: Secondary | ICD-10-CM | POA: Diagnosis present

## 2023-03-26 DIAGNOSIS — F0280 Dementia in other diseases classified elsewhere without behavioral disturbance: Secondary | ICD-10-CM | POA: Diagnosis not present

## 2023-03-26 DIAGNOSIS — Z7722 Contact with and (suspected) exposure to environmental tobacco smoke (acute) (chronic): Secondary | ICD-10-CM | POA: Diagnosis not present

## 2023-03-26 DIAGNOSIS — Z79899 Other long term (current) drug therapy: Secondary | ICD-10-CM | POA: Diagnosis not present

## 2023-03-26 LAB — URINALYSIS, ROUTINE W REFLEX MICROSCOPIC
Bilirubin Urine: NEGATIVE
Glucose, UA: NEGATIVE mg/dL
Hgb urine dipstick: NEGATIVE
Ketones, ur: NEGATIVE mg/dL
Leukocytes,Ua: NEGATIVE
Nitrite: NEGATIVE
Protein, ur: NEGATIVE mg/dL
Specific Gravity, Urine: <1.005 — ABNORMAL LOW (ref 1.005–1.030)
pH: 6.5 (ref 5.0–8.0)

## 2023-03-26 LAB — COMPREHENSIVE METABOLIC PANEL
ALT: <5 U/L (ref 0–44)
AST: 12 U/L — ABNORMAL LOW (ref 15–41)
Albumin: 4.6 g/dL (ref 3.5–5.0)
Alkaline Phosphatase: 54 U/L (ref 38–126)
Anion gap: 6 (ref 5–15)
BUN: 19 mg/dL (ref 8–23)
CO2: 32 mmol/L (ref 22–32)
Calcium: 9.5 mg/dL (ref 8.9–10.3)
Chloride: 100 mmol/L (ref 98–111)
Creatinine, Ser: 0.56 mg/dL (ref 0.44–1.00)
GFR, Estimated: >60 mL/min (ref >60)
Glucose, Bld: 96 mg/dL (ref 70–99)
Potassium: 3.5 mmol/L (ref 3.5–5.1)
Sodium: 138 mmol/L (ref 135–145)
Total Bilirubin: 0.6 mg/dL (ref 0.3–1.2)
Total Protein: 6.8 g/dL (ref 6.5–8.1)

## 2023-03-26 LAB — CBC
HCT: 33.9 % — ABNORMAL LOW (ref 36.0–46.0)
Hemoglobin: 10.9 g/dL — ABNORMAL LOW (ref 12.0–15.0)
MCH: 28.4 pg (ref 26.0–34.0)
MCHC: 32.2 g/dL (ref 30.0–36.0)
MCV: 88.3 fL (ref 80.0–100.0)
Platelets: 310 10*3/uL (ref 150–400)
RBC: 3.84 MIL/uL — ABNORMAL LOW (ref 3.87–5.11)
RDW: 13.4 % (ref 11.5–15.5)
WBC: 6.2 10*3/uL (ref 4.0–10.5)
nRBC: 0 % (ref 0.0–0.2)

## 2023-03-26 LAB — TROPONIN I (HIGH SENSITIVITY)
Troponin I (High Sensitivity): 4 ng/L (ref <18)
Troponin I (High Sensitivity): 3 ng/L (ref <18)

## 2023-03-26 MED ORDER — CARBIDOPA-LEVODOPA ER 48.75-195 MG PO CPCR
2.0000 | ORAL_CAPSULE | Freq: Two times a day (BID) | ORAL | Status: DC
  Administered 2023-03-27 – 2023-03-28 (×2): 2 via ORAL
  Filled 2023-03-26 (×4): qty 2

## 2023-03-26 MED ORDER — ENOXAPARIN SODIUM 40 MG/0.4ML IJ SOSY
40.0000 mg | PREFILLED_SYRINGE | INTRAMUSCULAR | Status: DC
Start: 2023-03-26 — End: 2023-03-28
  Administered 2023-03-27 (×2): 40 mg via SUBCUTANEOUS
  Filled 2023-03-26 (×2): qty 0.4

## 2023-03-26 MED ORDER — RIVASTIGMINE 4.6 MG/24HR TD PT24
4.6000 mg | MEDICATED_PATCH | Freq: Every day | TRANSDERMAL | Status: DC
  Administered 2023-03-27 – 2023-03-28 (×2): 4.6 mg via TRANSDERMAL
  Filled 2023-03-26 (×2): qty 1

## 2023-03-26 MED ORDER — CARBIDOPA-LEVODOPA ER 48.75-195 MG PO CPCR
3.0000 | ORAL_CAPSULE | Freq: Two times a day (BID) | ORAL | Status: DC
  Administered 2023-03-27 – 2023-03-28 (×3): 3 via ORAL
  Filled 2023-03-26 (×3): qty 3

## 2023-03-26 MED ORDER — SERTRALINE HCL 100 MG PO TABS
200.0000 mg | ORAL_TABLET | Freq: Every day | ORAL | Status: DC
  Administered 2023-03-27 – 2023-03-28 (×2): 200 mg via ORAL
  Filled 2023-03-26 (×2): qty 2

## 2023-03-26 MED ORDER — SODIUM CHLORIDE 0.9% FLUSH
3.0000 mL | Freq: Two times a day (BID) | INTRAVENOUS | Status: DC
  Administered 2023-03-27 – 2023-03-28 (×4): 3 mL via INTRAVENOUS

## 2023-03-26 MED ORDER — LEVOTHYROXINE SODIUM 50 MCG PO TABS
50.0000 ug | ORAL_TABLET | Freq: Every day | ORAL | Status: DC
  Administered 2023-03-27 – 2023-03-28 (×2): 50 ug via ORAL
  Filled 2023-03-26 (×2): qty 1

## 2023-03-26 MED ORDER — PIMAVANSERIN TARTRATE 34 MG PO CAPS
1.0000 | ORAL_CAPSULE | Freq: Every day | ORAL | Status: DC
  Administered 2023-03-27 – 2023-03-28 (×2): 34 mg via ORAL
  Filled 2023-03-26 (×2): qty 1

## 2023-03-26 MED ORDER — TRAZODONE HCL 50 MG PO TABS
50.0000 mg | ORAL_TABLET | Freq: Every day | ORAL | Status: DC
  Administered 2023-03-27: 50 mg via ORAL
  Filled 2023-03-26 (×2): qty 1

## 2023-03-26 MED ORDER — ACETAMINOPHEN 500 MG PO TABS
1000.0000 mg | ORAL_TABLET | Freq: Four times a day (QID) | ORAL | Status: DC | PRN
  Administered 2023-03-27: 500 mg via ORAL
  Administered 2023-03-27 – 2023-03-28 (×2): 1000 mg via ORAL
  Filled 2023-03-26 (×3): qty 2

## 2023-03-26 NOTE — ED Provider Notes (Signed)
[place] EMERGENCY DEPARTMENT AT [facility] Provider Note   CSN: [id number] Arrival date & time: 03/26/23  1545     History  Chief Complaint  Patient presents with   Weakness    Kelly Howard is a 72 y.o. female.  72 year old female present emergency department with syncopal episode this morning.  Got up in her usual state of health.  Had a transient episode of confusion which husband notes is not unusual with her Parkinson's.  Then while she was ambulating with walker she had a blank stare on her face with the lights on and no one's home description.  She then went limp and syncopized.  Husband caught her and lowered her to the ground.  She came to on the ground few seconds later.  Had some momentary fogginess, for a few minutes, but then returned back to baseline.  Some food breakfast.  Kelly Howard to primary doctor who sent her to the emergency department for further evaluation.     Weakness      Home Medications Prior to Admission medications   Medication Sig Start Date End Date Taking? Authorizing Provider  acetaminophen (TYLENOL) 325 MG tablet Take 325-650 mg by mouth every 6 (six) hours as needed for mild pain.    [provider]  AMBULATORY NON FORMULARY MEDICATION Abdominal compression binder Dx: G20 11/07/19   Tat, Octaviano Batty, DO  Carbidopa-Levodopa ER (RYTARY) 48.75-195 MG CPCR Take by mouth. 2 capsules at 6:00 am 3 capsules at 12 Noon  3 capsules at 6:00 pm 2 capsules at bedtime    [provider]  levothyroxine (SYNTHROID, LEVOTHROID) 50 MCG tablet Take 50 mcg by mouth daily before breakfast. 03/09/12   [provider]  NUPLAZID 34 MG CAPS Take 1 capsule by mouth daily. 01/30/21   [provider]  rivastigmine (EXELON) 4.6 mg/24hr 4.6 mg daily. 03/30/21   [provider]  sertraline (ZOLOFT) 100 MG tablet TAKE TWO TABLETS DAILY. 05/09/22   Mozingo, Thereasa Solo, NP  traZODone (DESYREL) 50 MG tablet Take 50 mg  by mouth at bedtime.    [provider]      Allergies    Codeine, Escitalopram, and Propoxyphene    Review of Systems   Review of Systems  Neurological:  Positive for weakness.    Physical Exam Updated Vital Signs BP (!) 145/87   Pulse 84   Temp 98.4 F (36.9 C) (Oral)   Resp 19   LMP  (LMP Unknown) Comment: gyn  SpO2 100%  Physical Exam Vitals and nursing note reviewed.  HENT:     Head: Normocephalic and atraumatic.     Nose: Nose normal.     Mouth/Throat:     Mouth: Mucous membranes are moist.  Eyes:     Conjunctiva/sclera: Conjunctivae normal.  Cardiovascular:     Rate and Rhythm: Normal rate.     Pulses: Normal pulses.  Pulmonary:     Effort: Pulmonary effort is normal.     Breath sounds: Normal breath sounds.  Abdominal:     General: Abdomen is flat. There is no distension.     Tenderness: There is no abdominal tenderness. There is no guarding or rebound.  Musculoskeletal:        General: Normal range of motion.     Comments: Significant bruising and tenderness to the left clavicle old fracture site.  Skin:    General: Skin is warm and dry.     Capillary Refill: Capillary refill takes less than 2  seconds.  Neurological:     Mental Status: She is alert and oriented to person, place, and time. Mental status is at baseline.  Psychiatric:        Mood and Affect: Mood normal.        Behavior: Behavior normal.     ED Results / Procedures / Treatments   Labs (all labs ordered are listed, but only abnormal results are displayed) Labs Reviewed  CBC - Abnormal; Notable for the following components:      Result Value   RBC 3.84 (*)    Hemoglobin 10.9 (*)    HCT 33.9 (*)    All other components within normal limits  COMPREHENSIVE METABOLIC PANEL - Abnormal; Notable for the following components:   AST 12 (*)    All other components within normal limits  URINALYSIS, ROUTINE W REFLEX MICROSCOPIC  TROPONIN I (HIGH SENSITIVITY)  TROPONIN I (HIGH  SENSITIVITY)    EKG EKG Interpretation Date/Time:  Sunday March 26 2023 16:05:05 EDT Ventricular Rate:  64 PR Interval:  125 QRS Duration:  96 QT Interval:  392 QTC Calculation: 405 R Axis:   67  Text Interpretation: Sinus rhythm Consider right atrial enlargement Confirmed by Estanislado Pandy [phone]) on 03/26/2023 5:18:08 PM  Radiology CT Head Wo Contrast  Result Date: 03/26/2023 CLINICAL DATA:  Head trauma mental status change EXAM: CT HEAD WITHOUT CONTRAST TECHNIQUE: Contiguous axial images were obtained from the base of the skull through the vertex without intravenous contrast. RADIATION DOSE REDUCTION: This exam was performed according to the departmental dose-optimization program which includes automated exposure control, adjustment of the mA and/or kV according to patient size and/or use of iterative reconstruction technique. COMPARISON:  CT brain 05/21/2022 FINDINGS: Brain: No acute territorial infarction, hemorrhage or intracranial mass. Mild atrophy. Nonenlarged ventricles Vascular: No hyperdense vessels.  No unexpected calcification Skull: Normal. Negative for fracture or focal lesion. Sinuses/Orbits: No acute finding. Other: None IMPRESSION: No CT evidence for acute intracranial abnormality Electronically Signed   By: Jasmine Pang M.D.   On: 03/26/2023 17:49   DG Chest Portable 1 View  Result Date: 03/26/2023 CLINICAL DATA:  Syncope, fall EXAM: PORTABLE CHEST 1 VIEW COMPARISON:  01/26/2023 FINDINGS: The heart size and mediastinal contours are within normal limits. Both lungs are clear. No acute osseous findings. Chronic fracture deformities of the lateral right ribs. IMPRESSION: No acute abnormality of the lungs in AP portable projection. Electronically Signed   By: Jearld Lesch M.D.   On: 03/26/2023 17:48    Procedures Procedures    Medications Ordered in ED Medications - No data to display  ED Course/ Medical Decision Making/ A&P                                  Medical Decision Making 72 year old female present emergency department after syncopal episode.  Afebrile vital signs reassuring.  Orthostatic negative.  Per chart review was seen by primary doctor who reported that she was orthostatic positive and had abnormal EKG.  Appears to be normal sinus rhythm in our EKG.  Troponin negative x 2.  ACS unlikely.  No significant metabolic derangements on her CMP.  CBC without leukocytosis to suggest systemic infection.  Per husband which reports she had a fall on the 22nd which she suffered a clavicle fracture.  He did not witness this episode, but was wondering if it was similar to episode today.  Possible syncope.  Per chart  review patient has not had any provocative cardiac testing.  No history.  Shared decision-making with patient regarding admission for further testing versus outpatient.  Husband and patient would feel more comfortable admitting patient to the hospital.  Feel this is reasonable given patient's age and risk factors as well as second episode in 1 week's time.  Ordered CT head as it does not appear that she had imaging done last week when she fell.  CT head negative.  Amount and/or Complexity of Data Reviewed Labs: ordered. Radiology: ordered.  Risk Decision regarding hospitalization.          Final Clinical Impression(s) / ED Diagnoses Final diagnoses:  Syncope, unspecified syncope type    Rx / DC Orders ED Discharge Orders     None         Coral Spikes, DO 03/26/23 2001

## 2023-03-26 NOTE — ED Triage Notes (Signed)
Her husband is with her. He tells me that, at about 0700 this morning, while ambulating with her walker (she has Parkinson's), she suddenly became "incoherent and had to be helped to the ground". He states it took pt. "About an hour to get back to normal". She was seen today by her pcp at [facility], who found her to have an "abnormal EKG" and they recommended she come here. She has had numerous falls recently and due to same has a ~ 64 week old left clavicle fracture. Pt. Denies any pain at this time.

## 2023-03-26 NOTE — Progress Notes (Addendum)
Patient arrived to room via ambulance. Awake and alert and answers direct questions. Confusion noted upon admission. This is stated from chart that is patient's baseline. Skin washed and gown applied. Set up on telemetry. Large bruising noted to left shoulder/clavicle area. Redness noted where telemetry leads were placed. (Possible skin irritation from leads) Bruising noted to right knee. Blanchable redness  noted to buttocks. Exelon patch noted to right side of back. Incontinent episode occurred while in bed. Bed linen changed and perwick placed. Patient states "I gotta get out of here." Reassured patient that she was in a safe place and the reason she is in the hospital. Assessed patient. Appears very stiffened. Barely moving. C/o pain and tenderness to right knee and left shoulder. Resp even and unlabored. Vitals taken. Patient states that she wants to get up but reassured that would not be safe at this time. Patient doesn't appear strong enough to get up. Could barely help with turning from side to side. Squeezes both hands and follows command. Lifts legs off bed but drops back down. Repositioned and will turn q 2 hours.  2246- adjusted bed to attempt orthostatics. Patient refuses to stand up at this time and I question if she is able to stand, weakness noted. Orthostatics obtained by adjustment of bed from lying, sitting, and somewhat of a standing position to obtain these results safely.   2330- Unable to complete admission questions due to patient's confusion. Husband will be here in the morning to verify information and meds

## 2023-03-26 NOTE — ED Notes (Signed)
ED TO INPATIENT HANDOFF REPORT  ED Nurse Name and Phone #:   S Name/Age/Gender Kelly Howard 72 y.o. female Room/Bed: DB006/DB006  Code Status   Code Status: Prior  Home/SNF/Other Home Patient oriented to: self, place, time, and situation Is this baseline?  Can answer orientation questions, however, pt is confused at baseline  Triage Complete: Triage complete  Chief Complaint Syncope [R55]  Triage Note Her husband is with her. He tells me that, at about 0700 this morning, while ambulating with her walker (she has Parkinson's), she suddenly became "incoherent and had to be helped to the ground". He states it took pt. "About an hour to get back to normal". She was seen today by her pcp at [facility], who found her to have an "abnormal EKG" and they recommended she come here. She has had numerous falls recently and due to same has a ~ 45 week old left clavicle fracture. Pt. Denies any pain at this time.   Allergies Allergies  Allergen Reactions   Codeine Other (See Comments)    GI Upset   Escitalopram Swelling and Other (See Comments)    Feet swell   Propoxyphene Other (See Comments)    Hallucinations    Level of Care/Admitting Diagnosis ED Disposition     ED Disposition  Admit   Condition  --   Comment  Hospital Area: [facility] [[id number]]  Level of Care: Telemetry [5]  Admit to tele based on following criteria: Eval of Syncope  Interfacility transfer: Yes  May place patient in observation at [facility] or [facility] if equivalent level of care is available:: Yes  Covid Evaluation: Asymptomatic - no recent exposure (last 10 days) testing not required  Diagnosis: Syncope [206001]  Admitting Physician: Arlean Hopping [[id number]]  Attending Physician: Arlean Hopping [[id number]]          B Medical/Surgery History Past Medical History:  Diagnosis Date   Frequent falls    Hypothyroidism    Osteoporosis    Parkinson's disease (HCC)     Past Surgical History:  Procedure Laterality Date   TONSILLECTOMY     WRIST SURGERY       A IV Location/Drains/Wounds Patient Lines/Drains/Airways Status     Active Line/Drains/Airways     Name Placement date Placement time Site Days   Peripheral IV 03/26/23 18 G Right;Upper Arm 03/26/23  1619  Arm  less than 1            Intake/Output Last 24 hours No intake or output data in the 24 hours ending 03/26/23 2001  Labs/Imaging Results for orders placed or performed during the hospital encounter of 03/26/23 (from the past 48 hour(s))  CBC     Status: Abnormal   Collection Time: 03/26/23  4:20 PM  Result Value Ref Range   WBC 6.2 4.0 - 10.5 K/uL   RBC 3.84 (L) 3.87 - 5.11 MIL/uL   Hemoglobin 10.9 (L) 12.0 - 15.0 g/dL   HCT 69.6 (L) 29.5 - 28.4 %   MCV 88.3 80.0 - 100.0 fL   MCH 28.4 26.0 - 34.0 pg   MCHC 32.2 30.0 - 36.0 g/dL   RDW 13.2 44.0 - 10.2 %   Platelets 310 150 - 400 K/uL   nRBC 0.0 0.0 - 0.2 %    Comment: Performed at [facility], [address] [place], [place] [address]  Comprehensive metabolic panel     Status: Abnormal   Collection Time: 03/26/23  4:20 PM  Result  Value Ref Range   Sodium 138 135 - 145 mmol/L   Potassium 3.5 3.5 - 5.1 mmol/L   Chloride 100 98 - 111 mmol/L   CO2 32 22 - 32 mmol/L   Glucose, Bld 96 70 - 99 mg/dL    Comment: Glucose reference range applies only to samples taken after fasting for at least 8 hours.   BUN 19 8 - 23 mg/dL   Creatinine, Ser 6.04 0.44 - 1.00 mg/dL   Calcium 9.5 8.9 - 54.0 mg/dL   Total Protein 6.8 6.5 - 8.1 g/dL   Albumin 4.6 3.5 - 5.0 g/dL   AST 12 (L) 15 - 41 U/L   ALT <5 0 - 44 U/L   Alkaline Phosphatase 54 38 - 126 U/L   Total Bilirubin 0.6 0.3 - 1.2 mg/dL   GFR, Estimated >98 >11 mL/min    Comment: (NOTE) Calculated using the CKD-EPI Creatinine Equation (2021)    Anion gap 6 5 - 15    Comment: Performed at [facility], [address] [place],  [place] [address]  Troponin I (High Sensitivity)     Status: None   Collection Time: 03/26/23  4:20 PM  Result Value Ref Range   Troponin I (High Sensitivity) 4 <18 ng/L    Comment: (NOTE) Elevated high sensitivity troponin I (hsTnI) values and significant  changes across serial measurements may suggest ACS but many other  chronic and acute conditions are known to elevate hsTnI results.  Refer to the "Links" section for chest pain algorithms and additional  guidance. Performed at [facility], [address], [place], [place] [address]   Troponin I (High Sensitivity)     Status: None   Collection Time: 03/26/23  6:43 PM  Result Value Ref Range   Troponin I (High Sensitivity) 3 <18 ng/L    Comment: (NOTE) Elevated high sensitivity troponin I (hsTnI) values and significant  changes across serial measurements may suggest ACS but many other  chronic and acute conditions are known to elevate hsTnI results.  Refer to the "Links" section for chest pain algorithms and additional  guidance. Performed at [facility], [address] [place], [place] [address]    CT Head Wo Contrast  Result Date: 03/26/2023 CLINICAL DATA:  Head trauma mental status change EXAM: CT HEAD WITHOUT CONTRAST TECHNIQUE: Contiguous axial images were obtained from the base of the skull through the vertex without intravenous contrast. RADIATION DOSE REDUCTION: This exam was performed according to the departmental dose-optimization program which includes automated exposure control, adjustment of the mA and/or kV according to patient size and/or use of iterative reconstruction technique. COMPARISON:  CT brain 05/21/2022 FINDINGS: Brain: No acute territorial infarction, hemorrhage or intracranial mass. Mild atrophy. Nonenlarged ventricles Vascular: No hyperdense vessels.  No unexpected calcification Skull: Normal. Negative for fracture or focal lesion. Sinuses/Orbits: No acute finding. Other:  None IMPRESSION: No CT evidence for acute intracranial abnormality Electronically Signed   By: Jasmine Pang M.D.   On: 03/26/2023 17:49   DG Chest Portable 1 View  Result Date: 03/26/2023 CLINICAL DATA:  Syncope, fall EXAM: PORTABLE CHEST 1 VIEW COMPARISON:  01/26/2023 FINDINGS: The heart size and mediastinal contours are within normal limits. Both lungs are clear. No acute osseous findings. Chronic fracture deformities of the lateral right ribs. IMPRESSION: No acute abnormality of the lungs in AP portable projection. Electronically Signed   By: Jearld Lesch M.D.   On: 03/26/2023 17:48    Pending Labs [employer] (From  admission, onward)     Start     Ordered   03/26/23 1939  Urinalysis, Routine w reflex microscopic -Urine, Clean Catch  Once,   URGENT       Question:  Specimen Source  Answer:  Urine, Clean Catch   03/26/23 1938            Vitals/Pain Today's Vitals   03/26/23 1657 03/26/23 1659 03/26/23 1700 03/26/23 1845  BP: (!) 140/86 (!) 145/87    Pulse: 70 80 84   Resp: 13 (!) 24 18 19   Temp:      TempSrc:      SpO2:  100%    PainSc:        Isolation Precautions No active isolations  Medications Medications - No data to display  Mobility manual wheelchair     Focused Assessments    R Recommendations: See Admitting Provider Note  Report given to:   Additional Notes:

## 2023-03-27 ENCOUNTER — Other Ambulatory Visit: Payer: Self-pay

## 2023-03-27 DIAGNOSIS — G20B2 Parkinson's disease with dyskinesia, with fluctuations: Secondary | ICD-10-CM

## 2023-03-27 DIAGNOSIS — R55 Syncope and collapse: Secondary | ICD-10-CM | POA: Diagnosis not present

## 2023-03-27 DIAGNOSIS — R296 Repeated falls: Secondary | ICD-10-CM

## 2023-03-27 NOTE — Evaluation (Signed)
Physical Therapy Evaluation Patient Details Name: Kelly Howard MRN: [id number] DOB: 06-23-1950 Today's Date: 03/27/2023  History of Present Illness  Kelly Howard is a 72 y.o. female admitted on 03/26/23 with syncopal episode.  Pt with hx of Parkinson's disease, Lewy body dementia with mood disturbance, frequent falls, recent history of a left clavicular fracture  Clinical Impression  Pt admitted with above diagnosis. At baseline, pt lives at home with spouse and is typically ambulatory without AD.  She has had several recent falls, some associated with syncope. She does have writhing/dyskinsia movements baseline from Parkinsons.  Held PT eval earlier today for pt to receive Parkinson's meds, she has now received her medication.  She was able to transfer and ambulate short distance in room with min A.  Pt did have some instability requiring assist for balance.  Pt did have syncopal symptoms with standing that improved with sitting and AROM, she did have a drop in BP (see below).  Will benefit from further PT and HHPT at d/c.  Did provide spouse with gait belt for home and request TED hose/abdominal binder from MD if appropriate. Pt does have 24 hr care at home if needed. Pt currently with functional limitations due to the deficits listed below (see PT Problem List). Pt will benefit from acute skilled PT to increase their independence and safety with mobility to allow discharge.      BP as follows: Supine 107/69 HR 75 Sitting 94/60 HR 88 Sitting 3 mins 111/89 HR 90 Standing 76/54 HR 111 (pt feeling foggy, tunnel vision) Not able to initially tolerate static stand 3 min Sitting BSC 92/65 HR 91 After BSC , walked around bed to chair BP 100/70 with HR 72  Notified RN and MD of BP, request TED hose and abdominal binder.       If plan is discharge home, recommend the following: A little help with walking and/or transfers;A little help with bathing/dressing/bathroom;Assistance with  cooking/housework;Help with stairs or ramp for entrance   Can travel by private vehicle        Equipment Recommendations None recommended by PT  Recommendations for Other Services       Functional Status Assessment Patient has had a recent decline in their functional status and demonstrates the ability to make significant improvements in function in a reasonable and predictable amount of time.     Precautions / Restrictions Precautions Precautions: Shoulder Type of Shoulder Precautions: L clavicle fx, sling Shoulder Interventions: Shoulder sling/immobilizer;For comfort Precaution Comments: Prior clavicular fracture noted, plan for outpatient follow-up with orthopedic surgery as scheduled, no indication for evaluation, imaging or intervention at this time -continue left upper extremity sling Restrictions Weight Bearing Restrictions: Yes LLE Weight Bearing: Weight bearing as tolerated Other Position/Activity Restrictions: Per MD note: Prior clavicular fracture noted, plan for outpatient follow-up with orthopedic surgery as scheduled, no indication for evaluation, imaging or intervention at this time -continue left upper extremity sling      Mobility  Bed Mobility   Bed Mobility: Supine to Sit, Sit to Supine     Supine to sit: Min assist Sit to supine: Min assist   General bed mobility comments: Light min A and increased time    Transfers Overall transfer level: Needs assistance Equipment used: Rolling walker (2 wheels) Transfers: Sit to/from Stand, Bed to chair/wheelchair/BSC Sit to Stand: Min assist   Step pivot transfers: Min assist       General transfer comment: Performed STS x 5 light min A to rise;  Step pivot to and from [facility] with light min A for balance and RW    Ambulation/Gait Ambulation/Gait assistance: Min assist, +2 safety/equipment Gait Distance (Feet): 15 Feet Assistive device: 2 person hand held assist Gait Pattern/deviations: Step-to pattern,  Decreased stride length Gait velocity: decreased     General Gait Details: Pt typically uses no AD or rollator.  She was having difficulty managing RW.  Ambulated around bed to recliner with light min A  of 2 with HHA  Stairs            Wheelchair Mobility     Tilt Bed    Modified Rankin (Stroke Patients Only)       Balance Overall balance assessment: Needs assistance, History of Falls Sitting-balance support: No upper extremity supported Sitting balance-Leahy Scale: Fair     Standing balance support: Bilateral upper extremity supported Standing balance-Leahy Scale: Poor Standing balance comment: min A and UE support                             Pertinent Vitals/Pain      Home Living Family/patient expects to be discharged to:: Private residence   Available Help at Discharge: Personal care attendant;Available 24 hours/day;Family Type of Home: House Home Access: Level entry       Home Layout: Two level;Able to live on main level with bedroom/bathroom Home Equipment: Rolling Walker (2 wheels);Cane - single point;Shower seat - built in;Grab bars - toilet;Grab bars - tub/shower Additional Comments: Husband states home is very accessible    Prior Function Prior Level of Function : Needs assist;History of Falls (last six months)             Mobility Comments: Pt uses a rollator at times (spouse reports needs to use more); was going to the gym and walking/stationary cycling frequently, 6 falls in 6 months ADLs Comments: Per spouse, pt requires assist with most ADLs. Does not perform IADLs     Extremity/Trunk Assessment   Upper Extremity Assessment Upper Extremity Assessment: Defer to OT evaluation LUE Deficits / Details: L clavicle fx (kept immobilized)    Lower Extremity Assessment Lower Extremity Assessment: LLE deficits/detail;RLE deficits/detail RLE Deficits / Details: ROM WFL; MMT grossly 5/5 LLE Deficits / Details: ROM WFL; MMT grossly  5/5       Communication      Cognition Arousal: Alert Behavior During Therapy: WFL for tasks assessed/performed Overall Cognitive Status: Impaired/Different from baseline                                 General Comments: Delayed responses, A&Ox3, spouse assists with PLOF; follows basic commands; requiring repetition, random statements at  times, spouse reports more confused than normal        General Comments General comments (skin integrity, edema, etc.): Pt with ecchymosis around L clavicle.  She has dyskinesia/writhing movement throughout extremities and trunk    Exercises     Assessment/Plan    PT Assessment Patient needs continued PT services  PT Problem List Decreased strength;Cardiopulmonary status limiting activity;Decreased range of motion;Decreased activity tolerance;Decreased balance;Decreased mobility;Decreased safety awareness;Decreased knowledge of use of DME       PT Treatment Interventions DME instruction;Therapeutic exercise;Gait training;Stair training;Functional mobility training;Therapeutic activities;Patient/family education;Neuromuscular re-education;Modalities;Balance training    PT Goals (Current goals can be found in the Care Plan section)  Acute Rehab PT Goals Patient Stated Goal: return home PT  Goal Formulation: With patient/family Time For Goal Achievement: 04/10/23 Potential to Achieve Goals: Good    Frequency Min 1X/week     Co-evaluation               AM-PAC PT "6 Clicks" Mobility  Outcome Measure Help needed turning from your back to your side while in a flat bed without using bedrails?: A Little Help needed moving from lying on your back to sitting on the side of a flat bed without using bedrails?: A Little Help needed moving to and from a bed to a chair (including a wheelchair)?: A Little Help needed standing up from a chair using your arms (e.g., wheelchair or bedside chair)?: A Little Help needed to walk in  hospital room?: A Little Help needed climbing 3-5 steps with a railing? : A Lot 6 Click Score: 17    End of Session Equipment Utilized During Treatment: Gait belt Activity Tolerance: Patient tolerated treatment well Patient left: with chair alarm set;in chair;with call bell/phone within reach Nurse Communication: Mobility status;Other (comment) (BP) PT Visit Diagnosis: Other abnormalities of gait and mobility (R26.89);Muscle weakness (generalized) (M62.81);History of falling (Z91.81)    Time: 1430-1505 PT Time Calculation (min) (ACUTE ONLY): 35 min   Charges:   PT Evaluation $PT Eval Moderate Complexity: 1 Mod PT Treatments $Therapeutic Activity: 8-22 mins PT General Charges $$ ACUTE PT VISIT: 1 Visit         Anise Salvo, PT Acute Rehab [facility] [phone]   Rayetta Humphrey 03/27/2023, 3:29 PM

## 2023-03-27 NOTE — Progress Notes (Signed)
PROGRESS NOTE    Kelly Howard  WUJ:[id number] DOB: 27-Jun-1950 DOA: 03/26/2023 PCP: Philip Aspen, Limmie Patricia, MD   Brief Narrative:  Kelly Howard is a 72 y.o. female with hx of Parkinson's disease, Lewy body dementia with mood disturbance, frequent falls, recent history of a left clavicular fracture(10/22), who was transferred from [facility] ED due to reported syncopal episode at home.  Per husband no traumatic fall, he was able to catch her  just prior to the fall.  He notes she was altered (foggy) more so than usual and presented to the ED given her syncopal episode.  Hospitalist called for admission.   Assessment & Plan:   Principal Problem:   Syncope  Syncopal episode Recent fall and left clavicular fracture(POA) -Likely orthostatic in nature given improvement with fluids and increase p.o. intake -Noted orthostatic hypotension history, not uncommon for Parkinson's patients -also has history of questionable vagal episodes -PT/OT/orthostatics still pending -Disposition remains unclear, patient's husband hopeful to have patient return home with outpatient versus therapy at home, unclear if she will do well at rehab facility given her mental status and reported anxiety -Prior clavicular fracture noted, plan for outpatient follow-up with orthopedic surgery as scheduled, no indication for evaluation, imaging or intervention at this time -continue left upper extremity sling   Chronic medical problems: Parkinson's with Lewy body dementia with behavioral disturbance: Continue home carbidopa-levodopa, pimavanserin, rivastigmine Hypothyroidism: Continue home levothyroxine Mood disorder: See above and Parkinson's, also continue sertraline, trazodone.  DVT prophylaxis: enoxaparin (LOVENOX) injection 40 mg Start: 03/26/23 2230   Code Status:   Code Status: Full Code  Family Communication: Husband  Status is: Inpatient  Dispo: The patient is from: Home               Anticipated d/c is to: Home              Anticipated d/c date is: 24 to 48 hours              Patient currently not medically stable for discharge  Consultants:  None  Procedures:  None  Antimicrobials:  None indicated  Subjective: No acute issues or events overnight, review of systems somewhat limited given patient's mental status but denies overt nausea vomiting diarrhea constipation headache fevers chills or chest pain  Objective: Vitals:   03/26/23 2212 03/26/23 2213 03/27/23 0225 03/27/23 0609  BP: (!) 165/66  (!) 147/79 (!) 140/84  Pulse: 67  71 67  Resp: 18 19 18 18   Temp: 98.6 F (37 C)  98.1 F (36.7 C) 98.1 F (36.7 C)  TempSrc: Oral  Oral Oral  SpO2: 98%  98% 97%  Weight:  50.2 kg    Height:  5\' 6"  (1.676 m)     No intake or output data in the 24 hours ending 03/27/23 0740 Filed Weights   03/26/23 2213  Weight: 50.2 kg    Examination:  General:  Pleasantly resting in bed, No acute distress. HEENT:  Normocephalic atraumatic.  Sclerae nonicteric, noninjected.  Extraocular movements intact bilaterally. Neck:  Without mass or deformity.  Trachea is midline. Lungs:  Clear to auscultate bilaterally without rhonchi, wheeze, or rales. Heart:  Regular rate and rhythm.  Without murmurs, rubs, or gallops. Abdomen:  Soft, nontender, nondistended.  Without guarding or rebound. Extremities: Left upper extremity internally rotated held to the chest, notable ecchymosis over left anterior chest wall and shoulder  Data Reviewed: I have personally reviewed following labs and imaging studies  CBC: Recent Labs  Lab 03/26/23 1620  WBC 6.2  HGB 10.9*  HCT 33.9*  MCV 88.3  PLT 310   Basic Metabolic Panel: Recent Labs  Lab 03/26/23 1620  NA 138  K 3.5  CL 100  CO2 32  GLUCOSE 96  BUN 19  CREATININE 0.56  CALCIUM 9.5   GFR: Estimated Creatinine Clearance: 49.6 mL/min (by C-G formula based on SCr of 0.56 mg/dL). Liver Function Tests: Recent Labs  Lab  03/26/23 1620  AST 12*  ALT <5  ALKPHOS 54  BILITOT 0.6  PROT 6.8  ALBUMIN 4.6   No results found for this or any previous visit (from the past 240 hour(s)).   Radiology Studies: CT Head Wo Contrast  Result Date: 03/26/2023 CLINICAL DATA:  Head trauma mental status change EXAM: CT HEAD WITHOUT CONTRAST TECHNIQUE: Contiguous axial images were obtained from the base of the skull through the vertex without intravenous contrast. RADIATION DOSE REDUCTION: This exam was performed according to the departmental dose-optimization program which includes automated exposure control, adjustment of the mA and/or kV according to patient size and/or use of iterative reconstruction technique. COMPARISON:  CT brain 05/21/2022 FINDINGS: Brain: No acute territorial infarction, hemorrhage or intracranial mass. Mild atrophy. Nonenlarged ventricles Vascular: No hyperdense vessels.  No unexpected calcification Skull: Normal. Negative for fracture or focal lesion. Sinuses/Orbits: No acute finding. Other: None IMPRESSION: No CT evidence for acute intracranial abnormality Electronically Signed   By: Jasmine Pang M.D.   On: 03/26/2023 17:49   DG Chest Portable 1 View  Result Date: 03/26/2023 CLINICAL DATA:  Syncope, fall EXAM: PORTABLE CHEST 1 VIEW COMPARISON:  01/26/2023 FINDINGS: The heart size and mediastinal contours are within normal limits. Both lungs are clear. No acute osseous findings. Chronic fracture deformities of the lateral right ribs. IMPRESSION: No acute abnormality of the lungs in AP portable projection. Electronically Signed   By: Jearld Lesch M.D.   On: 03/26/2023 17:48    Scheduled Meds:  Carbidopa-Levodopa ER  2 capsule Oral BID   Carbidopa-Levodopa ER  3 capsule Oral BID   enoxaparin (LOVENOX) injection  40 mg Subcutaneous Q24H   levothyroxine  50 mcg Oral QAC breakfast   Pimavanserin Tartrate  1 capsule Oral Daily   rivastigmine  4.6 mg Transdermal Daily   sertraline  200 mg Oral Daily    sodium chloride flush  3 mL Intravenous Q12H   traZODone  50 mg Oral QHS   Continuous Infusions:   LOS: 0 days   Time spent:  Azucena Fallen, DO [facility]  If 7PM-7AM, please contact night-coverage [email or web address]  03/27/2023, 7:40 AM

## 2023-03-27 NOTE — H&P (Signed)
History and Physical    Kelly Howard JXB:[id number] DOB: July 24, 1950 DOA: 03/26/2023  PCP: Philip Aspen, Limmie Patricia, MD   Patient coming from:  Tx from [facility] ED   Chief Complaint:  Chief Complaint  Patient presents with   Weakness    HPI:  Kelly Howard is a 72 y.o. female with hx of Parkinson's disease, Lewy body dementia with mood disturbance, frequent falls, recent history of a left clavicular fracture, who was transferred from [facility] ED due to syncopal episode.  History is obtained via ED provider notes, patient unable to provide any history.  Per outside ED patient was in normal state of health.  Stood up this morning and had a syncopal episode falling into her husband's arms.  No injuries, lowered to ground.  Fogginess for few minutes then back to baseline.  Saw PCP who referred to the ED.   Review of Systems:  ROS complete and negative except as marked above   Allergies  Allergen Reactions   Codeine Other (See Comments)    GI Upset   Escitalopram Swelling and Other (See Comments)    Feet swell   Propoxyphene Other (See Comments)    Hallucinations    Prior to Admission medications   Medication Sig Start Date End Date Taking? Authorizing Provider  acetaminophen (TYLENOL) 325 MG tablet Take 325-650 mg by mouth every 6 (six) hours as needed for mild pain.   Yes [provider]  Carbidopa-Levodopa ER (RYTARY) 48.75-195 MG CPCR Take 25-100 mg by mouth 5 (five) times daily. 2 capsules at 6:00 am 3 capsules at 12 Noon  3 capsules at 6:00 pm 2 capsules at bedtime   Yes [provider]  levothyroxine (SYNTHROID, LEVOTHROID) 50 MCG tablet Take 50 mcg by mouth daily before breakfast. 03/09/12  Yes [provider]  loratadine (CLARITIN) 10 MG tablet Take 10 mg by mouth daily.   Yes [provider]  NUPLAZID 34 MG CAPS Take 1 capsule by mouth daily. 01/30/21  Yes [provider]  rivastigmine (EXELON) 4.6 mg/24hr 4.6 mg  daily. 03/30/21  Yes [provider]  sertraline (ZOLOFT) 100 MG tablet TAKE TWO TABLETS DAILY. 05/09/22  Yes Mozingo, Thereasa Solo, NP  traZODone (DESYREL) 50 MG tablet Take 50 mg by mouth at bedtime.   Yes [provider]  Vibegron 75 MG TABS Take 75 mg by mouth 1 day or 1 dose.   Yes [provider]  AMBULATORY NON FORMULARY MEDICATION Abdominal compression binder Dx: G20 11/07/19   Tat, Octaviano Batty, DO    Past Medical History:  Diagnosis Date   Frequent falls    Hypothyroidism    Osteoporosis    Parkinson's disease (HCC)     Past Surgical History:  Procedure Laterality Date   TONSILLECTOMY     WRIST SURGERY       reports that she has never smoked. She has been exposed to tobacco smoke. She has never used smokeless tobacco. She reports current alcohol use. She reports that she does not use drugs.  Family History  Problem Relation Age of Onset   Hypercalcemia Mother    Cancer Paternal Grandmother    Diabetes Paternal Grandfather      Physical Exam: Vitals:   03/26/23 2212 03/26/23 2213 03/27/23 0225 03/27/23 0609  BP: (!) 165/66  (!) 147/79 (!) 140/84  Pulse: 67  71 67  Resp: 18 19 18 18   Temp: 98.6 F (37 C)  98.1 F (36.7 C) 98.1 F (36.7 C)  TempSrc: Oral  Oral Oral  SpO2: 98%  98% 97%  Weight:  50.2 kg    Height:  5\' 6"  (1.676 m)      Gen: Awake, alert, chronically ill-appearing CV: Regular, normal S1, S2, no murmurs  Resp: Normal WOB, CTAB  Abd: Flat, normoactive, nontender MSK: There is bruising over the left upper chest/clavicle and shoulder, no lower extremity edema.   Skin: See MSK for more findings.  No other rashes lesions Neuro: Alert and interactive, unable to answer any orientation questions.  Think she is at home in her bed.  No focal deficits, strength is 4-5 and symmetric, sensation appears intact to fine touch Psych: euthymic, appropriate    Data review:   Labs reviewed, notable for:   Chemistries are  unremarkable, Trop negative Hemoglobin tomorrow 9 UA not consistent with infection   Micro:  Results for orders placed or performed during the hospital encounter of 05/21/22  Resp panel by RT-PCR (RSV, Flu A&B, Covid) Anterior Nasal Swab     Status: None   Collection Time: 05/21/22  6:15 AM   Specimen: Anterior Nasal Swab  Result Value Ref Range Status   SARS Coronavirus 2 by RT PCR NEGATIVE NEGATIVE Final    Comment: (NOTE) SARS-CoV-2 target nucleic acids are NOT DETECTED.  The SARS-CoV-2 RNA is generally detectable in upper respiratory specimens during the acute phase of infection. The lowest concentration of SARS-CoV-2 viral copies this assay can detect is 138 copies/mL. A negative result does not preclude SARS-Cov-2 infection and should not be used as the sole basis for treatment or other patient management decisions. A negative result may occur with  improper specimen collection/handling, submission of specimen other than nasopharyngeal swab, presence of viral mutation(s) within the areas targeted by this assay, and inadequate number of viral copies(<138 copies/mL). A negative result must be combined with clinical observations, patient history, and epidemiological information. The expected result is Negative.  Fact Sheet for Patients:  BloggerCourse.com  Fact Sheet for Healthcare Providers:  SeriousBroker.it  This test is no t yet approved or cleared by the [place] FDA and  has been authorized for detection and/or diagnosis of SARS-CoV-2 by FDA under an Emergency Use Authorization (EUA). This EUA will remain  in effect (meaning this test can be used) for the duration of the COVID-19 declaration under Section 564(b)(1) of the Act, 21 U.S.C.section 360bbb-3(b)(1), unless the authorization is terminated  or revoked sooner.       Influenza A by PCR NEGATIVE NEGATIVE Final   Influenza B by PCR NEGATIVE NEGATIVE Final     Comment: (NOTE) The Xpert Xpress SARS-CoV-2/FLU/RSV plus assay is intended as an aid in the diagnosis of influenza from Nasopharyngeal swab specimens and should not be used as a sole basis for treatment. Nasal washings and aspirates are unacceptable for Xpert Xpress SARS-CoV-2/FLU/RSV testing.  Fact Sheet for Patients: BloggerCourse.com  Fact Sheet for Healthcare Providers: SeriousBroker.it  This test is not yet approved or cleared by the [place] FDA and has been authorized for detection and/or diagnosis of SARS-CoV-2 by FDA under an Emergency Use Authorization (EUA). This EUA will remain in effect (meaning this test can be used) for the duration of the COVID-19 declaration under Section 564(b)(1) of the Act, 21 U.S.C. section 360bbb-3(b)(1), unless the authorization is terminated or revoked.     Resp Syncytial Virus by PCR NEGATIVE NEGATIVE Final    Comment: (NOTE) Fact Sheet for Patients: BloggerCourse.com  Fact Sheet for Healthcare Providers: SeriousBroker.it  This test  is not yet approved or cleared by the [place] and has been authorized for detection and/or diagnosis of SARS-CoV-2 by FDA under an Emergency Use Authorization (EUA). This EUA will remain in effect (meaning this test can be used) for the duration of the COVID-19 declaration under Section 564(b)(1) of the Act, 21 U.S.C. section 360bbb-3(b)(1), unless the authorization is terminated or revoked.  Performed at [facility], [address] [place], [place] [address]     Imaging reviewed:  CT Head Wo Contrast  Result Date: 03/26/2023 CLINICAL DATA:  Head trauma mental status change EXAM: CT HEAD WITHOUT CONTRAST TECHNIQUE: Contiguous axial images were obtained from the base of the skull through the vertex without intravenous contrast. RADIATION DOSE REDUCTION: This exam was performed  according to the departmental dose-optimization program which includes automated exposure control, adjustment of the mA and/or kV according to patient size and/or use of iterative reconstruction technique. COMPARISON:  CT brain 05/21/2022 FINDINGS: Brain: No acute territorial infarction, hemorrhage or intracranial mass. Mild atrophy. Nonenlarged ventricles Vascular: No hyperdense vessels.  No unexpected calcification Skull: Normal. Negative for fracture or focal lesion. Sinuses/Orbits: No acute finding. Other: None IMPRESSION: No CT evidence for acute intracranial abnormality Electronically Signed   By: Jasmine Pang M.D.   On: 03/26/2023 17:49   DG Chest Portable 1 View  Result Date: 03/26/2023 CLINICAL DATA:  Syncope, fall EXAM: PORTABLE CHEST 1 VIEW COMPARISON:  01/26/2023 FINDINGS: The heart size and mediastinal contours are within normal limits. Both lungs are clear. No acute osseous findings. Chronic fracture deformities of the lateral right ribs. IMPRESSION: No acute abnormality of the lungs in AP portable projection. Electronically Signed   By: Jearld Lesch M.D.   On: 03/26/2023 17:48    EKG:  Sinus rhythm without acute ischemic changes  ED Course:  [facility], husband felt more comfortable with plan for observation.   Assessment/Plan:  72 y.o. female with hx Parkinson's disease, Lewy body dementia with mood disturbance, frequent falls, recent history of a left clavicular fracture, who was transferred from [facility] ED due to syncopal episode.    Syncopal episode Recent fall and clavicular fracture Suspect that she may have underlying orthostatic hypotension associated with autonomic changes from Parkinson's disease.  Otherwise she has a recent fracture, possibility of vagal episode.  She is borderline bradycardic at times and is on anticholinesterase inhibitors, so cannot rule out bradyarrhythmia. EKG unremarkable.  -Check orthostatics -Telemetry monitoring,  consider cardiac monitor at discharge due to recurrent falls, syncope -Do not feel she needs TTE at this time - Maintain LUE in sling  - PT / OT evaluation   Chronic medical problems: Parkinson's with Lewy body dementia with behavioral disturbance: Continue home carbidopa-levodopa, pimavanserin, rivastigmine Hypothyroidism: Continue home levothyroxine Mood disorder: See above and Parkinson's, also continue sertraline, trazodone.   Body mass index is 17.87 kg/m.    DVT prophylaxis:  Lovenox Code Status:  Full Code; Presumed full no ACP docs on file. Consistent with prior code status on prior encounters. Please confirm with husband   Diet:  Diet Orders (From admission, onward)     Start     Ordered   03/26/23 2219  Diet regular Room service appropriate? Yes; Fluid consistency: Thin  Diet effective now       Question Answer Comment  Room service appropriate? Yes   Fluid consistency: Thin      03/26/23 2219           Family Communication:  No   Consults:  none   Admission status:   Observation, Telemetry bed  Severity of Illness: The appropriate patient status for this patient is OBSERVATION. Observation status is judged to be reasonable and necessary in order to provide the required intensity of service to ensure the patient's safety. The patient's presenting symptoms, physical exam findings, and initial radiographic and laboratory data in the context of their medical condition is felt to place them at decreased risk for further clinical deterioration. Furthermore, it is anticipated that the patient will be medically stable for discharge from the hospital within 2 midnights of admission.    Dolly Rias, MD [facility]  How to contact the [facility] Attending or Consulting provider 7A - 7P or covering provider during after hours 7P -7A, for this patient.  Check the care team in [facility] and look for a) attending/consulting TR[facility] provider listed and b) the [facility] team listed Log  into [email or web address] and use [place]'s universal password to access. If you do not have the password, please contact the hospital operator. Locate the [facility] provider you are looking for under [facility] and page to a number that you can be directly reached. If you still have difficulty reaching the provider, please page the [facility] (Director on Call) for the Hospitalists listed on amion for assistance.  03/27/2023, 6:35 AM

## 2023-03-27 NOTE — Plan of Care (Signed)
  Problem: Education: Goal: Knowledge of General Education information will improve Description: Including pain rating scale, medication(s)/side effects and non-pharmacologic comfort measures Outcome: Progressing   Problem: Health Behavior/Discharge Planning: Goal: Ability to manage health-related needs will improve Outcome: Progressing   Problem: Clinical Measurements: Goal: Ability to maintain clinical measurements within normal limits will improve Outcome: Progressing Goal: Diagnostic test results will improve Outcome: Progressing Goal: Cardiovascular complication will be avoided Outcome: Progressing   Problem: Activity: Goal: Risk for activity intolerance will decrease Outcome: Progressing   Problem: Nutrition: Goal: Adequate nutrition will be maintained Outcome: Progressing   Problem: Coping: Goal: Level of anxiety will decrease Outcome: Progressing   Problem: Elimination: Goal: Will not experience complications related to bowel motility Outcome: Progressing   Problem: Pain Management: Goal: General experience of comfort will improve Outcome: Progressing   Problem: Safety: Goal: Ability to remain free from injury will improve Outcome: Progressing   Problem: Skin Integrity: Goal: Risk for impaired skin integrity will decrease Outcome: Progressing

## 2023-03-27 NOTE — Evaluation (Addendum)
Occupational Therapy Evaluation Patient Details Name: Kelly Howard MRN: [id number] DOB: [birth year] Today's Date: 03/27/2023   History of Present Illness Kelly Howard is a 72 y.o. female with hx of Parkinson's disease, Lewy body dementia with mood disturbance, frequent falls, recent history of a left clavicular fracture, who was transferred from [facility] ED due to syncopal episode. Sling for comfort, WBAT LUE.   Clinical Impression   Pt seen today for OT evaluation, spouse present and provides PLOF as pt is a poor historian with hx of dementia. PTA, pt requires assist with most ADLs (dressing, bathing, toileting, grooming) and mobility. Hx of 6 falls (occasionally uses a SPC or RW) in the last 6 months.   Currently, pt requires excessive time to respond to questions but when provided time, she is able to react and respond appropriately. Pt limited by decreased LUE use which impacts all ADLs and mobility, decreased activity tolerance, generalized weakness and impaired balance. Requires MAX A for bed mobility with increased time for motor planning (spouse indicated pt has not had her Parkinsons meds yet). Sits EOB with unilateal UE support for BP assessment. Lateral scoots towards HOB x 5 with MAX A. Further assessment deferred as pt requests to have a BM and was placed on bedpan (pt declines attempting BSC). Pt would benefit from skilled OT services to address noted impairments and functional limitations (see below for any additional details) in order to maximize safety and independence while minimizing falls risk and caregiver burden. Anticipate the need for follow up OT services upon acute hospital DC. RN updated on pt's status and vitals. Pt will need 24/7 assist and support upon hospital discharge with current level of physical and cognitive impairments.  Of note, BP assessed and pt denies dizziness/lightheadedness with positional changes.  Supine 128/82 (90) Sitting EOB 148/92  (108) Sitting EOB 122/80 (92) Sitting EOB 120/74 ( 90) Supine 134/62 (83)        If plan is discharge home, recommend the following: A lot of help with bathing/dressing/bathroom;A lot of help with walking and/or transfers;Assistance with cooking/housework;Direct supervision/assist for medications management;Direct supervision/assist for financial management;Assist for transportation;Supervision due to cognitive status    Functional Status Assessment  Patient has had a recent decline in their functional status and demonstrates the ability to make significant improvements in function in a reasonable and predictable amount of time.  Equipment Recommendations  None recommended by OT    Recommendations for Other Services Other (comment)     Precautions / Restrictions Precautions Precautions: Shoulder Type of Shoulder Precautions: L clavicle fx, sling Shoulder Interventions: Shoulder sling/immobilizer;For comfort Precaution Comments: Prior clavicular fracture noted, plan for outpatient follow-up with orthopedic surgery as scheduled, no indication for evaluation, imaging or intervention at this time -continue left upper extremity sling Required Braces or Orthoses: Sling Restrictions Weight Bearing Restrictions: Yes LLE Weight Bearing: Weight bearing as tolerated Other Position/Activity Restrictions: Per MD note: Prior clavicular fracture noted, plan for outpatient follow-up with orthopedic surgery as scheduled, no indication for evaluation, imaging or intervention at this time -continue left upper extremity sling      Mobility Bed Mobility Overal bed mobility: Needs Assistance Bed Mobility: Supine to Sit, Sit to Supine     Supine to sit: Max assist, HOB elevated Sit to supine: HOB elevated   General bed mobility comments: max A, used bed pads, pt requires cues for sequencing and encourgement    Transfers Overall transfer level: Needs assistance Equipment used: None Transfers:   (lateral scoot)  General transfer comment: lateral scoots x 5 toward HOB MAX A      Balance Overall balance assessment: Needs assistance, History of Falls Sitting-balance support: Single extremity supported, Feet supported Sitting balance-Leahy Scale: Fair                                     ADL either performed or assessed with clinical judgement   ADL Overall ADL's : Needs assistance/impaired                         Toilet Transfer: Stand-pivot;BSC/3in1;Maximal Dentist Details (indicate cue type and reason): clinical judgement         Functional mobility during ADLs: Cueing for safety;Cueing for sequencing General ADL Comments: Pt limited by decreased LUE use which impacts all ADLs and mobility, decreased activity tolerance, generalized weakness and impaired balance      Pertinent Vitals/Pain Pain Assessment Pain Assessment: No/denies pain     Extremity/Trunk Assessment Upper Extremity Assessment Upper Extremity Assessment: LUE deficits/detail;Generalized weakness LUE Deficits / Details: L clavicle fx (kept immobilized) LUE: Unable to fully assess due to immobilization LUE Coordination: decreased gross motor;decreased fine motor   Lower Extremity Assessment Lower Extremity Assessment: Generalized weakness       Communication Communication Communication: No apparent difficulties Cueing Techniques: Verbal cues;Tactile cues;Gestural cues;Visual cues (When given increased processing time, pt responds appropriately and follows commands consistently)   Cognition Arousal: Alert Behavior During Therapy: Flat affect, WFL for tasks assessed/performed Overall Cognitive Status: History of cognitive impairments - at baseline                                 General Comments: Delayed responses, A&Ox3, spouse assists with PLOF     General Comments  Bruising around L clavicle            Home  Living Family/patient expects to be discharged to:: Private residence Living Arrangements: Spouse/significant other Available Help at Discharge: Personal care attendant;Available 24 hours/day;Family Type of Home: House Home Access: Level entry     Home Layout: Two level;Able to live on main level with bedroom/bathroom     Bathroom Shower/Tub: Producer, television/film/video: Standard Bathroom Accessibility: Yes   Home Equipment: Agricultural consultant (2 wheels);Cane - single point;Shower seat - built in;Grab bars - toilet;Grab bars - tub/shower   Additional Comments: Husband states home is very accessible      Prior Functioning/Environment Prior Level of Function : Needs assist;History of Falls (last six months)  Cognitive Assist : ADLs (cognitive);Mobility (cognitive)     Physical Assist : Mobility (physical);ADLs (physical) Mobility (physical): Gait;Transfers;Stairs ADLs (physical): Grooming;Bathing;Dressing;Toileting;IADLs Mobility Comments: 6 falls in the past six months ADLs Comments: Per spouse, pt requires assist with most ADLs. Does not perform IADLs        OT Problem List: Decreased strength;Decreased range of motion;Decreased activity tolerance;Impaired balance (sitting and/or standing);Impaired vision/perception;Decreased coordination;Decreased cognition;Decreased safety awareness;Decreased knowledge of use of DME or AE;Decreased knowledge of precautions;Impaired UE functional use      OT Treatment/Interventions: Self-care/ADL training;Therapeutic exercise;Neuromuscular education;Energy conservation;DME and/or AE instruction;Patient/family education;Balance training;Cognitive remediation/compensation;Therapeutic activities    OT Goals(Current goals can be found in the care plan section) Acute Rehab OT Goals OT Goal Formulation: Patient unable to participate in goal setting Time For Goal Achievement: 04/10/23 Potential to Achieve Goals:  Fair ADL Goals Pt Will Perform  Upper Body Bathing: with min assist;sitting;with adaptive equipment;with caregiver independent in assisting Pt Will Perform Upper Body Dressing: with min assist;with adaptive equipment;sitting Pt Will Perform Lower Body Dressing: with min assist;with adaptive equipment;sitting/lateral leans;sit to/from stand Pt Will Transfer to Toilet: with mod assist;stand pivot transfer;bedside commode Pt Will Perform Toileting - Clothing Manipulation and hygiene: with min assist;sit to/from stand;sitting/lateral leans  OT Frequency: Min 1X/week       AM-PAC OT "6 Clicks" Daily Activity     Outcome Measure Help from another person eating meals?: None Help from another person taking care of personal grooming?: A Little Help from another person toileting, which includes using toliet, bedpan, or urinal?: A Lot Help from another person bathing (including washing, rinsing, drying)?: A Lot Help from another person to put on and taking off regular upper body clothing?: A Lot Help from another person to put on and taking off regular lower body clothing?: A Lot 6 Click Score: 15   End of Session Equipment Utilized During Treatment: Other (comment) Nurse Communication: Mobility status;Other (comment) (pt on bedpan)  Activity Tolerance: Patient tolerated treatment well Patient left: in bed;with call bell/phone within reach;with bed alarm set;with family/visitor present;Other (comment)  OT Visit Diagnosis: Unsteadiness on feet (R26.81);Other abnormalities of gait and mobility (R26.89);Repeated falls (R29.6);Muscle weakness (generalized) (M62.81)                Time: 8295-6213 OT Time Calculation (min): 34 min Charges:  OT General Charges $OT Visit: 1 Visit OT Evaluation $OT Eval Low Complexity: 1 Low OT Treatments $Self Care/Home Management : 8-22 mins  Liat Mayol L. Layza Summa, OTR/L  03/27/23, 1:28 PM

## 2023-03-28 DIAGNOSIS — R55 Syncope and collapse: Secondary | ICD-10-CM | POA: Diagnosis not present

## 2023-03-28 NOTE — Care Management Obs Status (Signed)
MEDICARE OBSERVATION STATUS NOTIFICATION   Patient Details  Name: Kelly Howard MRN: [id number] Date of Birth: Mar 23, 1951   Medicare Observation Status Notification Given:  Yes    Larrie Kass, LCSW 03/28/2023, 10:25 AM

## 2023-03-28 NOTE — Progress Notes (Signed)
Physical Therapy Treatment Patient Details Name: Kelly Howard MRN: [id number] DOB: [birth year] Today's Date: 03/28/2023   History of Present Illness Kelly Howard is a 72 y.o. female admitted on 03/26/23 with syncopal episode.  Pt with hx of Parkinson's disease, Lewy body dementia with mood disturbance, frequent falls, recent history of a left clavicular fracture    PT Comments  Pt agreeable to working with therapy. Assessed orthostatic vitals during session-values in flowsheets. Pt tolerated activity well. She wore thigh high TED hose during session.     If plan is discharge home, recommend the following: A little help with walking and/or transfers;A little help with bathing/dressing/bathroom;Assistance with cooking/housework;Assist for transportation;Help with stairs or ramp for entrance   Can travel by private vehicle        Equipment Recommendations  None recommended by PT    Recommendations for Other Services       Precautions / Restrictions Precautions Precautions: Fall Type of Shoulder Precautions: recent L clavicle fx Shoulder Interventions: Shoulder sling/immobilizer;For comfort Precaution Comments: Prior clavicular fracture noted, plan for outpatient follow-up with orthopedic surgery as scheduled, no indication for evaluation, imaging or intervention at this time -continue left upper extremity sling Required Braces or Orthoses: Sling Restrictions Weight Bearing Restrictions: No LLE Weight Bearing: Weight bearing as tolerated    Mobility  Bed Mobility Overal bed mobility: Needs Assistance Bed Mobility: Supine to Sit     Supine to sit: Contact guard, HOB elevated     General bed mobility comments: no assist; increased time    Transfers Overall transfer level: Needs assistance Equipment used: None, Rollator (4 wheels)   Sit to Stand: Contact guard assist           General transfer comment: CGA for safety. cues for safety.     Ambulation/Gait Ambulation/Gait assistance: Contact guard assist Gait Distance (Feet): 150 Feet Assistive device: Rollator (4 wheels) Gait Pattern/deviations: Step-through pattern, Decreased stride length       General Gait Details: cga for safety. pt c/o some lightheadedness after ~75 feet-she declined taking a seat to rest. ambulated back to room without incident. walked ~150 feet with rollator then another 20 feet without AD   Stairs             Wheelchair Mobility     Tilt Bed    Modified Rankin (Stroke Patients Only)       Balance Overall balance assessment: History of Falls, Needs assistance         Standing balance support: During functional activity Standing balance-Leahy Scale: Fair                              Cognition Arousal: Alert Behavior During Therapy: WFL for tasks assessed/performed Overall Cognitive Status: Impaired/Different from baseline                                 General Comments: Delayed responses, A&Ox3, spouse assists with PLOF; follows basic commands; requiring repetition, random statements at  times, spouse reports more confused than normal        Exercises      General Comments        Pertinent Vitals/Pain Pain Assessment Pain Assessment: No/denies pain    Home Living                          Prior  Function            PT Goals (current goals can now be found in the care plan section) Progress towards PT goals: Progressing toward goals    Frequency    Min 1X/week      PT Plan      Co-evaluation              AM-PAC PT "6 Clicks" Mobility   Outcome Measure  Help needed turning from your back to your side while in a flat bed without using bedrails?: A Little Help needed moving from lying on your back to sitting on the side of a flat bed without using bedrails?: A Little Help needed moving to and from a bed to a chair (including a wheelchair)?: A  Little Help needed standing up from a chair using your arms (e.g., wheelchair or bedside chair)?: A Little Help needed to walk in hospital room?: A Little Help needed climbing 3-5 steps with a railing? : A Lot 6 Click Score: 17    End of Session Equipment Utilized During Treatment: Gait belt Activity Tolerance: Patient tolerated treatment well Patient left: in chair;with call bell/phone within reach;with chair alarm set   PT Visit Diagnosis: Other abnormalities of gait and mobility (R26.89);Muscle weakness (generalized) (M62.81);History of falling (Z91.81)     Time: 1610-9604 PT Time Calculation (min) (ACUTE ONLY): 32 min  Charges:    $Gait Training: 23-37 mins PT General Charges $$ ACUTE PT VISIT: 1 Visit                         Faye Ramsay, PT Acute Rehabilitation  Office: [phone]

## 2023-03-28 NOTE — Plan of Care (Signed)
  Problem: Clinical Measurements: Goal: Diagnostic test results will improve Outcome: Progressing Goal: Cardiovascular complication will be avoided Outcome: Progressing   Problem: Activity: Goal: Risk for activity intolerance will decrease Outcome: Progressing   Problem: Nutrition: Goal: Adequate nutrition will be maintained Outcome: Progressing   Problem: Coping: Goal: Level of anxiety will decrease Outcome: Progressing   Problem: Elimination: Goal: Will not experience complications related to bowel motility Outcome: Progressing   Problem: Pain Management: Goal: General experience of comfort will improve Outcome: Progressing   Problem: Safety: Goal: Ability to remain free from injury will improve Outcome: Progressing   Problem: Skin Integrity: Goal: Risk for impaired skin integrity will decrease Outcome: Progressing

## 2023-03-28 NOTE — Progress Notes (Signed)
AVS reviewed w/ pt & husband - HHS set up thru TOC - Pt eating lunch &then will leave w/ husband- PIV removed as documented - This RN assisted pt w/ dressing or d/c to home. Husband verbalized an understanding

## 2023-03-28 NOTE — Discharge Summary (Signed)
Physician Discharge Summary  Kelly Howard ZOX:[id number] DOB: Sep 03, 1950 DOA: 03/26/2023  PCP: Philip Aspen, Limmie Patricia, MD  Admit date: 03/26/2023 Discharge date: 03/28/2023  Admitted From: Home Disposition: Home  Recommendations for Outpatient Follow-up:  Follow up with PCP in 1-2 weeks Follow-up with neurology as scheduled  Home Health: PT OT Equipment/Devices: No new equipment  Discharge Condition: Stable CODE STATUS: Full Diet recommendation: As tolerated, liberalize diet to improve p.o. intake from both a caloric as well as volume standpoints  Brief/Interim Summary: Kelly Howard is a 72 y.o. female with hx of Parkinson's disease, Lewy body dementia with mood disturbance, frequent falls, recent history of a left clavicular fracture(10/22), who was transferred from [facility] ED due to reported syncopal episode at home.  Per husband no traumatic fall, he was able to catch her  just prior to the fall.  He notes she was altered (foggy) more so than usual and presented to the ED given her syncopal episode.  Hospitalist called for admission.  Patient admitted as above with episode of syncope and fall, noted to have multiple mechanical falls and fall secondary to presyncope in the past, recent left clavicular fracture noted to have occurred 1 week prior to hospitalization.  Patient's orthostatic blood pressures were somewhat worrisome at intake, improving with thigh-high compression stockings bilaterally as well as abdominal binder.  At this time patient symptoms have markedly improved, but have not completely resolved, as such she remains high risk for falls in the future.  PT OT recommending ongoing therapy at home.  Close follow-up with neurology and PCP in the next 1 to 2 weeks to ensure improvement with orthostatic symptoms and blood pressure.  Patient may benefit from midodrine or similar with the caveat she may have elevated resting blood pressure while supine or seated.   Risks and benefits of this medication were discussed with husband who will follow up with primary team outpatient for further recommendations.  Discharge Diagnoses:  Principal Problem:   Syncope  Syncopal episode Recent fall and left clavicular fracture(POA) -Likely orthostatic in nature given improvement with fluids, stockings and abdominal binder -Noted orthostatic hypotension history, not uncommon for Parkinson's patients -also has history of questionable vagal episodes -PT/OT eval completed - continues to have symptoms but markedly improved -Prior clavicular fracture noted, plan for outpatient follow-up with orthopedic surgery as scheduled, no indication for evaluation, imaging or intervention at this time -continue left upper extremity sling   Chronic medical problems: Parkinson's with Lewy body dementia with behavioral disturbance: Continue home carbidopa-levodopa, pimavanserin, rivastigmine Hypothyroidism: Continue home levothyroxine Mood disorder: See above and Parkinson's, also continue sertraline, trazodone.  Discharge Instructions   Allergies as of 03/28/2023       Reactions   Codeine Other (See Comments)   GI Upset   Escitalopram Swelling, Other (See Comments)   Feet swell   Propoxyphene Other (See Comments)   Hallucinations        Medication List     TAKE these medications    acetaminophen 325 MG tablet Commonly known as: TYLENOL Take 325-650 mg by mouth as needed for mild pain (pain score 1-3) or moderate pain (pain score 4-6).   AMBULATORY NON FORMULARY MEDICATION Abdominal compression binder Dx: G20   cyanocobalamin 1000 MCG/ML injection Commonly known as: VITAMIN B12 Inject into the muscle every 30 (thirty) days.   levothyroxine 50 MCG tablet Commonly known as: SYNTHROID Take 50 mcg by mouth daily before breakfast.   loratadine 10 MG tablet Commonly known as: CLARITIN Take  10 mg by mouth at bedtime.   Nuplazid 34 MG Caps Generic drug:  Pimavanserin Tartrate Take 1 capsule by mouth daily.   Prolia 60 MG/ML Sosy injection Generic drug: denosumab Inject 60 mg into the skin every 6 (six) months.   rivastigmine 9.5 mg/24hr Commonly known as: EXELON Place 9.5 mg onto the skin daily.   Rytary 48.75-195 MG Cpcr Generic drug: Carbidopa-Levodopa ER Take 2 tablets by mouth See admin instructions. 2 tabs 6am, 3 tabs at noon, 3 tabs at 6pm, 2 tabs at 10pm.   sertraline 100 MG tablet Commonly known as: ZOLOFT TAKE TWO TABLETS DAILY. What changed:  how much to take how to take this when to take this additional instructions   traZODone 50 MG tablet Commonly known as: DESYREL Take 12.5-50 mg by mouth See admin instructions. Take 50 mg by mouth at bedtime. Take 12.5 mg by mouth as needed for anxiety   Vibegron 75 MG Tabs Take 75 mg by mouth at bedtime.   VITAMIN D PO Take 1 capsule by mouth daily.        Allergies  Allergen Reactions   Codeine Other (See Comments)    GI Upset   Escitalopram Swelling and Other (See Comments)    Feet swell   Propoxyphene Other (See Comments)    Hallucinations    Consultations: None  Procedures/Studies: CT Head Wo Contrast  Result Date: 03/26/2023 CLINICAL DATA:  Head trauma mental status change EXAM: CT HEAD WITHOUT CONTRAST TECHNIQUE: Contiguous axial images were obtained from the base of the skull through the vertex without intravenous contrast. RADIATION DOSE REDUCTION: This exam was performed according to the departmental dose-optimization program which includes automated exposure control, adjustment of the mA and/or kV according to patient size and/or use of iterative reconstruction technique. COMPARISON:  CT brain 05/21/2022 FINDINGS: Brain: No acute territorial infarction, hemorrhage or intracranial mass. Mild atrophy. Nonenlarged ventricles Vascular: No hyperdense vessels.  No unexpected calcification Skull: Normal. Negative for fracture or focal lesion. Sinuses/Orbits:  No acute finding. Other: None IMPRESSION: No CT evidence for acute intracranial abnormality Electronically Signed   By: Jasmine Pang M.D.   On: 03/26/2023 17:49   DG Chest Portable 1 View  Result Date: 03/26/2023 CLINICAL DATA:  Syncope, fall EXAM: PORTABLE CHEST 1 VIEW COMPARISON:  01/26/2023 FINDINGS: The heart size and mediastinal contours are within normal limits. Both lungs are clear. No acute osseous findings. Chronic fracture deformities of the lateral right ribs. IMPRESSION: No acute abnormality of the lungs in AP portable projection. Electronically Signed   By: Jearld Lesch M.D.   On: 03/26/2023 17:48   DG Shoulder Left  Result Date: 03/21/2023 CLINICAL DATA:  Fall, injury. EXAM: LEFT SHOULDER - 2+ VIEW; RIGHT KNEE - COMPLETE 4+ VIEW COMPARISON:  12/16/2018. FINDINGS: Left shoulder: There is a minimally displaced fracture of the distal left clavicle. The remaining bony structures are intact and there is no dislocation. Aortic atherosclerosis is noted. Right knee: No acute fracture or dislocation. Mild degenerative changes are present in the lateral and patellofemoral compartments. No joint effusion. Soft tissues are within normal limits. IMPRESSION: 1. Mildly displaced fracture of the distal left clavicle. 2. No acute fracture or dislocation at the right knee. Electronically Signed   By: Thornell Sartorius M.D.   On: 03/21/2023 04:17   DG Knee Complete 4 Views Right  Result Date: 03/21/2023 CLINICAL DATA:  Fall, injury. EXAM: LEFT SHOULDER - 2+ VIEW; RIGHT KNEE - COMPLETE 4+ VIEW COMPARISON:  12/16/2018. FINDINGS: Left shoulder:  There is a minimally displaced fracture of the distal left clavicle. The remaining bony structures are intact and there is no dislocation. Aortic atherosclerosis is noted. Right knee: No acute fracture or dislocation. Mild degenerative changes are present in the lateral and patellofemoral compartments. No joint effusion. Soft tissues are within normal limits. IMPRESSION:  1. Mildly displaced fracture of the distal left clavicle. 2. No acute fracture or dislocation at the right knee. Electronically Signed   By: Thornell Sartorius M.D.   On: 03/21/2023 04:17     Subjective: No acute issues or events overnight   Discharge Exam: Vitals:   03/27/23 2035 03/28/23 0608  BP: (!) 105/55 110/69  Pulse: 72 82  Resp: 18 20  Temp: 98.6 F (37 C) 98.3 F (36.8 C)  SpO2: 97% 97%   Vitals:   03/27/23 0609 03/27/23 1205 03/27/23 2035 03/28/23 0608  BP: (!) 140/84 130/71 (!) 105/55 110/69  Pulse: 67 72 72 82  Resp: 18 16 18 20   Temp: 98.1 F (36.7 C) 98.2 F (36.8 C) 98.6 F (37 C) 98.3 F (36.8 C)  TempSrc: Oral Oral Oral Oral  SpO2: 97% 96% 97% 97%  Weight:      Height:        General:  Pleasantly resting in bed, No acute distress. HEENT:  Normocephalic atraumatic.  Sclerae nonicteric, noninjected.  Extraocular movements intact bilaterally. Neck:  Without mass or deformity.  Trachea is midline. Lungs:  Clear to auscultate bilaterally without rhonchi, wheeze, or rales. Heart:  Regular rate and rhythm.  Without murmurs, rubs, or gallops. Abdomen:  Soft, nontender, nondistended.  Without guarding or rebound. Extremities: Left upper extremity internally rotated held to the chest, notable ecchymosis over left anterior chest wall and shoulder  The results of significant diagnostics from this hospitalization (including imaging, microbiology, ancillary and laboratory) are listed below for reference.     Microbiology: No results found for this or any previous visit (from the past 240 hour(s)).   Labs: BNP (last 3 results) Recent Labs    05/21/22 0545  BNP 33.5   Basic Metabolic Panel: Recent Labs  Lab 03/26/23 1620  NA 138  K 3.5  CL 100  CO2 32  GLUCOSE 96  BUN 19  CREATININE 0.56  CALCIUM 9.5   Liver Function Tests: Recent Labs  Lab 03/26/23 1620  AST 12*  ALT <5  ALKPHOS 54  BILITOT 0.6  PROT 6.8  ALBUMIN 4.6   CBC: Recent Labs   Lab 03/26/23 1620  WBC 6.2  HGB 10.9*  HCT 33.9*  MCV 88.3  PLT 310   Urinalysis    Component Value Date/Time   COLORURINE COLORLESS (A) 03/26/2023 2023   APPEARANCEUR CLEAR 03/26/2023 2023   LABSPEC <1.005 (L) 03/26/2023 2023   PHURINE 6.5 03/26/2023 2023   GLUCOSEU NEGATIVE 03/26/2023 2023   GLUCOSEU NEGATIVE 09/03/2020 1604   HGBUR NEGATIVE 03/26/2023 2023   BILIRUBINUR NEGATIVE 03/26/2023 2023   BILIRUBINUR neg 10/28/2020 1520   KETONESUR NEGATIVE 03/26/2023 2023   PROTEINUR NEGATIVE 03/26/2023 2023   UROBILINOGEN 0.2 10/28/2020 1520   UROBILINOGEN 0.2 09/03/2020 1604   NITRITE NEGATIVE 03/26/2023 2023   LEUKOCYTESUR NEGATIVE 03/26/2023 2023   Sepsis Labs Recent Labs  Lab 03/26/23 1620  WBC 6.2   Microbiology No results found for this or any previous visit (from the past 240 hour(s)).   Time coordinating discharge: Over 30 minutes  SIGNED:   Azucena Fallen, DO [facility] 03/28/2023, 11:52 AM Pager   If  7PM-7AM, please contact night-coverage [email or web address]

## 2023-03-29 ENCOUNTER — Telehealth: Payer: Self-pay

## 2023-03-29 NOTE — Transitions of Care (Post Inpatient/ED Visit) (Unsigned)
   03/29/2023  Name: Kelly Howard MRN: [id number] DOB: [birth year]  Today's TOC FU Call Status: Today's TOC FU Call Status:: Unsuccessful Call (1st Attempt) Unsuccessful Call (1st Attempt) Date: 03/29/23  Attempted to reach the patient regarding the most recent Inpatient/ED visit.  Follow Up Plan: Additional outreach attempts will be made to reach the patient to complete the Transitions of Care (Post Inpatient/ED visit) call.   Signature Karena Addison, LPN [facility] Nurse Health Advisor Direct Dial [phone]

## 2023-03-30 NOTE — Transitions of Care (Post Inpatient/ED Visit) (Signed)
03/30/2023  Name: Kelly Howard MRN: [id number] DOB: 1951-03-01  Today's TOC FU Call Status: Today's TOC FU Call Status:: Successful TOC FU Call Completed Unsuccessful Call (1st Attempt) Date: 03/29/23 [facility] FU Call Complete Date: 03/30/23 Patient's Name and Date of Birth confirmed.  Transition Care Management Follow-up Telephone Call Date of Discharge: 03/28/23 Discharge Facility: [facility]) Type of Discharge: Inpatient Admission Primary Inpatient Discharge Diagnosis:: syncope How have you been since you were released from the hospital?: Better Any questions or concerns?: Yes Patient Questions/Concerns:: per patients's spouse, she is taking trazodone 50mg , but still isn't sleepy well  Items Reviewed: Did you receive and understand the discharge instructions provided?: Yes Medications obtained,verified, and reconciled?: Yes (Medications Reviewed) Any new allergies since your discharge?: No Dietary orders reviewed?: Yes Do you have support at home?: Yes People in Home: spouse  Medications Reviewed Today: Medications Reviewed Today     Reviewed by Karena Addison, LPN (Licensed Practical Nurse) on 03/30/23 at 1609  Med List Status: <None>   Medication Order Taking? Sig Documenting Provider Last Dose Status Informant  acetaminophen (TYLENOL) 325 MG tablet [id number] No Take 325-650 mg by mouth as needed for mild pain (pain score 1-3) or moderate pain (pain score 4-6). [provider] 03/26/2023 Active Spouse/Significant Other, Pharmacy Records  AMBULATORY NON FORMULARY MEDICATION [id number] No Abdominal compression binder Dx: Olive Bass, DO Taking Active Spouse/Significant Other, Pharmacy Records  Carbidopa-Levodopa ER (RYTARY) 48.75-195 MG CPCR [id number] No Take 2 tablets by mouth See admin instructions. 2 tabs 6am, 3 tabs at noon, 3 tabs at 6pm, 2 tabs at 10pm. [provider] 03/26/2023 Active Spouse/Significant Other, Pharmacy Records   cyanocobalamin (VITAMIN B12) 1000 MCG/ML injection [id number] No Inject into the muscle every 30 (thirty) days. [provider] 03/22/2023 Active Spouse/Significant Other, Pharmacy Records           Med Note Epimenio Sarin, Carlota Raspberry Mar 27, 2023 11:26 AM) Given by provider. SO is unsure of the dose that is injected.   denosumab (PROLIA) 60 MG/ML SOSY injection [id number] No Inject 60 mg into the skin every 6 (six) months. [provider] august Active Spouse/Significant Other, Pharmacy Records  levothyroxine (SYNTHROID, LEVOTHROID) 50 MCG tablet [id number] No Take 50 mcg by mouth daily before breakfast. [provider] 03/26/2023 Active Spouse/Significant Other, Pharmacy Records  loratadine (CLARITIN) 10 MG tablet [id number] No Take 10 mg by mouth at bedtime. [provider] 03/25/2023 Active Spouse/Significant Other, Pharmacy Records  NUPLAZID 34 MG CAPS [id number] No Take 1 capsule by mouth daily. [provider] 03/26/2023 Active Spouse/Significant Other, Pharmacy Records  rivastigmine (EXELON) 9.5 mg/24hr [id number] No Place 9.5 mg onto the skin daily. [provider] 03/26/2023 Active Spouse/Significant Other, Pharmacy Records  sertraline (ZOLOFT) 100 MG tablet [id number] No TAKE TWO TABLETS DAILY.  Patient taking differently: Take 100 mg by mouth daily.   Mozingo, Thereasa Solo, NP 03/26/2023 Active Spouse/Significant Other, Pharmacy Records  traZODone (DESYREL) 50 MG tablet [id number] No Take 12.5-50 mg by mouth See admin instructions. Take 50 mg by mouth at bedtime. Take 12.5 mg by mouth as needed for anxiety [provider] 03/25/2023 Active Spouse/Significant Other, Pharmacy Records  Vibegron 75 MG TABS [id number] No Take 75 mg by mouth at bedtime. [provider] 03/25/2023 Active Spouse/Significant Other, Pharmacy Records  VITAMIN D PO [id number] No Take 1 capsule by mouth daily. [provider] 03/26/2023 Active  Spouse/Significant Other, Pharmacy Records  Home Care and Equipment/Supplies: Were Home Health Services Ordered?: Yes Name of Home Health Agency:: [facility] Has Agency set up a time to come to your home?: Yes First Home Health Visit Date: 03/31/23 Any new equipment or medical supplies ordered?: NA  Functional Questionnaire: Do you need assistance with bathing/showering or dressing?: Yes Do you need assistance with meal preparation?: Yes Do you need assistance with eating?: No Do you have difficulty maintaining continence: No Do you need assistance with getting out of bed/getting out of a chair/moving?: No Do you have difficulty managing or taking your medications?: Yes  Follow up appointments reviewed: PCP Follow-up appointment confirmed?: Yes Date of PCP follow-up appointment?: 04/04/23 Follow-up Provider: [facility] Follow-up appointment confirmed?: Yes Date of Specialist follow-up appointment?: 04/05/23 Follow-Up Specialty Provider:: [facility] Do you need transportation to your follow-up appointment?: No Do you understand care options if your condition(s) worsen?: Yes-patient verbalized understanding    SIGNATURE Karena Addison, LPN [facility] Nurse Health Advisor Direct Dial [phone]

## 2023-03-31 DIAGNOSIS — M25512 Pain in left shoulder: Secondary | ICD-10-CM | POA: Diagnosis not present

## 2023-04-01 DIAGNOSIS — I951 Orthostatic hypotension: Secondary | ICD-10-CM | POA: Diagnosis not present

## 2023-04-01 DIAGNOSIS — I700 Atherosclerosis of aorta: Secondary | ICD-10-CM | POA: Diagnosis not present

## 2023-04-01 DIAGNOSIS — E538 Deficiency of other specified B group vitamins: Secondary | ICD-10-CM | POA: Diagnosis not present

## 2023-04-01 DIAGNOSIS — Z79899 Other long term (current) drug therapy: Secondary | ICD-10-CM | POA: Diagnosis not present

## 2023-04-01 DIAGNOSIS — M80012D Age-related osteoporosis with current pathological fracture, left shoulder, subsequent encounter for fracture with routine healing: Secondary | ICD-10-CM | POA: Diagnosis not present

## 2023-04-01 DIAGNOSIS — E039 Hypothyroidism, unspecified: Secondary | ICD-10-CM | POA: Diagnosis not present

## 2023-04-01 DIAGNOSIS — G3183 Dementia with Lewy bodies: Secondary | ICD-10-CM | POA: Diagnosis not present

## 2023-04-01 DIAGNOSIS — F02811 Dementia in other diseases classified elsewhere, unspecified severity, with agitation: Secondary | ICD-10-CM | POA: Diagnosis not present

## 2023-04-01 DIAGNOSIS — F0283 Dementia in other diseases classified elsewhere, unspecified severity, with mood disturbance: Secondary | ICD-10-CM | POA: Diagnosis not present

## 2023-04-03 ENCOUNTER — Telehealth: Payer: Self-pay | Admitting: Internal Medicine

## 2023-04-03 DIAGNOSIS — Z79899 Other long term (current) drug therapy: Secondary | ICD-10-CM | POA: Diagnosis not present

## 2023-04-03 DIAGNOSIS — I700 Atherosclerosis of aorta: Secondary | ICD-10-CM | POA: Diagnosis not present

## 2023-04-03 DIAGNOSIS — F02811 Dementia in other diseases classified elsewhere, unspecified severity, with agitation: Secondary | ICD-10-CM | POA: Diagnosis not present

## 2023-04-03 DIAGNOSIS — E538 Deficiency of other specified B group vitamins: Secondary | ICD-10-CM | POA: Diagnosis not present

## 2023-04-03 DIAGNOSIS — G3183 Dementia with Lewy bodies: Secondary | ICD-10-CM | POA: Diagnosis not present

## 2023-04-03 DIAGNOSIS — I951 Orthostatic hypotension: Secondary | ICD-10-CM | POA: Diagnosis not present

## 2023-04-03 DIAGNOSIS — M80012D Age-related osteoporosis with current pathological fracture, left shoulder, subsequent encounter for fracture with routine healing: Secondary | ICD-10-CM | POA: Diagnosis not present

## 2023-04-03 DIAGNOSIS — E039 Hypothyroidism, unspecified: Secondary | ICD-10-CM | POA: Diagnosis not present

## 2023-04-03 DIAGNOSIS — F0283 Dementia in other diseases classified elsewhere, unspecified severity, with mood disturbance: Secondary | ICD-10-CM | POA: Diagnosis not present

## 2023-04-03 NOTE — Telephone Encounter (Signed)
Stephanie OT [facility] need VO for OT 1x eow 2 wks and then 1x4

## 2023-04-04 ENCOUNTER — Encounter: Payer: Self-pay | Admitting: Internal Medicine

## 2023-04-04 ENCOUNTER — Ambulatory Visit: Payer: Medicare PPO | Admitting: Internal Medicine

## 2023-04-04 VITALS — BP 110/64 | HR 70 | Temp 97.8°F | Wt 109.7 lb

## 2023-04-04 DIAGNOSIS — Z09 Encounter for follow-up examination after completed treatment for conditions other than malignant neoplasm: Secondary | ICD-10-CM

## 2023-04-04 DIAGNOSIS — G20B2 Parkinson's disease with dyskinesia, with fluctuations: Secondary | ICD-10-CM | POA: Diagnosis not present

## 2023-04-04 DIAGNOSIS — R296 Repeated falls: Secondary | ICD-10-CM

## 2023-04-04 DIAGNOSIS — I951 Orthostatic hypotension: Secondary | ICD-10-CM | POA: Diagnosis not present

## 2023-04-04 NOTE — Telephone Encounter (Signed)
Left detailed message on machine for [facility] with verbal order for OT.

## 2023-04-04 NOTE — Progress Notes (Signed)
Hospital follow-up visit     CC/Reason for Visit: Hospitalization follow-up  HPI: Kelly Howard is a 72 y.o. female who is coming in today for the above mentioned reasons, specifically transitional care services face-to-face visit.    Dates hospitalized: 03/26/2023-03/28/2023 Days since discharge from hospital: 7 Patient was discharged from the hospital to: Home Reason for admission to hospital: Syncope, frequent falls Date of interactive phone contact with patient and/or caregiver: 03/30/2023  I have reviewed in detail patient's discharge summary plus pertinent specific notes, labs, and images from the hospitalization.  Yes  Patient was admitted to the hospital after syncopal event at home.  She has known Parkinson's disease and had positive orthostatic vital signs on admission.  She improved somewhat with compression stockings and abdominal binder and was discharged home.  Per husband report she was discharged with compression stockings but not with the abdominal binder.  She has been doing well since discharge.  They now have a caregiver in the house.  Medication reconciliation was done today and patient is taking meds as recommended by discharging hospitalist/specialist.  Yes   Past Medical/Surgical History: Past Medical History:  Diagnosis Date   Frequent falls    Hypothyroidism    Osteoporosis    Parkinson's disease (HCC)     Past Surgical History:  Procedure Laterality Date   TONSILLECTOMY     WRIST SURGERY      Social History:  reports that she has never smoked. She has been exposed to tobacco smoke. She has never used smokeless tobacco. She reports current alcohol use. She reports that she does not use drugs.  Allergies: Allergies  Allergen Reactions   Codeine Other (See Comments)    GI Upset   Escitalopram Swelling and Other (See Comments)    Feet swell   Propoxyphene Other (See Comments)    Hallucinations    Family History:  Family  History  Problem Relation Age of Onset   Hypercalcemia Mother    Cancer Paternal Grandmother    Diabetes Paternal Grandfather      Current Outpatient Medications:    acetaminophen (TYLENOL) 325 MG tablet, Take 325-650 mg by mouth as needed for mild pain (pain score 1-3) or moderate pain (pain score 4-6)., Disp: , Rfl:    AMBULATORY NON FORMULARY MEDICATION, Abdominal compression binder Dx: G20, Disp: 1 Device, Rfl: 0   Carbidopa-Levodopa ER (RYTARY) 48.75-195 MG CPCR, Take 2 tablets by mouth See admin instructions. 2 tabs 6am, 3 tabs at noon, 3 tabs at 6pm, 2 tabs at 10pm., Disp: , Rfl:    cyanocobalamin (VITAMIN B12) 1000 MCG/ML injection, Inject into the muscle every 30 (thirty) days., Disp: , Rfl:    denosumab (PROLIA) 60 MG/ML SOSY injection, Inject 60 mg into the skin every 6 (six) months., Disp: , Rfl:    levothyroxine (SYNTHROID, LEVOTHROID) 50 MCG tablet, Take 50 mcg by mouth daily before breakfast., Disp: , Rfl:    loratadine (CLARITIN) 10 MG tablet, Take 10 mg by mouth at bedtime., Disp: , Rfl:    NUPLAZID 34 MG CAPS, Take 1 capsule by mouth daily., Disp: , Rfl:    rivastigmine (EXELON) 9.5 mg/24hr, Place 9.5 mg onto the skin daily., Disp: , Rfl:    sertraline (ZOLOFT) 100 MG tablet, TAKE TWO TABLETS DAILY. (Patient taking differently: Take 100 mg by mouth daily.), Disp: 180 tablet, Rfl: 3   traZODone (DESYREL) 50 MG tablet, Take 12.5-50 mg by mouth See admin instructions. Take 50 mg by mouth at  bedtime. Take 12.5 mg by mouth as needed for anxiety, Disp: , Rfl:    Vibegron 75 MG TABS, Take 75 mg by mouth at bedtime., Disp: , Rfl:    VITAMIN D PO, Take 1 capsule by mouth daily., Disp: , Rfl:   Review of Systems:  Negative except as mentioned in HPI.    Physical Exam: Vitals:   04/04/23 1132  BP: 110/64  Pulse: 70  Temp: 97.8 F (36.6 C)  TempSrc: Oral  SpO2: (!) 70%  Weight: 109 lb 11.2 oz (49.8 kg)    Body mass index is 17.71 kg/m.   Physical Exam Vitals  reviewed.  Constitutional:      Appearance: Normal appearance.  HENT:     Head: Normocephalic and atraumatic.  Eyes:     Conjunctiva/sclera: Conjunctivae normal.     Pupils: Pupils are equal, round, and reactive to light.  Skin:    General: Skin is warm and dry.  Neurological:     General: No focal deficit present.     Mental Status: She is alert and oriented to person, place, and time.  Psychiatric:        Mood and Affect: Mood normal.        Behavior: Behavior normal.        Thought Content: Thought content normal.        Judgment: Judgment normal.     Impression and Plan:  Hospital discharge follow-up  Frequent falls  Parkinson's disease with dyskinesia and fluctuating manifestations (HCC)  Orthostatic hypotension  -Hospital charts reviewed in detail today. -She is not orthostatic today, husband will stop by medical supply store to get abdominal binder.  She already has thigh-high compression stockings.  They will continue follow-up with neurology as scheduled.  Medical decision making of moderate complexity was utilized today.      Chaya Jan, MD [place] [facility]

## 2023-04-06 DIAGNOSIS — F02811 Dementia in other diseases classified elsewhere, unspecified severity, with agitation: Secondary | ICD-10-CM | POA: Diagnosis not present

## 2023-04-06 DIAGNOSIS — M80012D Age-related osteoporosis with current pathological fracture, left shoulder, subsequent encounter for fracture with routine healing: Secondary | ICD-10-CM | POA: Diagnosis not present

## 2023-04-06 DIAGNOSIS — G3183 Dementia with Lewy bodies: Secondary | ICD-10-CM | POA: Diagnosis not present

## 2023-04-06 DIAGNOSIS — E538 Deficiency of other specified B group vitamins: Secondary | ICD-10-CM | POA: Diagnosis not present

## 2023-04-06 DIAGNOSIS — Z79899 Other long term (current) drug therapy: Secondary | ICD-10-CM | POA: Diagnosis not present

## 2023-04-06 DIAGNOSIS — I951 Orthostatic hypotension: Secondary | ICD-10-CM | POA: Diagnosis not present

## 2023-04-06 DIAGNOSIS — F0283 Dementia in other diseases classified elsewhere, unspecified severity, with mood disturbance: Secondary | ICD-10-CM | POA: Diagnosis not present

## 2023-04-06 DIAGNOSIS — I700 Atherosclerosis of aorta: Secondary | ICD-10-CM | POA: Diagnosis not present

## 2023-04-06 DIAGNOSIS — E039 Hypothyroidism, unspecified: Secondary | ICD-10-CM | POA: Diagnosis not present

## 2023-04-10 ENCOUNTER — Telehealth: Payer: Self-pay | Admitting: Internal Medicine

## 2023-04-10 DIAGNOSIS — F02811 Dementia in other diseases classified elsewhere, unspecified severity, with agitation: Secondary | ICD-10-CM | POA: Diagnosis not present

## 2023-04-10 DIAGNOSIS — G3183 Dementia with Lewy bodies: Secondary | ICD-10-CM | POA: Diagnosis not present

## 2023-04-10 DIAGNOSIS — E039 Hypothyroidism, unspecified: Secondary | ICD-10-CM | POA: Diagnosis not present

## 2023-04-10 DIAGNOSIS — E538 Deficiency of other specified B group vitamins: Secondary | ICD-10-CM | POA: Diagnosis not present

## 2023-04-10 DIAGNOSIS — I951 Orthostatic hypotension: Secondary | ICD-10-CM | POA: Diagnosis not present

## 2023-04-10 DIAGNOSIS — Z79899 Other long term (current) drug therapy: Secondary | ICD-10-CM | POA: Diagnosis not present

## 2023-04-10 DIAGNOSIS — I700 Atherosclerosis of aorta: Secondary | ICD-10-CM | POA: Diagnosis not present

## 2023-04-10 DIAGNOSIS — M80012D Age-related osteoporosis with current pathological fracture, left shoulder, subsequent encounter for fracture with routine healing: Secondary | ICD-10-CM | POA: Diagnosis not present

## 2023-04-10 DIAGNOSIS — F0283 Dementia in other diseases classified elsewhere, unspecified severity, with mood disturbance: Secondary | ICD-10-CM | POA: Diagnosis not present

## 2023-04-10 NOTE — Telephone Encounter (Signed)
Left detailed message on machine for [name] with Dr Hardie Shackleton response.

## 2023-04-10 NOTE — Telephone Encounter (Signed)
Move home health ST eval to next medicare week due to patient scheduling conflict

## 2023-04-13 DIAGNOSIS — F0283 Dementia in other diseases classified elsewhere, unspecified severity, with mood disturbance: Secondary | ICD-10-CM | POA: Diagnosis not present

## 2023-04-13 DIAGNOSIS — G3183 Dementia with Lewy bodies: Secondary | ICD-10-CM | POA: Diagnosis not present

## 2023-04-13 DIAGNOSIS — I700 Atherosclerosis of aorta: Secondary | ICD-10-CM | POA: Diagnosis not present

## 2023-04-13 DIAGNOSIS — M80012D Age-related osteoporosis with current pathological fracture, left shoulder, subsequent encounter for fracture with routine healing: Secondary | ICD-10-CM | POA: Diagnosis not present

## 2023-04-13 DIAGNOSIS — I951 Orthostatic hypotension: Secondary | ICD-10-CM | POA: Diagnosis not present

## 2023-04-13 DIAGNOSIS — E039 Hypothyroidism, unspecified: Secondary | ICD-10-CM | POA: Diagnosis not present

## 2023-04-13 DIAGNOSIS — Z79899 Other long term (current) drug therapy: Secondary | ICD-10-CM | POA: Diagnosis not present

## 2023-04-13 DIAGNOSIS — F02811 Dementia in other diseases classified elsewhere, unspecified severity, with agitation: Secondary | ICD-10-CM | POA: Diagnosis not present

## 2023-04-13 DIAGNOSIS — E538 Deficiency of other specified B group vitamins: Secondary | ICD-10-CM | POA: Diagnosis not present

## 2023-04-13 DIAGNOSIS — R350 Frequency of micturition: Secondary | ICD-10-CM | POA: Diagnosis not present

## 2023-04-14 DIAGNOSIS — G3183 Dementia with Lewy bodies: Secondary | ICD-10-CM | POA: Diagnosis not present

## 2023-04-14 DIAGNOSIS — F02811 Dementia in other diseases classified elsewhere, unspecified severity, with agitation: Secondary | ICD-10-CM | POA: Diagnosis not present

## 2023-04-14 DIAGNOSIS — F0283 Dementia in other diseases classified elsewhere, unspecified severity, with mood disturbance: Secondary | ICD-10-CM | POA: Diagnosis not present

## 2023-04-14 DIAGNOSIS — I700 Atherosclerosis of aorta: Secondary | ICD-10-CM | POA: Diagnosis not present

## 2023-04-14 DIAGNOSIS — Z79899 Other long term (current) drug therapy: Secondary | ICD-10-CM | POA: Diagnosis not present

## 2023-04-14 DIAGNOSIS — E039 Hypothyroidism, unspecified: Secondary | ICD-10-CM | POA: Diagnosis not present

## 2023-04-14 DIAGNOSIS — E538 Deficiency of other specified B group vitamins: Secondary | ICD-10-CM | POA: Diagnosis not present

## 2023-04-14 DIAGNOSIS — M80012D Age-related osteoporosis with current pathological fracture, left shoulder, subsequent encounter for fracture with routine healing: Secondary | ICD-10-CM | POA: Diagnosis not present

## 2023-04-14 DIAGNOSIS — I951 Orthostatic hypotension: Secondary | ICD-10-CM | POA: Diagnosis not present

## 2023-04-17 ENCOUNTER — Telehealth: Payer: Self-pay | Admitting: Internal Medicine

## 2023-04-17 DIAGNOSIS — I951 Orthostatic hypotension: Secondary | ICD-10-CM | POA: Diagnosis not present

## 2023-04-17 DIAGNOSIS — G3183 Dementia with Lewy bodies: Secondary | ICD-10-CM | POA: Diagnosis not present

## 2023-04-17 DIAGNOSIS — Z79899 Other long term (current) drug therapy: Secondary | ICD-10-CM | POA: Diagnosis not present

## 2023-04-17 DIAGNOSIS — M80012D Age-related osteoporosis with current pathological fracture, left shoulder, subsequent encounter for fracture with routine healing: Secondary | ICD-10-CM | POA: Diagnosis not present

## 2023-04-17 DIAGNOSIS — E538 Deficiency of other specified B group vitamins: Secondary | ICD-10-CM | POA: Diagnosis not present

## 2023-04-17 DIAGNOSIS — F02811 Dementia in other diseases classified elsewhere, unspecified severity, with agitation: Secondary | ICD-10-CM | POA: Diagnosis not present

## 2023-04-17 DIAGNOSIS — F0283 Dementia in other diseases classified elsewhere, unspecified severity, with mood disturbance: Secondary | ICD-10-CM | POA: Diagnosis not present

## 2023-04-17 DIAGNOSIS — I700 Atherosclerosis of aorta: Secondary | ICD-10-CM | POA: Diagnosis not present

## 2023-04-17 DIAGNOSIS — E039 Hypothyroidism, unspecified: Secondary | ICD-10-CM | POA: Diagnosis not present

## 2023-04-17 NOTE — Telephone Encounter (Signed)
Home health ST 1x6 to address swallowing and cognitive communication deficit

## 2023-04-18 NOTE — Telephone Encounter (Signed)
Left detailed message on machine for [name] with verbal orders per Dr Ardyth Harps.

## 2023-04-19 DIAGNOSIS — F0283 Dementia in other diseases classified elsewhere, unspecified severity, with mood disturbance: Secondary | ICD-10-CM | POA: Diagnosis not present

## 2023-04-19 DIAGNOSIS — I700 Atherosclerosis of aorta: Secondary | ICD-10-CM | POA: Diagnosis not present

## 2023-04-19 DIAGNOSIS — E039 Hypothyroidism, unspecified: Secondary | ICD-10-CM | POA: Diagnosis not present

## 2023-04-19 DIAGNOSIS — F02811 Dementia in other diseases classified elsewhere, unspecified severity, with agitation: Secondary | ICD-10-CM | POA: Diagnosis not present

## 2023-04-19 DIAGNOSIS — I951 Orthostatic hypotension: Secondary | ICD-10-CM | POA: Diagnosis not present

## 2023-04-19 DIAGNOSIS — E538 Deficiency of other specified B group vitamins: Secondary | ICD-10-CM | POA: Diagnosis not present

## 2023-04-19 DIAGNOSIS — Z79899 Other long term (current) drug therapy: Secondary | ICD-10-CM | POA: Diagnosis not present

## 2023-04-19 DIAGNOSIS — M80012D Age-related osteoporosis with current pathological fracture, left shoulder, subsequent encounter for fracture with routine healing: Secondary | ICD-10-CM | POA: Diagnosis not present

## 2023-04-19 DIAGNOSIS — G3183 Dementia with Lewy bodies: Secondary | ICD-10-CM | POA: Diagnosis not present

## 2023-04-21 ENCOUNTER — Ambulatory Visit (INDEPENDENT_AMBULATORY_CARE_PROVIDER_SITE_OTHER): Payer: Medicare PPO

## 2023-04-21 DIAGNOSIS — F02811 Dementia in other diseases classified elsewhere, unspecified severity, with agitation: Secondary | ICD-10-CM | POA: Diagnosis not present

## 2023-04-21 DIAGNOSIS — E538 Deficiency of other specified B group vitamins: Secondary | ICD-10-CM

## 2023-04-21 DIAGNOSIS — E039 Hypothyroidism, unspecified: Secondary | ICD-10-CM | POA: Diagnosis not present

## 2023-04-21 DIAGNOSIS — F0283 Dementia in other diseases classified elsewhere, unspecified severity, with mood disturbance: Secondary | ICD-10-CM | POA: Diagnosis not present

## 2023-04-21 DIAGNOSIS — G3183 Dementia with Lewy bodies: Secondary | ICD-10-CM | POA: Diagnosis not present

## 2023-04-21 DIAGNOSIS — I700 Atherosclerosis of aorta: Secondary | ICD-10-CM | POA: Diagnosis not present

## 2023-04-21 DIAGNOSIS — I951 Orthostatic hypotension: Secondary | ICD-10-CM | POA: Diagnosis not present

## 2023-04-21 DIAGNOSIS — Z79899 Other long term (current) drug therapy: Secondary | ICD-10-CM | POA: Diagnosis not present

## 2023-04-21 DIAGNOSIS — M80012D Age-related osteoporosis with current pathological fracture, left shoulder, subsequent encounter for fracture with routine healing: Secondary | ICD-10-CM | POA: Diagnosis not present

## 2023-04-21 MED ORDER — CYANOCOBALAMIN 1000 MCG/ML IJ SOLN
1000.0000 ug | Freq: Once | INTRAMUSCULAR | Status: AC
  Administered 2023-04-21: 1000 ug via INTRAMUSCULAR

## 2023-04-21 NOTE — Progress Notes (Signed)
Per orders of Dr. Caryl Never, injection of Cyanocobalamin 1000 mcg given by Trudie Cervantes L Pesach Frisch. Patient tolerated injection well.

## 2023-04-22 ENCOUNTER — Other Ambulatory Visit: Payer: Self-pay | Admitting: Adult Health

## 2023-04-22 DIAGNOSIS — F411 Generalized anxiety disorder: Secondary | ICD-10-CM

## 2023-04-22 DIAGNOSIS — F331 Major depressive disorder, recurrent, moderate: Secondary | ICD-10-CM

## 2023-04-24 DIAGNOSIS — E039 Hypothyroidism, unspecified: Secondary | ICD-10-CM | POA: Diagnosis not present

## 2023-04-24 DIAGNOSIS — M80012D Age-related osteoporosis with current pathological fracture, left shoulder, subsequent encounter for fracture with routine healing: Secondary | ICD-10-CM | POA: Diagnosis not present

## 2023-04-24 DIAGNOSIS — F02811 Dementia in other diseases classified elsewhere, unspecified severity, with agitation: Secondary | ICD-10-CM | POA: Diagnosis not present

## 2023-04-24 DIAGNOSIS — E538 Deficiency of other specified B group vitamins: Secondary | ICD-10-CM | POA: Diagnosis not present

## 2023-04-24 DIAGNOSIS — F0283 Dementia in other diseases classified elsewhere, unspecified severity, with mood disturbance: Secondary | ICD-10-CM | POA: Diagnosis not present

## 2023-04-24 DIAGNOSIS — I700 Atherosclerosis of aorta: Secondary | ICD-10-CM | POA: Diagnosis not present

## 2023-04-24 DIAGNOSIS — I951 Orthostatic hypotension: Secondary | ICD-10-CM | POA: Diagnosis not present

## 2023-04-24 DIAGNOSIS — G3183 Dementia with Lewy bodies: Secondary | ICD-10-CM | POA: Diagnosis not present

## 2023-04-24 DIAGNOSIS — Z79899 Other long term (current) drug therapy: Secondary | ICD-10-CM | POA: Diagnosis not present

## 2023-05-01 DIAGNOSIS — F02811 Dementia in other diseases classified elsewhere, unspecified severity, with agitation: Secondary | ICD-10-CM | POA: Diagnosis not present

## 2023-05-01 DIAGNOSIS — E039 Hypothyroidism, unspecified: Secondary | ICD-10-CM | POA: Diagnosis not present

## 2023-05-01 DIAGNOSIS — Z79899 Other long term (current) drug therapy: Secondary | ICD-10-CM | POA: Diagnosis not present

## 2023-05-01 DIAGNOSIS — E538 Deficiency of other specified B group vitamins: Secondary | ICD-10-CM | POA: Diagnosis not present

## 2023-05-01 DIAGNOSIS — G3183 Dementia with Lewy bodies: Secondary | ICD-10-CM | POA: Diagnosis not present

## 2023-05-01 DIAGNOSIS — I700 Atherosclerosis of aorta: Secondary | ICD-10-CM | POA: Diagnosis not present

## 2023-05-01 DIAGNOSIS — F0283 Dementia in other diseases classified elsewhere, unspecified severity, with mood disturbance: Secondary | ICD-10-CM | POA: Diagnosis not present

## 2023-05-01 DIAGNOSIS — M80012D Age-related osteoporosis with current pathological fracture, left shoulder, subsequent encounter for fracture with routine healing: Secondary | ICD-10-CM | POA: Diagnosis not present

## 2023-05-01 DIAGNOSIS — I951 Orthostatic hypotension: Secondary | ICD-10-CM | POA: Diagnosis not present

## 2023-05-01 DIAGNOSIS — M25512 Pain in left shoulder: Secondary | ICD-10-CM | POA: Diagnosis not present

## 2023-05-03 DIAGNOSIS — I700 Atherosclerosis of aorta: Secondary | ICD-10-CM | POA: Diagnosis not present

## 2023-05-03 DIAGNOSIS — M80012D Age-related osteoporosis with current pathological fracture, left shoulder, subsequent encounter for fracture with routine healing: Secondary | ICD-10-CM | POA: Diagnosis not present

## 2023-05-03 DIAGNOSIS — E039 Hypothyroidism, unspecified: Secondary | ICD-10-CM | POA: Diagnosis not present

## 2023-05-03 DIAGNOSIS — G3183 Dementia with Lewy bodies: Secondary | ICD-10-CM | POA: Diagnosis not present

## 2023-05-03 DIAGNOSIS — I951 Orthostatic hypotension: Secondary | ICD-10-CM | POA: Diagnosis not present

## 2023-05-03 DIAGNOSIS — F0283 Dementia in other diseases classified elsewhere, unspecified severity, with mood disturbance: Secondary | ICD-10-CM | POA: Diagnosis not present

## 2023-05-03 DIAGNOSIS — E538 Deficiency of other specified B group vitamins: Secondary | ICD-10-CM | POA: Diagnosis not present

## 2023-05-03 DIAGNOSIS — F02811 Dementia in other diseases classified elsewhere, unspecified severity, with agitation: Secondary | ICD-10-CM | POA: Diagnosis not present

## 2023-05-03 DIAGNOSIS — Z79899 Other long term (current) drug therapy: Secondary | ICD-10-CM | POA: Diagnosis not present

## 2023-05-04 DIAGNOSIS — F02B3 Dementia in other diseases classified elsewhere, moderate, with mood disturbance: Secondary | ICD-10-CM | POA: Diagnosis not present

## 2023-05-04 DIAGNOSIS — R441 Visual hallucinations: Secondary | ICD-10-CM | POA: Diagnosis not present

## 2023-05-04 DIAGNOSIS — G20B2 Parkinson's disease with dyskinesia, with fluctuations: Secondary | ICD-10-CM | POA: Diagnosis not present

## 2023-05-05 DIAGNOSIS — E538 Deficiency of other specified B group vitamins: Secondary | ICD-10-CM | POA: Diagnosis not present

## 2023-05-05 DIAGNOSIS — Z79899 Other long term (current) drug therapy: Secondary | ICD-10-CM | POA: Diagnosis not present

## 2023-05-05 DIAGNOSIS — G3183 Dementia with Lewy bodies: Secondary | ICD-10-CM | POA: Diagnosis not present

## 2023-05-05 DIAGNOSIS — F02811 Dementia in other diseases classified elsewhere, unspecified severity, with agitation: Secondary | ICD-10-CM | POA: Diagnosis not present

## 2023-05-05 DIAGNOSIS — I700 Atherosclerosis of aorta: Secondary | ICD-10-CM | POA: Diagnosis not present

## 2023-05-05 DIAGNOSIS — F0283 Dementia in other diseases classified elsewhere, unspecified severity, with mood disturbance: Secondary | ICD-10-CM | POA: Diagnosis not present

## 2023-05-05 DIAGNOSIS — I951 Orthostatic hypotension: Secondary | ICD-10-CM | POA: Diagnosis not present

## 2023-05-05 DIAGNOSIS — E039 Hypothyroidism, unspecified: Secondary | ICD-10-CM | POA: Diagnosis not present

## 2023-05-05 DIAGNOSIS — M80012D Age-related osteoporosis with current pathological fracture, left shoulder, subsequent encounter for fracture with routine healing: Secondary | ICD-10-CM | POA: Diagnosis not present

## 2023-05-08 DIAGNOSIS — M25612 Stiffness of left shoulder, not elsewhere classified: Secondary | ICD-10-CM | POA: Diagnosis not present

## 2023-05-08 DIAGNOSIS — S42032D Displaced fracture of lateral end of left clavicle, subsequent encounter for fracture with routine healing: Secondary | ICD-10-CM | POA: Diagnosis not present

## 2023-05-08 DIAGNOSIS — M6281 Muscle weakness (generalized): Secondary | ICD-10-CM | POA: Diagnosis not present

## 2023-05-10 ENCOUNTER — Ambulatory Visit: Payer: Medicare PPO | Admitting: Adult Health

## 2023-05-10 ENCOUNTER — Encounter: Payer: Self-pay | Admitting: Adult Health

## 2023-05-10 DIAGNOSIS — G4700 Insomnia, unspecified: Secondary | ICD-10-CM

## 2023-05-10 DIAGNOSIS — F331 Major depressive disorder, recurrent, moderate: Secondary | ICD-10-CM

## 2023-05-10 DIAGNOSIS — F410 Panic disorder [episodic paroxysmal anxiety] without agoraphobia: Secondary | ICD-10-CM

## 2023-05-10 DIAGNOSIS — F411 Generalized anxiety disorder: Secondary | ICD-10-CM | POA: Diagnosis not present

## 2023-05-10 MED ORDER — SERTRALINE HCL 100 MG PO TABS: ORAL_TABLET | ORAL | 3 refills | Status: DC

## 2023-05-10 MED ORDER — TRAZODONE HCL 50 MG PO TABS: ORAL_TABLET | ORAL | 3 refills | Status: DC

## 2023-05-10 NOTE — Progress Notes (Signed)
Kelly Howard [id number] [birth year] 72 y.o.  Subjective:   Patient ID:  Kelly Howard is a 72 y.o. (DOB 10/29/1950) female.  Chief Complaint: No chief complaint on file.   HPI Kelly Howard presents to the office today for follow-up of GAD, MDD, panic attacks, and insomnia.  Diagnosed with Parkinson's in 2010 - followed by [facility].   Accompanied by husband.  Describes mood today as "not too good". Tearful at times. Pleasant. Mood symptoms - reports depression, anxiety and irritability. Reports panic attacks. Reports some worry, over thinking and rumination. Mood is   Stating "I don't feel like I'm doing ok right now". Feels like medications are helpful. Husband supportive. Has a caregiver 25 hours a week. Varying interest and motivation. Recent physical with PCP. Taking medications as prescribed. Energy levels lower. Active, has a regular exercise routine. Enjoys some usual interests and activities. Married. Lives with husband. Has 2 daughters - 3 grandchildren. Spending time with family. Appetite adequate. Weight loss 110 from 123 pounds. Sleeps better some nights than others. Averages 5 to 6 hours. Denies daytime napping. Focus and concentration difficulties - memory and some confusion. Completing tasks. Managing some aspects of household. Retired. Denies SI or HI.  Decreased AH. Denies AH. Reports VH.   Previous medication trials: Clonazepam, Zoloft, Paxil, Celexa   GAD-7    Flowsheet Row Office Visit from 09/14/2022 in [facility] [place] [facility] [place]  Total GAD-7 Score 6      PHQ2-9    Flowsheet Row Office Visit from 03/22/2023 in [facility] [place] Office Visit from 09[facility]14/2022 in [facility] [place] [facility] [place] Office Visit from 08/10/2021 in [facility] [place] [facility] [place] Office Visit from 04/19/2021 in [facility] [place] [facility] [place] [facility]  from 02/12/2021 in [facility] [place]  PHQ-2 Total Score 1 1 3 4 1   PHQ-9 Total Score 6 7 9 19  --      Flowsheet Row ED to Hosp-Admission (Discharged) from 03/26/2023 in [facility] ED from 03/21/2023 in [facility] Emergency Department at [facility] ED from 05/21/2022 in [facility] Department at [facility]  C-SSRS RISK CATEGORY No Risk No Risk No Risk        Review of Systems:  Review of Systems  Musculoskeletal:  Negative for gait problem.  Neurological:  Negative for tremors.  Psychiatric/Behavioral:         Please refer to HPI    Medications: I have reviewed the patient's current medications.  Current Outpatient Medications  Medication Sig Dispense Refill   traZODone (DESYREL) 50 MG tablet TAKE 1 TO 2 TABLETS BY MOUTH EVERY NIGHT. 180 tablet 1   acetaminophen (TYLENOL) 325 MG tablet Take 325-650 mg by mouth as needed for mild pain (pain score 1-3) or moderate pain (pain score 4-6).     AMBULATORY NON FORMULARY MEDICATION Abdominal compression binder Dx: G20 1 Device 0   Carbidopa-Levodopa ER (RYTARY) 48.75-195 MG CPCR Take 2 tablets by mouth See admin instructions. 2 tabs 6am, 3 tabs at noon, 3 tabs at 6pm, 2 tabs at 10pm.     cyanocobalamin (VITAMIN B12) 1000 MCG/ML injection Inject into the muscle every 30 (thirty) days.     denosumab (PROLIA) 60 MG/ML SOSY injection Inject 60 mg into the skin every 6 (six) months.     levothyroxine (SYNTHROID, LEVOTHROID) 50 MCG tablet Take 50 mcg by mouth daily  before breakfast.     loratadine (CLARITIN) 10 MG tablet Take 10 mg by mouth at bedtime.     NUPLAZID 34 MG CAPS Take 1 capsule by mouth daily.     rivastigmine (EXELON) 9.5 mg/24hr Place 9.5 mg onto the skin daily.     sertraline (ZOLOFT) 100 MG tablet TAKE TWO TABLETS DAILY. (Patient taking differently: Take 100 mg by mouth daily.) 180 tablet 3   Vibegron 75 MG TABS Take 75 mg by mouth at  bedtime.     VITAMIN D PO Take 1 capsule by mouth daily.     No current facility-administered medications for this visit.    Medication Side Effects: None  Allergies:  Allergies  Allergen Reactions   Codeine Other (See Comments)    GI Upset   Escitalopram Swelling and Other (See Comments)    Feet swell   Propoxyphene Other (See Comments)    Hallucinations    Past Medical History:  Diagnosis Date   Frequent falls    Hypothyroidism    Osteoporosis    Parkinson's disease (HCC)     Past Medical History, Surgical history, Social history, and Family history were reviewed and updated as appropriate.   Please see review of systems for further details on the patient's review from today.   Objective:   Physical Exam:  LMP  (LMP Unknown) Comment: gyn  Physical Exam  Lab Review:     Component Value Date/Time   NA 138 03/26/2023 1620   K 3.5 03/26/2023 1620   CL 100 03/26/2023 1620   CO2 32 03/26/2023 1620   GLUCOSE 96 03/26/2023 1620   BUN 19 03/26/2023 1620   CREATININE 0.56 03/26/2023 1620   CALCIUM 9.5 03/26/2023 1620   PROT 6.8 03/26/2023 1620   ALBUMIN 4.6 03/26/2023 1620   AST 12 (L) 03/26/2023 1620   ALT <5 03/26/2023 1620   ALKPHOS 54 03/26/2023 1620   BILITOT 0.6 03/26/2023 1620   GFRNONAA >60 03/26/2023 1620       Component Value Date/Time   WBC 6.2 03/26/2023 1620   RBC 3.84 (L) 03/26/2023 1620   HGB 10.9 (L) 03/26/2023 1620   HCT 33.9 (L) 03/26/2023 1620   PLT 310 03/26/2023 1620   MCV 88.3 03/26/2023 1620   MCH 28.4 03/26/2023 1620   MCHC 32.2 03/26/2023 1620   RDW 13.4 03/26/2023 1620   LYMPHSABS 0.8 09/14/2022 0931   MONOABS 0.6 09/14/2022 0931   EOSABS 0.1 09/14/2022 0931   BASOSABS 0.0 09/14/2022 0931    No results found for: "POCLITH", "LITHIUM"   No results found for: "PHENYTOIN", "PHENOBARB", "VALPROATE", "CBMZ"   .res Assessment: Plan:    Plan:  PDMP reviewed  Zoloft 100mg  daily Trazadone 50mg  - take 1 to 2 tablets at hs    Also taking Nuplazid, 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 Greater than 50% of face to face time with patient was spent on counseling and coordination of care.    Patient advised to contact office with any questions, adverse effects, or acute worsening in signs and symptoms.  Discussed potential benefits, risk, and side effects of benzodiazepines to include potential risk of tolerance and dependence, as well as possible drowsiness.  Advised patient not to drive if experiencing drowsiness and to take lowest possible effective dose to minimize risk of dependence and tolerance.  There are no diagnoses linked to this encounter.   Please see After Visit Summary for  patient specific instructions.  Future Appointments  Date Time Provider Department Center  05/15/2023  2:00 PM [facility]  09/18/2023 10:00 AM Philip Aspen, Limmie Patricia, MD [facility]    No orders of the defined types were placed in this encounter.   -------------------------------

## 2023-05-11 DIAGNOSIS — R350 Frequency of micturition: Secondary | ICD-10-CM | POA: Diagnosis not present

## 2023-05-15 ENCOUNTER — Ambulatory Visit (INDEPENDENT_AMBULATORY_CARE_PROVIDER_SITE_OTHER): Payer: Medicare PPO

## 2023-05-15 DIAGNOSIS — E538 Deficiency of other specified B group vitamins: Secondary | ICD-10-CM

## 2023-05-15 MED ORDER — CYANOCOBALAMIN 1000 MCG/ML IJ SOLN
1000.0000 ug | Freq: Once | INTRAMUSCULAR | Status: AC
  Administered 2023-05-15: 1000 ug via INTRAMUSCULAR

## 2023-05-15 NOTE — Progress Notes (Signed)
Per orders of Dr. Ardyth Harps, injection of B12 given by Vickii Chafe on Right Deltoid. Patient tolerated injection well.

## 2023-05-17 DIAGNOSIS — M25612 Stiffness of left shoulder, not elsewhere classified: Secondary | ICD-10-CM | POA: Diagnosis not present

## 2023-05-17 DIAGNOSIS — M6281 Muscle weakness (generalized): Secondary | ICD-10-CM | POA: Diagnosis not present

## 2023-05-17 DIAGNOSIS — S42032D Displaced fracture of lateral end of left clavicle, subsequent encounter for fracture with routine healing: Secondary | ICD-10-CM | POA: Diagnosis not present

## 2023-05-19 ENCOUNTER — Telehealth: Payer: Self-pay | Admitting: Internal Medicine

## 2023-05-19 NOTE — Telephone Encounter (Signed)
Kelly Howard from home health care wanted to know if a fax was received on dec 5th and was it filled out she would like a call back

## 2023-05-22 NOTE — Telephone Encounter (Signed)
Form was re faxed and confirmed and Kelly Howard is aware.

## 2023-05-26 DIAGNOSIS — S42032D Displaced fracture of lateral end of left clavicle, subsequent encounter for fracture with routine healing: Secondary | ICD-10-CM | POA: Diagnosis not present

## 2023-05-26 DIAGNOSIS — M6281 Muscle weakness (generalized): Secondary | ICD-10-CM | POA: Diagnosis not present

## 2023-05-26 DIAGNOSIS — M25612 Stiffness of left shoulder, not elsewhere classified: Secondary | ICD-10-CM | POA: Diagnosis not present

## 2023-05-30 DIAGNOSIS — N390 Urinary tract infection, site not specified: Secondary | ICD-10-CM | POA: Diagnosis not present

## 2023-06-08 DIAGNOSIS — R296 Repeated falls: Secondary | ICD-10-CM | POA: Diagnosis not present

## 2023-06-08 DIAGNOSIS — F419 Anxiety disorder, unspecified: Secondary | ICD-10-CM | POA: Diagnosis not present

## 2023-06-08 DIAGNOSIS — G20B2 Parkinson's disease with dyskinesia, with fluctuations: Secondary | ICD-10-CM | POA: Diagnosis not present

## 2023-06-08 DIAGNOSIS — R443 Hallucinations, unspecified: Secondary | ICD-10-CM | POA: Diagnosis not present

## 2023-06-08 DIAGNOSIS — Z79899 Other long term (current) drug therapy: Secondary | ICD-10-CM | POA: Diagnosis not present

## 2023-06-12 DIAGNOSIS — M25552 Pain in left hip: Secondary | ICD-10-CM | POA: Diagnosis not present

## 2023-06-12 DIAGNOSIS — M25511 Pain in right shoulder: Secondary | ICD-10-CM | POA: Diagnosis not present

## 2023-06-12 DIAGNOSIS — S42002A Fracture of unspecified part of left clavicle, initial encounter for closed fracture: Secondary | ICD-10-CM | POA: Diagnosis not present

## 2023-06-14 ENCOUNTER — Other Ambulatory Visit: Payer: Self-pay | Admitting: Adult Health

## 2023-06-14 DIAGNOSIS — F411 Generalized anxiety disorder: Secondary | ICD-10-CM

## 2023-06-14 DIAGNOSIS — F331 Major depressive disorder, recurrent, moderate: Secondary | ICD-10-CM

## 2023-06-14 DIAGNOSIS — F410 Panic disorder [episodic paroxysmal anxiety] without agoraphobia: Secondary | ICD-10-CM

## 2023-06-14 NOTE — Telephone Encounter (Signed)
   Disp Refills Start End   sertraline  (ZOLOFT ) 100 MG tablet 90 tablet 3 05/10/2023 --   Sig: TAKE ONE TABLET DAILY.   Sent to pharmacy as: sertraline  (ZOLOFT ) 100 MG tablet   E-Prescribing Status: Receipt confirmed by pharmacy (05/10/2023  2:21 PM EST)

## 2023-06-15 ENCOUNTER — Ambulatory Visit: Payer: Medicare PPO | Admitting: *Deleted

## 2023-06-15 VITALS — Wt 103.0 lb

## 2023-06-15 DIAGNOSIS — E538 Deficiency of other specified B group vitamins: Secondary | ICD-10-CM

## 2023-06-15 MED ORDER — CYANOCOBALAMIN 1000 MCG/ML IJ SOLN
1000.0000 ug | Freq: Once | INTRAMUSCULAR | Status: AC
  Administered 2023-06-15: 1000 ug via INTRAMUSCULAR

## 2023-06-15 NOTE — Progress Notes (Signed)
Per orders of Dr. Hernandez, injection of B12 given by Dailyn Reith. Patient tolerated injection well.  

## 2023-06-22 DIAGNOSIS — R350 Frequency of micturition: Secondary | ICD-10-CM | POA: Diagnosis not present

## 2023-07-03 DIAGNOSIS — M542 Cervicalgia: Secondary | ICD-10-CM | POA: Diagnosis not present

## 2023-07-17 ENCOUNTER — Ambulatory Visit (INDEPENDENT_AMBULATORY_CARE_PROVIDER_SITE_OTHER): Payer: Medicare PPO

## 2023-07-17 VITALS — BP 96/62 | Temp 98.1°F | Wt 109.1 lb

## 2023-07-17 DIAGNOSIS — E538 Deficiency of other specified B group vitamins: Secondary | ICD-10-CM

## 2023-07-17 MED ORDER — CYANOCOBALAMIN 1000 MCG/ML IJ SOLN
1000.0000 ug | Freq: Once | INTRAMUSCULAR | Status: AC
  Administered 2023-07-17: 1000 ug via INTRAMUSCULAR

## 2023-07-17 NOTE — Progress Notes (Signed)
 Per orders of Dr. Caryl Never, injection of Cyanocobalamin 1000 mcg given by Vickii Chafe on Left Deltoid. Patient tolerated injection well.

## 2023-07-20 DIAGNOSIS — R350 Frequency of micturition: Secondary | ICD-10-CM | POA: Diagnosis not present

## 2023-08-03 DIAGNOSIS — G20B2 Parkinson's disease with dyskinesia, with fluctuations: Secondary | ICD-10-CM | POA: Diagnosis not present

## 2023-08-03 DIAGNOSIS — K117 Disturbances of salivary secretion: Secondary | ICD-10-CM | POA: Diagnosis not present

## 2023-08-14 ENCOUNTER — Ambulatory Visit (INDEPENDENT_AMBULATORY_CARE_PROVIDER_SITE_OTHER): Payer: Medicare PPO | Admitting: *Deleted

## 2023-08-14 DIAGNOSIS — E538 Deficiency of other specified B group vitamins: Secondary | ICD-10-CM | POA: Diagnosis not present

## 2023-08-14 MED ORDER — CYANOCOBALAMIN 1000 MCG/ML IJ SOLN
1000.0000 ug | Freq: Once | INTRAMUSCULAR | Status: AC
  Administered 2023-08-14: 1000 ug via INTRAMUSCULAR

## 2023-08-14 NOTE — Progress Notes (Signed)
 Per orders of Dr. Ardyth Harps, injection of B12 given by Kern Reap. Patient tolerated injection well.

## 2023-08-16 DIAGNOSIS — R1310 Dysphagia, unspecified: Secondary | ICD-10-CM | POA: Diagnosis not present

## 2023-08-16 DIAGNOSIS — J309 Allergic rhinitis, unspecified: Secondary | ICD-10-CM | POA: Diagnosis not present

## 2023-08-16 DIAGNOSIS — J301 Allergic rhinitis due to pollen: Secondary | ICD-10-CM | POA: Diagnosis not present

## 2023-08-16 DIAGNOSIS — R682 Dry mouth, unspecified: Secondary | ICD-10-CM | POA: Diagnosis not present

## 2023-08-17 DIAGNOSIS — R350 Frequency of micturition: Secondary | ICD-10-CM | POA: Diagnosis not present

## 2023-09-18 ENCOUNTER — Encounter: Payer: Medicare PPO | Admitting: Internal Medicine

## 2023-09-19 DIAGNOSIS — R350 Frequency of micturition: Secondary | ICD-10-CM | POA: Diagnosis not present

## 2023-09-20 DIAGNOSIS — K117 Disturbances of salivary secretion: Secondary | ICD-10-CM | POA: Diagnosis not present

## 2023-09-20 DIAGNOSIS — F419 Anxiety disorder, unspecified: Secondary | ICD-10-CM | POA: Diagnosis not present

## 2023-09-20 DIAGNOSIS — R441 Visual hallucinations: Secondary | ICD-10-CM | POA: Diagnosis not present

## 2023-09-20 DIAGNOSIS — G20B2 Parkinson's disease with dyskinesia, with fluctuations: Secondary | ICD-10-CM | POA: Diagnosis not present

## 2023-09-22 ENCOUNTER — Inpatient Hospital Stay (HOSPITAL_COMMUNITY)
Admission: EM | Admit: 2023-09-22 | Discharge: 2023-09-27 | DRG: 535 | Disposition: A | Discharge status: Skilled Nursing Facility | Attending: Internal Medicine | Admitting: Internal Medicine

## 2023-09-22 ENCOUNTER — Other Ambulatory Visit: Payer: Self-pay

## 2023-09-22 ENCOUNTER — Emergency Department (HOSPITAL_COMMUNITY)

## 2023-09-22 ENCOUNTER — Encounter (HOSPITAL_COMMUNITY): Payer: Self-pay

## 2023-09-22 DIAGNOSIS — R413 Other amnesia: Secondary | ICD-10-CM | POA: Diagnosis present

## 2023-09-22 DIAGNOSIS — R32 Unspecified urinary incontinence: Secondary | ICD-10-CM | POA: Diagnosis present

## 2023-09-22 DIAGNOSIS — Z79890 Hormone replacement therapy (postmenopausal): Secondary | ICD-10-CM | POA: Diagnosis not present

## 2023-09-22 DIAGNOSIS — F02818 Dementia in other diseases classified elsewhere, unspecified severity, with other behavioral disturbance: Secondary | ICD-10-CM | POA: Diagnosis present

## 2023-09-22 DIAGNOSIS — G3183 Dementia with Lewy bodies: Secondary | ICD-10-CM | POA: Diagnosis not present

## 2023-09-22 DIAGNOSIS — W010XXA Fall on same level from slipping, tripping and stumbling without subsequent striking against object, initial encounter: Secondary | ICD-10-CM | POA: Diagnosis present

## 2023-09-22 DIAGNOSIS — N3289 Other specified disorders of bladder: Secondary | ICD-10-CM | POA: Diagnosis not present

## 2023-09-22 DIAGNOSIS — I491 Atrial premature depolarization: Secondary | ICD-10-CM | POA: Diagnosis not present

## 2023-09-22 DIAGNOSIS — F411 Generalized anxiety disorder: Secondary | ICD-10-CM

## 2023-09-22 DIAGNOSIS — M80052D Age-related osteoporosis with current pathological fracture, left femur, subsequent encounter for fracture with routine healing: Secondary | ICD-10-CM | POA: Diagnosis not present

## 2023-09-22 DIAGNOSIS — R001 Bradycardia, unspecified: Secondary | ICD-10-CM | POA: Diagnosis not present

## 2023-09-22 DIAGNOSIS — G9341 Metabolic encephalopathy: Secondary | ICD-10-CM | POA: Diagnosis not present

## 2023-09-22 DIAGNOSIS — G4700 Insomnia, unspecified: Secondary | ICD-10-CM

## 2023-09-22 DIAGNOSIS — G20A1 Parkinson's disease without dyskinesia, without mention of fluctuations: Secondary | ICD-10-CM | POA: Diagnosis present

## 2023-09-22 DIAGNOSIS — W19XXXA Unspecified fall, initial encounter: Principal | ICD-10-CM

## 2023-09-22 DIAGNOSIS — Z79620 Long term (current) use of immunosuppressive biologic: Secondary | ICD-10-CM

## 2023-09-22 DIAGNOSIS — G903 Multi-system degeneration of the autonomic nervous system: Secondary | ICD-10-CM | POA: Diagnosis not present

## 2023-09-22 DIAGNOSIS — I700 Atherosclerosis of aorta: Secondary | ICD-10-CM | POA: Diagnosis not present

## 2023-09-22 DIAGNOSIS — R9431 Abnormal electrocardiogram [ECG] [EKG]: Secondary | ICD-10-CM | POA: Diagnosis not present

## 2023-09-22 DIAGNOSIS — M8580 Other specified disorders of bone density and structure, unspecified site: Secondary | ICD-10-CM | POA: Diagnosis not present

## 2023-09-22 DIAGNOSIS — S32392A Other fracture of left ilium, initial encounter for closed fracture: Secondary | ICD-10-CM | POA: Diagnosis not present

## 2023-09-22 DIAGNOSIS — R296 Repeated falls: Secondary | ICD-10-CM

## 2023-09-22 DIAGNOSIS — G20B2 Parkinson's disease with dyskinesia, with fluctuations: Secondary | ICD-10-CM

## 2023-09-22 DIAGNOSIS — E039 Hypothyroidism, unspecified: Secondary | ICD-10-CM | POA: Diagnosis present

## 2023-09-22 DIAGNOSIS — F0284 Dementia in other diseases classified elsewhere, unspecified severity, with anxiety: Secondary | ICD-10-CM | POA: Diagnosis not present

## 2023-09-22 DIAGNOSIS — Y92009 Unspecified place in unspecified non-institutional (private) residence as the place of occurrence of the external cause: Secondary | ICD-10-CM

## 2023-09-22 DIAGNOSIS — M1612 Unilateral primary osteoarthritis, left hip: Secondary | ICD-10-CM | POA: Diagnosis present

## 2023-09-22 DIAGNOSIS — S32302A Unspecified fracture of left ilium, initial encounter for closed fracture: Secondary | ICD-10-CM | POA: Diagnosis not present

## 2023-09-22 DIAGNOSIS — S32315A Nondisplaced avulsion fracture of left ilium, initial encounter for closed fracture: Secondary | ICD-10-CM

## 2023-09-22 DIAGNOSIS — Z7401 Bed confinement status: Secondary | ICD-10-CM | POA: Diagnosis not present

## 2023-09-22 DIAGNOSIS — F331 Major depressive disorder, recurrent, moderate: Secondary | ICD-10-CM

## 2023-09-22 DIAGNOSIS — F410 Panic disorder [episodic paroxysmal anxiety] without agoraphobia: Secondary | ICD-10-CM

## 2023-09-22 DIAGNOSIS — M25552 Pain in left hip: Secondary | ICD-10-CM | POA: Diagnosis not present

## 2023-09-22 DIAGNOSIS — Z79899 Other long term (current) drug therapy: Secondary | ICD-10-CM | POA: Diagnosis not present

## 2023-09-22 DIAGNOSIS — M810 Age-related osteoporosis without current pathological fracture: Secondary | ICD-10-CM | POA: Diagnosis not present

## 2023-09-22 DIAGNOSIS — I959 Hypotension, unspecified: Secondary | ICD-10-CM | POA: Diagnosis not present

## 2023-09-22 DIAGNOSIS — K5730 Diverticulosis of large intestine without perforation or abscess without bleeding: Secondary | ICD-10-CM | POA: Diagnosis not present

## 2023-09-22 DIAGNOSIS — K5792 Diverticulitis of intestine, part unspecified, without perforation or abscess without bleeding: Secondary | ICD-10-CM | POA: Diagnosis not present

## 2023-09-22 DIAGNOSIS — I2510 Atherosclerotic heart disease of native coronary artery without angina pectoris: Secondary | ICD-10-CM | POA: Diagnosis not present

## 2023-09-22 DIAGNOSIS — W19XXXD Unspecified fall, subsequent encounter: Secondary | ICD-10-CM | POA: Diagnosis not present

## 2023-09-22 DIAGNOSIS — F0283 Dementia in other diseases classified elsewhere, unspecified severity, with mood disturbance: Secondary | ICD-10-CM | POA: Diagnosis not present

## 2023-09-22 DIAGNOSIS — M160 Bilateral primary osteoarthritis of hip: Secondary | ICD-10-CM | POA: Diagnosis not present

## 2023-09-22 LAB — CBC WITH DIFFERENTIAL/PLATELET
Abs Immature Granulocytes: 0.06 10*3/uL (ref 0.00–0.07)
Basophils Absolute: 0 10*3/uL (ref 0.0–0.1)
Basophils Relative: 0 %
Eosinophils Absolute: 0.1 10*3/uL (ref 0.0–0.5)
Eosinophils Relative: 1 %
HCT: 34.7 % — ABNORMAL LOW (ref 36.0–46.0)
Hemoglobin: 11 g/dL — ABNORMAL LOW (ref 12.0–15.0)
Immature Granulocytes: 1 %
Lymphocytes Relative: 10 %
Lymphs Abs: 0.7 10*3/uL (ref 0.7–4.0)
MCH: 28.9 pg (ref 26.0–34.0)
MCHC: 31.7 g/dL (ref 30.0–36.0)
MCV: 91.3 fL (ref 80.0–100.0)
Monocytes Absolute: 0.6 10*3/uL (ref 0.1–1.0)
Monocytes Relative: 9 %
Neutro Abs: 5.4 10*3/uL (ref 1.7–7.7)
Neutrophils Relative %: 79 %
Platelets: 274 10*3/uL (ref 150–400)
RBC: 3.8 MIL/uL — ABNORMAL LOW (ref 3.87–5.11)
RDW: 13.3 % (ref 11.5–15.5)
WBC: 6.9 10*3/uL (ref 4.0–10.5)
nRBC: 0 % (ref 0.0–0.2)

## 2023-09-22 LAB — BASIC METABOLIC PANEL WITH GFR
Anion gap: 7 (ref 5–15)
BUN: 12 mg/dL (ref 8–23)
CO2: 27 mmol/L (ref 22–32)
Calcium: 9 mg/dL (ref 8.9–10.3)
Chloride: 105 mmol/L (ref 98–111)
Creatinine, Ser: 0.62 mg/dL (ref 0.44–1.00)
GFR, Estimated: >60 mL/min (ref >60)
Glucose, Bld: 88 mg/dL (ref 70–99)
Potassium: 4.1 mmol/L (ref 3.5–5.1)
Sodium: 139 mmol/L (ref 135–145)

## 2023-09-22 LAB — TYPE AND SCREEN
ABO/RH(D): O POS
Antibody Screen: NEGATIVE

## 2023-09-22 MED ORDER — RIVASTIGMINE 9.5 MG/24HR TD PT24
9.5000 mg | MEDICATED_PATCH | Freq: Every day | TRANSDERMAL | Status: DC
  Administered 2023-09-23 – 2023-09-27 (×5): 9.5 mg via TRANSDERMAL
  Filled 2023-09-22 (×5): qty 1

## 2023-09-22 MED ORDER — MIRABEGRON ER 25 MG PO TB24
25.0000 mg | ORAL_TABLET | Freq: Every day | ORAL | Status: DC
  Administered 2023-09-23 – 2023-09-27 (×5): 25 mg via ORAL
  Filled 2023-09-22 (×6): qty 1

## 2023-09-22 MED ORDER — CARBIDOPA-LEVODOPA ER 48.75-195 MG PO CPCR: 2.0000 | ORAL_CAPSULE | Freq: Two times a day (BID) | ORAL | Status: DC

## 2023-09-22 MED ORDER — HYDROMORPHONE HCL 1 MG/ML IJ SOLN
1.0000 mg | Freq: Once | INTRAMUSCULAR | Status: AC
  Administered 2023-09-22: 1 mg via INTRAVENOUS
  Filled 2023-09-22: qty 1

## 2023-09-22 MED ORDER — TRAZODONE HCL 50 MG PO TABS
25.0000 mg | ORAL_TABLET | Freq: Once | ORAL | Status: AC
  Administered 2023-09-22: 25 mg via ORAL
  Filled 2023-09-22: qty 1

## 2023-09-22 MED ORDER — CARBIDOPA-LEVODOPA ER 48.75-195 MG PO CPCR: 3.0000 | ORAL_CAPSULE | Freq: Two times a day (BID) | ORAL | Status: DC

## 2023-09-22 MED ORDER — LEVOTHYROXINE SODIUM 50 MCG PO TABS
50.0000 ug | ORAL_TABLET | Freq: Every day | ORAL | Status: DC
  Administered 2023-09-23 – 2023-09-27 (×5): 50 ug via ORAL
  Filled 2023-09-22 (×6): qty 1

## 2023-09-22 MED ORDER — MORPHINE SULFATE (PF) 4 MG/ML IV SOLN
4.0000 mg | Freq: Once | INTRAVENOUS | Status: AC
  Administered 2023-09-22: 4 mg via INTRAVENOUS
  Filled 2023-09-22: qty 1

## 2023-09-22 MED ORDER — SERTRALINE HCL 50 MG PO TABS
50.0000 mg | ORAL_TABLET | Freq: Every day | ORAL | Status: DC
  Administered 2023-09-23 – 2023-09-26 (×4): 50 mg via ORAL
  Filled 2023-09-22 (×4): qty 1

## 2023-09-22 MED ORDER — CARBIDOPA-LEVODOPA ER 48.75-195 MG PO CPCR: 2.0000 | ORAL_CAPSULE | ORAL | Status: DC

## 2023-09-22 MED ORDER — TRAZODONE HCL 50 MG PO TABS: 100.0000 mg | ORAL_TABLET | Freq: Every day | ORAL | Status: DC

## 2023-09-22 MED ORDER — POLYETHYLENE GLYCOL 3350 17 G PO PACK
17.0000 g | PACK | Freq: Every day | ORAL | Status: DC | PRN
  Administered 2023-09-27: 17 g via ORAL
  Filled 2023-09-22: qty 1

## 2023-09-22 MED ORDER — HYDROCODONE-ACETAMINOPHEN 5-325 MG PO TABS
1.0000 | ORAL_TABLET | Freq: Four times a day (QID) | ORAL | Status: DC | PRN
  Administered 2023-09-23 – 2023-09-27 (×10): 1 via ORAL
  Filled 2023-09-22 (×11): qty 1

## 2023-09-22 MED ORDER — HYDROMORPHONE HCL 1 MG/ML IJ SOLN
0.5000 mg | INTRAMUSCULAR | Status: DC | PRN
  Administered 2023-09-22 – 2023-09-24 (×2): 0.5 mg via INTRAVENOUS
  Filled 2023-09-22 (×3): qty 0.5

## 2023-09-22 MED ORDER — TRAZODONE HCL 50 MG PO TABS
50.0000 mg | ORAL_TABLET | Freq: Every day | ORAL | Status: DC
  Administered 2023-09-23 – 2023-09-26 (×4): 50 mg via ORAL
  Filled 2023-09-22 (×5): qty 1

## 2023-09-22 MED ORDER — ONDANSETRON HCL 4 MG/2ML IJ SOLN
4.0000 mg | Freq: Once | INTRAMUSCULAR | Status: AC
  Administered 2023-09-22: 4 mg via INTRAVENOUS
  Filled 2023-09-22: qty 2

## 2023-09-22 MED ORDER — PIMAVANSERIN TARTRATE 34 MG PO CAPS
1.0000 | ORAL_CAPSULE | Freq: Every day | ORAL | Status: DC
  Administered 2023-09-24 – 2023-09-27 (×4): 34 mg via ORAL
  Filled 2023-09-22 (×4): qty 1

## 2023-09-22 NOTE — ED Notes (Signed)
 Call placed to CT, pt on list. States they will try to come get her shortly.

## 2023-09-22 NOTE — ED Notes (Signed)
 Patient transported to CT

## 2023-09-22 NOTE — ED Provider Notes (Signed)
  EMERGENCY DEPARTMENT AT [facility] Provider Note   CSN: [id number] Arrival date & time: 09/22/23  0915     History  Chief Complaint  Patient presents with   Kelly Howard is a 73 y.o. female.   Fall     Patient has history of hypothyroidism Parkinson's disease frequent falls.  Patient states she was walking with her walker when she stumbled and fell landing on her left hip.  Patient is now having severe pain in her left hip.  She initially tried to stand but was unable to do so because of the pain and discomfort.  She denies any other complaints of back pain or other extremity pain.  She did not hit her head or lose consciousness.  Home Medications Prior to Admission medications   Medication Sig Start Date End Date Taking? Authorizing Provider  acetaminophen  (TYLENOL ) 325 MG tablet Take 325-650 mg by mouth as needed for mild pain (pain score 1-3) or moderate pain (pain score 4-6).    [provider]  AMBULATORY NON FORMULARY MEDICATION Abdominal compression binder Dx: G20 11/07/19   Tat, Von Grumbling, DO  Carbidopa -Levodopa  ER (RYTARY ) 48.75-195 MG CPCR Take 2 tablets by mouth See admin instructions. 2 tabs 6am, 3 tabs at noon, 3 tabs at 6pm, 2 tabs at 10pm.    [provider]  cyanocobalamin  (VITAMIN B12) 1000 MCG/ML injection Inject into the muscle every 30 (thirty) days.    [provider]  denosumab (PROLIA) 60 MG/ML SOSY injection Inject 60 mg into the skin every 6 (six) months. 01/28/22   [provider]  levothyroxine  (SYNTHROID , LEVOTHROID) 50 MCG tablet Take 50 mcg by mouth daily before breakfast. 03/09/12   [provider]  loratadine (CLARITIN) 10 MG tablet Take 10 mg by mouth at bedtime.    [provider]  NUPLAZID  34 MG CAPS Take 1 capsule by mouth daily. 01/30/21   [provider]  rivastigmine  (EXELON ) 9.5 mg/24hr Place 9.5 mg onto the skin daily.    [provider]  sertraline  (ZOLOFT ) 100 MG tablet TAKE ONE TABLET DAILY. 05/10/23   Mozingo, Regina Nattalie, NP  traZODone  (DESYREL ) 50 MG tablet Take one to three tablets at bedtime. 05/10/23   Mozingo, Regina Nattalie, NP  Vibegron 75 MG TABS Take 75 mg by mouth at bedtime.    [provider]  VITAMIN D  PO Take 1 capsule by mouth daily.    [provider]      Allergies    Codeine, Escitalopram, and Propoxyphene    Review of Systems   Review of Systems  Physical Exam Updated Vital Signs BP 101/64   Pulse 72   Temp 98.1 F (36.7 C)   Resp 16   Ht 1.676 m (5\' 6" )   Wt 46.3 kg   LMP  (LMP Unknown) Comment: gyn  SpO2 99%   BMI 16.46 kg/m  Physical Exam Vitals and nursing note reviewed.  Constitutional:      General: She is not in acute distress.    Appearance: She is well-developed.  HENT:     Head: Normocephalic and atraumatic.     Right Ear: External ear normal.     Left Ear: External ear normal.  Eyes:     General: No scleral icterus.       Right eye: No discharge.        Left eye: No discharge.     Conjunctiva/sclera: Conjunctivae normal.  Neck:  Trachea: No tracheal deviation.  Cardiovascular:     Rate and Rhythm: Normal rate and regular rhythm.  Pulmonary:     Effort: Pulmonary effort is normal. No respiratory distress.     Breath sounds: Normal breath sounds. No stridor. No wheezing or rales.  Abdominal:     General: Bowel sounds are normal. There is no distension.     Palpations: Abdomen is soft.     Tenderness: There is no abdominal tenderness. There is no guarding or rebound.  Musculoskeletal:        General: Tenderness present. No deformity.     Cervical back: Neck supple.     Comments: No tenderness to palpation in her bilateral upper extremities or lower extremities with the exception of the left hip, no tenderness palpation in the cervical thoracic or lumbar spine, severe pain with limited range of motion of her left hip, tenderness  palpation left hip  Skin:    General: Skin is warm and dry.     Findings: No rash.  Neurological:     General: No focal deficit present.     Mental Status: She is alert.     Cranial Nerves: No cranial nerve deficit, dysarthria or facial asymmetry.     Sensory: No sensory deficit.     Motor: No abnormal muscle tone or seizure activity.     Coordination: Coordination normal.  Psychiatric:        Mood and Affect: Mood normal.     ED Results / Procedures / Treatments   Labs (all labs ordered are listed, but only abnormal results are displayed) Labs Reviewed  CBC WITH DIFFERENTIAL/PLATELET - Abnormal; Notable for the following components:      Result Value   RBC 3.80 (*)    Hemoglobin 11.0 (*)    HCT 34.7 (*)    All other components within normal limits  BASIC METABOLIC PANEL WITH GFR  TYPE AND SCREEN    EKG EKG Interpretation Date/Time:  Friday September 22 2023 09:32:13 EDT Ventricular Rate:  57 PR Interval:  122 QRS Duration:  96 QT Interval:  412 QTC Calculation: 402 R Axis:   65  Text Interpretation: Sinus rhythm Right atrial enlargement No significant change since last tracing Confirmed by Trish Furl [phone]) on 09/22/2023 10:09:31 AM  Radiology DG Chest 1 View Result Date: 09/22/2023 CLINICAL DATA:  Left hip pain following a fall. EXAM: CHEST  1 VIEW COMPARISON:  03/26/2023 FINDINGS: Normal sized heart. Atheromatous calcifications, including the coronary arteries and aorta. Clear lungs with normal vascularity. Old, healed right rib fractures. No acute fracture or pneumothorax. IMPRESSION: No acute abnormality. Electronically Signed   By: Catherin Closs M.D.   On: 09/22/2023 11:12   DG Hip Unilat With Pelvis 2-3 Views Left Result Date: 09/22/2023 CLINICAL DATA:  Fall and left hip pain. EXAM: DG HIP (WITH OR WITHOUT PELVIS) 2-3V LEFT COMPARISON:  Radiograph dated 06/14/2019. FINDINGS: There is no acute fracture or dislocation. The bones are osteopenic. Severe arthritic changes  of the left hip with loss of joint space and bone-on-bone contact. The soft tissues are unremarkable. IMPRESSION: 1. No acute fracture or dislocation. 2. 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 Electronically Signed   By: Angus Bark M.D.   On: 09/22/2023 11:11    Procedures Procedures    Medications Ordered in ED Medications  morphine  (PF) 4 MG/ML injection 4 mg (4 mg Intravenous Given 09/22/23 1014)  ondansetron  (ZOFRAN ) injection 4 mg (4 mg Intravenous Given 09/22/23 1014)  HYDROmorphone  (DILAUDID ) injection 1 mg (1 mg Intravenous Given 09/22/23 1134)  HYDROmorphone  (DILAUDID ) injection 1 mg (1 mg Intravenous Given 09/22/23 1254)  traZODone  (DESYREL ) tablet 25 mg (25 mg Oral Given 09/22/23 1332)    ED Course/ Medical Decision Making/ A&P Clinical Course as of 09/22/23 1525  Fri Sep 22, 2023  1125 X-ray does not show any acute fracture of the hip.  Severe arthritis changes noted [JK]  1126 Chest x-ray shows no acute abnormality [JK]  1126 CBC with Differential(!) CBC normal [JK]  1133 Patient still having severe pain.  Additional x-rays ordered [JK]  1242 Patient continues to have pain additional pain meds ordered [JK]  1301 Pt requesting a dose of her trazadone.  Feeling somewhat anxious.  She is on trazadone [JK]    Clinical Course User Index [JK] Trish Furl, MD                                 Medical Decision Making Amount and/or Complexity of Data Reviewed Labs: ordered. Decision-making details documented in ED Course. Radiology: ordered and independent interpretation performed.  Risk Prescription drug management. Parenteral controlled substances.   Patient presented to the ED for evaluation after a fall.  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  She has seen orthopedics in has been treated with injections.  Patient having severe pain today after fall.  Initial x-rays did not show any acute fracture.  Concerned about the possibility of occult fracture with  her pain and discomfort.  CT scan has been ordered.  Results are currently pending.  Care turned over to Dr. Delana Favors at shift change.        Final Clinical Impression(s) / ED Diagnoses Final diagnoses:  Fall, initial encounter    Rx / DC Orders ED Discharge Orders     None         Trish Furl, MD 09/22/23 1525

## 2023-09-22 NOTE — ED Notes (Signed)
 Pt was provided home meds by spouse per MD verbal order to administer.

## 2023-09-22 NOTE — H&P (Signed)
 History and Physical   Karlina Suares WGN:[id number] DOB: [birth year] DOA: 09/22/2023  PCP: Zilphia Hilt, Charyl Coppersmith, MD   Patient coming from: Home  Chief Complaint: Fall, hip pain  HPI: Kelly Howard is a 73 y.o. female with medical history significant of Parkinson disease, hypothyroidism, osteoporosis, falls, memory loss presenting after a fall.  Patient was reportedly walking with her walker color and stumbled and fell.  She landed on her left hip.  Initially try to stand afterwards but was unable to secondary to pain.  Does have history of frequent falls and Parkinson's disease.  She did not hit her head nor lose consciousness.  Unable to participate in review of systems due to sedation after pain medication.  ED Course: Vital signs in the ED notable for blood pressure in the 100s to 140s systolic.  Lab workup included BMP within normal limits.  CBC with hemoglobin stable 11.0.  Patient typed and screened in the ED.  Chest x-ray showed no acute abnormality.  Hip x-ray of the left showed no acute abnormality.  CT of the pelvis did show a acute, longitudinally oriented, mildly comminuted fracture of the left iliac wing, also demonstrated was diverticulosis and likely uterine fibroid.  Patient received Dilaudid , morphine , trazodone , Zofran , home carbidopa  levodopa  in the ED.  Orthopedic consulted and plans to see the patient and asked for medicine admission.  Review of Systems: Unable to participate in review of systems due to sedation after pain medication.  Past Medical History:  Diagnosis Date   Frequent falls    Hypothyroidism    Osteoporosis    Parkinson's disease (HCC)     Past Surgical History:  Procedure Laterality Date   TONSILLECTOMY     WRIST SURGERY      Social History  reports that she has never smoked. She has been exposed to tobacco smoke. She has never used smokeless tobacco. She reports current alcohol use. She reports that she does not use  drugs.  Allergies  Allergen Reactions   Codeine Other (See Comments)    GI Upset   Escitalopram Swelling and Other (See Comments)    Feet swell   Propoxyphene Other (See Comments)    Hallucinations    Family History  Problem Relation Age of Onset   Hypercalcemia Mother    Cancer Paternal Grandmother    Diabetes Paternal Grandfather   Reviewed on admission  Prior to Admission medications   Medication Sig Start Date End Date Taking? Authorizing Provider  Carbidopa -Levodopa  ER (RYTARY ) 48.75-195 MG CPCR Take 2 tablets by mouth See admin instructions. 2 tabs 6am, 3 tabs at noon, 3 tabs at 6pm, 2 tabs at 10pm.   Yes [provider]  acetaminophen  (TYLENOL ) 325 MG tablet Take 325-650 mg by mouth as needed for mild pain (pain score 1-3) or moderate pain (pain score 4-6).    [provider]  AMBULATORY NON FORMULARY MEDICATION Abdominal compression binder Dx: G20 11/07/19   Tat, Von Grumbling, DO  cyanocobalamin  (VITAMIN B12) 1000 MCG/ML injection Inject into the muscle every 30 (thirty) days.    [provider]  denosumab (PROLIA) 60 MG/ML SOSY injection Inject 60 mg into the skin every 6 (six) months. 01/28/22   [provider]  levothyroxine  (SYNTHROID , LEVOTHROID) 50 MCG tablet Take 50 mcg by mouth daily before breakfast. 03/09/12   [provider]  loratadine (CLARITIN) 10 MG tablet Take 10 mg by mouth at bedtime.    [provider]  NUPLAZID  34 MG CAPS Take  1 capsule by mouth daily. 01/30/21   [provider]  rivastigmine  (EXELON ) 9.5 mg/24hr Place 9.5 mg onto the skin daily.    [provider]  sertraline  (ZOLOFT ) 100 MG tablet TAKE ONE TABLET DAILY. 05/10/23   Mozingo, Regina Nattalie, NP  traZODone  (DESYREL ) 50 MG tablet Take one to three tablets at bedtime. 05/10/23   Mozingo, Regina Nattalie, NP  Vibegron 75 MG TABS Take 75 mg by mouth at bedtime.    [provider]  VITAMIN D  PO Take 1 capsule by mouth  daily.    [provider]    Physical Exam: Vitals:   09/22/23 1030 09/22/23 1136 09/22/23 1444 09/22/23 1700  BP:  105/63 101/64 (!) 142/79  Pulse: 69 90 72 88  Resp: 17 18 16 14   Temp:  98.3 F (36.8 C) 98.1 F (36.7 C) 98.3 F (36.8 C)  TempSrc:  Oral  Oral  SpO2: 100% 100% 99% 98%  Weight:      Height:        Physical Exam Constitutional:      General: She is not in acute distress.    Comments: Drowsy/sedated after pain medication.  HENT:     Head: Normocephalic and atraumatic.     Mouth/Throat:     Mouth: Mucous membranes are moist.     Pharynx: Oropharynx is clear.  Eyes:     Extraocular Movements: Extraocular movements intact.     Pupils: Pupils are equal, round, and reactive to light.  Cardiovascular:     Rate and Rhythm: Normal rate and regular rhythm.     Pulses: Normal pulses.     Heart sounds: Normal heart sounds.  Pulmonary:     Effort: Pulmonary effort is normal. No respiratory distress.     Breath sounds: Normal breath sounds.  Abdominal:     General: Bowel sounds are normal. There is no distension.     Palpations: Abdomen is soft.     Tenderness: There is no abdominal tenderness.  Musculoskeletal:        General: No swelling or deformity.     Comments: Bilateral lower extremities neurovascularly intact.  Skin:    General: Skin is warm and dry.  Neurological:     General: No focal deficit present.     Mental Status: Mental status is at baseline.    Labs on Admission: I have personally reviewed following labs and imaging studies  CBC: Recent Labs  Lab 09/22/23 1015  WBC 6.9  NEUTROABS 5.4  HGB 11.0*  HCT 34.7*  MCV 91.3  PLT 274    Basic Metabolic Panel: Recent Labs  Lab 09/22/23 1015  NA 139  K 4.1  CL 105  CO2 27  GLUCOSE 88  BUN 12  CREATININE 0.62  CALCIUM 9.0    GFR: Estimated Creatinine Clearance: 45.8 mL/min (by C-G formula based on SCr of 0.62 mg/dL).  Liver Function Tests: No results for input(s):  "AST", "ALT", "ALKPHOS", "BILITOT", "PROT", "ALBUMIN" in the last 168 hours.  Urine analysis:    Component Value Date/Time   COLORURINE COLORLESS (A) 03/26/2023 2023   APPEARANCEUR CLEAR 03/26/2023 2023   LABSPEC <1.005 (L) 03/26/2023 2023   PHURINE 6.5 03/26/2023 2023   GLUCOSEU NEGATIVE 03/26/2023 2023   GLUCOSEU NEGATIVE 09/03/2020 1604   HGBUR NEGATIVE 03/26/2023 2023   BILIRUBINUR NEGATIVE 03/26/2023 2023   BILIRUBINUR neg 10/28/2020 1520   KETONESUR NEGATIVE 03/26/2023 2023   PROTEINUR NEGATIVE 03/26/2023 2023   UROBILINOGEN 0.2 10/28/2020 1520  UROBILINOGEN 0.2 09/03/2020 1604   NITRITE NEGATIVE 03/26/2023 2023   LEUKOCYTESUR NEGATIVE 03/26/2023 2023    Radiological Exams on Admission: CT PELVIS WO CONTRAST Result Date: 09/22/2023 CLINICAL DATA:  Left hip pain after fall EXAM: CT PELVIS WITHOUT CONTRAST TECHNIQUE: Multidetector CT imaging of the pelvis was performed following the standard protocol without intravenous contrast. RADIATION DOSE REDUCTION: This exam was performed according to the departmental dose-optimization program which includes automated exposure control, adjustment of the mA and/or kV according to patient size and/or use of iterative reconstruction technique. COMPARISON:  09/22/2023 FINDINGS: Urinary Tract: The urinary bladder is distended without focal abnormality. Bowel: No small bowel wall thickening or inflammation. No small bowel obstruction. Normal appendix. Sigmoid diverticulosis. Vascular/Lymphatic: No aortic aneurysm. No intraabdominal or pelvic lymphadenopathy. Reproductive: Age-related atrophy of the uterus and ovaries. No concerning adnexal mass. Mildly hyperdense structure along the anterior uterine body measuring 2.1 cm, probably a uterine fibroid.No free pelvic fluid. Other: No pneumoperitoneum, ascites, or mesenteric inflammation. Musculoskeletal: Diffuse osteopenia.Acute, longitudinal, mildly comminuted fracture of the left iliac wing. Severe  osteoarthritis of the left hip with bone on bone articulation. Moderate right hip osteoarthritis. Degenerative disc disease at L4-L5 with lumbar facet arthropathy. Cortical screw and plate fixation of the distal right radius. IMPRESSION: 1. Acute, longitudinally oriented, mildly comminuted, isolated fracture of the left iliac wing. 2. Sigmoid diverticulosis.  No changes of acute diverticulitis. 3. Likely uterine fibroid along the anterior uterus measuring 2.1 cm. The Electronically Signed   By: Rance Burrows M.D.   On: 09/22/2023 16:17   DG Chest 1 View Result Date: 09/22/2023 CLINICAL DATA:  Left hip pain following a fall. EXAM: CHEST  1 VIEW COMPARISON:  03/26/2023 FINDINGS: Normal sized heart. Atheromatous calcifications, including the coronary arteries and aorta. Clear lungs with normal vascularity. Old, healed right rib fractures. No acute fracture or pneumothorax. IMPRESSION: No acute abnormality. Electronically Signed   By: Catherin Closs M.D.   On: 09/22/2023 11:12   DG Hip Unilat With Pelvis 2-3 Views Left Result Date: 09/22/2023 CLINICAL DATA:  Fall and left hip pain. EXAM: DG HIP (WITH OR WITHOUT PELVIS) 2-3V LEFT COMPARISON:  Radiograph dated 06/14/2019. FINDINGS: There is no acute fracture or dislocation. The bones are osteopenic. Severe arthritic changes of the left hip with loss of joint space and bone-on-bone contact. The soft tissues are unremarkable. IMPRESSION: 1. No acute fracture or dislocation. 2. 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 Electronically Signed   By: Angus Bark M.D.   On: 09/22/2023 11:11   EKG: Independently reviewed.  Sinus rhythm at 57 bpm.  Nonspecific T wave changes.  Assessment/Plan Principal Problem:   Closed fracture of left iliac crest, initial encounter [facility]) Active Problems:   Hypothyroidism   Parkinson disease (HCC)   Frequent falls   Memory loss due to medical condition   Hip fracture > Patient presenting after a fall at home where she  landed on her left hip and had pain on standing. > Initial x-ray negative but CT showed longitudinally oriented acute comminuted fracture of the left iliac wing. > Orthopedic surgery consulted and will see the patient.  Unclear if any surgical intervention will be done at this time.  For now they asked for medicine to admit for pain control. > Patient reports that they follow with Dr. Abigail Abler outpatient and will wait for his input as they have been considering a hip replacement already but this will complicated; they have been trying to coordinate with her neurologist at [facility]  as well. - Appreciate orthopedic surgery recommendations and assistance - Monitor on telemetry overnight - As needed pain medication with Tylenol  for mild pain, Norco for moderate to severe pain, Dilaudid  for severe breakthrough pain - Hip fracture protocol  Parkinson's disease Parkinson's dementia - Continue home carbidopa  levodopa  - Continue Nuplazid  for hallucinations - Continue rivastigmine  patch for dementia - Continue trazodone  - Continue sertraline  - Is also prescribed an inhalable form of levodopa  but has not had much improvement on this so not attempted to reorder.  Hypothyroidism - Continue home Synthroid   Osteoporosis - On Prolia injection outpatient per chart  DVT prophylaxis: SCDs for now Code Status:   Full Family Communication:  Updated at bedside  Disposition Plan:   Patient is from:  Home  Anticipated DC to:  Pending clinical course  Anticipated DC date:  2 to 5 days  Anticipated DC barriers: None  Consults called:  Orthopedic surgery Admission status:  Inpatient, telemetry  Severity of Illness: The appropriate patient status for this patient is INPATIENT. Inpatient status is judged to be reasonable and necessary in order to provide the required intensity of service to ensure the patient's safety. The patient's presenting symptoms, physical exam findings, and initial radiographic and  laboratory data in the context of their chronic comorbidities is felt to place them at high risk for further clinical deterioration. Furthermore, it is not anticipated that the patient will be medically stable for discharge from the hospital within 2 midnights of admission.   * I certify that at the point of admission it is my clinical judgment that the patient will require inpatient hospital care spanning beyond 2 midnights from the point of admission due to high intensity of service, high risk for further deterioration and high frequency of surveillance required.Johnetta Nab MD [facility]  How to contact the [facility] Attending or Consulting provider 7A - 7P or covering provider during after hours 7P -7A, for this patient?   Check the care team in [facility] and look for a) attending/consulting [facility] provider listed and b) the [facility] team listed Log into [email or web address] and use [facility] universal password to access. If you do not have the password, please contact the hospital operator. Locate the [facility] provider you are looking for under [facility] and page to a number that you can be directly reached. If you still have difficulty reaching the provider, please page the [facility] (Director on Call) for the Hospitalists listed on amion for assistance.  09/22/2023, 6:22 PM

## 2023-09-22 NOTE — ED Notes (Signed)
 Sister in law to nurses desk to report pt refused to take her home meds.

## 2023-09-22 NOTE — ED Provider Notes (Signed)
  Physical Exam  BP 101/64   Pulse 72   Temp 98.1 F (36.7 C)   Resp 16   Ht 5\' 6"  (1.676 m)   Wt 46.3 kg   LMP  (LMP Unknown) Comment: gyn  SpO2 99%   BMI 16.46 kg/m   Physical Exam  Procedures  Procedures  ED Course / MDM   Clinical Course as of 09/22/23 1701  Fri Sep 22, 2023  1125 X-ray does not show any acute fracture of the hip.  Severe arthritis changes noted [JK]  1126 Chest x-ray shows no acute abnormality [JK]  1126 CBC with Differential(!) CBC normal [JK]  1133 Patient still having severe pain.  Additional x-rays ordered [JK]  1242 Patient continues to have pain additional pain meds ordered [JK]  1301 Pt requesting a dose of her trazadone.  Feeling somewhat anxious.  She is on trazadone [JK]    Clinical Course User Index [JK] Trish Furl, MD   Medical Decision Making Care assumed at 3 PM.  Patient has history of chronic left hip problem and had a fall.  Patient unable to bear weight on the left hip.  X-ray did not show any fracture but concern for possible occult fracture so CT was ordered with result pending  5:02 PM CT showed acute longitudinally oriented and comminuted left iliac wing fracture.  Patient required multiple doses of pain medicine.  Patient has been followed with Dr. Abigail Abler from Ortho. I talked to Dr. Guyann Leitz who will consult on patient and recommended medicine admission for pain control.   Problems Addressed: Closed nondisplaced avulsion fracture of left ilium, initial encounter [facility]): acute illness or injury Fall, initial encounter: acute illness or injury  Amount and/or Complexity of Data Reviewed Labs: ordered. Decision-making details documented in ED Course. Radiology: ordered and independent interpretation performed. Decision-making details documented in ED Course.  Risk Prescription drug management.          Dalene Duck, MD 09/22/23 [phone]

## 2023-09-22 NOTE — ED Notes (Signed)
Patient and family updated on plan of care.

## 2023-09-22 NOTE — Progress Notes (Signed)
 Orthopedic Tech Progress Note Patient Details:  Kelly Howard 04/18/1951 [id number]  Patient ID: Kelly Howard, female   DOB: 24-Oct-1950, 73 y.o.   MRN: [id number] No OHF. Age Kelly Howard 09/22/2023, 6:49 PM

## 2023-09-22 NOTE — ED Triage Notes (Signed)
 Pt presents to ED via EMS, mechanical fall landing on left side. Did not hit head, no blood thinners. Pt complaining of left hip pain. GCS 15.

## 2023-09-23 DIAGNOSIS — S32302A Unspecified fracture of left ilium, initial encounter for closed fracture: Secondary | ICD-10-CM | POA: Diagnosis not present

## 2023-09-23 LAB — CBC
HCT: 35.4 % — ABNORMAL LOW (ref 36.0–46.0)
Hemoglobin: 11.3 g/dL — ABNORMAL LOW (ref 12.0–15.0)
MCH: 28.4 pg (ref 26.0–34.0)
MCHC: 31.9 g/dL (ref 30.0–36.0)
MCV: 88.9 fL (ref 80.0–100.0)
Platelets: 263 10*3/uL (ref 150–400)
RBC: 3.98 MIL/uL (ref 3.87–5.11)
RDW: 13.1 % (ref 11.5–15.5)
WBC: 6.7 10*3/uL (ref 4.0–10.5)
nRBC: 0 % (ref 0.0–0.2)

## 2023-09-23 LAB — BASIC METABOLIC PANEL WITH GFR
Anion gap: 12 (ref 5–15)
BUN: 16 mg/dL (ref 8–23)
CO2: 26 mmol/L (ref 22–32)
Calcium: 9.2 mg/dL (ref 8.9–10.3)
Chloride: 101 mmol/L (ref 98–111)
Creatinine, Ser: 0.6 mg/dL (ref 0.44–1.00)
GFR, Estimated: >60 mL/min (ref >60)
Glucose, Bld: 96 mg/dL (ref 70–99)
Potassium: 3.8 mmol/L (ref 3.5–5.1)
Sodium: 139 mmol/L (ref 135–145)

## 2023-09-23 LAB — ABO/RH: ABO/RH(D): O POS

## 2023-09-23 MED ORDER — GLYCOPYRROLATE 1 MG PO TABS
0.5000 mg | ORAL_TABLET | Freq: Every day | ORAL | Status: AC
  Administered 2023-09-24 – 2023-09-26 (×3): 0.5 mg via ORAL
  Filled 2023-09-23 (×3): qty 1

## 2023-09-23 MED ORDER — GLYCOPYRROLATE 1 MG PO TABS
0.5000 mg | ORAL_TABLET | Freq: Once | ORAL | Status: AC
  Administered 2023-09-23: 0.5 mg via ORAL
  Filled 2023-09-23: qty 1

## 2023-09-23 MED ORDER — CARBIDOPA-LEVODOPA ER 48.75-195 MG PO CPCR
2.0000 | ORAL_CAPSULE | Freq: Once | ORAL | Status: AC
  Administered 2023-09-23: 2 via ORAL
  Filled 2023-09-23 (×2): qty 2

## 2023-09-23 MED ORDER — CARBIDOPA-LEVODOPA ER 48.75-195 MG PO CPCR
2.0000 | ORAL_CAPSULE | Freq: Every day | ORAL | Status: DC
  Administered 2023-09-23 – 2023-09-27 (×18): 2 via ORAL
  Filled 2023-09-23 (×20): qty 2

## 2023-09-23 MED ORDER — CARBIDOPA-LEVODOPA ER 48.75-195 MG PO CPCR
1.0000 | ORAL_CAPSULE | Freq: Once | ORAL | Status: DC
  Filled 2023-09-23: qty 1

## 2023-09-23 NOTE — Plan of Care (Signed)
  Problem: Health Behavior/Discharge Planning: Goal: Ability to manage health-related needs will improve Outcome: Progressing   Problem: Pain Managment: Goal: General experience of comfort will improve and/or be controlled Outcome: Progressing   Problem: Safety: Goal: Ability to remain free from injury will improve Outcome: Progressing

## 2023-09-23 NOTE — Progress Notes (Signed)
 [facility] ROUNDING NOTE Bronislawa Ferrier ZOX:[id number]  DOB: [birth year]  DOA: 09/22/2023  PCP: Zilphia Hilt, Charyl Coppersmith, MD  09/23/2023,7:37 AM  LOS: 1 day    Code Status: Full code   from: Home current Dispo: Unclear   73 year old white female with Parkinson's disease Lewy body dementia follows with prior left clavicular fracture 03/21/2023 10/27 admission syncope with orthostasis likely from Parkinson's hypothyroid etc.-previous arthritis of hip with injections--was supposed to be considering hip replacement 4/25 came to ED with mechanical fall landing on left side GCS 15-sodium 139 potassium 4.1 BUN/creatinine 12/0.6, WBC 6.9 hemoglobin 11 platelet 274 CXR no abnormality hip x-ray no acute fracture or dislocation arthritic changes left hip-CT pelvis acute long truly oriented mildly comminuted fracture left iliac wing uterine fibroid 2.1 cm diverticulosis no diverticulitis  Plan  Orthostasis in the setting of Parkinson's/mild Lewy body dementia Likely Parkinson's related orthostasis-patient is not on any antihypertensives-May require consideration midodrine etc. Risks versus benefit need to be considered of these medications--previously she has used an abdominal binder For now consider Nuplazid  34 daily,, Rytary  48.7-1 95 ---6 AM, 10 AM, 2 PM, 6 PM, 10 PM and try to keep on schedule as could affect mentation if not taken on time Continue pimavanserin  34 daily Pain control Norco 1 every 6 as needed pain 4-6, Dilaudid  0.5-3 as needed breakthrough  Frequent falls secondary to orthostasis prior left clavicular fracture-current left iliac wing longitudinal comminuted fracture Uncertain if fracture is operable given comminuted state-awaiting Ortho input have contacted them to ensure we have some clarity Therapy to evaluate once clearance given by Ortho  Hypothyroidism Continue Synthroid  50  Behavioral disturbances from Lewy body dementia For Lewy body dementia continue Zoloft  50 daily trazodone   100 at bedtime Exelon  patch has risk of anticholinergic effect etc. and may be associated with falls-will discuss with family if we should discontinue  Urinary incontinence Continue Myrbetriq 25 daily  DVT prophylaxis: SCD  Status is: Inpatient Remains inpatient appropriate because:   Needs pain management in addition to mobility and possible surgery await orthopedic input   Subjective: Coherent x 2 thinks that she is in [place] however knows that the president is Trump and it is 2025 Cannot tell me which state of the country that she is in  Objective + exam Vitals:   09/22/23 1700 09/22/23 2142 09/22/23 2229 09/23/23 0400  BP: (!) 142/79 125/79 136/88 102/65  Pulse: 88 81 82 72  Resp: 14 16 16 16   Temp: 98.3 F (36.8 C)  98.1 F (36.7 C) 98 F (36.7 C)  TempSrc: Oral  Oral Oral  SpO2: 98% 98% 96% 98%  Weight:      Height:       Filed Weights   09/22/23 0935  Weight: 46.3 kg    Examination: Frail white female no distress no icterus no pallor S1-S2 no murmur ROM intact upper extremities mild tremor Straight leg raise on the left side quite antalgic with about 45 degrees knee bend causes severe pain I did not examine further  Data Reviewed: reviewed   CBC    Component Value Date/Time   WBC 6.7 09/23/2023 0704   RBC 3.98 09/23/2023 0704   HGB 11.3 (L) 09/23/2023 0704   HCT 35.4 (L) 09/23/2023 0704   PLT 263 09/23/2023 0704   MCV 88.9 09/23/2023 0704   MCH 28.4 09/23/2023 0704   MCHC 31.9 09/23/2023 0704   RDW 13.1 09/23/2023 0704   LYMPHSABS 0.7 09/22/2023 1015   MONOABS 0.6 09/22/2023 1015  EOSABS 0.1 09/22/2023 1015   BASOSABS 0.0 09/22/2023 1015      Latest Ref Rng & Units 09/22/2023   10:15 AM 03/26/2023    4:20 PM 09/14/2022    9:31 AM  CMP  Glucose 70 - 99 mg/dL 88  96  91   BUN 8 - 23 mg/dL 12  19  16    Creatinine 0.44 - 1.00 mg/dL 7.82  9.56  2.13   Sodium 135 - 145 mmol/L 139  138  140   Potassium 3.5 - 5.1 mmol/L 4.1  3.5  3.6    Chloride 98 - 111 mmol/L 105  100  101   CO2 22 - 32 mmol/L 27  32  29   Calcium 8.9 - 10.3 mg/dL 9.0  9.5  9.1   Total Protein 6.5 - 8.1 g/dL  6.8  6.8   Total Bilirubin 0.3 - 1.2 mg/dL  0.6  0.6   Alkaline Phos 38 - 126 U/L  54  61   AST 15 - 41 U/L  12  13   ALT 0 - 44 U/L  <5  3     Scheduled Meds:  Carbidopa -Levodopa  ER  2 tablet Oral BID   And   Carbidopa -Levodopa  ER  3 tablet Oral BID   levothyroxine   50 mcg Oral QAC breakfast   mirabegron ER  25 mg Oral Daily   Pimavanserin  Tartrate  1 capsule Oral Daily   rivastigmine   9.5 mg Transdermal Daily   sertraline   50 mg Oral Daily   traZODone   50 mg Oral QHS   Continuous Infusions:  Time 46  Jai-Gurmukh Lilyona Richner, MD  [facility]

## 2023-09-23 NOTE — Progress Notes (Signed)
 Informed pt's spouse, Morris Leitao to bring pt's home med Carbidopa -Levodopa  (Rytary ) per pharmacy to be given throughout hospital admission. Spouse to bring home meds today.

## 2023-09-23 NOTE — Progress Notes (Signed)
 CCMD notified RN this morning that patient is showing vent bigeminy on the monitor. Pt resting comfortably upon assessment. Will notify MD and oncoming nurse.

## 2023-09-24 DIAGNOSIS — S32302A Unspecified fracture of left ilium, initial encounter for closed fracture: Secondary | ICD-10-CM | POA: Diagnosis not present

## 2023-09-24 LAB — CBC WITH DIFFERENTIAL/PLATELET
Abs Immature Granulocytes: 0.02 10*3/uL (ref 0.00–0.07)
Basophils Absolute: 0 10*3/uL (ref 0.0–0.1)
Basophils Relative: 0 %
Eosinophils Absolute: 0.1 10*3/uL (ref 0.0–0.5)
Eosinophils Relative: 1 %
HCT: 36.5 % (ref 36.0–46.0)
Hemoglobin: 11.9 g/dL — ABNORMAL LOW (ref 12.0–15.0)
Immature Granulocytes: 0 %
Lymphocytes Relative: 8 %
Lymphs Abs: 0.7 10*3/uL (ref 0.7–4.0)
MCH: 28.8 pg (ref 26.0–34.0)
MCHC: 32.6 g/dL (ref 30.0–36.0)
MCV: 88.4 fL (ref 80.0–100.0)
Monocytes Absolute: 0.9 10*3/uL (ref 0.1–1.0)
Monocytes Relative: 12 %
Neutro Abs: 6.3 10*3/uL (ref 1.7–7.7)
Neutrophils Relative %: 79 %
Platelets: 247 10*3/uL (ref 150–400)
RBC: 4.13 MIL/uL (ref 3.87–5.11)
RDW: 13.2 % (ref 11.5–15.5)
WBC: 8 10*3/uL (ref 4.0–10.5)
nRBC: 0 % (ref 0.0–0.2)

## 2023-09-24 LAB — BASIC METABOLIC PANEL WITH GFR
Anion gap: 9 (ref 5–15)
BUN: 14 mg/dL (ref 8–23)
CO2: 29 mmol/L (ref 22–32)
Calcium: 9 mg/dL (ref 8.9–10.3)
Chloride: 101 mmol/L (ref 98–111)
Creatinine, Ser: 0.59 mg/dL (ref 0.44–1.00)
GFR, Estimated: >60 mL/min (ref >60)
Glucose, Bld: 113 mg/dL — ABNORMAL HIGH (ref 70–99)
Potassium: 4.5 mmol/L (ref 3.5–5.1)
Sodium: 139 mmol/L (ref 135–145)

## 2023-09-24 MED ORDER — LORAZEPAM 2 MG/ML IJ SOLN: 0.5000 mg | Freq: Four times a day (QID) | INTRAMUSCULAR | Status: DC | PRN

## 2023-09-24 MED ORDER — LORAZEPAM 0.5 MG PO TABS
0.5000 mg | ORAL_TABLET | Freq: Four times a day (QID) | ORAL | Status: DC | PRN
  Administered 2023-09-24 – 2023-09-26 (×5): 0.5 mg via ORAL
  Filled 2023-09-24 (×6): qty 1

## 2023-09-24 MED ORDER — QUETIAPINE FUMARATE 25 MG PO TABS
25.0000 mg | ORAL_TABLET | Freq: Every day | ORAL | Status: DC
  Administered 2023-09-24 – 2023-09-26 (×3): 25 mg via ORAL
  Filled 2023-09-24 (×3): qty 1

## 2023-09-24 NOTE — Progress Notes (Signed)
 Transition of Care [facility]) - CAGE-AID Screening   Patient Details  Name: Kelly Howard MRN: [id number] Date of Birth: 26-Jun-1950   Asa Bjork, RN Trauma Response Nurse Phone Number: [phone] 09/24/2023, 3:42 PM       CAGE-AID Screening: Substance Abuse Screening unable to be completed due to: : (S) Patient unable to participate (pt has Lewy Body Dementia and has been confused)             Substance Abuse Education Offered: (S) No

## 2023-09-24 NOTE — Evaluation (Signed)
 Occupational Therapy Evaluation Patient Details Name: Kelly Howard MRN: [id number] DOB: January 26, 1951 Today's Date: 09/24/2023   History of Present Illness   73 y/o F presenting to ED on 4/25 after mechanical fall landing on L side, CT with acute comminuted fx of L iliac wing. PMH includes parkinson's disease, hypothyroidism, osteoporosis, falls, lewy body dementia, L clavicle fx     Clinical Impressions Pt has assist at baseline from spouse for ADLs, ambulates without AD. Pt lives with spouse and also has PCA that comes 5 days/week x5 hours/day. Pt currently needing up to mod A for ADLs, min-mod A +2 for transfers with RW. Pt with incr pain in L hip/LLE with pivot transfer to/from [facility]. Pt presenting with impairments listed below, will follow acutely. Recommend HHOT at d/c given pt still has assist from spouse/aide 24/7.     If plan is discharge home, recommend the following:   A lot of help with walking and/or transfers;A lot of help with bathing/dressing/bathroom;Assistance with cooking/housework;Direct supervision/assist for medications management;Direct supervision/assist for financial management;Assist for transportation;Help with stairs or ramp for entrance;Supervision due to cognitive status     Functional Status Assessment   Patient has had a recent decline in their functional status and demonstrates the ability to make significant improvements in function in a reasonable and predictable amount of time.     Equipment Recommendations   BSC/3in1     Recommendations for Other Services   PT consult     Precautions/Restrictions   Precautions Precautions: Fall Restrictions Weight Bearing Restrictions Per Provider Order: Yes RLE Weight Bearing Per Provider Order: Weight bearing as tolerated LLE Weight Bearing Per Provider Order: Weight bearing as tolerated     Mobility Bed Mobility               General bed mobility comments: in recliner upon arrival  and departure    Transfers Overall transfer level: Needs assistance Equipment used: Rolling walker (2 wheels) Transfers: Sit to/from Stand Sit to Stand: Min assist, Mod assist, +2 physical assistance                  Balance Overall balance assessment: Needs assistance Sitting-balance support: Feet supported Sitting balance-Leahy Scale: Fair     Standing balance support: Reliant on assistive device for balance, During functional activity Standing balance-Leahy Scale: Poor Standing balance comment: reliant on external support                           ADL either performed or assessed with clinical judgement   ADL Overall ADL's : Needs assistance/impaired Eating/Feeding: Minimal assistance;Sitting   Grooming: Minimal assistance;Sitting   Upper Body Bathing: Moderate assistance   Lower Body Bathing: Moderate assistance   Upper Body Dressing : Moderate assistance   Lower Body Dressing: Moderate assistance   Toilet Transfer: Moderate assistance;+2 for physical assistance;Minimal assistance   Toileting- Clothing Manipulation and Hygiene: Contact guard assist       Functional mobility during ADLs: Rolling walker (2 wheels);Minimal assistance;Moderate assistance;+2 for safety/equipment       Vision Baseline Vision/History: 1 Wears glasses Vision Assessment?: No apparent visual deficits     Perception Perception: Not tested       Praxis Praxis: Not tested       Pertinent Vitals/Pain Pain Assessment Pain Assessment: Faces Pain Score: 7  Faces Pain Scale: Hurts whole lot Pain Location: L hip Pain Descriptors / Indicators: Discomfort Pain Intervention(s): Limited activity within patient's tolerance, Monitored during  session, Repositioned     Extremity/Trunk Assessment Upper Extremity Assessment Upper Extremity Assessment: Generalized weakness (dyskinetic, writhing movements in UE)   Lower Extremity Assessment Lower Extremity Assessment:  Defer to PT evaluation   Cervical / Trunk Assessment Cervical / Trunk Assessment: Kyphotic   Communication Communication Communication: No apparent difficulties   Cognition Arousal: Alert Behavior During Therapy: WFL for tasks assessed/performed Cognition: History of cognitive impairments             OT - Cognition Comments: hx dementia, follows commands with incr cues and incr time                 Following commands: Impaired Following commands impaired: Follows one step commands with increased time     Cueing  General Comments   Cueing Techniques: Verbal cues;Gestural cues  VSS   Exercises     Shoulder Instructions      Home Living Family/patient expects to be discharged to:: Private residence Living Arrangements: Spouse/significant other Available Help at Discharge: Family;Personal care attendant;Available 24 hours/day Type of Home: House Home Access: Level entry     Home Layout: Two level;Able to live on main level with bedroom/bathroom     Bathroom Shower/Tub: Producer, television/film/video: Standard Bathroom Accessibility: Yes   Home Equipment: Agricultural consultant (2 wheels);Cane - single point;Shower seat - built in;Grab bars - toilet;Grab bars - tub/shower   Additional Comments: aide 5 days/week from 9am-2pm      Prior Functioning/Environment Prior Level of Function : Needs assist;History of Falls (last six months)             Mobility Comments: PRN use of RW ADLs Comments: assist for ADLs, IADLs    OT Problem List: Decreased strength;Decreased range of motion;Decreased activity tolerance;Impaired balance (sitting and/or standing);Decreased coordination;Decreased cognition;Decreased safety awareness   OT Treatment/Interventions: Self-care/ADL training;Therapeutic exercise;Energy conservation;DME and/or AE instruction;Therapeutic activities;Patient/family education;Balance training      OT Goals(Current goals can be found in the care  plan section)   Acute Rehab OT Goals Patient Stated Goal: none stated OT Goal Formulation: With patient Time For Goal Achievement: 10/08/23 Potential to Achieve Goals: Good ADL Goals Pt Will Perform Grooming: with supervision;standing Pt Will Perform Upper Body Dressing: with supervision;sitting Pt Will Perform Lower Body Dressing: with min assist;sitting/lateral leans;sit to/from stand Pt Will Transfer to Toilet: with contact guard assist;ambulating;regular height toilet   OT Frequency:  Min 2X/week    Co-evaluation              AM-PAC OT "6 Clicks" Daily Activity     Outcome Measure Help from another person eating meals?: A Little Help from another person taking care of personal grooming?: A Little Help from another person toileting, which includes using toliet, bedpan, or urinal?: A Lot Help from another person bathing (including washing, rinsing, drying)?: A Lot Help from another person to put on and taking off regular upper body clothing?: A Lot Help from another person to put on and taking off regular lower body clothing?: A Lot 6 Click Score: 14   End of Session Equipment Utilized During Treatment: Gait belt;Rolling walker (2 wheels) Nurse Communication: Mobility status  Activity Tolerance: Patient tolerated treatment well Patient left: in chair;with call bell/phone within reach;with chair alarm set;with family/visitor present  OT Visit Diagnosis: Unsteadiness on feet (R26.81);Other abnormalities of gait and mobility (R26.89);Muscle weakness (generalized) (M62.81);History of falling (Z91.81);Other symptoms and signs involving the nervous system (R29.898)  Time: 1610-9604 OT Time Calculation (min): 26 min Charges:  OT General Charges $OT Visit: 1 Visit OT Evaluation $OT Eval Moderate Complexity: 1 Mod  Sarahelizabeth Conway K, OTD, OTR/L SecureChat Preferred Acute Rehab [phone]   Benedict Brain Koonce 09/24/2023, 3:23 PM

## 2023-09-24 NOTE — Progress Notes (Signed)
 TRH ROUNDING NOTE Kelly Howard ZOX:[id number]  DOB: 1950-06-25  DOA: 09/22/2023  PCP: Zilphia Hilt, Charyl Coppersmith, MD  09/24/2023,10:02 AM  LOS: 2 days    Code Status: Full code   from: Home current Dispo: Unclear   73 year old white female with Parkinson's disease Lewy body dementia follows with prior left clavicular fracture 03/21/2023 10/27 admission syncope with orthostasis likely from Parkinson's hypothyroid etc.-previous arthritis of hip with injections--was supposed to be considering hip replacement 4/25 came to ED with mechanical fall landing on left side GCS 15-sodium 139 potassium 4.1 BUN/creatinine 12/0.6, WBC 6.9 hemoglobin 11 platelet 274 CXR no abnormality hip x-ray no acute fracture or dislocation arthritic changes left hip-CT pelvis acute long truly oriented mildly comminuted fracture left iliac wing uterine fibroid 2.1 cm diverticulosis no diverticulitis  Plan  Orthostasis in the setting of Parkinson's/mild 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-no prescribed antihypertensives-May require consideration midodrine etc.--previously she has used an abdominal binder Reordered home meds carbidopa  2 capsules 5 times daily with home schedule [Rytary ]-pimavanserin  34 daily  Frequent falls secondary to orthostasis prior left clavicular fracture-current left iliac wing longitudinal comminuted fracture Pain control Norco 1 every 6 as needed pain 4-6, Dilaudid  0.5-3 as needed breakthrough Was able to mobilize a little bit to the chair without severe discomfort but high risk of falls Therapy and put pending can be weightbearing as tolerated as per Ortho trauma Mr. Kelly Howard who I spoke with on 4/26  Hypothyroidism Continue Synthroid  50  Behavioral disturbances from Lewy body dementia continue Zoloft  50 daily trazodone  50 at bedtime Exelon  patch has risk of anticholinergic effect etc. and may be associated with falls-will discuss with family if we should  discontinue  Urinary incontinence Continue Myrbetriq 25 daily  DVT prophylaxis: SCD  Status is: Inpatient Remains inpatient appropriate because:   Needs pain management in addition to mobility and possible surgery await orthopedic input   Subjective:  Incoherent does not know place or time-asking "what is the plan" Does not seem to be in severe pain She has been restless and getting up at the bedside several times today and husband has been called to set with her  Objective + exam Vitals:   09/23/23 1618 09/23/23 1933 09/24/23 0437 09/24/23 0805  BP: (!) 141/83 (!) 119/54 119/76 125/70  Pulse: 72 79 78 89  Resp: 15 17 17 16   Temp: 99.1 F (37.3 C) 98.1 F (36.7 C) 97.7 F (36.5 C) 98 F (36.7 C)  TempSrc: Oral   Oral  SpO2: 97% 95% 90% 99%  Weight:      Height:       Filed Weights   09/22/23 0935  Weight: 46.3 kg    Examination:  Frail white female no distress More awake-incoherent does not know time place person S1-S2 no murmur ROM intact Abdomen soft no rebound no guarding Straight leg raise not examined today  Data Reviewed: reviewed   CBC    Component Value Date/Time   WBC 8.0 09/24/2023 0602   RBC 4.13 09/24/2023 0602   HGB 11.9 (L) 09/24/2023 0602   HCT 36.5 09/24/2023 0602   PLT 247 09/24/2023 0602   MCV 88.4 09/24/2023 0602   MCH 28.8 09/24/2023 0602   MCHC 32.6 09/24/2023 0602   RDW 13.2 09/24/2023 0602   LYMPHSABS 0.7 09/24/2023 0602   MONOABS 0.9 09/24/2023 0602   EOSABS 0.1 09/24/2023 0602   BASOSABS 0.0 09/24/2023 0602      Latest Ref Rng & Units 09/24/2023  6:02 AM 09/23/2023    7:04 AM 09/22/2023   10:15 AM  CMP  Glucose 70 - 99 mg/dL 161  96  88   BUN 8 - 23 mg/dL 14  16  12    Creatinine 0.44 - 1.00 mg/dL 0.96  0.45  4.09   Sodium 135 - 145 mmol/L 139  139  139   Potassium 3.5 - 5.1 mmol/L 4.5  3.8  4.1   Chloride 98 - 111 mmol/L 101  101  105   CO2 22 - 32 mmol/L 29  26  27    Calcium 8.9 - 10.3 mg/dL 9.0  9.2  9.0      Scheduled Meds:  Carbidopa -Levodopa  ER  2 capsule Oral 5 X Daily   glycopyrrolate  0.5 mg Oral Daily   levothyroxine   50 mcg Oral QAC breakfast   mirabegron ER  25 mg Oral Daily   Pimavanserin  Tartrate  1 capsule Oral Daily   rivastigmine   9.5 mg Transdermal Daily   sertraline   50 mg Oral Daily   traZODone   50 mg Oral QHS   Continuous Infusions:  Time 46  Jai-Gurmukh Hallelujah Wysong, MD  [facility]

## 2023-09-24 NOTE — Evaluation (Signed)
 Physical Therapy Evaluation Patient Details Name: Kelly Howard MRN: [id number] DOB: 03-23-1951 Today's Date: 09/24/2023  History of Present Illness  73 y/o F presenting to ED on 4/25 after mechanical fall landing on L side, CT with acute comminuted fx of L iliac wing,     PMH includes parkinson's disease, hypothyroidism, osteoporosis, falls, lewy body dementia, L clavicle fx  Clinical Impression  Pt admitted with above diagnosis. Lives at home with husband, in a two-level home (stays downstairs) with a level entry steps to enter; Prior to admission, pt was able to walk household distances without assistive device; has a PCA 5 hours/day, 5 days/week; Presents to PT with pain in L hip, upper LE which limits activity tolerance, dyskinesia related to Parkinson's Disease;  Pt currently needing up to mod A for ADLs, min-mod A +2 for transfers with RW. Pt with incr pain in L hip/LLE with pivot transfer to/from [facility]. In considering options for discharge, I value going back to familiar environment, caregivers, and routines for patients with dementia.   Pt with functional limitations due to the deficits listed below (see PT Problem List). Pt will benefit from skilled PT to increase their independence and safety with mobility to allow discharge to the venue listed below.           If plan is discharge home, recommend the following: A lot of help with walking and/or transfers;A lot of help with bathing/dressing/bathroom   Can travel by private vehicle        Equipment Recommendations Rolling walker (2 wheels);BSC/3in1  Recommendations for Other Services       Functional Status Assessment Patient has had a recent decline in their functional status and demonstrates the ability to make significant improvements in function in a reasonable and predictable amount of time.     Precautions / Restrictions Precautions Precautions: Fall Restrictions Weight Bearing Restrictions Per Provider Order:  Yes RLE Weight Bearing Per Provider Order: Weight bearing as tolerated LLE Weight Bearing Per Provider Order: Weight bearing as tolerated      Mobility  Bed Mobility               General bed mobility comments: in recliner upon arrival and departure    Transfers Overall transfer level: Needs assistance Equipment used: Rolling walker (2 wheels) Transfers: Sit to/from Stand Sit to Stand: Min assist, Mod assist, +2 physical assistance           General transfer comment: Cues for hand placement and safety; pt with dyskinetic  movements during transfers as well; difficulty accepting weight onto LLE at times; stood from recliner twice, and performed step pivot transfers with rW to [facility] adn BSC back to recliner    Ambulation/Gait                  Stairs            Wheelchair Mobility     Tilt Bed    Modified Rankin (Stroke Patients Only)       Balance Overall balance assessment: Needs assistance Sitting-balance support: Feet supported Sitting balance-Leahy Scale: Fair     Standing balance support: Reliant on assistive device for balance, During functional activity Standing balance-Leahy Scale: Poor Standing balance comment: reliant on external support                             Pertinent Vitals/Pain Pain Assessment Pain Assessment: Faces Pain Score: 7  Faces Pain Scale:  Hurts whole lot Pain Location: L hip Pain Descriptors / Indicators: Discomfort Pain Intervention(s): Limited activity within patient's tolerance, Patient requesting pain meds-RN notified    Home Living Family/patient expects to be discharged to:: Private residence Living Arrangements: Spouse/significant other Available Help at Discharge: Family;Personal care attendant;Available 24 hours/day Type of Home: House Home Access: Level entry       Home Layout: Two level;Able to live on main level with bedroom/bathroom Home Equipment: Rolling Walker (2 wheels);Cane -  single point;Shower seat - built in;Grab bars - toilet;Grab bars - tub/shower Additional Comments: aide 5 days/week from 9am-2pm    Prior Function Prior Level of Function : Needs assist;History of Falls (last six months)             Mobility Comments: PRN use of RW ADLs Comments: assist for ADLs, IADLs     Extremity/Trunk Assessment   Upper Extremity Assessment Upper Extremity Assessment: Defer to OT evaluation    Lower Extremity Assessment Lower Extremity Assessment: Generalized weakness;LLE deficits/detail (Dyskinetic movements) LLE Deficits / Details: Notably increased pain, especially with standing and transfers    Cervical / Trunk Assessment Cervical / Trunk Assessment: Kyphotic  Communication   Communication Communication: No apparent difficulties    Cognition Arousal: Alert Behavior During Therapy: WFL for tasks assessed/performed                             Following commands: Impaired Following commands impaired: Follows one step commands with increased time     Cueing Cueing Techniques: Verbal cues, Gestural cues     General Comments General comments (skin integrity, edema, etc.): VSS; daughter present and helpful    Exercises     Assessment/Plan    PT Assessment Patient needs continued PT services  PT Problem List Decreased strength;Decreased activity tolerance;Decreased balance;Decreased mobility;Decreased coordination;Decreased cognition;Decreased knowledge of use of DME;Decreased safety awareness;Decreased knowledge of precautions;Impaired tone;Pain       PT Treatment Interventions DME instruction;Gait training;Stair training;Functional mobility training;Therapeutic activities;Therapeutic exercise;Balance training;Neuromuscular re-education;Cognitive remediation;Patient/family education;Wheelchair mobility training;Manual techniques    PT Goals (Current goals can be found in the Care Plan section)  Acute Rehab PT Goals Patient  Stated Goal: Did not specfically state PT Goal Formulation: With patient/family Time For Goal Achievement: 10/08/23 Potential to Achieve Goals: Good    Frequency Min 3X/week     Co-evaluation               AM-PAC PT "6 Clicks" Mobility  Outcome Measure Help needed turning from your back to your side while in a flat bed without using bedrails?: A Little Help needed moving from lying on your back to sitting on the side of a flat bed without using bedrails?: A Lot Help needed moving to and from a bed to a chair (including a wheelchair)?: A Lot Help needed standing up from a chair using your arms (e.g., wheelchair or bedside chair)?: A Lot Help needed to walk in hospital room?: A Lot Help needed climbing 3-5 steps with a railing? : A Lot 6 Click Score: 13    End of Session Equipment Utilized During Treatment: Gait belt Activity Tolerance: Patient tolerated treatment well Patient left: in chair;with call bell/phone within reach;with chair alarm set;with family/visitor present;with nursing/sitter in room Nurse Communication: Mobility status;Patient requests pain meds PT Visit Diagnosis: Unsteadiness on feet (R26.81);Repeated falls (R29.6);Other symptoms and signs involving the nervous system (R29.898);Ataxic gait (R26.0);Muscle weakness (generalized) (M62.81);Pain Pain - Right/Left: Left Pain - part  of body: Hip    Time: 1191-4782 PT Time Calculation (min) (ACUTE ONLY): 26 min   Charges:   PT Evaluation $PT Eval Moderate Complexity: 1 Mod   PT General Charges $$ ACUTE PT VISIT: 1 Visit         Darcus Eastern, PT  Acute Rehabilitation Services Office [phone] Secure Chat welcomed   Marcial Setting 09/24/2023, 4:19 PM

## 2023-09-25 DIAGNOSIS — S32302A Unspecified fracture of left ilium, initial encounter for closed fracture: Secondary | ICD-10-CM | POA: Diagnosis not present

## 2023-09-25 NOTE — Progress Notes (Signed)
 RE:  Kelly Howard       Date of Birth: 08/23/1950      Date:   09/25/23       To Whom It May Concern:  Please be advised that the above-named patient will require a short-term nursing home stay - anticipated 30 days or less for rehabilitation and strengthening.  The plan is for return home.                 MD signature                Date

## 2023-09-25 NOTE — TOC Initial Note (Addendum)
 Transition of Care [facility]) - Initial/Assessment Note    Patient Details  Name: Kelly Howard MRN: [id number] Date of Birth: November 26, 1950  Transition of [facility]) CM/SW Contact:    Elspeth Hals, LCSW Phone Number: 09/25/2023, 4:10 PM  Clinical Narrative:     Pt oriented x2, CSW spoke with husband Drexel Gentles.  He was upset about some care issues on the floor, asking to speak with someone, unit director notified. PT recommendation for SNF discussed and he is agreeable to this, medicare choice document provided.  [facility] was first choice but they do not accept [employer].  Wants higher rated facilities, does not want [facility].  Permission given to send out referral in hub.  Pt from home with husband, [facility] aide in the home, unsure how often.  Referral sent out in hub for SNF. [employer]SR requires additional info.               Expected Discharge Plan: Skilled Nursing Facility Barriers to Discharge: Continued Medical Work up, SNF Pending bed offer   Patient Goals and CMS Choice   CMS Medicare.gov Compare Post Acute Care list provided to:: Patient Represenative (must comment) (husband Drexel Gentles) Choice offered to / list presented to : Spouse      Expected Discharge Plan and Services In-house Referral: Clinical Social Work   Post Acute Care Choice: Skilled Nursing Facility Living arrangements for the past 2 months: Single Family Home                                      Prior Living Arrangements/Services Living arrangements for the past 2 months: Single Family Home Lives with:: Spouse Patient language and need for interpreter reviewed:: Yes        Need for Family Participation in Patient Care: Yes (Comment) Care giver support system in place?: Yes (comment) Current home services: Homehealth aide Criminal Activity/Legal Involvement Pertinent to Current Situation/Hospitalization: No - Comment as needed  Activities of Daily Living   ADL Screening (condition at time of  admission) Independently performs ADLs?: No Does the patient have a NEW difficulty with bathing/dressing/toileting/self-feeding that is expected to last >3 days?: No Does the patient have a NEW difficulty with getting in/out of bed, walking, or climbing stairs that is expected to last >3 days?: No Does the patient have a NEW difficulty with communication that is expected to last >3 days?: No Is the patient deaf or have difficulty hearing?: No Does the patient have difficulty seeing, even when wearing glasses/contacts?: No Does the patient have difficulty concentrating, remembering, or making decisions?: Yes  Permission Sought/Granted                  Emotional Assessment Appearance:: Appears stated age Attitude/Demeanor/Rapport: Engaged Affect (typically observed): Pleasant Orientation: : Oriented to Self, Oriented to Place      Admission diagnosis:  Fall, initial encounter [W19.XXXA] Closed nondisplaced avulsion fracture of left ilium, initial encounter (HCC) [S32.315A] Closed fracture of left iliac crest, initial encounter [facility]) [S32.302A] Patient Active Problem List   Diagnosis Date Noted   Closed fracture of left iliac crest, initial encounter (HCC) 09/22/2023   Syncope 03/26/2023   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   Vitamin D  deficiency 07/10/2019   Vitamin B12 deficiency 07/10/2019   Hypothyroidism    Frequent falls    Osteoporosis    Closed fracture of right distal radius 12/25/2018   Parkinson disease (  HCC) 10/20/2011   PCP:  Zilphia Hilt, Charyl Coppersmith, MD Pharmacy:   [facility] [phone] - [address] [place] - [address] [place] [address] Phone: [phone] Fax: [phone]     Social Drivers of Health (SDOH) Social History: SDOH Screenings   Food Insecurity: No Food Insecurity (09/22/2023)  Housing: Low Risk  (09/22/2023)  Transportation Needs: No Transportation Needs (09/22/2023)  Utilities: Not At Risk  (09/22/2023)  Alcohol Screen: Low Risk  (09/14/2022)  Depression (PHQ2-9): Medium Risk (03/22/2023)  Financial Resource Strain: Low Risk  (09/14/2022)  Physical Activity: Insufficiently Active (09/14/2022)  Social Connections: Moderately Isolated (09/22/2023)  Stress: No Stress Concern Present (09/14/2022)  Tobacco Use: Low Risk  (09/22/2023)   SDOH Interventions:     Readmission Risk Interventions     No data to display

## 2023-09-25 NOTE — Progress Notes (Signed)
 Physical Therapy Treatment Patient Details Name: Kelly Howard MRN: [id number] DOB: [birth year] Today's Date: 09/25/2023   History of Present Illness 73 y/o F presenting to ED on 4/25 after mechanical fall landing on L side, CT with acute comminuted fx of L iliac wing,     PMH includes parkinson's disease, hypothyroidism, osteoporosis, falls, lewy body dementia, L clavicle fx    PT Comments  Continuing work on functional mobility and activity tolerance;  Initiated session with the goal of attempting a set of orthostatic vitals, however Stephenie Einstein was in too much pain to be able to participate; after getting supine BP, attempts to move to EOB were met with significant incr pain, and we opted to try and use the bed to chair position feature to see about a BP in a more upright sitting position; difficulty tolerating much movement in the bed, so assisted pt into as comfortable position as possible, and applied ice to hip;   Noted in Dr. Velda Gerlach note that pt's husband is concerned re: his ability to safely care for pt at home; While I tend to favor home for pt's with dementia, ultimately her safety is most important, and I will update dc recs; Patient will benefit from continued inpatient follow up therapy, <3 hours/day to maximize independence and safety with mobility and ADLs, and decr caregiver burden   If plan is discharge home, recommend the following: A lot of help with walking and/or transfers;A lot of help with bathing/dressing/bathroom   Can travel by private vehicle     No  Equipment Recommendations  Rolling walker (2 wheels);BSC/3in1    Recommendations for Other Services       Precautions / Restrictions Precautions Precautions: Fall Restrictions RLE Weight Bearing Per Provider Order: Weight bearing as tolerated LLE Weight Bearing Per Provider Order: Weight bearing as tolerated     Mobility  Bed Mobility Overal bed mobility: Needs Assistance             General bed  mobility comments: Total assist of 2 to slide pt up towards [facility] for better poitioning; attemtped to place bed in semi-chair position, however too painful    Transfers                   General transfer comment: Too painful    Ambulation/Gait               General Gait Details: Too painful   Stairs             Wheelchair Mobility     Tilt Bed    Modified Rankin (Stroke Patients Only)       Balance                                            Communication Communication Communication: Impaired Factors Affecting Communication: Difficulty expressing self  Cognition Arousal: Alert Behavior During Therapy: Anxious, Restless   PT - Cognitive impairments: History of cognitive impairments, Orientation   Orientation impairments: Place, Time, Situation                   PT - Cognition Comments: Oriented to self only Following commands: Impaired Following commands impaired: Only follows one step commands consistently    Cueing Cueing Techniques: Verbal cues, Gestural cues  Exercises      General Comments General comments (skin integrity, edema, etc.): BP 121/80, HR  91 towards the end of session; taken in bed, HOB slightly elevated      Pertinent Vitals/Pain Pain Assessment Pain Assessment: Faces Faces Pain Scale: Hurts worst Pain Location: L hip Pain Descriptors / Indicators: Crying, Grimacing, Guarding Pain Intervention(s): Monitored during session, Limited activity within patient's tolerance, Ice applied    Home Living                          Prior Function            PT Goals (current goals can now be found in the care plan section) Acute Rehab PT Goals Patient Stated Goal: Decrease pain PT Goal Formulation: With patient/family Time For Goal Achievement: 10/08/23 Potential to Achieve Goals: Good Progress towards PT goals: Not progressing toward goals - comment (Too painful today)     Frequency    Min 3X/week      PT Plan      Co-evaluation              AM-PAC PT "6 Clicks" Mobility   Outcome Measure  Help needed turning from your back to your side while in a flat bed without using bedrails?: A Lot Help needed moving from lying on your back to sitting on the side of a flat bed without using bedrails?: A Lot Help needed moving to and from a bed to a chair (including a wheelchair)?: A Lot Help needed standing up from a chair using your arms (e.g., wheelchair or bedside chair)?: A Lot Help needed to walk in hospital room?: A Lot Help needed climbing 3-5 steps with a railing? : Total 6 Click Score: 11    End of Session   Activity Tolerance: Patient limited by pain Patient left: in bed;with call bell/phone within reach;with bed alarm set Nurse Communication: Mobility status;Patient requests pain meds PT Visit Diagnosis: Unsteadiness on feet (R26.81);Repeated falls (R29.6);Other symptoms and signs involving the nervous system (R29.898);Ataxic gait (R26.0);Muscle weakness (generalized) (M62.81);Pain Pain - Right/Left: Left Pain - part of body: Hip     Time: 1012-1030 PT Time Calculation (min) (ACUTE ONLY): 18 min  Charges:    $Therapeutic Activity: 8-22 mins PT General Charges $$ ACUTE PT VISIT: 1 Visit                     Darcus Eastern, PT  Acute Rehabilitation Services Office [phone] Secure Chat welcomed    Marcial Setting 09/25/2023, 1:07 PM

## 2023-09-25 NOTE — Progress Notes (Signed)
 [facility] ROUNDING NOTE Prem Moyeda VHQ:[id number]  DOB: 1950/07/21  DOA: 09/22/2023  PCP: Zilphia Hilt, Charyl Coppersmith, MD  09/25/2023,7:59 AM  LOS: 3 days    Code Status: Full code   from: Home current Dispo: Unclear   73 year old white female with Parkinson's disease Lewy body dementia follows with prior left clavicular fracture 03/21/2023 10/27 admission syncope with orthostasis likely from Parkinson's hypothyroid etc.-previous arthritis of hip with injections--was supposed to be considering hip replacement 4/25 came to ED with mechanical fall landing on left side GCS 15-sodium 139 potassium 4.1 BUN/creatinine 12/0.6, WBC 6.9 hemoglobin 11 platelet 274 CXR no abnormality hip x-ray no acute fracture or dislocation arthritic changes left hip-CT pelvis acute long truly oriented mildly comminuted fracture left iliac wing uterine fibroid 2.1 cm diverticulosis no diverticulitis  Plan  Comminuted left iliac bone fracture-pain control Norco 1 every 6 as needed Nonoperative candidate--- can be WBAT based on discussion with Ortho trauma 4/26 Husband cannot take care of patient at home-requesting skilled placement  Orthostatic hypotension--h/o dysautonomia/ Parkinson's Difficult to treat-add abdominal binder Using home dose pimavanserin  34-continue carbidopa  2 tabs 5 times daily home schedule Has excessive salivation status post Botox ~ 4/24-working on glycopyrrolate wean and discontinuation because this causes confusion  Metabolic encephalopathy from Lewy body dementia Safety sitter, Seroquel 25 at bedtime, as needed Ativan 0.5 every ordered 4/27 continue Exelon  patch although risk of falls with this medication  4 Parkinson's related mood disorder continue Zoloft  50, trazodone  50  Hypothyroidism continue same Infected  DVT prophylaxis: SCD  Status is: Inpatient Remains inpatient appropriate because:   Await skilled facility assessment conducted care patient   Subjective:  Confused speech  somewhat unintelligible-says she has sore throat No chest pain--- night seems to been somewhat uneventful  Objective + exam Vitals:   09/24/23 0805 09/24/23 1527 09/24/23 1925 09/25/23 0616  BP: 125/70 (!) 103/59 (!) 113/56 (!) 119/50  Pulse: 89 72  82  Resp: 16 17 14 16   Temp: 98 F (36.7 C) 97.8 F (36.6 C) 97.7 F (36.5 C) (!) 97.5 F (36.4 C)  TempSrc: Oral Oral    SpO2: 99% 97% 96% 99%  Weight:      Height:       Filed Weights   09/22/23 0935  Weight: 46.3 kg    Examination:  Throat examined no erythema-S1-S2 no murmur Frail Chest is clear wheeze rales rhonchi Straight leg raise deferred  Data Reviewed: reviewed   CBC    Component Value Date/Time   WBC 8.0 09/24/2023 0602   RBC 4.13 09/24/2023 0602   HGB 11.9 (L) 09/24/2023 0602   HCT 36.5 09/24/2023 0602   PLT 247 09/24/2023 0602   MCV 88.4 09/24/2023 0602   MCH 28.8 09/24/2023 0602   MCHC 32.6 09/24/2023 0602   RDW 13.2 09/24/2023 0602   LYMPHSABS 0.7 09/24/2023 0602   MONOABS 0.9 09/24/2023 0602   EOSABS 0.1 09/24/2023 0602   BASOSABS 0.0 09/24/2023 0602      Latest Ref Rng & Units 09/24/2023    6:02 AM 09/23/2023    7:04 AM 09/22/2023   10:15 AM  CMP  Glucose 70 - 99 mg/dL 413  96  88   BUN 8 - 23 mg/dL 14  16  12    Creatinine 0.44 - 1.00 mg/dL 2.44  0.10  2.72   Sodium 135 - 145 mmol/L 139  139  139   Potassium 3.5 - 5.1 mmol/L 4.5  3.8  4.1   Chloride 98 -  111 mmol/L 101  101  105   CO2 22 - 32 mmol/L 29  26  27    Calcium 8.9 - 10.3 mg/dL 9.0  9.2  9.0     Scheduled Meds:  Carbidopa -Levodopa  ER  2 capsule Oral 5 X Daily   glycopyrrolate  0.5 mg Oral Daily   levothyroxine   50 mcg Oral QAC breakfast   mirabegron ER  25 mg Oral Daily   Pimavanserin  Tartrate  1 capsule Oral Daily   QUEtiapine  25 mg Oral QHS   rivastigmine   9.5 mg Transdermal Daily   sertraline   50 mg Oral Daily   traZODone   50 mg Oral QHS   Continuous Infusions:  Time 26  Jai-Gurmukh Charlett Merkle, MD  [facility]

## 2023-09-25 NOTE — Progress Notes (Signed)
 Patient's 10 am Carbidopa -Levodopa  (home medication) is in patient's medication bin in room Pyxis B.

## 2023-09-25 NOTE — Care Management Important Message (Signed)
 Important Message  Patient Details  Name: Kelly Howard MRN: [id number] Date of Birth: 06/20/1950   Important Message Given:  Yes - Medicare IM     Felix Host 09/25/2023, 2:39 PM

## 2023-09-25 NOTE — Plan of Care (Signed)

## 2023-09-25 NOTE — NC FL2 (Signed)
 [place]  MEDICAID FL2 LEVEL OF CARE FORM     IDENTIFICATION  Patient Name: Kelly Howard Birthdate: [birth year] Sex: female Admission Date (Current Location): 09/22/2023  [facility] and [place] Number:  Producer, television/film/video and Address:  [facility], [address] [place], [place] [address]      Provider Number: [id number]  Attending Physician Name and Address:  Verlie Glisson, MD  Relative Name and Phone Number:  MIEISHA, STOTT [phone]  [phone]    Current Level of Care: Hospital Recommended Level of Care: Skilled Nursing Facility Prior Approval Number:    Date Approved/Denied:   PASRR Number:    Discharge Plan: SNF    Current Diagnoses: Patient Active Problem List   Diagnosis Date Noted   Closed fracture of left iliac crest, initial encounter (HCC) 09/22/2023   Syncope 03/26/2023   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   Vitamin D  deficiency 07/10/2019   Vitamin B12 deficiency 07/10/2019   Hypothyroidism    Frequent falls    Osteoporosis    Closed fracture of right distal radius 12/25/2018   Parkinson disease (HCC) 10/20/2011    Orientation RESPIRATION BLADDER Height & Weight     Self, Place  Normal Incontinent Weight: 102 lb (46.3 kg) Height:  5\' 6"  (167.6 cm)  BEHAVIORAL SYMPTOMS/MOOD NEUROLOGICAL BOWEL NUTRITION STATUS      Continent Diet (see discharge summary)  AMBULATORY STATUS COMMUNICATION OF NEEDS Skin   Total Care Verbally Normal                       Personal Care Assistance Level of Assistance  Bathing, Feeding, Dressing Bathing Assistance: Limited assistance Feeding assistance: Limited assistance Dressing Assistance: Limited assistance     Functional Limitations Info  Sight, Hearing, Speech Sight Info: Adequate Hearing Info: Adequate Speech Info: Adequate    SPECIAL CARE FACTORS FREQUENCY  PT (By licensed PT), OT (By licensed OT)     PT Frequency: 5x week OT  Frequency: 5x week            Contractures Contractures Info: Not present    Additional Factors Info  Code Status, Allergies Code Status Info: full Allergies Info: Codeine, Escitalopram, Pramipexole, Propoxyphene           Current Medications (09/25/2023):  This is the current hospital active medication list Current Facility-Administered Medications  Medication Dose Route Frequency Provider Last Rate Last Admin   Carbidopa -Levodopa  ER 48.75-195 MG CPCR 2 capsule (Rytary  ER) HOME MEDICATION  2 capsule Oral 5 X Daily Samtani, Jai-Gurmukh, MD   2 capsule at 09/25/23 1434   glycopyrrolate (ROBINUL) tablet 0.5 mg  0.5 mg Oral Daily Samtani, Jai-Gurmukh, MD   0.5 mg at 09/25/23 1040   HYDROcodone-acetaminophen  (NORCO/VICODIN) 5-325 MG per tablet 1 tablet  1 tablet Oral Q6H PRN Melvin, Alexander B, MD   1 tablet at 09/25/23 1039   HYDROmorphone  (DILAUDID ) injection 0.5 mg  0.5 mg Intravenous Q3H PRN Johnetta Nab, MD   0.5 mg at 09/24/23 1839   levothyroxine  (SYNTHROID ) tablet 50 mcg  50 mcg Oral QAC breakfast Johnetta Nab, MD   50 mcg at 09/25/23 0607   LORazepam (ATIVAN) injection 0.5 mg  0.5 mg Intravenous Q6H PRN Samtani, Jai-Gurmukh, MD       LORazepam (ATIVAN) tablet 0.5 mg  0.5 mg Oral Q6H PRN Samtani, Jai-Gurmukh, MD   0.5 mg at 09/25/23 1039   mirabegron ER (MYRBETRIQ) tablet 25 mg  25  mg Oral Daily Melvin, Alexander B, MD   25 mg at 09/25/23 1040   Pimavanserin  Tartrate CAPS 34 mg  1 capsule Oral Daily Melvin, Alexander B, MD   34 mg at 09/25/23 1041   polyethylene glycol (MIRALAX / GLYCOLAX) packet 17 g  17 g Oral Daily PRN Melvin, Alexander B, MD       QUEtiapine (SEROQUEL) tablet 25 mg  25 mg Oral QHS Samtani, Jai-Gurmukh, MD   25 mg at 09/24/23 2207   rivastigmine  (EXELON ) 9.5 mg/24hr 9.5 mg  9.5 mg Transdermal Daily Melvin, Alexander B, MD   9.5 mg at 09/25/23 1040   sertraline  (ZOLOFT ) tablet 50 mg  50 mg Oral Daily Melvin, Alexander B, MD   50 mg at 09/25/23  1039   traZODone  (DESYREL ) tablet 50 mg  50 mg Oral QHS Melvin, Alexander B, MD   50 mg at 09/24/23 2207     Discharge Medications: Please see discharge summary for a list of discharge medications.  Relevant Imaging Results:  Relevant Lab Results:   Additional Information SSN: [id number]  Elspeth Hals, LCSW

## 2023-09-26 DIAGNOSIS — S32302A Unspecified fracture of left ilium, initial encounter for closed fracture: Secondary | ICD-10-CM | POA: Diagnosis not present

## 2023-09-26 MED ORDER — OLANZAPINE 5 MG PO TBDP: 5.0000 mg | ORAL_TABLET | Freq: Every day | ORAL | 0 refills | Status: DC

## 2023-09-26 MED ORDER — SERTRALINE HCL 100 MG PO TABS: ORAL_TABLET | ORAL | 3 refills | Status: DC

## 2023-09-26 MED ORDER — MENTHOL 3 MG MT LOZG
1.0000 | LOZENGE | OROMUCOSAL | Status: DC | PRN
  Filled 2023-09-26: qty 9

## 2023-09-26 MED ORDER — QUETIAPINE FUMARATE 25 MG PO TABS: 25.0000 mg | ORAL_TABLET | Freq: Every day | ORAL | 0 refills | Status: DC

## 2023-09-26 MED ORDER — OLANZAPINE 5 MG PO TBDP
5.0000 mg | ORAL_TABLET | Freq: Every day | ORAL | Status: DC
  Administered 2023-09-26 – 2023-09-27 (×2): 5 mg via ORAL
  Filled 2023-09-26 (×3): qty 1

## 2023-09-26 MED ORDER — TRAZODONE HCL 50 MG PO TABS
ORAL_TABLET | ORAL | 3 refills | Status: DC
Start: 2023-09-26 — End: 2024-03-31

## 2023-09-26 MED ORDER — SERTRALINE HCL 100 MG PO TABS
100.0000 mg | ORAL_TABLET | Freq: Every day | ORAL | Status: DC
  Administered 2023-09-27: 100 mg via ORAL
  Filled 2023-09-26: qty 1

## 2023-09-26 MED ORDER — ACETAMINOPHEN 500 MG PO TABS: 650.0000 mg | ORAL_TABLET | Freq: Four times a day (QID) | ORAL | Status: DC

## 2023-09-26 MED ORDER — HYDROCODONE-ACETAMINOPHEN 5-325 MG PO TABS: 1.0000 | ORAL_TABLET | Freq: Four times a day (QID) | ORAL | 0 refills | Status: DC | PRN

## 2023-09-26 MED ORDER — ACETAMINOPHEN 500 MG PO TABS: 1000.0000 mg | ORAL_TABLET | Freq: Four times a day (QID) | ORAL | Status: DC | PRN

## 2023-09-26 NOTE — Progress Notes (Signed)
 Seen Quite anxious--added zydis odt for now--prefer to avoid meds by IV route Thinks she needs to go to the airport. Asking for husband Trying to get oob  D/c ready, tomorrow paperwork to be updated by attending.  Jai Desarae Placide, MD [facility] 3:03 PM

## 2023-09-26 NOTE — Progress Notes (Signed)
 Mobility Specialist: Progress Note   09/26/23 1127  Mobility  Activity Ambulated with assistance in room;Transferred from bed to chair  Level of Assistance Minimal assist, patient does 75% or more  Assistive Device Front wheel walker  Distance Ambulated (ft) 3 ft  RLE Weight Bearing Per Provider Order WBAT  LLE Weight Bearing Per Provider Order WBAT  Activity Response Tolerated well  Mobility Referral Yes  Mobility visit 1 Mobility  Mobility Specialist Start Time (ACUTE ONLY) 1050  Mobility Specialist Stop Time (ACUTE ONLY) 1104  Mobility Specialist Time Calculation (min) (ACUTE ONLY) 14 min    Pt received in bed agreeable to mobility session. Caretaker present and helpful throughout. MinA for bed mobility to assist BLEs, pt needed a little assistance with static balance at EOB but eventually able to hold herself up. MinA to stand and take a couple steps forward and backward. Assistance needed for steadying. Distance limited d/t increase in L hip pain. Needed to sit back down on EOB almost immediately. Then agreed to sit up in the chair for awhile. Rolled the chair over to her side of the bed and pt performed a stand pivot MinA with HHA. Left in chair with all needs met, call bell in reach. Chair alarm on.   Deloria Fetch Mobility Specialist Please contact via SecureChat or Rehab office at [phone]

## 2023-09-26 NOTE — TOC Progression Note (Addendum)
 Transition of Care [facility]) - Progression Note    Patient Details  Name: Kelly Howard MRN: [id number] Date of Birth: Nov 18, 1950  Transition of [facility]) CM/SW Contact  Elspeth Hals, LCSW Phone Number: 09/26/2023, 11:49 AM  Clinical Narrative:   Bed offers provided to pt husband. He will review.   1450: Husband would like to accept offer [employer].  CSW confirmed with [facility]: bed available tomorrow.  PASSR docs uploaded in [facility].   Auth request submitted in Navi and approved: [id number], 3 days: 4/30-5/2.  PASSR received: [id number] E.  Expected Discharge Plan: Skilled Nursing Facility Barriers to Discharge: Continued Medical Work up, SNF Pending bed offer  Expected Discharge Plan and Services In-house Referral: Clinical Social Work   Post Acute Care Choice: Skilled Nursing Facility Living arrangements for the past 2 months: Single Family Home                                       Social Determinants of Health (SDOH) Interventions SDOH Screenings   Food Insecurity: No Food Insecurity (09/22/2023)  Housing: Low Risk  (09/22/2023)  Transportation Needs: No Transportation Needs (09/22/2023)  Utilities: Not At Risk (09/22/2023)  Alcohol Screen: Low Risk  (09/14/2022)  Depression (PHQ2-9): Medium Risk (03/22/2023)  Financial Resource Strain: Low Risk  (09/14/2022)  Physical Activity: Insufficiently Active (09/14/2022)  Social Connections: Moderately Isolated (09/22/2023)  Stress: No Stress Concern Present (09/14/2022)  Tobacco Use: Low Risk  (09/22/2023)    Readmission Risk Interventions     No data to display

## 2023-09-26 NOTE — Plan of Care (Signed)
   Problem: Clinical Measurements: Goal: Ability to maintain clinical measurements within normal limits will improve Outcome: Progressing

## 2023-09-26 NOTE — Discharge Summary (Signed)
 Physician Discharge Summary  Kelly Howard ZOX:[id number] DOB: 03-23-1951 DOA: 09/22/2023  PCP: Zilphia Hilt, Charyl Coppersmith, MD  Admit date: 09/22/2023 Discharge date: 09/26/2023  Time spent: 33 minutes  Recommendations for Outpatient Follow-up:  Get Chem-12 CBC 1 week outpatient Interval follow-up orthopedics in the outpatient setting in 1 to 2 weeks-probably can follow-up with Ortho trauma Note new start of Zydis 5 daily, Seroquel 25 at bedtime for agitation in the setting of Parkinson's   Discharge Diagnoses:  MAIN problem for hospitalization   Fall with left iliac bone comminuted fracture and pain nonoperative candidate Parkinson's dysautonomia and Lewy body dementia  Please see below for itemized issues addressed in HOpsital- refer to other progress notes for clarity if needed  Discharge Condition: Improved  Diet recommendation: Regular  Filed Weights   09/22/23 0935  Weight: 46.3 kg    History of present illness:  73 year old white female with Parkinson's disease Lewy body dementia follows with prior left clavicular fracture 03/21/2023 10/27 admission syncope with orthostasis likely from Parkinson's hypothyroid etc.-previous arthritis of hip with injections--was supposed to be considering hip replacement 4/25 came to ED with mechanical fall landing on left side GCS 15-sodium 139 potassium 4.1 BUN/creatinine 12/0.6, WBC 6.9 hemoglobin 11 platelet 274 CXR no abnormality hip x-ray no acute fracture or dislocation arthritic changes left hip-CT pelvis acute long truly oriented mildly comminuted fracture left iliac wing uterine fibroid 2.1 cm diverticulosis no diverticulitis   Plan   Comminuted left iliac bone fracture-pain control Norco 1 every 6 as needed Nonoperative candidate--- can be WBAT based on discussion with Ortho trauma 4/26 Husband cannot take care of patient at home-requesting skilled placement-authorization is pending but patient is otherwise quite stable  physically for discharge   Orthostatic hypotension--h/o dysautonomia/ Parkinson's Difficult to treat-add abdominal binder Using home dose pimavanserin  34-continue carbidopa  2 tabs 5 times daily home schedule-needs to be given at prior schedule Has excessive salivation status post Botox ~ 4/24-working on glycopyrrolate wean and discontinuation because this causes confusion   Metabolic encephalopathy from Lewy body dementia Safety sitter, Seroquel 25 at bedtime, as needed Ativan 0.5 every ordered 4/27  continue Exelon  patch although risk of falls with this medication  Parkinson's related mood disorder continue Zoloft  50, trazodone  50 Added Zydis 4/29 5 mg ODT for dysphoria/confusion   Hypothyroidism continue same Replacing with Synthroid  50 outpatient TSH 3 to 4 weeks  Discharge Exam: Vitals:   09/26/23 0752 09/26/23 1413  BP: 102/64 119/62  Pulse: 76 68  Resp: 15 16  Temp: 97.9 F (36.6 C) 99.5 F (37.5 C)  SpO2: 99% 95%    Subj on day of d/c   Awake incoherent  General Exam on discharge  Eomi ncat no focal deficit Cta b Abd soft nt nd able to slr bilat Power 5/5   Discharge Instructions   Discharge Instructions     Diet - low sodium heart healthy   Complete by: As directed    Increase activity slowly   Complete by: As directed       Allergies as of 09/26/2023       Reactions   Codeine Other (See Comments)   GI Upset   Escitalopram Swelling, Other (See Comments)   Feet swell   Pramipexole    Other Reaction(s): Psychosis   Propoxyphene Other (See Comments)   Hallucinations        Medication List     STOP taking these medications    glycopyrrolate 1 MG tablet Commonly known as: ROBINUL  loratadine 10 MG tablet Commonly known as: CLARITIN       TAKE these medications    acetaminophen  500 MG tablet Commonly known as: TYLENOL  Take 1.5 tablets (750 mg total) by mouth every 6 (six) hours. What changed:  medication strength how much to  take when to take this reasons to take this   AMBULATORY NON FORMULARY MEDICATION Abdominal compression binder Dx: G20   cyanocobalamin  1000 MCG/ML injection Commonly known as: VITAMIN B12 Inject into the muscle every 30 (thirty) days.   HYDROcodone-acetaminophen  5-325 MG tablet Commonly known as: NORCO/VICODIN Take 1 tablet by mouth every 6 (six) hours as needed for moderate pain (pain score 4-6) or severe pain (pain score 7-10).   Inbrija  42 MG Caps Generic drug: Levodopa  Take 1 capsule by mouth daily as needed (mobility per spouse).   levothyroxine  50 MCG tablet Commonly known as: SYNTHROID  Take 50 mcg by mouth daily before breakfast.   Nuplazid  34 MG Caps Generic drug: Pimavanserin  Tartrate Take 1 capsule by mouth daily.   OLANZapine zydis 5 MG disintegrating tablet Commonly known as: ZYPREXA Take 1 tablet (5 mg total) by mouth daily.   Prolia 60 MG/ML Sosy injection Generic drug: denosumab Inject 60 mg into the skin every 6 (six) months.   QUEtiapine 25 MG tablet Commonly known as: SEROQUEL Take 1 tablet (25 mg total) by mouth at bedtime.   rivastigmine  9.5 mg/24hr Commonly known as: EXELON  Place 9.5 mg onto the skin daily.   Rytary  48.75-195 MG Cpcr Generic drug: Carbidopa -Levodopa  ER Take 10 tablets by mouth See admin instructions. Take 2 tablets by mouth at  6am, then take  2 tablets by mouth at 10am, then take 2 tablets by mouth  at 2pm, then take 2 tablets by mouth  at 6 pm, and  take 2 tablets by mouth  at 10 pm per spouse   sertraline  100 MG tablet Commonly known as: ZOLOFT  TAKE ONE TABLET DAILY.   traZODone  50 MG tablet Commonly known as: DESYREL  Take one to three tablets at bedtime. What changed:  how much to take how to take this when to take this additional instructions   Vibegron 75 MG Tabs Take 75 mg by mouth at bedtime.   VITAMIN D  PO Take 1 capsule by mouth daily.   Xeomin 50 units Solr injection Generic drug:  incobotulinumtoxinA Inject 50 Units into the muscle every 3 (three) months.       Allergies  Allergen Reactions   Codeine Other (See Comments)    GI Upset   Escitalopram Swelling and Other (See Comments)    Feet swell   Pramipexole     Other Reaction(s): Psychosis   Propoxyphene Other (See Comments)    Hallucinations      The results of significant diagnostics from this hospitalization (including imaging, microbiology, ancillary and laboratory) are listed below for reference.    Significant Diagnostic Studies: CT PELVIS WO CONTRAST Result Date: 09/22/2023 CLINICAL DATA:  Left hip pain after fall EXAM: CT PELVIS WITHOUT CONTRAST TECHNIQUE: Multidetector CT imaging of the pelvis was performed following the standard protocol without intravenous contrast. RADIATION DOSE REDUCTION: This exam was performed according to the departmental dose-optimization program which includes automated exposure control, adjustment of the mA and/or kV according to patient size and/or use of iterative reconstruction technique. COMPARISON:  09/22/2023 FINDINGS: Urinary Tract: The urinary bladder is distended without focal abnormality. Bowel: No small bowel wall thickening or inflammation. No small bowel obstruction. Normal appendix. Sigmoid diverticulosis. Vascular/Lymphatic: No aortic aneurysm.  No intraabdominal or pelvic lymphadenopathy. Reproductive: Age-related atrophy of the uterus and ovaries. No concerning adnexal mass. Mildly hyperdense structure along the anterior uterine body measuring 2.1 cm, probably a uterine fibroid.No free pelvic fluid. Other: No pneumoperitoneum, ascites, or mesenteric inflammation. Musculoskeletal: Diffuse osteopenia.Acute, longitudinal, mildly comminuted fracture of the left iliac wing. Severe osteoarthritis of the left hip with bone on bone articulation. Moderate right hip osteoarthritis. Degenerative disc disease at L4-L5 with lumbar facet arthropathy. Cortical screw and plate  fixation of the distal right radius. IMPRESSION: 1. Acute, longitudinally oriented, mildly comminuted, isolated fracture of the left iliac wing. 2. Sigmoid diverticulosis.  No changes of acute diverticulitis. 3. Likely uterine fibroid along the anterior uterus measuring 2.1 cm. The Electronically Signed   By: Rance Burrows M.D.   On: 09/22/2023 16:17   DG Chest 1 View Result Date: 09/22/2023 CLINICAL DATA:  Left hip pain following a fall. EXAM: CHEST  1 VIEW COMPARISON:  03/26/2023 FINDINGS: Normal sized heart. Atheromatous calcifications, including the coronary arteries and aorta. Clear lungs with normal vascularity. Old, healed right rib fractures. No acute fracture or pneumothorax. IMPRESSION: No acute abnormality. Electronically Signed   By: Catherin Closs M.D.   On: 09/22/2023 11:12   DG Hip Unilat With Pelvis 2-3 Views Left Result Date: 09/22/2023 CLINICAL DATA:  Fall and left hip pain. EXAM: DG HIP (WITH OR WITHOUT PELVIS) 2-3V LEFT COMPARISON:  Radiograph dated 06/14/2019. FINDINGS: There is no acute fracture or dislocation. The bones are osteopenic. Severe arthritic changes of the left hip with loss of joint space and bone-on-bone contact. The soft tissues are unremarkable. IMPRESSION: 1. No acute fracture or dislocation. 2. 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 Electronically Signed   By: Angus Bark M.D.   On: 09/22/2023 11:11    Microbiology: No results found for this or any previous visit (from the past 240 hours).   Labs: Basic Metabolic Panel: Recent Labs  Lab 09/22/23 1015 09/23/23 0704 09/24/23 0602  NA 139 139 139  K 4.1 3.8 4.5  CL 105 101 101  CO2 27 26 29   GLUCOSE 88 96 113*  BUN 12 16 14   CREATININE 0.62 0.60 0.59  CALCIUM 9.0 9.2 9.0   Liver Function Tests: No results for input(s): "AST", "ALT", "ALKPHOS", "BILITOT", "PROT", "ALBUMIN" in the last 168 hours. No results for input(s): "LIPASE", "AMYLASE" in the last 168 hours. No results for input(s):  "AMMONIA" in the last 168 hours. CBC: Recent Labs  Lab 09/22/23 1015 09/23/23 0704 09/24/23 0602  WBC 6.9 6.7 8.0  NEUTROABS 5.4  --  6.3  HGB 11.0* 11.3* 11.9*  HCT 34.7* 35.4* 36.5  MCV 91.3 88.9 88.4  PLT 274 263 247   Cardiac Enzymes: No results for input(s): "CKTOTAL", "CKMB", "CKMBINDEX", "TROPONINI" in the last 168 hours. BNP: BNP (last 3 results) No results for input(s): "BNP" in the last 8760 hours.  ProBNP (last 3 results) No results for input(s): "PROBNP" in the last 8760 hours.  CBG: No results for input(s): "GLUCAP" in the last 168 hours.  Signed:  Verlie Glisson MD   [facility] 09/26/2023, 3:08 PM

## 2023-09-27 DIAGNOSIS — N3281 Overactive bladder: Secondary | ICD-10-CM | POA: Diagnosis not present

## 2023-09-27 DIAGNOSIS — W19XXXD Unspecified fall, subsequent encounter: Secondary | ICD-10-CM | POA: Diagnosis not present

## 2023-09-27 DIAGNOSIS — M160 Bilateral primary osteoarthritis of hip: Secondary | ICD-10-CM | POA: Diagnosis not present

## 2023-09-27 DIAGNOSIS — I951 Orthostatic hypotension: Secondary | ICD-10-CM | POA: Diagnosis not present

## 2023-09-27 DIAGNOSIS — E559 Vitamin D deficiency, unspecified: Secondary | ICD-10-CM | POA: Diagnosis not present

## 2023-09-27 DIAGNOSIS — I959 Hypotension, unspecified: Secondary | ICD-10-CM | POA: Diagnosis not present

## 2023-09-27 DIAGNOSIS — F411 Generalized anxiety disorder: Secondary | ICD-10-CM | POA: Diagnosis not present

## 2023-09-27 DIAGNOSIS — G3183 Dementia with Lewy bodies: Secondary | ICD-10-CM | POA: Diagnosis not present

## 2023-09-27 DIAGNOSIS — M80052D Age-related osteoporosis with current pathological fracture, left femur, subsequent encounter for fracture with routine healing: Secondary | ICD-10-CM | POA: Diagnosis not present

## 2023-09-27 DIAGNOSIS — G4700 Insomnia, unspecified: Secondary | ICD-10-CM | POA: Diagnosis not present

## 2023-09-27 DIAGNOSIS — K5900 Constipation, unspecified: Secondary | ICD-10-CM | POA: Diagnosis not present

## 2023-09-27 DIAGNOSIS — G20C Parkinsonism, unspecified: Secondary | ICD-10-CM | POA: Diagnosis not present

## 2023-09-27 DIAGNOSIS — E039 Hypothyroidism, unspecified: Secondary | ICD-10-CM | POA: Diagnosis not present

## 2023-09-27 DIAGNOSIS — F0284 Dementia in other diseases classified elsewhere, unspecified severity, with anxiety: Secondary | ICD-10-CM | POA: Diagnosis not present

## 2023-09-27 DIAGNOSIS — S32302A Unspecified fracture of left ilium, initial encounter for closed fracture: Secondary | ICD-10-CM | POA: Diagnosis not present

## 2023-09-27 DIAGNOSIS — Z79899 Other long term (current) drug therapy: Secondary | ICD-10-CM | POA: Diagnosis not present

## 2023-09-27 DIAGNOSIS — G9341 Metabolic encephalopathy: Secondary | ICD-10-CM | POA: Diagnosis not present

## 2023-09-27 DIAGNOSIS — Z7401 Bed confinement status: Secondary | ICD-10-CM | POA: Diagnosis not present

## 2023-09-27 DIAGNOSIS — F0283 Dementia in other diseases classified elsewhere, unspecified severity, with mood disturbance: Secondary | ICD-10-CM | POA: Diagnosis not present

## 2023-09-27 DIAGNOSIS — F02818 Dementia in other diseases classified elsewhere, unspecified severity, with other behavioral disturbance: Secondary | ICD-10-CM | POA: Diagnosis not present

## 2023-09-27 DIAGNOSIS — F331 Major depressive disorder, recurrent, moderate: Secondary | ICD-10-CM | POA: Diagnosis not present

## 2023-09-27 DIAGNOSIS — G20A1 Parkinson's disease without dyskinesia, without mention of fluctuations: Secondary | ICD-10-CM | POA: Diagnosis not present

## 2023-09-27 MED ORDER — OLANZAPINE 5 MG PO TBDP: 5.0000 mg | ORAL_TABLET | Freq: Every day | ORAL | 0 refills | Status: DC

## 2023-09-27 MED ORDER — HYDROCODONE-ACETAMINOPHEN 5-325 MG PO TABS: 1.0000 | ORAL_TABLET | Freq: Four times a day (QID) | ORAL | 0 refills | Status: AC | PRN

## 2023-09-27 MED ORDER — QUETIAPINE FUMARATE 25 MG PO TABS: 25.0000 mg | ORAL_TABLET | Freq: Every day | ORAL | 0 refills | Status: DC

## 2023-09-27 NOTE — Progress Notes (Signed)
 RN made an outbound call to [facility] at 1140 and gave report to [place], Charity fundraiser. Patient is stable and awaiting PTAR for transport.

## 2023-09-27 NOTE — TOC Transition Note (Signed)
 Transition of Care [facility]) - Discharge Note   Patient Details  Name: Kelly Howard MRN: [id number] Date of Birth: Dec 08, 1950  [facility] CM/SW Contact:  Elspeth Hals, LCSW Phone Number: 09/27/2023, 10:41 AM   Clinical Narrative:   Pt discharging to [facility].  RN call report to [phone].  PTAR called [phone].  1000: CSW confirmed with [facility] that they can receive pt today.     Final next level of care: Skilled Nursing Facility Barriers to Discharge: Barriers Resolved   Patient Goals and CMS Choice   CMS Medicare.gov Compare Post Acute Care list provided to:: Patient Represenative (must comment) ([name]) Choice offered to / list presented to : Spouse      Discharge Placement              Patient chooses bed at: [facility] Patient to be transferred to facility by: ptar Name of family member notified: [name] Patient and family notified of of transfer: 09/27/23  Discharge Plan and Services Additional resources added to the After Visit Summary for   In-house Referral: Clinical Social Work   Post Acute Care Choice: Skilled Nursing Facility                               Social Drivers of Health (SDOH) Interventions SDOH Screenings   Food Insecurity: No Food Insecurity (09/22/2023)  Housing: Low Risk  (09/22/2023)  Transportation Needs: No Transportation Needs (09/22/2023)  Utilities: Not At Risk (09/22/2023)  Alcohol Screen: Low Risk  (09/14/2022)  Depression (PHQ2-9): Medium Risk (03/22/2023)  Financial Resource Strain: Low Risk  (09/14/2022)  Physical Activity: Insufficiently Active (09/14/2022)  Social Connections: Moderately Isolated (09/22/2023)  Stress: No Stress Concern Present (09/14/2022)  Tobacco Use: Low Risk  (09/22/2023)     Readmission Risk Interventions     No data to display

## 2023-09-27 NOTE — Discharge Summary (Signed)
 Physician Discharge Summary  Kelly Howard ZOX:[id number] DOB: 1951-04-13 DOA: 09/22/2023  PCP: Zilphia Hilt, Charyl Coppersmith, MD  Admit date: 09/22/2023 Discharge date: 09/27/2023  Time spent: 33 minutes  Recommendations for Outpatient Follow-up:  Get Chem-12 CBC 1 week outpatient Interval follow-up orthopedics in the outpatient setting in 1 to 2 weeks-probably can follow-up with Ortho trauma Note new start of Zydis 5 daily, Seroquel 25 at bedtime for agitation in the setting of Parkinson's   Discharge Diagnoses:  MAIN problem for hospitalization   Fall with left iliac bone comminuted fracture and pain nonoperative candidate Parkinson's dysautonomia and Lewy body dementia  Please see below for itemized issues addressed in HOpsital- refer to other progress notes for clarity if needed  Discharge Condition: Improved  Diet recommendation: Regular  Filed Weights   09/22/23 0935  Weight: 46.3 kg    History of present illness:  73 year old white female with Parkinson's disease Lewy body dementia follows with prior left clavicular fracture 03/21/2023 10/27 admission syncope with orthostasis likely from Parkinson's hypothyroid etc.-previous arthritis of hip with injections--was supposed to be considering hip replacement 4/25 came to ED with mechanical fall landing on left side GCS 15-sodium 139 potassium 4.1 BUN/creatinine 12/0.6, WBC 6.9 hemoglobin 11 platelet 274 CXR no abnormality hip x-ray no acute fracture or dislocation arthritic changes left hip-CT pelvis acute long truly oriented mildly comminuted fracture left iliac wing uterine fibroid 2.1 cm diverticulosis no diverticulitis   Plan   Comminuted left iliac bone fracture-pain control Norco 1 every 6 as needed Nonoperative candidate--- can be WBAT based on discussion with Ortho trauma 4/26 Husband cannot take care of patient at home-requesting skilled placement-authorization is pending but patient is otherwise quite stable  physically for discharge   Orthostatic hypotension--h/o dysautonomia/ Parkinson's Difficult to treat-add abdominal binder Using home dose pimavanserin  34-continue carbidopa  2 tabs 5 times daily home schedule-needs to be given at prior schedule Has excessive salivation status post Botox ~ 4/24-working on glycopyrrolate wean and discontinuation because this causes confusion   Metabolic encephalopathy from Lewy body dementia Safety sitter, Seroquel 25 at bedtime, as needed Ativan 0.5 every ordered 4/27  continue Exelon  patch although risk of falls with this medication  Parkinson's related mood disorder continue Zoloft  50, trazodone  50 Added Zydis 4/29 5 mg ODT for dysphoria/confusion   Hypothyroidism continue same Replacing with Synthroid  50 outpatient TSH 3 to 4 weeks  Discharge Exam: Vitals:   09/26/23 2008 09/27/23 0631  BP: 105/71 131/89  Pulse: 85 76  Resp: 20 17  Temp: 99.1 F (37.3 C) 99 F (37.2 C)  SpO2: 95% 97%    Subj on day of d/c   Awake incoherent  General Exam on discharge  Eomi ncat no focal deficit Cta b Abd soft nt nd able to slr bilat Power 5/5   Discharge Instructions   Discharge Instructions     Diet - low sodium heart healthy   Complete by: As directed    Increase activity slowly   Complete by: As directed       Allergies as of 09/27/2023       Reactions   Codeine Other (See Comments)   GI Upset   Escitalopram Swelling, Other (See Comments)   Feet swell   Pramipexole    Other Reaction(s): Psychosis   Propoxyphene Other (See Comments)   Hallucinations        Medication List     STOP taking these medications    glycopyrrolate 1 MG tablet Commonly known as: ROBINUL  loratadine 10 MG tablet Commonly known as: CLARITIN       TAKE these medications    acetaminophen  500 MG tablet Commonly known as: TYLENOL  Take 1.5 tablets (750 mg total) by mouth every 6 (six) hours. What changed:  medication strength how much to  take when to take this reasons to take this   AMBULATORY NON FORMULARY MEDICATION Abdominal compression binder Dx: G20   cyanocobalamin  1000 MCG/ML injection Commonly known as: VITAMIN B12 Inject into the muscle every 30 (thirty) days.   HYDROcodone-acetaminophen  5-325 MG tablet Commonly known as: NORCO/VICODIN Take 1 tablet by mouth every 6 (six) hours as needed for moderate pain (pain score 4-6) or severe pain (pain score 7-10).   Inbrija  42 MG Caps Generic drug: Levodopa  Take 1 capsule by mouth daily as needed (mobility per spouse).   levothyroxine  50 MCG tablet Commonly known as: SYNTHROID  Take 50 mcg by mouth daily before breakfast.   Nuplazid  34 MG Caps Generic drug: Pimavanserin  Tartrate Take 1 capsule by mouth daily.   OLANZapine zydis 5 MG disintegrating tablet Commonly known as: ZYPREXA Take 1 tablet (5 mg total) by mouth daily.   Prolia 60 MG/ML Sosy injection Generic drug: denosumab Inject 60 mg into the skin every 6 (six) months.   QUEtiapine 25 MG tablet Commonly known as: SEROQUEL Take 1 tablet (25 mg total) by mouth at bedtime.   rivastigmine  9.5 mg/24hr Commonly known as: EXELON  Place 9.5 mg onto the skin daily.   Rytary  48.75-195 MG Cpcr Generic drug: Carbidopa -Levodopa  ER Take 10 tablets by mouth See admin instructions. Take 2 tablets by mouth at  6am, then take  2 tablets by mouth at 10am, then take 2 tablets by mouth  at 2pm, then take 2 tablets by mouth  at 6 pm, and  take 2 tablets by mouth  at 10 pm per spouse   sertraline  100 MG tablet Commonly known as: ZOLOFT  TAKE ONE TABLET DAILY.   traZODone  50 MG tablet Commonly known as: DESYREL  Take one to three tablets at bedtime. What changed:  how much to take how to take this when to take this additional instructions   Vibegron 75 MG Tabs Take 75 mg by mouth at bedtime.   VITAMIN D  PO Take 1 capsule by mouth daily.   Xeomin 50 units Solr injection Generic drug:  incobotulinumtoxinA Inject 50 Units into the muscle every 3 (three) months.       Allergies  Allergen Reactions   Codeine Other (See Comments)    GI Upset   Escitalopram Swelling and Other (See Comments)    Feet swell   Pramipexole     Other Reaction(s): Psychosis   Propoxyphene Other (See Comments)    Hallucinations      The results of significant diagnostics from this hospitalization (including imaging, microbiology, ancillary and laboratory) are listed below for reference.    Significant Diagnostic Studies: CT PELVIS WO CONTRAST Result Date: 09/22/2023 CLINICAL DATA:  Left hip pain after fall EXAM: CT PELVIS WITHOUT CONTRAST TECHNIQUE: Multidetector CT imaging of the pelvis was performed following the standard protocol without intravenous contrast. RADIATION DOSE REDUCTION: This exam was performed according to the departmental dose-optimization program which includes automated exposure control, adjustment of the mA and/or kV according to patient size and/or use of iterative reconstruction technique. COMPARISON:  09/22/2023 FINDINGS: Urinary Tract: The urinary bladder is distended without focal abnormality. Bowel: No small bowel wall thickening or inflammation. No small bowel obstruction. Normal appendix. Sigmoid diverticulosis. Vascular/Lymphatic: No aortic aneurysm.  No intraabdominal or pelvic lymphadenopathy. Reproductive: Age-related atrophy of the uterus and ovaries. No concerning adnexal mass. Mildly hyperdense structure along the anterior uterine body measuring 2.1 cm, probably a uterine fibroid.No free pelvic fluid. Other: No pneumoperitoneum, ascites, or mesenteric inflammation. Musculoskeletal: Diffuse osteopenia.Acute, longitudinal, mildly comminuted fracture of the left iliac wing. Severe osteoarthritis of the left hip with bone on bone articulation. Moderate right hip osteoarthritis. Degenerative disc disease at L4-L5 with lumbar facet arthropathy. Cortical screw and plate  fixation of the distal right radius. IMPRESSION: 1. Acute, longitudinally oriented, mildly comminuted, isolated fracture of the left iliac wing. 2. Sigmoid diverticulosis.  No changes of acute diverticulitis. 3. Likely uterine fibroid along the anterior uterus measuring 2.1 cm. The Electronically Signed   By: Rance Burrows M.D.   On: 09/22/2023 16:17   DG Chest 1 View Result Date: 09/22/2023 CLINICAL DATA:  Left hip pain following a fall. EXAM: CHEST  1 VIEW COMPARISON:  03/26/2023 FINDINGS: Normal sized heart. Atheromatous calcifications, including the coronary arteries and aorta. Clear lungs with normal vascularity. Old, healed right rib fractures. No acute fracture or pneumothorax. IMPRESSION: No acute abnormality. Electronically Signed   By: Catherin Closs M.D.   On: 09/22/2023 11:12   DG Hip Unilat With Pelvis 2-3 Views Left Result Date: 09/22/2023 CLINICAL DATA:  Fall and left hip pain. EXAM: DG HIP (WITH OR WITHOUT PELVIS) 2-3V LEFT COMPARISON:  Radiograph dated 06/14/2019. FINDINGS: There is no acute fracture or dislocation. The bones are osteopenic. Severe arthritic changes of the left hip with loss of joint space and bone-on-bone contact. The soft tissues are unremarkable. IMPRESSION: 1. No acute fracture or dislocation. 2. 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 Electronically Signed   By: Angus Bark M.D.   On: 09/22/2023 11:11    Microbiology: No results found for this or any previous visit (from the past 240 hours).   Labs: Basic Metabolic Panel: Recent Labs  Lab 09/22/23 1015 09/23/23 0704 09/24/23 0602  NA 139 139 139  K 4.1 3.8 4.5  CL 105 101 101  CO2 27 26 29   GLUCOSE 88 96 113*  BUN 12 16 14   CREATININE 0.62 0.60 0.59  CALCIUM 9.0 9.2 9.0    CBC: Recent Labs  Lab 09/22/23 1015 09/23/23 0704 09/24/23 0602  WBC 6.9 6.7 8.0  NEUTROABS 5.4  --  6.3  HGB 11.0* 11.3* 11.9*  HCT 34.7* 35.4* 36.5  MCV 91.3 88.9 88.4  PLT 274 263 247    Signed:  Haydee Lipa DO   [facility] 09/27/2023, 7:48 AM

## 2023-09-27 NOTE — Progress Notes (Signed)
 Occupational Therapy Treatment Patient Details Name: Kelly Howard Reason MRN: [id number] DOB: [birth year] Today's Date: 09/27/2023   History of present illness 73 y/o F presenting to ED on 4/25 after mechanical fall landing on L side, CT with acute comminuted fx of L iliac wing,     PMH includes parkinson's disease, hypothyroidism, osteoporosis, falls, lewy body dementia, L clavicle fx   OT comments  Pt progressing well towards goals. Pt received on BSC, able to complete hygiene with +2 assistance. Pt continues to require +2 for dyskinetic movements and poor safety awareness. At moments pt requires less physical assistance, but poor judgement during mobility puts pt at increased fall risk. Continue to recommend <3 hours of skilled rehab daily to optimize independence levels. Will continue to follow acutely.       If plan is discharge home, recommend the following:  A lot of help with walking and/or transfers;A lot of help with bathing/dressing/bathroom;Assistance with cooking/housework;Direct supervision/assist for medications management;Direct supervision/assist for financial management;Assist for transportation;Help with stairs or ramp for entrance;Supervision due to cognitive status   Equipment Recommendations  Other (comment) (Defer to next venue)    Recommendations for Other Services      Precautions / Restrictions Precautions Precautions: Fall Recall of Precautions/Restrictions: Impaired Restrictions Weight Bearing Restrictions Per Provider Order: Yes RLE Weight Bearing Per Provider Order: Weight bearing as tolerated LLE Weight Bearing Per Provider Order: Weight bearing as tolerated       Mobility Bed Mobility Overal bed mobility: Needs Assistance Bed Mobility: Sit to Supine       Sit to supine: Min assist   General bed mobility comments: For management of BLEs    Transfers Overall transfer level: Needs assistance Equipment used: Rolling walker (2 wheels) Transfers:  Sit to/from Stand, Bed to chair/wheelchair/BSC Sit to Stand: Min assist, +2 safety/equipment Stand pivot transfers: Mod assist, +2 physical assistance, +2 safety/equipment   Step pivot transfers: +2 safety/equipment, +2 physical assistance, Mod assist     General transfer comment: Pt with dyskinetic movements during mobility, narrow steps and heavy cueing for sequencing and safety     Balance Overall balance assessment: Needs assistance Sitting-balance support: Feet supported Sitting balance-Leahy Scale: Fair     Standing balance support: Reliant on assistive device for balance, During functional activity Standing balance-Leahy Scale: Poor Standing balance comment: reliant on external support     ADL either performed or assessed with clinical judgement   ADL Overall ADL's : Needs assistance/impaired     Lower Body Dressing: Moderate assistance Lower Body Dressing Details (indicate cue type and reason): Able to perform socks with increased time and min to mod steadying assist Toilet Transfer: Moderate assistance;+2 for physical assistance;+2 for safety/equipment;BSC/3in1;Rolling walker (2 wheels) Toilet Transfer Details (indicate cue type and reason): Pt received on BSC Mod +2 for management of RW and safety Toileting- Clothing Manipulation and Hygiene: Moderate assistance;+2 for safety/equipment;Sit to/from stand Toileting - Clothing Manipulation Details (indicate cue type and reason): Pt able to wipe in standing but requiring +2 for safety with standing poor judgement with balance deficits     Functional mobility during ADLs: Rolling walker (2 wheels);Moderate assistance;+2 for safety/equipment General ADL Comments: Pt with decreased safety awareness and judgement +2 for standing tasks d/t poor insight    Extremity/Trunk Assessment Upper Extremity Assessment Upper Extremity Assessment: Generalized weakness (dyskinetic, writhing movements in UE)   Lower Extremity  Assessment Lower Extremity Assessment: Defer to PT evaluation        Vision   Vision Assessment?: No  apparent visual deficits         Communication Communication Communication: Impaired Factors Affecting Communication: Difficulty expressing self   Cognition Arousal: Alert Behavior During Therapy: Anxious, Restless Cognition: History of cognitive impairments       OT - Cognition Comments: hx dementia, difficulty following commands, poor insight into deficits       Following commands: Impaired Following commands impaired: Follows one step commands inconsistently (Increased cueing)      Cueing   Cueing Techniques: Verbal cues, Tactile cues, Visual cues, Gestural cues        General Comments HH Aid present during session    Pertinent Vitals/ Pain       Pain Assessment Pain Assessment: Faces Faces Pain Scale: Hurts a little bit Pain Location: L hip Pain Descriptors / Indicators: Grimacing Pain Intervention(s): Monitored during session, Repositioned   Frequency  Min 2X/week        Progress Toward Goals  OT Goals(current goals can now be found in the care plan section)  Progress towards OT goals: Progressing toward goals  Acute Rehab OT Goals Patient Stated Goal: None stated OT Goal Formulation: With patient Time For Goal Achievement: 10/08/23 Potential to Achieve Goals: Good ADL Goals Pt Will Perform Grooming: with supervision;standing Pt Will Perform Upper Body Dressing: with supervision;sitting Pt Will Perform Lower Body Dressing: with min assist;sitting/lateral leans;sit to/from stand Pt Will Transfer to Toilet: with contact guard assist;ambulating;regular height toilet  Plan         AM-PAC OT "6 Clicks" Daily Activity     Outcome Measure   Help from another person eating meals?: A Little Help from another person taking care of personal grooming?: A Little Help from another person toileting, which includes using toliet, bedpan, or urinal?: A  Lot Help from another person bathing (including washing, rinsing, drying)?: A Lot Help from another person to put on and taking off regular upper body clothing?: A Lot Help from another person to put on and taking off regular lower body clothing?: A Lot 6 Click Score: 14    End of Session Equipment Utilized During Treatment: Gait belt;Rolling walker (2 wheels)  OT Visit Diagnosis: Unsteadiness on feet (R26.81);Other abnormalities of gait and mobility (R26.89);Muscle weakness (generalized) (M62.81);History of falling (Z91.81);Other symptoms and signs involving the nervous system (R29.898)   Activity Tolerance Patient tolerated treatment well   Patient Left in bed;with call bell/phone within reach;with nursing/sitter in room;with bed alarm set   Nurse Communication Mobility status        Time: 1610-9604 OT Time Calculation (min): 16 min  Charges: OT General Charges $OT Visit: 1 Visit OT Treatments $Self Care/Home Management : 8-22 mins  Delmer Ferraris, OT  Acute Rehabilitation Services Office [phone] Secure chat preferred   Mickael Alamo 09/27/2023, 2:41 PM

## 2023-09-29 DIAGNOSIS — E039 Hypothyroidism, unspecified: Secondary | ICD-10-CM | POA: Diagnosis not present

## 2023-09-29 DIAGNOSIS — G20C Parkinsonism, unspecified: Secondary | ICD-10-CM | POA: Diagnosis not present

## 2023-09-29 DIAGNOSIS — E559 Vitamin D deficiency, unspecified: Secondary | ICD-10-CM | POA: Diagnosis not present

## 2023-09-29 DIAGNOSIS — F02818 Dementia in other diseases classified elsewhere, unspecified severity, with other behavioral disturbance: Secondary | ICD-10-CM | POA: Diagnosis not present

## 2023-09-29 DIAGNOSIS — N3281 Overactive bladder: Secondary | ICD-10-CM | POA: Diagnosis not present

## 2023-09-29 DIAGNOSIS — I951 Orthostatic hypotension: Secondary | ICD-10-CM | POA: Diagnosis not present

## 2023-09-29 DIAGNOSIS — S32302A Unspecified fracture of left ilium, initial encounter for closed fracture: Secondary | ICD-10-CM | POA: Diagnosis not present

## 2023-10-02 ENCOUNTER — Telehealth: Payer: Self-pay

## 2023-10-02 DIAGNOSIS — E039 Hypothyroidism, unspecified: Secondary | ICD-10-CM | POA: Diagnosis not present

## 2023-10-02 DIAGNOSIS — S32302A Unspecified fracture of left ilium, initial encounter for closed fracture: Secondary | ICD-10-CM | POA: Diagnosis not present

## 2023-10-02 DIAGNOSIS — I951 Orthostatic hypotension: Secondary | ICD-10-CM | POA: Diagnosis not present

## 2023-10-02 DIAGNOSIS — F02818 Dementia in other diseases classified elsewhere, unspecified severity, with other behavioral disturbance: Secondary | ICD-10-CM | POA: Diagnosis not present

## 2023-10-02 DIAGNOSIS — G9341 Metabolic encephalopathy: Secondary | ICD-10-CM | POA: Diagnosis not present

## 2023-10-02 DIAGNOSIS — G20C Parkinsonism, unspecified: Secondary | ICD-10-CM | POA: Diagnosis not present

## 2023-10-02 NOTE — Telephone Encounter (Signed)
 Copied from CRM [id number]. Topic: Clinical - Medical Advice >> Oct 02, 2023  9:46 AM Dewanda Foots wrote: Reason for CRM: Pt.'s spouse Drexel Gentles called and stated that Nashla had a fall on 4/25 and was at [facility] and is now in rehab [facility] in [place] for a broken pelvis, and doing well now. Will call back to get an appt for her B12 injection.   Just wanted to give an update.   [phone] if you wanted to speak to him about anything.

## 2023-10-02 NOTE — Telephone Encounter (Signed)
 FYI

## 2023-10-03 DIAGNOSIS — N3281 Overactive bladder: Secondary | ICD-10-CM | POA: Diagnosis not present

## 2023-10-03 DIAGNOSIS — G9341 Metabolic encephalopathy: Secondary | ICD-10-CM | POA: Diagnosis not present

## 2023-10-03 DIAGNOSIS — S32302A Unspecified fracture of left ilium, initial encounter for closed fracture: Secondary | ICD-10-CM | POA: Diagnosis not present

## 2023-10-03 DIAGNOSIS — E039 Hypothyroidism, unspecified: Secondary | ICD-10-CM | POA: Diagnosis not present

## 2023-10-03 DIAGNOSIS — E559 Vitamin D deficiency, unspecified: Secondary | ICD-10-CM | POA: Diagnosis not present

## 2023-10-03 DIAGNOSIS — I951 Orthostatic hypotension: Secondary | ICD-10-CM | POA: Diagnosis not present

## 2023-10-03 DIAGNOSIS — F02818 Dementia in other diseases classified elsewhere, unspecified severity, with other behavioral disturbance: Secondary | ICD-10-CM | POA: Diagnosis not present

## 2023-10-04 DIAGNOSIS — K5900 Constipation, unspecified: Secondary | ICD-10-CM | POA: Diagnosis not present

## 2023-10-04 DIAGNOSIS — G9341 Metabolic encephalopathy: Secondary | ICD-10-CM | POA: Diagnosis not present

## 2023-10-04 DIAGNOSIS — I951 Orthostatic hypotension: Secondary | ICD-10-CM | POA: Diagnosis not present

## 2023-10-04 DIAGNOSIS — E039 Hypothyroidism, unspecified: Secondary | ICD-10-CM | POA: Diagnosis not present

## 2023-10-04 DIAGNOSIS — S32302A Unspecified fracture of left ilium, initial encounter for closed fracture: Secondary | ICD-10-CM | POA: Diagnosis not present

## 2023-10-04 DIAGNOSIS — F02818 Dementia in other diseases classified elsewhere, unspecified severity, with other behavioral disturbance: Secondary | ICD-10-CM | POA: Diagnosis not present

## 2023-10-04 DIAGNOSIS — G20C Parkinsonism, unspecified: Secondary | ICD-10-CM | POA: Diagnosis not present

## 2023-10-06 DIAGNOSIS — F02818 Dementia in other diseases classified elsewhere, unspecified severity, with other behavioral disturbance: Secondary | ICD-10-CM | POA: Diagnosis not present

## 2023-10-06 DIAGNOSIS — N3281 Overactive bladder: Secondary | ICD-10-CM | POA: Diagnosis not present

## 2023-10-06 DIAGNOSIS — F411 Generalized anxiety disorder: Secondary | ICD-10-CM | POA: Diagnosis not present

## 2023-10-06 DIAGNOSIS — G20C Parkinsonism, unspecified: Secondary | ICD-10-CM | POA: Diagnosis not present

## 2023-10-06 DIAGNOSIS — K5900 Constipation, unspecified: Secondary | ICD-10-CM | POA: Diagnosis not present

## 2023-10-06 DIAGNOSIS — S32302A Unspecified fracture of left ilium, initial encounter for closed fracture: Secondary | ICD-10-CM | POA: Diagnosis not present

## 2023-10-06 DIAGNOSIS — E039 Hypothyroidism, unspecified: Secondary | ICD-10-CM | POA: Diagnosis not present

## 2023-10-06 DIAGNOSIS — I951 Orthostatic hypotension: Secondary | ICD-10-CM | POA: Diagnosis not present

## 2023-10-09 DIAGNOSIS — F02818 Dementia in other diseases classified elsewhere, unspecified severity, with other behavioral disturbance: Secondary | ICD-10-CM | POA: Diagnosis not present

## 2023-10-09 DIAGNOSIS — G20C Parkinsonism, unspecified: Secondary | ICD-10-CM | POA: Diagnosis not present

## 2023-10-09 DIAGNOSIS — E039 Hypothyroidism, unspecified: Secondary | ICD-10-CM | POA: Diagnosis not present

## 2023-10-09 DIAGNOSIS — F331 Major depressive disorder, recurrent, moderate: Secondary | ICD-10-CM | POA: Diagnosis not present

## 2023-10-09 DIAGNOSIS — Z79899 Other long term (current) drug therapy: Secondary | ICD-10-CM | POA: Diagnosis not present

## 2023-10-09 DIAGNOSIS — K5900 Constipation, unspecified: Secondary | ICD-10-CM | POA: Diagnosis not present

## 2023-10-09 DIAGNOSIS — I951 Orthostatic hypotension: Secondary | ICD-10-CM | POA: Diagnosis not present

## 2023-10-09 DIAGNOSIS — S32302A Unspecified fracture of left ilium, initial encounter for closed fracture: Secondary | ICD-10-CM | POA: Diagnosis not present

## 2023-10-11 DIAGNOSIS — E039 Hypothyroidism, unspecified: Secondary | ICD-10-CM | POA: Diagnosis not present

## 2023-10-11 DIAGNOSIS — G4700 Insomnia, unspecified: Secondary | ICD-10-CM | POA: Diagnosis not present

## 2023-10-11 DIAGNOSIS — I951 Orthostatic hypotension: Secondary | ICD-10-CM | POA: Diagnosis not present

## 2023-10-11 DIAGNOSIS — S32302A Unspecified fracture of left ilium, initial encounter for closed fracture: Secondary | ICD-10-CM | POA: Diagnosis not present

## 2023-10-11 DIAGNOSIS — F02818 Dementia in other diseases classified elsewhere, unspecified severity, with other behavioral disturbance: Secondary | ICD-10-CM | POA: Diagnosis not present

## 2023-10-11 DIAGNOSIS — G20C Parkinsonism, unspecified: Secondary | ICD-10-CM | POA: Diagnosis not present

## 2023-10-11 DIAGNOSIS — K5900 Constipation, unspecified: Secondary | ICD-10-CM | POA: Diagnosis not present

## 2023-10-13 DIAGNOSIS — N3281 Overactive bladder: Secondary | ICD-10-CM | POA: Diagnosis not present

## 2023-10-13 DIAGNOSIS — E039 Hypothyroidism, unspecified: Secondary | ICD-10-CM | POA: Diagnosis not present

## 2023-10-13 DIAGNOSIS — G20C Parkinsonism, unspecified: Secondary | ICD-10-CM | POA: Diagnosis not present

## 2023-10-13 DIAGNOSIS — E559 Vitamin D deficiency, unspecified: Secondary | ICD-10-CM | POA: Diagnosis not present

## 2023-10-13 DIAGNOSIS — K5900 Constipation, unspecified: Secondary | ICD-10-CM | POA: Diagnosis not present

## 2023-10-13 DIAGNOSIS — I951 Orthostatic hypotension: Secondary | ICD-10-CM | POA: Diagnosis not present

## 2023-10-13 DIAGNOSIS — G4700 Insomnia, unspecified: Secondary | ICD-10-CM | POA: Diagnosis not present

## 2023-10-13 DIAGNOSIS — S32302A Unspecified fracture of left ilium, initial encounter for closed fracture: Secondary | ICD-10-CM | POA: Diagnosis not present

## 2023-10-13 DIAGNOSIS — F02818 Dementia in other diseases classified elsewhere, unspecified severity, with other behavioral disturbance: Secondary | ICD-10-CM | POA: Diagnosis not present

## 2023-10-16 ENCOUNTER — Telehealth: Payer: Self-pay | Admitting: *Deleted

## 2023-10-16 ENCOUNTER — Telehealth: Payer: Self-pay

## 2023-10-16 DIAGNOSIS — M80052D Age-related osteoporosis with current pathological fracture, left femur, subsequent encounter for fracture with routine healing: Secondary | ICD-10-CM | POA: Diagnosis not present

## 2023-10-16 DIAGNOSIS — E039 Hypothyroidism, unspecified: Secondary | ICD-10-CM | POA: Diagnosis not present

## 2023-10-16 DIAGNOSIS — I951 Orthostatic hypotension: Secondary | ICD-10-CM | POA: Diagnosis not present

## 2023-10-16 DIAGNOSIS — G4700 Insomnia, unspecified: Secondary | ICD-10-CM | POA: Diagnosis not present

## 2023-10-16 DIAGNOSIS — F02818 Dementia in other diseases classified elsewhere, unspecified severity, with other behavioral disturbance: Secondary | ICD-10-CM | POA: Diagnosis not present

## 2023-10-16 DIAGNOSIS — G2401 Drug induced subacute dyskinesia: Secondary | ICD-10-CM | POA: Diagnosis not present

## 2023-10-16 DIAGNOSIS — G3183 Dementia with Lewy bodies: Secondary | ICD-10-CM | POA: Diagnosis not present

## 2023-10-16 DIAGNOSIS — G20A1 Parkinson's disease without dyskinesia, without mention of fluctuations: Secondary | ICD-10-CM | POA: Diagnosis not present

## 2023-10-16 DIAGNOSIS — M800AXD Age-related osteoporosis with current pathological fracture, other site, subsequent encounter for fracture with routine healing: Secondary | ICD-10-CM | POA: Diagnosis not present

## 2023-10-16 DIAGNOSIS — M25552 Pain in left hip: Secondary | ICD-10-CM | POA: Diagnosis not present

## 2023-10-16 DIAGNOSIS — F0284 Dementia in other diseases classified elsewhere, unspecified severity, with anxiety: Secondary | ICD-10-CM | POA: Diagnosis not present

## 2023-10-16 NOTE — Telephone Encounter (Signed)
 Verbal orders given to [facility] for PT and OT.

## 2023-10-16 NOTE — Transitions of Care (Post Inpatient/ED Visit) (Signed)
   10/16/2023  Name: Kelly Howard MRN: [id number] DOB: 26-Feb-1951  Today's TOC FU Call Status: Today's TOC FU Call Status:: Unsuccessful Call (1st Attempt) Unsuccessful Call (1st Attempt) Date: 10/16/23  Attempted to reach the patient regarding the most recent Inpatient/ED visit.  Follow Up Plan: Additional outreach attempts will be made to reach the patient to complete the Transitions of Care (Post Inpatient/ED visit) call.   Signature Darrall Ellison, LPN [facility] Nurse Health Advisor Direct Dial [phone]

## 2023-10-16 NOTE — Telephone Encounter (Signed)
 Copied from CRM [id number]. Topic: General - Other >> Oct 13, 2023  1:19 PM Martinique E wrote: Reason for CRM: Kelly Howard from [facility] called asking if patient's PCP will sign off on orders for patient to get an evaluation scheduled for PT and OT. Patient will be discharging from [facility] and questioning if it's okay for eval. Callback number for Kelly Howard is [phone].

## 2023-10-16 NOTE — Telephone Encounter (Signed)
 Copied from CRM [id number]. Topic: Appointments - Appointment Scheduling >> Oct 13, 2023  4:56 PM Kelly Howard wrote: [name] is also wondering if we need to schedule for a B12 injection as well. She was in [facility] for a fractured pelvis and then in rehab and missed her B12 injections.   [phone] Kelly Howard husband)

## 2023-10-16 NOTE — Telephone Encounter (Signed)
 Patient scheduled.

## 2023-10-17 ENCOUNTER — Telehealth: Payer: Self-pay | Admitting: *Deleted

## 2023-10-17 ENCOUNTER — Ambulatory Visit (INDEPENDENT_AMBULATORY_CARE_PROVIDER_SITE_OTHER): Admitting: *Deleted

## 2023-10-17 DIAGNOSIS — R350 Frequency of micturition: Secondary | ICD-10-CM | POA: Diagnosis not present

## 2023-10-17 DIAGNOSIS — M800AXD Age-related osteoporosis with current pathological fracture, other site, subsequent encounter for fracture with routine healing: Secondary | ICD-10-CM | POA: Diagnosis not present

## 2023-10-17 DIAGNOSIS — I951 Orthostatic hypotension: Secondary | ICD-10-CM | POA: Diagnosis not present

## 2023-10-17 DIAGNOSIS — G2401 Drug induced subacute dyskinesia: Secondary | ICD-10-CM | POA: Diagnosis not present

## 2023-10-17 DIAGNOSIS — E039 Hypothyroidism, unspecified: Secondary | ICD-10-CM | POA: Diagnosis not present

## 2023-10-17 DIAGNOSIS — F0284 Dementia in other diseases classified elsewhere, unspecified severity, with anxiety: Secondary | ICD-10-CM | POA: Diagnosis not present

## 2023-10-17 DIAGNOSIS — G4700 Insomnia, unspecified: Secondary | ICD-10-CM | POA: Diagnosis not present

## 2023-10-17 DIAGNOSIS — G20A1 Parkinson's disease without dyskinesia, without mention of fluctuations: Secondary | ICD-10-CM | POA: Diagnosis not present

## 2023-10-17 DIAGNOSIS — E538 Deficiency of other specified B group vitamins: Secondary | ICD-10-CM | POA: Diagnosis not present

## 2023-10-17 DIAGNOSIS — F02818 Dementia in other diseases classified elsewhere, unspecified severity, with other behavioral disturbance: Secondary | ICD-10-CM | POA: Diagnosis not present

## 2023-10-17 DIAGNOSIS — G3183 Dementia with Lewy bodies: Secondary | ICD-10-CM | POA: Diagnosis not present

## 2023-10-17 MED ORDER — CYANOCOBALAMIN 1000 MCG/ML IJ SOLN
1000.0000 ug | Freq: Once | INTRAMUSCULAR | Status: AC
Start: 2023-10-17 — End: 2023-10-17
  Administered 2023-10-17: 1000 ug via INTRAMUSCULAR

## 2023-10-17 NOTE — Telephone Encounter (Signed)
 Copied from CRM [id number]. Topic: Clinical - Home Health Verbal Orders >> Oct 17, 2023 11:28 AM Juluis Ok wrote: Caller/Agency: [facility] Callback Number: [phone] Service Requested: Occupational Therapy Frequency: 1 x 5 weeks Any new concerns about the patient? No

## 2023-10-17 NOTE — Telephone Encounter (Signed)
 Left detailed message on machine for [facility] for OT.

## 2023-10-17 NOTE — Progress Notes (Signed)
 Per orders of Dr. Ardyth Harps, injection of B12 given by Kern Reap. Patient tolerated injection well.

## 2023-10-17 NOTE — Transitions of Care (Post Inpatient/ED Visit) (Signed)
   10/17/2023  Name: Kelly Howard MRN: [id number] DOB: [birth year]  Today's TOC FU Call Status: Today's TOC FU Call Status:: Unsuccessful Call (2nd Attempt) Unsuccessful Call (1st Attempt) Date: 10/16/23 Unsuccessful Call (2nd Attempt) Date: 10/17/23  Attempted to reach the patient regarding the most recent Inpatient/ED visit.  Follow Up Plan: Additional outreach attempts will be made to reach the patient to complete the Transitions of Care (Post Inpatient/ED visit) call.   Signature Darrall Ellison, LPN [facility] Nurse Health Advisor Direct Dial [phone]

## 2023-10-18 NOTE — Transitions of Care (Post Inpatient/ED Visit) (Signed)
   10/18/2023  Name: Kelly Howard MRN: [id number] DOB: [birth year]  Today's TOC FU Call Status: Today's TOC FU Call Status:: Unsuccessful Call (3rd Attempt) Unsuccessful Call (1st Attempt) Date: 10/16/23 Unsuccessful Call (2nd Attempt) Date: 10/17/23 Unsuccessful Call (3rd Attempt) Date: 10/18/23  Attempted to reach the patient regarding the most recent Inpatient/ED visit.  Follow Up Plan: No further outreach attempts will be made at this time. We have been unable to contact the patient.  Signature Darrall Ellison, LPN [facility] Nurse Health Advisor Direct Dial [phone]

## 2023-10-24 DIAGNOSIS — Z85828 Personal history of other malignant neoplasm of skin: Secondary | ICD-10-CM | POA: Diagnosis not present

## 2023-10-24 DIAGNOSIS — L821 Other seborrheic keratosis: Secondary | ICD-10-CM | POA: Diagnosis not present

## 2023-10-24 DIAGNOSIS — D225 Melanocytic nevi of trunk: Secondary | ICD-10-CM | POA: Diagnosis not present

## 2023-10-24 DIAGNOSIS — L814 Other melanin hyperpigmentation: Secondary | ICD-10-CM | POA: Diagnosis not present

## 2023-10-24 DIAGNOSIS — D2272 Melanocytic nevi of left lower limb, including hip: Secondary | ICD-10-CM | POA: Diagnosis not present

## 2023-10-25 ENCOUNTER — Telehealth: Payer: Self-pay | Admitting: Adult Health

## 2023-10-25 DIAGNOSIS — G4700 Insomnia, unspecified: Secondary | ICD-10-CM | POA: Diagnosis not present

## 2023-10-25 DIAGNOSIS — G3183 Dementia with Lewy bodies: Secondary | ICD-10-CM | POA: Diagnosis not present

## 2023-10-25 DIAGNOSIS — F02818 Dementia in other diseases classified elsewhere, unspecified severity, with other behavioral disturbance: Secondary | ICD-10-CM | POA: Diagnosis not present

## 2023-10-25 DIAGNOSIS — M800AXD Age-related osteoporosis with current pathological fracture, other site, subsequent encounter for fracture with routine healing: Secondary | ICD-10-CM | POA: Diagnosis not present

## 2023-10-25 DIAGNOSIS — F0284 Dementia in other diseases classified elsewhere, unspecified severity, with anxiety: Secondary | ICD-10-CM | POA: Diagnosis not present

## 2023-10-25 DIAGNOSIS — I951 Orthostatic hypotension: Secondary | ICD-10-CM | POA: Diagnosis not present

## 2023-10-25 DIAGNOSIS — G20A1 Parkinson's disease without dyskinesia, without mention of fluctuations: Secondary | ICD-10-CM | POA: Diagnosis not present

## 2023-10-25 DIAGNOSIS — E039 Hypothyroidism, unspecified: Secondary | ICD-10-CM | POA: Diagnosis not present

## 2023-10-25 DIAGNOSIS — G2401 Drug induced subacute dyskinesia: Secondary | ICD-10-CM | POA: Diagnosis not present

## 2023-10-25 MED ORDER — QUETIAPINE FUMARATE 25 MG PO TABS: 25.0000 mg | ORAL_TABLET | Freq: Every day | ORAL | 0 refills | Status: DC

## 2023-10-25 MED ORDER — QUETIAPINE FUMARATE 25 MG PO TABS
25.0000 mg | ORAL_TABLET | Freq: Every day | ORAL | 0 refills | Status: DC
Start: 2023-10-25 — End: 2023-10-25

## 2023-10-25 NOTE — Telephone Encounter (Signed)
 Talked with husband about [name]'s recommendations.  Discussed with Bonnell Butcher.

## 2023-10-25 NOTE — Telephone Encounter (Signed)
 Pt was in rehab for a broken pelvis and was prescribed quetiapine  25 mg. She is now home and husband is asking if you will prescribe this or something else for sleep.

## 2023-10-25 NOTE — Telephone Encounter (Signed)
 Kelly Howard said ok to send in Rx for Seroquel  25 mg. Sent to requested pharmacy.

## 2023-10-25 NOTE — Addendum Note (Signed)
 Addended by: Geno Kida T on: 10/25/2023 01:54 PM   Modules accepted: Orders

## 2023-10-25 NOTE — Telephone Encounter (Signed)
 Husband called and said that debbie fell and broke her pelvis. She was in hospital and rehab. She had a hard time sleeping. So the hospital prescribed seroquel  25 mg to help her sleep. She is out of this medication. She would like gina to send a [facility] [place]. Please call  Drexel Gentles at [phone] and let him know if the seroquel  will be sent in or if gina wants a different medicine

## 2023-10-31 DIAGNOSIS — I951 Orthostatic hypotension: Secondary | ICD-10-CM | POA: Diagnosis not present

## 2023-10-31 DIAGNOSIS — G20A1 Parkinson's disease without dyskinesia, without mention of fluctuations: Secondary | ICD-10-CM | POA: Diagnosis not present

## 2023-10-31 DIAGNOSIS — F0284 Dementia in other diseases classified elsewhere, unspecified severity, with anxiety: Secondary | ICD-10-CM | POA: Diagnosis not present

## 2023-10-31 DIAGNOSIS — G2401 Drug induced subacute dyskinesia: Secondary | ICD-10-CM | POA: Diagnosis not present

## 2023-10-31 DIAGNOSIS — F02818 Dementia in other diseases classified elsewhere, unspecified severity, with other behavioral disturbance: Secondary | ICD-10-CM | POA: Diagnosis not present

## 2023-10-31 DIAGNOSIS — G4700 Insomnia, unspecified: Secondary | ICD-10-CM | POA: Diagnosis not present

## 2023-10-31 DIAGNOSIS — E039 Hypothyroidism, unspecified: Secondary | ICD-10-CM | POA: Diagnosis not present

## 2023-10-31 DIAGNOSIS — G3183 Dementia with Lewy bodies: Secondary | ICD-10-CM | POA: Diagnosis not present

## 2023-10-31 DIAGNOSIS — M800AXD Age-related osteoporosis with current pathological fracture, other site, subsequent encounter for fracture with routine healing: Secondary | ICD-10-CM | POA: Diagnosis not present

## 2023-11-01 ENCOUNTER — Ambulatory Visit (INDEPENDENT_AMBULATORY_CARE_PROVIDER_SITE_OTHER)

## 2023-11-01 VITALS — BP 118/60 | HR 66 | Temp 98.6°F | Ht 65.5 in | Wt 102.0 lb

## 2023-11-01 DIAGNOSIS — Z0000 Encounter for general adult medical examination without abnormal findings: Secondary | ICD-10-CM | POA: Diagnosis not present

## 2023-11-01 NOTE — Patient Instructions (Addendum)
 Kelly Howard , Thank you for taking time out of your busy schedule to complete your Annual Wellness Visit with me. I enjoyed our conversation and look forward to speaking with you again next year. I, as well as your care team,  appreciate your ongoing commitment to your health goals. Please review the following plan we discussed and let me know if I can assist you in the future. Your Game plan/ To Do List    Referrals: If you haven't heard from the office you've been referred to, please reach out to them at the phone provided.    Follow up Visits: Next Medicare AWV with our clinical staff: 11/06/24 @ 11:20a   Have you seen your provider in the last 6 months (3 months if uncontrolled diabetes)? 09/18/23 Next Office Visit with your provider:   Clinician Recommendations:  Aim for 30 minutes of exercise or brisk walking, 6-8 glasses of water, and 5 servings of fruits and vegetables each day.       This is a list of the screening recommended for you and due dates:  Health Maintenance  Topic Date Due   Hepatitis C Screening  Never done   Zoster (Shingles) Vaccine (1 of 2) 12/29/1968   COVID-19 Vaccine (4 - 2024-25 season) 01/29/2023   Flu Shot  12/29/2023   Mammogram  01/06/2024   Medicare Annual Wellness Visit  10/31/2024   DTaP/Tdap/Td vaccine (4 - Td or Tdap) 11/08/2027   Colon Cancer Screening  04/29/2028   Pneumonia Vaccine  Completed   DEXA scan (bone density measurement)  Completed   HPV Vaccine  Aged Out   Meningitis B Vaccine  Aged Out    Advanced directives: (Copy Requested) Please bring a copy of your health care power of attorney and living will to the office to be added to your chart at your convenience. You can mail to [facility] [address] [place], [place] [address] or email to [email or web address] Advance Care Planning is important because it:  [x]  Makes sure you receive the medical care that is consistent with your values, goals, and  preferences  [x]  It provides guidance to your family and loved ones and reduces their decisional burden about whether or not they are making the right decisions based on your wishes.  Follow the link provided in your after visit summary or read over the paperwork we have mailed to you to help you started getting your Advance Directives in place. If you need assistance in completing these, please reach out to us  so that we can help you!  See attachments for Preventive Care and Fall Prevention Tips.

## 2023-11-01 NOTE — Progress Notes (Signed)
 Subjective:   Kelly Howard is a 73 y.o. who presents for a Medicare Wellness preventive visit.  As a reminder, Annual Wellness Visits don't include a physical exam, and some assessments may be limited, especially if this visit is performed virtually. We may recommend an in-person follow-up visit with your provider if needed.  Visit Complete: In person    Persons Participating in Visit: Patient, Husband and Aide  AWV Questionnaire: No: Patient Medicare AWV questionnaire was not completed prior to this visit.  Cardiac Risk Factors include: advanced age (>75men, >3 women)     Objective:     Today's Vitals   11/01/23 1108  BP: 118/60  Pulse: 66  Temp: 98.6 F (37 C)  TempSrc: Oral  SpO2: 98%  Weight: 102 lb (46.3 kg)  Height: 5' 5.5" (1.664 m)   Body mass index is 16.72 kg/m.     11/01/2023   11:38 AM 09/22/2023   11:00 PM 09/22/2023    9:36 AM 03/26/2023    4:16 PM 03/21/2023    2:03 AM 09/14/2022    8:56 AM 08/20/2021   12:18 PM  Advanced Directives  Does Patient Have a Medical Advance Directive? Yes Yes No No No Yes Yes  Type of Estate agent of [employer];Living will Healthcare Power of [facility]    Living will;Healthcare Power of [place];Out of facility DNR (pink MOST or yellow form) Healthcare Power of [place];Living will  Does patient want to make changes to medical advance directive?  No - Guardian declined    No - Patient declined   Copy of Healthcare Power of Attorney in Chart? No - copy requested No - copy requested    No - copy requested   Would patient like information on creating a medical advance directive?    No - Patient declined       Current Medications (verified) Outpatient Encounter Medications as of 11/01/2023  Medication Sig   acetaminophen  (TYLENOL ) 500 MG tablet Take 1.5 tablets (750 mg total) by mouth every 6 (six) hours.   AMBULATORY NON FORMULARY MEDICATION Abdominal compression binder Dx: G20    Carbidopa -Levodopa  ER (RYTARY ) 48.75-195 MG CPCR Take 10 tablets by mouth See admin instructions. Take 2 tablets by mouth at  6am, then take  2 tablets by mouth at 10am, then take 2 tablets by mouth  at 2pm, then take 2 tablets by mouth  at 6 pm, and  take 2 tablets by mouth  at 10 pm per spouse   cyanocobalamin  (VITAMIN B12) 1000 MCG/ML injection Inject into the muscle every 30 (thirty) days.   denosumab (PROLIA) 60 MG/ML SOSY injection Inject 60 mg into the skin every 6 (six) months.   INBRIJA  42 MG CAPS Take 1 capsule by mouth daily as needed (mobility per spouse).   levothyroxine  (SYNTHROID , LEVOTHROID) 50 MCG tablet Take 50 mcg by mouth daily before breakfast.   NUPLAZID  34 MG CAPS Take 1 capsule by mouth daily.   OLANZapine  zydis (ZYPREXA ) 5 MG disintegrating tablet Take 1 tablet (5 mg total) by mouth daily.   QUEtiapine  (SEROQUEL ) 25 MG tablet Take 1 tablet (25 mg total) by mouth at bedtime.   rivastigmine  (EXELON ) 9.5 mg/24hr Place 9.5 mg onto the skin daily.   sertraline  (ZOLOFT ) 100 MG tablet TAKE ONE TABLET DAILY.   traZODone  (DESYREL ) 50 MG tablet Take one to three tablets at bedtime.   Vibegron 75 MG TABS Take 75 mg by mouth at bedtime.   VITAMIN D  PO Take 1 capsule  by mouth daily.   XEOMIN 50 units SOLR injection Inject 50 Units into the muscle every 3 (three) months.   No facility-administered encounter medications on file as of 11/01/2023.    Allergies (verified) Codeine, Escitalopram, Pramipexole, and Propoxyphene   History: Past Medical History:  Diagnosis Date   Frequent falls    Hypothyroidism    Osteoporosis    Parkinson's disease (HCC)    Past Surgical History:  Procedure Laterality Date   TONSILLECTOMY     WRIST SURGERY     Family History  Problem Relation Age of Onset   Hypercalcemia Mother    Cancer Paternal Grandmother    Diabetes Paternal Grandfather    Social History   Socioeconomic History   Marital status: Married    Spouse name: Malayjah Otoole    Number of children: 2   Years of education: 12   Highest education level: 12th grade  Occupational History   Not on file  Tobacco Use   Smoking status: Never    Passive exposure: Past   Smokeless tobacco: Never  Vaping Use   Vaping status: Never Used  Substance and Sexual Activity   Alcohol use: Yes   Drug use: Never   Sexual activity: Not Currently  Other Topics Concern   Not on file  Social History Narrative   Not on file   Social Drivers of Health   Financial Resource Strain: Low Risk  (11/01/2023)   Overall Financial Resource Strain (CARDIA)    Difficulty of Paying Living Expenses: Not hard at all  Food Insecurity: No Food Insecurity (11/01/2023)   Hunger Vital Sign    Worried About Running Out of Food in the Last Year: Never true    Ran Out of Food in the Last Year: Never true  Transportation Needs: No Transportation Needs (11/01/2023)   PRAPARE - Administrator, Civil Service (Medical): No    Lack of Transportation (Non-Medical): No  Physical Activity: Insufficiently Active (11/01/2023)   Exercise Vital Sign    Days of Exercise per Week: 5 days    Minutes of Exercise per Session: 10 min  Stress: No Stress Concern Present (11/01/2023)   [employer] of Occupational Health - Occupational Stress Questionnaire    Feeling of Stress : Not at all  Social Connections: Socially Integrated (11/01/2023)   Social Connection and Isolation Panel [NHANES]    Frequency of Communication with Friends and Family: More than three times a week    Frequency of Social Gatherings with Friends and Family: More than three times a week    Attends Religious Services: More than 4 times per year    Active Member of [employer] or Organizations: Yes    Attends Engineer, structural: More than 4 times per year    Marital Status: Married  Recent Concern: Social Connections - Moderately Isolated (09/22/2023)   Social Connection and Isolation Panel [NHANES]    Frequency of Communication  with Friends and Family: More than three times a week    Frequency of Social Gatherings with Friends and Family: More than three times a week    Attends Religious Services: Never    Database administrator or Organizations: No    Attends Engineer, structural: Never    Marital Status: Married    Tobacco Counseling Counseling given: Not Answered    Clinical Intake:  Pre-visit preparation completed: Yes  Pain : No/denies pain     BMI - recorded: 16.72 Nutritional Status: BMI <19  Underweight Nutritional Risks: None Diabetes: No  Lab Results  Component Value Date   HGBA1C 5.8 08/05/2020   HGBA1C 5.7 07/10/2019     How often do you need to have someone help you when you read instructions, pamphlets, or other written materials from your doctor or pharmacy?: 5 - Always (Husband and aide assist)  Interpreter Needed?: No  Information entered by :: Farris Hong LPN   Activities of Daily Living     11/01/2023   11:31 AM 09/22/2023   11:00 PM  In your present state of health, do you have any difficulty performing the following activities:  Hearing? 0 0  Vision? 0 0  Difficulty concentrating or making decisions? 1 1  Comment Husband assist   Walking or climbing stairs? 1   Comment Uses a Walker   Dressing or bathing? 1   Comment Aide assist   Doing errands, shopping? 1 1  Comment Husband and aide assist   Preparing Food and eating ? Y   Comment Aide and Husband assist   Using the Toilet? N   In the past six months, have you accidently leaked urine? Y   Comment Wears breifs. Followed by Urologist   Do you have problems with loss of bowel control? N   Managing your Medications? Y   Comment Aide assist   Managing your Finances? Y   Comment Husband assist   Housekeeping or managing your Housekeeping? Y   Comment Aide assist     Patient Care Team: Zilphia Hilt, Charyl Coppersmith, MD as PCP - General (Internal Medicine) Tat, Von Grumbling, DO as Consulting Physician  (Neurology)  I have updated your Care Teams any recent Medical Services you may have received from other providers in the past year.     Assessment:    This is a routine wellness examination for Kelly Howard.  Hearing/Vision screen Hearing Screening - Comments:: Denies hearing difficulties   Vision Screening - Comments:: Wears rx glasses - up to date with routine eye exams with  [facility]   Goals Addressed               This Visit's Progress     Increase physical activity (pt-stated)        Remain active.       Depression Screen     11/01/2023   11:08 AM 03/22/2023    9:46 AM 09/14/2022    9:41 AM 08/10/2021    1:46 PM 04/19/2021    2:30 PM 02/12/2021    7:22 PM 08/27/2020   10:16 AM  PHQ 2/9 Scores  PHQ - 2 Score 0 1 1 3 4 1 4   PHQ- 9 Score  6 7 9 19  7     Fall Risk     11/01/2023   11:36 AM 03/22/2023    9:46 AM 09/14/2022    8:56 AM 10/29/2021   11:14 AM 08/10/2021    1:47 PM  Fall Risk   Falls in the past year? 1 1 1  1   Number falls in past yr: 0 1 1 1 1   Injury with Fall? 1 1 0 1 1  Comment Left Pelvis FX. Followed by medical attention      Risk for fall due to : No Fall Risks;Impaired balance/gait;History of fall(s)   History of fall(s);Impaired balance/gait;Impaired mobility Impaired balance/gait  Follow up Falls evaluation completed Falls evaluation completed Falls evaluation completed Education provided;Falls prevention discussed Falls evaluation completed    MEDICARE RISK AT HOME:  Medicare  Risk at Home Any stairs in or around the home?: No If so, are there any without handrails?: No Home free of loose throw rugs in walkways, pet beds, electrical cords, etc?: Yes Adequate lighting in your home to reduce risk of falls?: Yes Life alert?: No Use of a cane, walker or w/c?: Yes Grab bars in the bathroom?: Yes Shower chair or bench in shower?: Yes Elevated toilet seat or a handicapped toilet?: Yes  TIMED UP AND GO:  Was the test performed?  Yes   Length of time to ambulate 10 feet: 10 sec Gait unsteady without use of assistive device, provider informed and interventions were implemented  Cognitive Function: 6CIT completed        11/01/2023   11:38 AM 09/14/2022    8:57 AM  6CIT Screen  What Year? 0 points 0 points  What month? 0 points 0 points  What time? 0 points 3 points  Count back from 20 0 points 0 points  Months in reverse 2 points 0 points  Repeat phrase 2 points 0 points  Total Score 4 points 3 points    Immunizations Immunization History  Administered Date(s) Administered   Fluad Quad(high Dose 65+) 02/14/2019   Fluad Trivalent(High Dose 65+) 03/22/2023   Influenza, High Dose Seasonal PF 03/23/2020   Influenza,inj,Quad PF,6+ Mos 03/15/2018   Influenza-Unspecified 03/24/2021, 04/05/2021   PFIZER(Purple Top)SARS-COV-2 Vaccination 07/25/2019, 08/20/2019, 05/08/2020   Pneumococcal Conjugate-13 07/09/2018   Pneumococcal Polysaccharide-23 07/10/2019   Td 10/27/2003, 11/12/2013   Tdap 11/07/2017   Zoster, Live 11/06/2012, 01/06/2013    Screening Tests Health Maintenance  Topic Date Due   Hepatitis C Screening  Never done   Zoster Vaccines- Shingrix (1 of 2) 12/29/1968   COVID-19 Vaccine (4 - 2024-25 season) 01/29/2023   INFLUENZA VACCINE  12/29/2023   MAMMOGRAM  01/06/2024   Medicare Annual Wellness (AWV)  10/31/2024   DTaP/Tdap/Td (4 - Td or Tdap) 11/08/2027   Colonoscopy  04/29/2028   Pneumonia Vaccine 68+ Years old  Completed   DEXA SCAN  Completed   HPV VACCINES  Aged Out   Meningococcal B Vaccine  Aged Out    Health Maintenance  Health Maintenance Due  Topic Date Due   Hepatitis C Screening  Never done   Zoster Vaccines- Shingrix (1 of 2) 12/29/1968   COVID-19 Vaccine (4 - 2024-25 season) 01/29/2023   Health Maintenance Items Addressed:   Additional Screening:  Vision Screening: Recommended annual ophthalmology exams for early detection of glaucoma and other disorders of the eye. Would  you like a referral to an eye doctor? No    Dental Screening: Recommended annual dental exams for proper oral hygiene  Community Resource Referral / Chronic Care Management: CRR required this visit?  No   CCM required this visit?  No   Plan:    I have personally reviewed and noted the following in the patient's chart:   Medical and social history Use of alcohol, tobacco or illicit drugs  Current medications and supplements including opioid prescriptions. Patient is not currently taking opioid prescriptions. Functional ability and status Nutritional status Physical activity Advanced directives List of other physicians Hospitalizations, surgeries, and ER visits in previous 12 months Vitals Screenings to include cognitive, depression, and falls Referrals and appointments  In addition, I have reviewed and discussed with patient certain preventive protocols, quality metrics, and best practice recommendations. A written personalized care plan for preventive services as well as general preventive health recommendations were provided to patient.   Alvy Baar  Lex Redbird, LPN   06/04/1094   After Visit Summary: (In Person-Printed) AVS printed and given to the patient  Notes: Nothing significant to report at this time.

## 2023-11-07 DIAGNOSIS — I951 Orthostatic hypotension: Secondary | ICD-10-CM | POA: Diagnosis not present

## 2023-11-07 DIAGNOSIS — G2401 Drug induced subacute dyskinesia: Secondary | ICD-10-CM | POA: Diagnosis not present

## 2023-11-07 DIAGNOSIS — M800AXD Age-related osteoporosis with current pathological fracture, other site, subsequent encounter for fracture with routine healing: Secondary | ICD-10-CM | POA: Diagnosis not present

## 2023-11-07 DIAGNOSIS — G3183 Dementia with Lewy bodies: Secondary | ICD-10-CM | POA: Diagnosis not present

## 2023-11-07 DIAGNOSIS — E039 Hypothyroidism, unspecified: Secondary | ICD-10-CM | POA: Diagnosis not present

## 2023-11-07 DIAGNOSIS — F0284 Dementia in other diseases classified elsewhere, unspecified severity, with anxiety: Secondary | ICD-10-CM | POA: Diagnosis not present

## 2023-11-07 DIAGNOSIS — G4700 Insomnia, unspecified: Secondary | ICD-10-CM | POA: Diagnosis not present

## 2023-11-07 DIAGNOSIS — F02818 Dementia in other diseases classified elsewhere, unspecified severity, with other behavioral disturbance: Secondary | ICD-10-CM | POA: Diagnosis not present

## 2023-11-07 DIAGNOSIS — G20A1 Parkinson's disease without dyskinesia, without mention of fluctuations: Secondary | ICD-10-CM | POA: Diagnosis not present

## 2023-11-08 DIAGNOSIS — M800AXD Age-related osteoporosis with current pathological fracture, other site, subsequent encounter for fracture with routine healing: Secondary | ICD-10-CM | POA: Diagnosis not present

## 2023-11-08 DIAGNOSIS — F0284 Dementia in other diseases classified elsewhere, unspecified severity, with anxiety: Secondary | ICD-10-CM | POA: Diagnosis not present

## 2023-11-08 DIAGNOSIS — G20A1 Parkinson's disease without dyskinesia, without mention of fluctuations: Secondary | ICD-10-CM | POA: Diagnosis not present

## 2023-11-08 DIAGNOSIS — E039 Hypothyroidism, unspecified: Secondary | ICD-10-CM | POA: Diagnosis not present

## 2023-11-08 DIAGNOSIS — G3183 Dementia with Lewy bodies: Secondary | ICD-10-CM | POA: Diagnosis not present

## 2023-11-08 DIAGNOSIS — G4700 Insomnia, unspecified: Secondary | ICD-10-CM | POA: Diagnosis not present

## 2023-11-08 DIAGNOSIS — F02818 Dementia in other diseases classified elsewhere, unspecified severity, with other behavioral disturbance: Secondary | ICD-10-CM | POA: Diagnosis not present

## 2023-11-08 DIAGNOSIS — G2401 Drug induced subacute dyskinesia: Secondary | ICD-10-CM | POA: Diagnosis not present

## 2023-11-08 DIAGNOSIS — I951 Orthostatic hypotension: Secondary | ICD-10-CM | POA: Diagnosis not present

## 2023-11-13 DIAGNOSIS — F02818 Dementia in other diseases classified elsewhere, unspecified severity, with other behavioral disturbance: Secondary | ICD-10-CM | POA: Diagnosis not present

## 2023-11-13 DIAGNOSIS — I951 Orthostatic hypotension: Secondary | ICD-10-CM | POA: Diagnosis not present

## 2023-11-13 DIAGNOSIS — G2401 Drug induced subacute dyskinesia: Secondary | ICD-10-CM | POA: Diagnosis not present

## 2023-11-13 DIAGNOSIS — G3183 Dementia with Lewy bodies: Secondary | ICD-10-CM | POA: Diagnosis not present

## 2023-11-13 DIAGNOSIS — G4700 Insomnia, unspecified: Secondary | ICD-10-CM | POA: Diagnosis not present

## 2023-11-13 DIAGNOSIS — G20A1 Parkinson's disease without dyskinesia, without mention of fluctuations: Secondary | ICD-10-CM | POA: Diagnosis not present

## 2023-11-13 DIAGNOSIS — F0284 Dementia in other diseases classified elsewhere, unspecified severity, with anxiety: Secondary | ICD-10-CM | POA: Diagnosis not present

## 2023-11-13 DIAGNOSIS — E039 Hypothyroidism, unspecified: Secondary | ICD-10-CM | POA: Diagnosis not present

## 2023-11-13 DIAGNOSIS — M800AXD Age-related osteoporosis with current pathological fracture, other site, subsequent encounter for fracture with routine healing: Secondary | ICD-10-CM | POA: Diagnosis not present

## 2023-11-14 ENCOUNTER — Ambulatory Visit (INDEPENDENT_AMBULATORY_CARE_PROVIDER_SITE_OTHER): Payer: Medicare PPO | Admitting: Adult Health

## 2023-11-14 ENCOUNTER — Encounter: Payer: Self-pay | Admitting: Adult Health

## 2023-11-14 DIAGNOSIS — G4700 Insomnia, unspecified: Secondary | ICD-10-CM

## 2023-11-14 DIAGNOSIS — F410 Panic disorder [episodic paroxysmal anxiety] without agoraphobia: Secondary | ICD-10-CM

## 2023-11-14 DIAGNOSIS — F331 Major depressive disorder, recurrent, moderate: Secondary | ICD-10-CM | POA: Diagnosis not present

## 2023-11-14 DIAGNOSIS — R350 Frequency of micturition: Secondary | ICD-10-CM | POA: Diagnosis not present

## 2023-11-14 DIAGNOSIS — F411 Generalized anxiety disorder: Secondary | ICD-10-CM | POA: Diagnosis not present

## 2023-11-14 MED ORDER — QUETIAPINE FUMARATE 25 MG PO TABS: 25.0000 mg | ORAL_TABLET | Freq: Every day | ORAL | 1 refills | Status: DC

## 2023-11-14 NOTE — Progress Notes (Signed)
 Kelly Howard [id number] 1951-03-16 73 y.o.  Subjective:   Patient ID:  Kelly Howard is a 73 y.o. (DOB 12-13-1950) female.  Chief Complaint: No chief complaint on file.   HPI Kelly Howard presents to the office today for follow-up of GAD, MDD, panic attacks, and insomnia.  Diagnosed with Parkinson's in 2010 - followed by [facility]rology.   Accompanied by husband.  Describes mood today as ok. Tearful at times. Pleasant. Mood symptoms - reports situational anxiety. Denies depression and irritability. Reports varying interest and motivation. Denies panic attacks. Reports some worry, over thinking and rumination. Reports mood fluctuates. Stating I feel like I'm having good and bad days.  Husband supportive. Has a caregiver 25 hours a week. Feels like medications are helpful. Taking medications as prescribed. Energy levels vary. Active, has a regular exercise routine. Enjoys some usual interests and activities. Married. Lives with husband. Has 2 daughters - 3 grandchildren. Spending time with family. Appetite adequate. Weight loss 102 from 110 pounds. Sleeps better some nights than others. Averages 8 hours. Denies daytime napping. Reports focus and concentration difficulties. Completing tasks. Managing some aspects of household. Retired. Denies SI or HI.  Decreased AH. Denies AH. Reports VH.   Previous medication trials: Clonazepam , Zoloft , Paxil, Celexa   GAD-7    Flowsheet Row Office Visit from 09/14/2022 in [facility] [place]  Total GAD-7 Score 6   PHQ2-9    Flowsheet Row Clinical Support from 11/01/2023 in [facility] [place] Office Visit from 03/22/2023 in [facility] [place] Office Visit from 09/14/2022 in [facility] [place] Office Visit from 08/10/2021 in [facility] [place] Office Visit from 04/19/2021 in [facility] [place]  PHQ-2 Total Score 0 1 1 3 4   PHQ-9 Total Score -- 6 7 9 19    Flowsheet Row ED to Hosp-Admission (Discharged) from 09/22/2023 in [facility] ED to Hosp-Admission (Discharged) from 03/26/2023 in [place] [facility] ED from 03/21/2023 in [facility] Department at [facility]  C-SSRS RISK CATEGORY No Risk No Risk No Risk     Review of Systems:  Review of Systems  Musculoskeletal:  Negative for gait problem.  Neurological:  Negative for tremors.  Psychiatric/Behavioral:         Please refer to HPI    Medications: I have reviewed the patient's current medications.  Current Outpatient Medications  Medication Sig Dispense Refill   acetaminophen  (TYLENOL ) 500 MG tablet Take 1.5 tablets (750 mg total) by mouth every 6 (six) hours.     AMBULATORY NON FORMULARY MEDICATION Abdominal compression binder Dx: G20 1 Device 0   Carbidopa -Levodopa  ER (RYTARY ) 48.75-195 MG CPCR Take 10 tablets by mouth See admin instructions. Take 2 tablets by mouth at  6am, then take  2 tablets by mouth at 10am, then take 2 tablets by mouth  at 2pm, then take 2 tablets by mouth  at 6 pm, and  take 2 tablets by mouth  at 10 pm per spouse     cyanocobalamin  (VITAMIN B12) 1000 MCG/ML injection Inject into the muscle every 30 (thirty) days.     denosumab (PROLIA) 60 MG/ML SOSY injection Inject 60 mg into the skin every 6 (six) months.     INBRIJA  42 MG CAPS Take 1 capsule by mouth daily as needed (mobility per spouse).     levothyroxine  (SYNTHROID ,  LEVOTHROID) 50 MCG tablet Take 50 mcg by mouth daily before breakfast.     NUPLAZID  34 MG CAPS Take 1 capsule by mouth daily.     OLANZapine  zydis (ZYPREXA ) 5 MG disintegrating tablet Take 1 tablet (5 mg total) by mouth daily. 20 tablet 0   QUEtiapine  (SEROQUEL ) 25 MG tablet Take 1 tablet (25 mg total) by mouth at bedtime. 30 tablet 0   rivastigmine  (EXELON ) 9.5  mg/24hr Place 9.5 mg onto the skin daily.     sertraline  (ZOLOFT ) 100 MG tablet TAKE ONE TABLET DAILY. 90 tablet 3   traZODone  (DESYREL ) 50 MG tablet Take one to three tablets at bedtime. 270 tablet 3   Vibegron 75 MG TABS Take 75 mg by mouth at bedtime.     VITAMIN D  PO Take 1 capsule by mouth daily.     XEOMIN 50 units SOLR injection Inject 50 Units into the muscle every 3 (three) months.     No current facility-administered medications for this visit.    Medication Side Effects: None  Allergies:  Allergies  Allergen Reactions   Codeine Other (See Comments)    GI Upset   Escitalopram Swelling and Other (See Comments)    Feet swell   Pramipexole     Other Reaction(s): Psychosis   Propoxyphene Other (See Comments)    Hallucinations    Past Medical History:  Diagnosis Date   Frequent falls    Hypothyroidism    Osteoporosis    Parkinson's disease (HCC)     Past Medical History, Surgical history, Social history, and Family history were reviewed and updated as appropriate.   Please see review of systems for further details on the patient's review from today.   Objective:   Physical Exam:  LMP  (LMP Unknown) Comment: gyn  Physical Exam Constitutional:      General: She is not in acute distress.  Musculoskeletal:        General: No deformity.   Neurological:     Mental Status: She is alert and oriented to person, place, and time.     Coordination: Coordination normal.   Psychiatric:        Attention and Perception: Attention and perception normal. She does not perceive auditory or visual hallucinations.        Mood and Affect: Mood normal. Mood is not anxious or depressed. Affect is not labile, blunt, angry or inappropriate.        Speech: Speech normal.        Behavior: Behavior normal.        Thought Content: Thought content normal. Thought content is not paranoid or delusional. Thought content does not include homicidal or suicidal ideation. Thought content does  not include homicidal or suicidal plan.        Cognition and Memory: Cognition and memory normal.        Judgment: Judgment normal.     Comments: Insight intact     Lab Review:     Component Value Date/Time   NA 139 09/24/2023 0602   K 4.5 09/24/2023 0602   CL 101 09/24/2023 0602   CO2 29 09/24/2023 0602   GLUCOSE 113 (H) 09/24/2023 0602   BUN 14 09/24/2023 0602   CREATININE 0.59 09/24/2023 0602   CALCIUM 9.0 09/24/2023 0602   PROT 6.8 03/26/2023 1620   ALBUMIN 4.6 03/26/2023 1620   AST 12 (L) 03/26/2023 1620   ALT <5 03/26/2023 1620   ALKPHOS 54 03/26/2023 1620   BILITOT 0.6 03/26/2023 1620  GFRNONAA >60 09/24/2023 0602       Component Value Date/Time   WBC 8.0 09/24/2023 0602   RBC 4.13 09/24/2023 0602   HGB 11.9 (L) 09/24/2023 0602   HCT 36.5 09/24/2023 0602   PLT 247 09/24/2023 0602   MCV 88.4 09/24/2023 0602   MCH 28.8 09/24/2023 0602   MCHC 32.6 09/24/2023 0602   RDW 13.2 09/24/2023 0602   LYMPHSABS 0.7 09/24/2023 0602   MONOABS 0.9 09/24/2023 0602   EOSABS 0.1 09/24/2023 0602   BASOSABS 0.0 09/24/2023 0602    No results found for: POCLITH, LITHIUM   No results found for: PHENYTOIN, PHENOBARB, VALPROATE, CBMZ   .res Assessment: Plan:    Plan:  PDMP reviewed  Zoloft  100mg  daily Trazadone 100mg  - take 1 to 2 tablets at hs  Seroquel  25mg  at hs  Also taking Nuplazid , 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 Greater than 50% of face to face time with patient was spent on counseling and coordination of care.    Patient advised to contact office with any questions, adverse effects, or acute worsening in signs and symptoms.  Discussed potential benefits, risk, and side effects of benzodiazepines to include potential risk of tolerance and dependence, as well as possible drowsiness.  Advised patient not to drive if experiencing drowsiness and to take lowest possible effective  dose to minimize risk of dependence and tolerance.  There are no diagnoses linked to this encounter.   Please see After Visit Summary for patient specific instructions.  Future Appointments  Date Time Provider Department Center  11/06/2024 11:20 AM [facility]-ANNUAL WELLNESS VISIT [facility]    No orders of the defined types were placed in this encounter.   -------------------------------

## 2023-11-16 DIAGNOSIS — G4700 Insomnia, unspecified: Secondary | ICD-10-CM | POA: Diagnosis not present

## 2023-11-16 DIAGNOSIS — F0284 Dementia in other diseases classified elsewhere, unspecified severity, with anxiety: Secondary | ICD-10-CM | POA: Diagnosis not present

## 2023-11-16 DIAGNOSIS — G3183 Dementia with Lewy bodies: Secondary | ICD-10-CM | POA: Diagnosis not present

## 2023-11-16 DIAGNOSIS — I951 Orthostatic hypotension: Secondary | ICD-10-CM | POA: Diagnosis not present

## 2023-11-16 DIAGNOSIS — E039 Hypothyroidism, unspecified: Secondary | ICD-10-CM | POA: Diagnosis not present

## 2023-11-16 DIAGNOSIS — G20A1 Parkinson's disease without dyskinesia, without mention of fluctuations: Secondary | ICD-10-CM | POA: Diagnosis not present

## 2023-11-16 DIAGNOSIS — G2401 Drug induced subacute dyskinesia: Secondary | ICD-10-CM | POA: Diagnosis not present

## 2023-11-16 DIAGNOSIS — M800AXD Age-related osteoporosis with current pathological fracture, other site, subsequent encounter for fracture with routine healing: Secondary | ICD-10-CM | POA: Diagnosis not present

## 2023-11-16 DIAGNOSIS — F02818 Dementia in other diseases classified elsewhere, unspecified severity, with other behavioral disturbance: Secondary | ICD-10-CM | POA: Diagnosis not present

## 2023-11-22 DIAGNOSIS — F02818 Dementia in other diseases classified elsewhere, unspecified severity, with other behavioral disturbance: Secondary | ICD-10-CM | POA: Diagnosis not present

## 2023-11-22 DIAGNOSIS — F0284 Dementia in other diseases classified elsewhere, unspecified severity, with anxiety: Secondary | ICD-10-CM | POA: Diagnosis not present

## 2023-11-22 DIAGNOSIS — G4700 Insomnia, unspecified: Secondary | ICD-10-CM | POA: Diagnosis not present

## 2023-11-22 DIAGNOSIS — G3183 Dementia with Lewy bodies: Secondary | ICD-10-CM | POA: Diagnosis not present

## 2023-11-22 DIAGNOSIS — E039 Hypothyroidism, unspecified: Secondary | ICD-10-CM | POA: Diagnosis not present

## 2023-11-22 DIAGNOSIS — G20A1 Parkinson's disease without dyskinesia, without mention of fluctuations: Secondary | ICD-10-CM | POA: Diagnosis not present

## 2023-11-22 DIAGNOSIS — G2401 Drug induced subacute dyskinesia: Secondary | ICD-10-CM | POA: Diagnosis not present

## 2023-11-22 DIAGNOSIS — I951 Orthostatic hypotension: Secondary | ICD-10-CM | POA: Diagnosis not present

## 2023-11-22 DIAGNOSIS — M800AXD Age-related osteoporosis with current pathological fracture, other site, subsequent encounter for fracture with routine healing: Secondary | ICD-10-CM | POA: Diagnosis not present

## 2023-11-28 DIAGNOSIS — G20B2 Parkinson's disease with dyskinesia, with fluctuations: Secondary | ICD-10-CM | POA: Diagnosis not present

## 2023-11-28 DIAGNOSIS — R296 Repeated falls: Secondary | ICD-10-CM | POA: Diagnosis not present

## 2023-11-28 DIAGNOSIS — K117 Disturbances of salivary secretion: Secondary | ICD-10-CM | POA: Diagnosis not present

## 2023-11-28 DIAGNOSIS — I959 Hypotension, unspecified: Secondary | ICD-10-CM | POA: Diagnosis not present

## 2023-12-06 DIAGNOSIS — G20A1 Parkinson's disease without dyskinesia, without mention of fluctuations: Secondary | ICD-10-CM | POA: Diagnosis not present

## 2023-12-06 DIAGNOSIS — F0284 Dementia in other diseases classified elsewhere, unspecified severity, with anxiety: Secondary | ICD-10-CM | POA: Diagnosis not present

## 2023-12-06 DIAGNOSIS — F02818 Dementia in other diseases classified elsewhere, unspecified severity, with other behavioral disturbance: Secondary | ICD-10-CM | POA: Diagnosis not present

## 2023-12-06 DIAGNOSIS — G2401 Drug induced subacute dyskinesia: Secondary | ICD-10-CM | POA: Diagnosis not present

## 2023-12-06 DIAGNOSIS — I951 Orthostatic hypotension: Secondary | ICD-10-CM | POA: Diagnosis not present

## 2023-12-06 DIAGNOSIS — G3183 Dementia with Lewy bodies: Secondary | ICD-10-CM | POA: Diagnosis not present

## 2023-12-06 DIAGNOSIS — M800AXD Age-related osteoporosis with current pathological fracture, other site, subsequent encounter for fracture with routine healing: Secondary | ICD-10-CM | POA: Diagnosis not present

## 2023-12-06 DIAGNOSIS — E039 Hypothyroidism, unspecified: Secondary | ICD-10-CM | POA: Diagnosis not present

## 2023-12-06 DIAGNOSIS — G4700 Insomnia, unspecified: Secondary | ICD-10-CM | POA: Diagnosis not present

## 2023-12-12 DIAGNOSIS — R350 Frequency of micturition: Secondary | ICD-10-CM | POA: Diagnosis not present

## 2024-01-08 DIAGNOSIS — K117 Disturbances of salivary secretion: Secondary | ICD-10-CM | POA: Diagnosis not present

## 2024-01-09 DIAGNOSIS — R350 Frequency of micturition: Secondary | ICD-10-CM | POA: Diagnosis not present

## 2024-01-18 DIAGNOSIS — M25552 Pain in left hip: Secondary | ICD-10-CM | POA: Diagnosis not present

## 2024-01-18 DIAGNOSIS — M25511 Pain in right shoulder: Secondary | ICD-10-CM | POA: Diagnosis not present

## 2024-01-22 ENCOUNTER — Telehealth: Payer: Self-pay | Admitting: *Deleted

## 2024-01-22 DIAGNOSIS — G20B2 Parkinson's disease with dyskinesia, with fluctuations: Secondary | ICD-10-CM

## 2024-01-22 NOTE — Telephone Encounter (Signed)
 Referral placed.

## 2024-01-22 NOTE — Telephone Encounter (Signed)
 Copied from CRM [id number]. Topic: Clinical - Medical Advice >> Jan 22, 2024 11:12 AM Kelly Howard wrote: Reason for CRM: pt broke pelvis in april, treatement went ok, pt was getting monthly injections, but the injections have stopped , do she need to get the injections started again? pt also have parkinsons, the neurologist wanted her to contact pcp to get nutristionist support, the weight lost is substanstial. pcp is aware of the weight loss . Pt callback # is [phone] [name]

## 2024-01-23 ENCOUNTER — Telehealth: Payer: Self-pay | Admitting: Internal Medicine

## 2024-01-23 NOTE — Telephone Encounter (Signed)
 Information sent via MyChart.

## 2024-01-23 NOTE — Telephone Encounter (Signed)
 Copied from CRM [id number]. Topic: General - Other >> Jan 23, 2024 12:06 PM Henretta I wrote: Reason for CRM: Patients husband called and wanted to know if number for Nutritionist can be texted to him or put in [place] so he can contact them

## 2024-01-30 DIAGNOSIS — H2513 Age-related nuclear cataract, bilateral: Secondary | ICD-10-CM | POA: Diagnosis not present

## 2024-01-30 DIAGNOSIS — H5203 Hypermetropia, bilateral: Secondary | ICD-10-CM | POA: Diagnosis not present

## 2024-02-06 DIAGNOSIS — R350 Frequency of micturition: Secondary | ICD-10-CM | POA: Diagnosis not present

## 2024-02-12 DIAGNOSIS — Z124 Encounter for screening for malignant neoplasm of cervix: Secondary | ICD-10-CM | POA: Diagnosis not present

## 2024-02-12 DIAGNOSIS — N958 Other specified menopausal and perimenopausal disorders: Secondary | ICD-10-CM | POA: Diagnosis not present

## 2024-02-12 DIAGNOSIS — Z1231 Encounter for screening mammogram for malignant neoplasm of breast: Secondary | ICD-10-CM | POA: Diagnosis not present

## 2024-02-12 DIAGNOSIS — Z681 Body mass index (BMI) 19 or less, adult: Secondary | ICD-10-CM | POA: Diagnosis not present

## 2024-02-12 DIAGNOSIS — M816 Localized osteoporosis [Lequesne]: Secondary | ICD-10-CM | POA: Diagnosis not present

## 2024-02-12 DIAGNOSIS — Z01419 Encounter for gynecological examination (general) (routine) without abnormal findings: Secondary | ICD-10-CM | POA: Diagnosis not present

## 2024-02-15 DIAGNOSIS — M25552 Pain in left hip: Secondary | ICD-10-CM | POA: Diagnosis not present

## 2024-02-15 DIAGNOSIS — M545 Low back pain, unspecified: Secondary | ICD-10-CM | POA: Diagnosis not present

## 2024-02-17 ENCOUNTER — Emergency Department (HOSPITAL_COMMUNITY)

## 2024-02-17 ENCOUNTER — Other Ambulatory Visit: Payer: Self-pay

## 2024-02-17 ENCOUNTER — Emergency Department (HOSPITAL_COMMUNITY)
Admission: EM | Admit: 2024-02-17 | Discharge: 2024-02-18 | Disposition: A | Discharge status: Home / Self Care | Source: Ambulatory Visit | Attending: Emergency Medicine | Admitting: Emergency Medicine

## 2024-02-17 DIAGNOSIS — M25552 Pain in left hip: Secondary | ICD-10-CM | POA: Diagnosis not present

## 2024-02-17 DIAGNOSIS — M1612 Unilateral primary osteoarthritis, left hip: Secondary | ICD-10-CM | POA: Diagnosis not present

## 2024-02-17 DIAGNOSIS — W19XXXA Unspecified fall, initial encounter: Secondary | ICD-10-CM | POA: Insufficient documentation

## 2024-02-17 DIAGNOSIS — Z043 Encounter for examination and observation following other accident: Secondary | ICD-10-CM | POA: Diagnosis not present

## 2024-02-17 DIAGNOSIS — S79922A Unspecified injury of left thigh, initial encounter: Secondary | ICD-10-CM | POA: Diagnosis not present

## 2024-02-17 LAB — CBC WITH DIFFERENTIAL/PLATELET
Abs Immature Granulocytes: 0.03 K/uL (ref 0.00–0.07)
Basophils Absolute: 0 K/uL (ref 0.0–0.1)
Basophils Relative: 0 %
Eosinophils Absolute: 0.1 K/uL (ref 0.0–0.5)
Eosinophils Relative: 1 %
HCT: 37.7 % (ref 36.0–46.0)
Hemoglobin: 11.6 g/dL — ABNORMAL LOW (ref 12.0–15.0)
Immature Granulocytes: 0 %
Lymphocytes Relative: 5 %
Lymphs Abs: 0.4 K/uL — ABNORMAL LOW (ref 0.7–4.0)
MCH: 27.4 pg (ref 26.0–34.0)
MCHC: 30.8 g/dL (ref 30.0–36.0)
MCV: 88.9 fL (ref 80.0–100.0)
Monocytes Absolute: 0.7 K/uL (ref 0.1–1.0)
Monocytes Relative: 9 %
Neutro Abs: 6.5 K/uL (ref 1.7–7.7)
Neutrophils Relative %: 85 %
Platelets: 195 K/uL (ref 150–400)
RBC: 4.24 MIL/uL (ref 3.87–5.11)
RDW: 14.1 % (ref 11.5–15.5)
WBC: 7.7 K/uL (ref 4.0–10.5)
nRBC: 0 % (ref 0.0–0.2)

## 2024-02-17 LAB — TYPE AND SCREEN
ABO/RH(D): O POS
Antibody Screen: NEGATIVE

## 2024-02-17 LAB — COMPREHENSIVE METABOLIC PANEL WITH GFR
ALT: 7 U/L (ref 0–44)
AST: 19 U/L (ref 15–41)
Albumin: 4.2 g/dL (ref 3.5–5.0)
Alkaline Phosphatase: 90 U/L (ref 38–126)
Anion gap: 14 (ref 5–15)
BUN: 21 mg/dL (ref 8–23)
CO2: 22 mmol/L (ref 22–32)
Calcium: 9.3 mg/dL (ref 8.9–10.3)
Chloride: 101 mmol/L (ref 98–111)
Creatinine, Ser: 0.58 mg/dL (ref 0.44–1.00)
GFR, Estimated: >60 mL/min (ref >60)
Glucose, Bld: 116 mg/dL — ABNORMAL HIGH (ref 70–99)
Potassium: 3.8 mmol/L (ref 3.5–5.1)
Sodium: 137 mmol/L (ref 135–145)
Total Bilirubin: 0.6 mg/dL (ref 0.0–1.2)
Total Protein: 6.7 g/dL (ref 6.5–8.1)

## 2024-02-17 MED ORDER — LORAZEPAM 2 MG/ML IJ SOLN
1.0000 mg | Freq: Once | INTRAMUSCULAR | Status: AC
  Administered 2024-02-17: 1 mg via INTRAVENOUS
  Filled 2024-02-17: qty 1

## 2024-02-17 MED ORDER — LORAZEPAM 2 MG/ML IJ SOLN: 0.5000 mg | Freq: Once | INTRAMUSCULAR | Status: DC

## 2024-02-17 NOTE — ED Triage Notes (Signed)
 Pt has c/o left hip pain, unable to walk after a fall on 9/15

## 2024-02-17 NOTE — ED Provider Notes (Signed)
 Kelly Howard   CSN: [id number] Arrival date & time: 02/17/24  1517     Patient presents with: Hip Pain   Kelly Howard is a 73 y.o. female.  {Add pertinent medical, surgical, social history, OB history to HPI:4614} 73 year old female with prior medical history as detailed below presents for evaluation.  Patient's husband provides majority of history.  Per husband, patient fell approximately 5 days ago.  She has complained of persistent left hip pain since the fall.  She did not injure anything else in the fall.  She is otherwise acting her normal self.  She is anxious and agitated on initial evaluation.  This appears to be her baseline.    The history is provided by the patient, a caregiver and medical records.       Prior to Admission medications   Medication Sig Start Date End Date Taking? Authorizing Provider  acetaminophen  (TYLENOL ) 500 MG tablet Take 1.5 tablets (750 mg total) by mouth every 6 (six) hours. 09/26/23   Samtani, Jai-Gurmukh, MD  AMBULATORY NON FORMULARY MEDICATION Abdominal compression binder Dx: G20 11/07/19   TatAsberry RAMAN, DO  Carbidopa -Levodopa  ER (RYTARY ) 48.75-195 MG CPCR Take 10 tablets by mouth See admin instructions. Take 2 tablets by mouth at  6am, then take  2 tablets by mouth at 10am, then take 2 tablets by mouth  at 2pm, then take 2 tablets by mouth  at 6 pm, and  take 2 tablets by mouth  at 10 pm per spouse    [provider]  cyanocobalamin  (VITAMIN B12) 1000 MCG/ML injection Inject into the muscle every 30 (thirty) days.    [provider]  denosumab (PROLIA) 60 MG/ML SOSY injection Inject 60 mg into the skin every 6 (six) months. 01/28/22   [provider]  INBRIJA  42 MG CAPS Take 1 capsule by mouth daily as needed (mobility per spouse).    [provider]  levothyroxine  (SYNTHROID , LEVOTHROID) 50 MCG tablet Take 50 mcg by mouth daily before  breakfast. 03/09/12   [provider]  NUPLAZID  34 MG CAPS Take 1 capsule by mouth daily. 01/30/21   [provider]  QUEtiapine  (SEROQUEL ) 25 MG tablet Take 1 tablet (25 mg total) by mouth at bedtime. 11/14/23   Mozingo, Regina Nattalie, NP  rivastigmine  (EXELON ) 9.5 mg/24hr Place 9.5 mg onto the skin daily.    [provider]  sertraline  (ZOLOFT ) 100 MG tablet TAKE ONE TABLET DAILY. 09/26/23   Samtani, Jai-Gurmukh, MD  traZODone  (DESYREL ) 50 MG tablet Take one to three tablets at bedtime. 09/26/23   Samtani, Jai-Gurmukh, MD  Vibegron 75 MG TABS Take 75 mg by mouth at bedtime.    [provider]  VITAMIN D  PO Take 1 capsule by mouth daily.    [provider]  XEOMIN 50 units SOLR injection Inject 50 Units into the muscle every 3 (three) months.    [provider]    Allergies: Codeine, Escitalopram, Pramipexole, and Propoxyphene    Review of Systems  All other systems reviewed and are negative.   Updated Vital Signs BP 133/83 (BP Location: Right Arm)   Pulse 77   Temp 98.6 F (37 C) (Oral)   Resp 16   LMP  (LMP Unknown) Comment: gyn  SpO2 98%   Physical Exam Vitals and nursing Howard reviewed.  Constitutional:      General: She is not in acute distress.    Appearance: Normal appearance. She  is well-developed.  HENT:     Head: Normocephalic and atraumatic.  Eyes:     Conjunctiva/sclera: Conjunctivae normal.     Pupils: Pupils are equal, round, and reactive to light.  Cardiovascular:     Rate and Rhythm: Normal rate and regular rhythm.     Heart sounds: Normal heart sounds.  Pulmonary:     Effort: Pulmonary effort is normal. No respiratory distress.     Breath sounds: Normal breath sounds.  Abdominal:     General: There is no distension.     Palpations: Abdomen is soft.     Tenderness: There is no abdominal tenderness.  Musculoskeletal:        General: No deformity. Normal range of motion.     Cervical back: Normal range of  motion and neck supple.  Skin:    General: Skin is warm and dry.  Neurological:     General: No focal deficit present.     Mental Status: She is alert and oriented to person, place, and time.     (all labs ordered are listed, but only abnormal results are displayed) Labs Reviewed  CBC WITH DIFFERENTIAL/PLATELET - Abnormal; Notable for the following components:      Result Value   Hemoglobin 11.6 (*)    Lymphs Abs 0.4 (*)    All other components within normal limits  COMPREHENSIVE METABOLIC PANEL WITH GFR  TYPE AND SCREEN    EKG: None  Radiology: DG Hip Unilat With Pelvis 2-3 Views Left Result Date: 02/17/2024 CLINICAL DATA:  Status post fall on February 12, 2024 with subsequent left hip pain. EXAM: DG HIP (WITH OR WITHOUT PELVIS) 2-3V LEFT COMPARISON:  September 22, 2023 FINDINGS: A cortical deformity of indeterminate age is seen along the inferior aspect of the left iliac wing. This is not clearly identified on the prior study. There is no evidence of dislocation. Marked severity degenerative changes are seen in the form of joint space narrowing, acetabular sclerosis and subchondral cyst formation. IMPRESSION: 1. Cortical deformity of indeterminate age along the inferior aspect of the left iliac wing. CT correlation is recommended to further exclude the presence of an underlying pelvic fracture. 2. Marked severity degenerative changes of the left hip. Electronically Signed   By: Suzen Dials M.D.   On: 02/17/2024 17:00    {Document cardiac monitor, telemetry assessment procedure when appropriate:32947} Procedures   Medications Ordered in the ED  LORazepam  (ATIVAN ) injection 1 mg (1 mg Intravenous Given 02/17/24 2113)      {Click here for ABCD2, HEART and other calculators REFRESH Howard before signing:1}                              Medical Decision Making Patient presents with left hip pain.  Patient with fall approximately 5 days ago.  Per husband who is the primary caregiver  at home, patient has had significant difficulty ambulating after this fall.  Patient's husband feels that she is not safe to return home.  He is requesting evaluation by social work for possible placement.  In April, patient was placed at [facility] after similar fall with resulting left pelvic fracture.  Amount and/or Complexity of Data Reviewed Labs: ordered. Radiology: ordered.  Risk Prescription drug management.     {Document critical care time when appropriate  Document review of labs and clinical decision tools ie CHADS2VASC2, etc  Document your independent review of radiology images and any outside records  Document your discussion with family members, caretakers and with consultants  Document social determinants of health affecting pt's care  Document your decision making why or why not admission, treatments were needed:32947:::1}   Final diagnoses:  None    ED Discharge Orders     None

## 2024-02-18 MED ORDER — LEVOTHYROXINE SODIUM 50 MCG PO TABS
50.0000 ug | ORAL_TABLET | Freq: Every day | ORAL | Status: DC
Start: 2024-02-19 — End: 2024-02-18

## 2024-02-18 MED ORDER — CARBIDOPA-LEVODOPA ER 48.75-195 MG PO CPCR
10.0000 | ORAL_CAPSULE | ORAL | Status: DC
Start: 2024-02-18 — End: 2024-02-18

## 2024-02-18 MED ORDER — TRAZODONE HCL 50 MG PO TABS: 50.0000 mg | ORAL_TABLET | Freq: Every day | ORAL | Status: DC

## 2024-02-18 NOTE — ED Provider Notes (Signed)
 Patient has a negative CT of her hip for any acute fractures.  She has seen physical therapy who recommends that she would do better at home given her dementia as she was able to ambulate here.  Her aide was present but her spouse is out of town.  Her aide feels that she could assist her and patient seems more mobile now at this time as she had before.  She was ordered home health with a face-to-face for PT, OT, social work.  She does have an aide as well.  At this time patient appears stable for discharge.   Doretha Folks, MD 02/18/24 [phone]

## 2024-02-18 NOTE — ED Notes (Signed)
 Transfer of care to holding area from ED. Patient is anxious about change of environment. Attempted redirection, daughter at bedside.

## 2024-02-18 NOTE — Progress Notes (Signed)
 Awaiting PT eval. Requested EDP place order.

## 2024-02-18 NOTE — Care Management (Signed)
 Transition of Care [facility]) - Emergency Department Mini Assessment   Patient Details  Name: Kelly Howard MRN: [id number] Date of Birth: 07/19/1950  [facility] CM/SW Contact:    Corean JAYSON Canary, RN Phone Number: 02/18/2024, 4:50 PM   Clinical Narrative:  Reviewed patient notes, presented to ED with ongoing pain difficulty waling after a fall, baseline dementia.  She has a husband at home and aides during day Monday through Friday. PT has worked with her and recommended home health. Secured home health with [facility] the patient has no HH history. Called and spoke to husband, who is agreeable to home health. He is currently in [place], would ike her to stay until tomorrow, but  understands the patient is discharged, and would do better in her home environment.  She has DME at home. Message left with him that his wife is discharged and to call [facility] when he is on the way ED Mini Assessment: What brought you to the Emergency Department? 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

## 2024-02-18 NOTE — Discharge Instructions (Signed)
 X-rays were okay today without signs of new breaks.  She we will need to continue working with physical therapy and home health was ordered for her.  She can continue to take extra strength Tylenol  2 tabs every 6 hours as needed for pain and you can also use over-the-counter lidocaine  patches or Voltaren gel.

## 2024-02-18 NOTE — ED Notes (Signed)
 Spoke to patients family and gave update on status.

## 2024-02-18 NOTE — Evaluation (Signed)
 Physical Therapy Evaluation Patient Details Name: Kelly Howard MRN: [id number] DOB: Jul 03, 1950 Today's Date: 02/18/2024  History of Present Illness  Pt is a 73 year old female who presented to ED on 02/17/24 from home due to decreased functional status and left hip pain since fall on Thurs.  Imaging negative for acute fractures.  Parkinson's disease with Lewy body dementia, hypothyroidism, osteoporosis, OAB, falls (recent admission 08/2023 with acute comminuted fx of L iliac wing)  Clinical Impression  Pt seen in ED.  Pt currently with functional limitations due to the deficits listed below (see PT Problem List). Pt will benefit from acute skilled PT to increase their independence and safety with mobility to allow discharge.  Pt's aide present and reports pt seen by Ortho office on Thurs after fall and xray reported no fx, pt also received an injection however she was uncertain name.  Aide not typically present on the weekend and spouse apparently had difficulty assisting pt with mobility at home, so pt presented to ED.  Pt currently able to mobilize OOB and ambulate short distance with RW and only requiring CGA for safety.  Pt does require multimodal cues for technique however also with hx of dementia.  Aide feels pt is mobilizing much better and feels able to assist pt at home.  Spouse not present for session today.  Pt would likely thrive better in home environment upon d/c since pain appears improved, and pt is able to ambulate (however if spouse feels unable, may need to consider inpatient follow up therapy, <3 hours/day). Recommending HHPT.  Aide reports pt has DME.          If plan is discharge home, recommend the following: A little help with walking and/or transfers;A little help with bathing/dressing/bathroom;Assistance with cooking/housework;Help with stairs or ramp for entrance;Assist for transportation;Supervision due to cognitive status   Can travel by private vehicle         Equipment Recommendations None recommended by PT  Recommendations for Other Services       Functional Status Assessment Patient has had a recent decline in their functional status and demonstrates the ability to make significant improvements in function in a reasonable and predictable amount of time.     Precautions / Restrictions Precautions Precautions: Fall Recall of Precautions/Restrictions: Impaired      Mobility  Bed Mobility Overal bed mobility: Needs Assistance Bed Mobility: Supine to Sit     Supine to sit: Contact guard, HOB elevated     General bed mobility comments: cues for technique, pt slowly performed with cues however no physical assist required    Transfers Overall transfer level: Needs assistance Equipment used: Rolling walker (2 wheels) Transfers: Sit to/from Stand Sit to Stand: Contact guard assist           General transfer comment: multimodal cues for technique and self assist; CGA for safety    Ambulation/Gait Ambulation/Gait assistance: Contact guard assist Gait Distance (Feet): 50 Feet Assistive device: Rolling walker (2 wheels) Gait Pattern/deviations: Step-to pattern, Decreased stance time - left, Narrow base of support, Antalgic Gait velocity: decr     General Gait Details: small steps, pt initiated step to pattern leading with L LE, utilized RW for stability and pain control; cues for turning required but pt CGA  Stairs            Wheelchair Mobility     Tilt Bed    Modified Rankin (Stroke Patients Only)       Balance Overall balance assessment:  History of Falls                                           Pertinent Vitals/Pain Pain Assessment Pain Assessment: Faces Faces Pain Scale: Hurts a little bit Pain Location: left hip Pain Descriptors / Indicators: Grimacing, Discomfort Pain Intervention(s): Repositioned, Monitored during session    Home Living Family/patient expects to be discharged  to:: Private residence Living Arrangements: Spouse/significant other Available Help at Discharge: Family;Personal care attendant;Available 24 hours/day Type of Home: House Home Access: Level entry       Home Layout: Two level;Able to live on main level with bedroom/bathroom Home Equipment: Rolling Walker (2 wheels);Cane - single point;Shower seat - built in;Grab bars - toilet;Grab bars - tub/shower;Rollator (4 wheels) Additional Comments: aide 5 days/week from 9am-2pm    Prior Function Prior Level of Function : Needs assist;History of Falls (last six months)  Cognitive Assist : ADLs (cognitive);Mobility (cognitive)           Mobility Comments: pt not using an assistive device until fall on Thurs, aide present and reports pt seen by Dr. Fransisco office and xray revealed no fx, pt requiring more assist from spouse over the weekend so pt brought to ED, aide states spouse reported he had to physically assist with transfers and utilized rollator seat to push pt in home ADLs Comments: assist for ADLs, IADLs     Extremity/Trunk Assessment        Lower Extremity Assessment Lower Extremity Assessment: Generalized weakness;LLE deficits/detail LLE Deficits / Details: pain with left LE movement however pt actively moving without assist to mobilize       Communication   Communication Communication: Impaired Factors Affecting Communication: Hearing impaired    Cognition Arousal: Alert Behavior During Therapy: Flat affect   PT - Cognitive impairments: History of cognitive impairments                       PT - Cognition Comments: hx dementia Following commands: Intact       Cueing Cueing Techniques: Verbal cues, Tactile cues, Visual cues     General Comments      Exercises     Assessment/Plan    PT Assessment Patient needs continued PT services  PT Problem List Decreased strength;Decreased activity tolerance;Decreased balance;Decreased knowledge of use of  DME;Decreased mobility;Pain;Decreased cognition       PT Treatment Interventions DME instruction;Gait training;Therapeutic exercise;Balance training;Functional mobility training;Therapeutic activities;Patient/family education    PT Goals (Current goals can be found in the Care Plan section)  Acute Rehab PT Goals PT Goal Formulation: With patient Time For Goal Achievement: 03/03/24 Potential to Achieve Goals: Good    Frequency Min 2X/week     Co-evaluation               AM-PAC PT 6 Clicks Mobility  Outcome Measure Help needed turning from your back to your side while in a flat bed without using bedrails?: A Little Help needed moving from lying on your back to sitting on the side of a flat bed without using bedrails?: A Little Help needed moving to and from a bed to a chair (including a wheelchair)?: A Little Help needed standing up from a chair using your arms (e.g., wheelchair or bedside chair)?: A Little Help needed to walk in hospital room?: A Little Help needed climbing 3-5 steps with a railing? :  A Little 6 Click Score: 18    End of Session Equipment Utilized During Treatment: Gait belt Activity Tolerance: Patient tolerated treatment well Patient left: in chair;with call bell/phone within reach;with family/visitor present   PT Visit Diagnosis: Difficulty in walking, not elsewhere classified (R26.2);History of falling (Z91.81)    Time: 8485-8461 PT Time Calculation (min) (ACUTE ONLY): 24 min   Charges:   PT Evaluation $PT Eval Low Complexity: 1 Low PT Treatments $Gait Training: 8-22 mins PT General Charges $$ ACUTE PT VISIT: 1 Visit        Tari PT, DPT Physical Therapist Acute Rehabilitation Services Office: [phone]   Tari CROME Payson 02/18/2024, 4:22 PM

## 2024-02-18 NOTE — ED Provider Notes (Signed)
 Emergency Medicine Observation Re-evaluation Note  Kelly Howard is a 73 y.o. female, seen on rounds today.  Pt initially presented to the ED for complaints of Hip Pain Currently, the patient is resting  Physical Exam  BP (!) 161/104 (BP Location: Right Arm)   Pulse 68   Temp 97.9 F (36.6 C) (Oral)   Resp 18   LMP  (LMP Unknown) Comment: gyn  SpO2 98%  Physical Exam General: nad Cardiac: rr Lungs: non labored Psych: calm  ED Course / MDM  EKG:   I have reviewed the labs performed to date as well as medications administered while in observation.  Recent changes in the last 24 hours include none.  Plan  Current plan is for PT/OT eval and possible placement. Kelly Neysa Caron JINNY, DO 02/18/24 1428

## 2024-02-21 ENCOUNTER — Telehealth: Payer: Self-pay | Admitting: *Deleted

## 2024-02-21 NOTE — Telephone Encounter (Signed)
 Copied from CRM #[id number]. Topic: Clinical - Home Health Verbal Orders >> Feb 21, 2024  2:40 PM [place] A wrote: Caller/Agency: April [facility] Callback Number: [phone] Service Requested: Skilled Nursing Frequency: 1 weeks 2  Any new concerns about the patient? No

## 2024-02-21 NOTE — Telephone Encounter (Signed)
 Left a detailed message for April [facility] on her confidential voicemail with verbal orders for skilled nursing.

## 2024-02-23 DIAGNOSIS — G20A1 Parkinson's disease without dyskinesia, without mention of fluctuations: Secondary | ICD-10-CM | POA: Diagnosis not present

## 2024-02-23 DIAGNOSIS — J069 Acute upper respiratory infection, unspecified: Secondary | ICD-10-CM | POA: Diagnosis not present

## 2024-02-23 DIAGNOSIS — M25552 Pain in left hip: Secondary | ICD-10-CM | POA: Diagnosis not present

## 2024-03-01 DIAGNOSIS — M25551 Pain in right hip: Secondary | ICD-10-CM | POA: Diagnosis not present

## 2024-03-05 DIAGNOSIS — G20A1 Parkinson's disease without dyskinesia, without mention of fluctuations: Secondary | ICD-10-CM | POA: Diagnosis not present

## 2024-03-05 DIAGNOSIS — S32302K Unspecified fracture of left ilium, subsequent encounter for fracture with nonunion: Secondary | ICD-10-CM | POA: Diagnosis not present

## 2024-03-05 DIAGNOSIS — M1612 Unilateral primary osteoarthritis, left hip: Secondary | ICD-10-CM | POA: Diagnosis not present

## 2024-03-05 DIAGNOSIS — M62838 Other muscle spasm: Secondary | ICD-10-CM | POA: Diagnosis not present

## 2024-03-05 DIAGNOSIS — M545 Low back pain, unspecified: Secondary | ICD-10-CM | POA: Diagnosis not present

## 2024-03-08 DIAGNOSIS — R8281 Pyuria: Secondary | ICD-10-CM | POA: Diagnosis not present

## 2024-03-08 DIAGNOSIS — R400 Somnolence: Secondary | ICD-10-CM | POA: Diagnosis not present

## 2024-03-14 DIAGNOSIS — R350 Frequency of micturition: Secondary | ICD-10-CM | POA: Diagnosis not present

## 2024-03-15 DIAGNOSIS — M5441 Lumbago with sciatica, right side: Secondary | ICD-10-CM | POA: Diagnosis not present

## 2024-03-15 DIAGNOSIS — M1612 Unilateral primary osteoarthritis, left hip: Secondary | ICD-10-CM | POA: Diagnosis not present

## 2024-03-19 DIAGNOSIS — K117 Disturbances of salivary secretion: Secondary | ICD-10-CM | POA: Diagnosis not present

## 2024-03-19 DIAGNOSIS — G20B2 Parkinson's disease with dyskinesia, with fluctuations: Secondary | ICD-10-CM | POA: Diagnosis not present

## 2024-03-27 DIAGNOSIS — F0280 Dementia in other diseases classified elsewhere without behavioral disturbance: Secondary | ICD-10-CM | POA: Diagnosis not present

## 2024-03-27 DIAGNOSIS — G8911 Acute pain due to trauma: Secondary | ICD-10-CM | POA: Diagnosis not present

## 2024-03-27 DIAGNOSIS — M810 Age-related osteoporosis without current pathological fracture: Secondary | ICD-10-CM | POA: Diagnosis not present

## 2024-03-27 DIAGNOSIS — W19XXXD Unspecified fall, subsequent encounter: Secondary | ICD-10-CM | POA: Diagnosis not present

## 2024-03-27 DIAGNOSIS — G20A1 Parkinson's disease without dyskinesia, without mention of fluctuations: Secondary | ICD-10-CM | POA: Diagnosis not present

## 2024-03-27 DIAGNOSIS — Z8781 Personal history of (healed) traumatic fracture: Secondary | ICD-10-CM | POA: Diagnosis not present

## 2024-03-27 DIAGNOSIS — M549 Dorsalgia, unspecified: Secondary | ICD-10-CM | POA: Diagnosis not present

## 2024-03-27 DIAGNOSIS — Z9181 History of falling: Secondary | ICD-10-CM | POA: Diagnosis not present

## 2024-03-27 DIAGNOSIS — M25552 Pain in left hip: Secondary | ICD-10-CM | POA: Diagnosis not present

## 2024-03-29 ENCOUNTER — Other Ambulatory Visit: Payer: Self-pay | Admitting: Adult Health

## 2024-03-29 DIAGNOSIS — G4700 Insomnia, unspecified: Secondary | ICD-10-CM

## 2024-04-09 DIAGNOSIS — M810 Age-related osteoporosis without current pathological fracture: Secondary | ICD-10-CM | POA: Diagnosis not present

## 2024-04-10 DIAGNOSIS — Z9181 History of falling: Secondary | ICD-10-CM | POA: Diagnosis not present

## 2024-04-11 DIAGNOSIS — R350 Frequency of micturition: Secondary | ICD-10-CM | POA: Diagnosis not present

## 2024-04-17 ENCOUNTER — Ambulatory Visit (INDEPENDENT_AMBULATORY_CARE_PROVIDER_SITE_OTHER)

## 2024-04-17 DIAGNOSIS — M549 Dorsalgia, unspecified: Secondary | ICD-10-CM | POA: Diagnosis not present

## 2024-04-17 DIAGNOSIS — G20A1 Parkinson's disease without dyskinesia, without mention of fluctuations: Secondary | ICD-10-CM | POA: Diagnosis not present

## 2024-04-17 DIAGNOSIS — G8911 Acute pain due to trauma: Secondary | ICD-10-CM | POA: Diagnosis not present

## 2024-04-17 DIAGNOSIS — Z9181 History of falling: Secondary | ICD-10-CM | POA: Diagnosis not present

## 2024-04-17 DIAGNOSIS — Z23 Encounter for immunization: Secondary | ICD-10-CM

## 2024-04-17 DIAGNOSIS — W19XXXD Unspecified fall, subsequent encounter: Secondary | ICD-10-CM | POA: Diagnosis not present

## 2024-04-17 DIAGNOSIS — F0280 Dementia in other diseases classified elsewhere without behavioral disturbance: Secondary | ICD-10-CM | POA: Diagnosis not present

## 2024-04-17 DIAGNOSIS — M25552 Pain in left hip: Secondary | ICD-10-CM | POA: Diagnosis not present

## 2024-04-17 DIAGNOSIS — Z8781 Personal history of (healed) traumatic fracture: Secondary | ICD-10-CM | POA: Diagnosis not present

## 2024-04-19 ENCOUNTER — Emergency Department (HOSPITAL_BASED_OUTPATIENT_CLINIC_OR_DEPARTMENT_OTHER)
Admission: EM | Admit: 2024-04-19 | Discharge: 2024-04-19 | Disposition: A | Discharge status: Home / Self Care | Source: Ambulatory Visit | Attending: Emergency Medicine | Admitting: Emergency Medicine

## 2024-04-19 ENCOUNTER — Emergency Department (HOSPITAL_BASED_OUTPATIENT_CLINIC_OR_DEPARTMENT_OTHER)

## 2024-04-19 ENCOUNTER — Other Ambulatory Visit: Payer: Self-pay

## 2024-04-19 ENCOUNTER — Encounter (HOSPITAL_BASED_OUTPATIENT_CLINIC_OR_DEPARTMENT_OTHER): Payer: Self-pay

## 2024-04-19 DIAGNOSIS — Z79890 Hormone replacement therapy (postmenopausal): Secondary | ICD-10-CM | POA: Insufficient documentation

## 2024-04-19 DIAGNOSIS — S01511A Laceration without foreign body of lip, initial encounter: Secondary | ICD-10-CM | POA: Insufficient documentation

## 2024-04-19 DIAGNOSIS — W19XXXA Unspecified fall, initial encounter: Secondary | ICD-10-CM

## 2024-04-19 DIAGNOSIS — E039 Hypothyroidism, unspecified: Secondary | ICD-10-CM | POA: Diagnosis not present

## 2024-04-19 DIAGNOSIS — S0990XA Unspecified injury of head, initial encounter: Secondary | ICD-10-CM | POA: Diagnosis not present

## 2024-04-19 DIAGNOSIS — S0993XA Unspecified injury of face, initial encounter: Secondary | ICD-10-CM | POA: Diagnosis not present

## 2024-04-19 DIAGNOSIS — W010XXA Fall on same level from slipping, tripping and stumbling without subsequent striking against object, initial encounter: Secondary | ICD-10-CM | POA: Insufficient documentation

## 2024-04-19 DIAGNOSIS — Z043 Encounter for examination and observation following other accident: Secondary | ICD-10-CM | POA: Diagnosis not present

## 2024-04-19 DIAGNOSIS — G20C Parkinsonism, unspecified: Secondary | ICD-10-CM | POA: Insufficient documentation

## 2024-04-19 MED ORDER — LIDOCAINE HCL (PF) 1 % IJ SOLN
5.0000 mL | Freq: Once | INTRAMUSCULAR | Status: AC
  Administered 2024-04-19: 5 mL
  Filled 2024-04-19: qty 5

## 2024-04-19 MED ORDER — LIDOCAINE-EPINEPHRINE-TETRACAINE (LET) TOPICAL GEL
3.0000 mL | Freq: Once | TOPICAL | Status: AC
  Administered 2024-04-19: 3 mL via TOPICAL
  Filled 2024-04-19: qty 3

## 2024-04-19 NOTE — ED Provider Notes (Cosign Needed Addendum)
 [place] EMERGENCY DEPARTMENT AT [facility] Provider Note   CSN: [id number] Arrival date & time: 04/19/24  1318     Patient presents with: Fall, Head Injury, and Lip Laceration   Kelly Howard is a 73 y.o. female.  Patient presents to the emergency department today with concerns of a fall.  Has a history of Parkinson disease, hypothyroidism, osteoporosis, frequent falls, memory loss.  According to caregiver at bedside, patient tripped over her feet resulting in her landing on her knees and then landing face first on the ground.  She only endorses pain to the forehead and to her upper lip due to a laceration.  No reported chest pain, congestion, sore throat, fevers, weakness, lightheadedness, or dizziness prior to her falls.   Fall  Head Injury      Prior to Admission medications   Medication Sig Start Date End Date Taking? Authorizing Provider  acetaminophen  (TYLENOL ) 500 MG tablet Take 1.5 tablets (750 mg total) by mouth every 6 (six) hours. 09/26/23   Samtani, Jai-Gurmukh, MD  AMBULATORY NON FORMULARY MEDICATION Abdominal compression binder Dx: G20 11/07/19   Tat, Asberry RAMAN, DO  Carbidopa -Levodopa  ER (RYTARY ) 48.75-195 MG CPCR Take 10 tablets by mouth See admin instructions. Take 2 tablets by mouth at  6am, then take  2 tablets by mouth at 10am, then take 2 tablets by mouth  at 2pm, then take 2 tablets by mouth  at 6 pm, and  take 2 tablets by mouth  at 10 pm per spouse    [provider]  cyanocobalamin  (VITAMIN B12) 1000 MCG/ML injection Inject into the muscle every 30 (thirty) days.    [provider]  denosumab (PROLIA) 60 MG/ML SOSY injection Inject 60 mg into the skin every 6 (six) months. 01/28/22   [provider]  INBRIJA  42 MG CAPS Take 1 capsule by mouth daily as needed (mobility per spouse).    [provider]  levothyroxine  (SYNTHROID , LEVOTHROID) 50 MCG tablet Take 50 mcg by mouth daily before breakfast. 03/09/12    [provider]  NUPLAZID  34 MG CAPS Take 1 capsule by mouth daily. 01/30/21   [provider]  QUEtiapine  (SEROQUEL ) 25 MG tablet Take 1 tablet (25 mg total) by mouth at bedtime. 11/14/23   Mozingo, Regina Nattalie, NP  rivastigmine  (EXELON ) 9.5 mg/24hr Place 9.5 mg onto the skin daily.    [provider]  sertraline  (ZOLOFT ) 100 MG tablet TAKE ONE TABLET DAILY. 09/26/23   Samtani, Jai-Gurmukh, MD  traZODone  (DESYREL ) 50 MG tablet TAKE 1 TO 2 TABLETS BY MOUTH EVERY NIGHT. 03/31/24   Mozingo, Regina Nattalie, NP  Vibegron 75 MG TABS Take 75 mg by mouth at bedtime.    [provider]  VITAMIN D  PO Take 1 capsule by mouth daily.    [provider]  XEOMIN 50 units SOLR injection Inject 50 Units into the muscle every 3 (three) months.    [provider]    Allergies: Codeine, Escitalopram, Pramipexole, and Propoxyphene    Review of Systems  Skin:  Positive for wound.  All other systems reviewed and are negative.   Updated Vital Signs BP 116/69 (BP Location: Right Arm)   Pulse 88   Temp 98.7 F (37.1 C) (Oral)   Resp 18   Ht 5' 5 (1.651 m)   Wt 42.9 kg   LMP  (LMP Unknown) Comment: gyn  SpO2 99%   BMI 15.74 kg/m   Physical Exam Vitals and nursing note reviewed.  Constitutional:      General: She is not in acute distress.    Appearance: She is well-developed.  HENT:     Head: Normocephalic and atraumatic.     Mouth/Throat:     Comments: No evidence of dental injury or avulsed teeth. Lesion of the left upper lip crosses towards the inner gums but does not approach the dental ridge. Eyes:     Conjunctiva/sclera: Conjunctivae normal.  Cardiovascular:     Rate and Rhythm: Normal rate and regular rhythm.     Heart sounds: No murmur heard. Pulmonary:     Effort: Pulmonary effort is normal. No respiratory distress.     Breath sounds: Normal breath sounds. No wheezing or rales.  Abdominal:     General: Abdomen is flat. Bowel sounds  are normal. There is no distension.     Palpations: Abdomen is soft.     Tenderness: There is no abdominal tenderness. There is no guarding.  Musculoskeletal:        General: No swelling, tenderness, deformity or signs of injury. Normal range of motion.     Cervical back: Neck supple.  Skin:    General: Skin is warm and dry.     Capillary Refill: Capillary refill takes less than 2 seconds.     Findings: Lesion present.     Comments: Small 1cm laceration of the left side upper lip not crossing [place] border. Minimal bleeding.   Neurological:     Mental Status: She is alert.  Psychiatric:        Mood and Affect: Mood normal.     (all labs ordered are listed, but only abnormal results are displayed) Labs Reviewed - No data to display  EKG: None  Radiology: CT Cervical Spine Wo Contrast Result Date: 04/19/2024 EXAM: CT CERVICAL SPINE WITHOUT CONTRAST 04/19/2024 03:38:21 PM TECHNIQUE: CT of the cervical spine was performed without the administration of intravenous contrast. Multiplanar reformatted images are provided for review. Automated exposure control, iterative reconstruction, and/or weight based adjustment of the mA/kV was utilized to reduce the radiation dose to as low as reasonably achievable. COMPARISON: 12/16/2018 CLINICAL HISTORY: mechanical fall FINDINGS: CERVICAL SPINE: BONES AND ALIGNMENT: No acute posttraumatic malalignment of the cervical spine. Straightening of normal cervical lordosis. There is slight anterolisthesis of C2 on C3 and C3 on C4 on the order of 1 to 2 mm at each level. There is a nondisplaced fracture through the left transverse process of the C6 vertebra, noted on axial image 52/7 and coronal image 68/6. This is age indeterminate. No additional signs of acute fracture or subluxation. DEGENERATIVE CHANGES: Multilevel disc space narrowing and endplate spurring are noted from C4-C5 through C6-C7. Bilateral facet arthropathy noted. SOFT TISSUES: No prevertebral  soft tissue swelling. IMPRESSION: 1. Nondisplaced fracture involving the left C6 transverse process, age indeterminate. 2. No additional acute fracture or subluxation. 3. Critical results were called to the ordering provider at the time of interpretation on 04/19/2024. I personally spoke with Dr. Rosan, Sherlean who acknowledged these findings. Electronically signed by: Waddell Calk MD 04/19/2024 04:12 PM EST RP Workstation: [id number]   CT Head Wo Contrast Result Date: 04/19/2024 EXAM: CT HEAD WITHOUT CONTRAST 04/19/2024 03:38:21 PM TECHNIQUE: CT of the head was performed without the administration of intravenous contrast. Automated exposure control, iterative reconstruction, and/or weight based adjustment of the mA/kV was utilized to reduce the radiation dose to as low as reasonably achievable. COMPARISON: 03/26/2023 CLINICAL HISTORY: fall, facial injury FINDINGS: BRAIN AND VENTRICLES: No acute hemorrhage. No  evidence of acute infarct. No hydrocephalus. No extra-axial collection. No mass effect or midline shift. ORBITS: No acute abnormality. SINUSES: Mild mucosal disease in left sphenoid sinus. SOFT TISSUES AND SKULL: No acute soft tissue abnormality. No skull fracture. IMPRESSION: 1. No acute intracranial abnormality. Electronically signed by: Ryan Chess MD 04/19/2024 03:42 PM EST RP Workstation: [id number]     .Laceration Repair  Date/Time: 04/19/2024 6:40 PM  Performed by: Kynadi Dragos A, PA-C Authorized by: Lyna Laningham A, PA-C   Consent:    Consent obtained:  Verbal   Consent given by:  Patient   Risks, benefits, and alternatives were discussed: yes     Risks discussed:  Infection and pain Universal protocol:    Patient identity confirmed:  Verbally with patient, arm band and provided demographic data Anesthesia:    Anesthesia method:  Topical application   Topical anesthetic:  LET Laceration details:    Location:  Lip   Lip location:  Upper exterior lip and upper interior  lip   Length (cm):  1   Depth (mm):  2 Pre-procedure details:    Preparation:  Imaging obtained to evaluate for foreign bodies Exploration:    Contaminated: no   Treatment:    Area cleansed with:  Saline   Irrigation volume:  100cc   Irrigation method:  Pressure wash Skin repair:    Repair method:  Sutures   Suture size:  6-0   Suture material:  Chromic gut   Suture technique:  Simple interrupted   Number of sutures:  1 Approximation:    Approximation:  Close   V[place] border well-aligned: yes   Repair type:    Repair type:  Simple Post-procedure details:    Dressing:  Open (no dressing)   Procedure completion:  Tolerated    Medications Ordered in the ED  lidocaine  (PF) (XYLOCAINE ) 1 % injection 5 mL (5 mLs Other Given 04/19/24 1625)  lidocaine -EPINEPHrine -tetracaine  (LET) topical gel (3 mLs Topical Given 04/19/24 1659)    Clinical Course as of 04/19/24 1841  Fri Apr 19, 2024  1611 Radiology dr joan -- fracture of left transverse process c6 [OZ]    Clinical Course User Index [OZ] Cecily Legrand LABOR, PA-C                                Medical Decision Making Risk Prescription drug management.   This patient presents to the ED for concern of fall.  Differential diagnosis includes head injury, concussion, lip laceration, SDH    Additional history obtained:  Additional history obtained from chart review   Imaging Studies ordered:  I ordered imaging studies including CT head, CT cervical spine I independently visualized and interpreted imaging which showed CT head unremarkable, CT cervical spine concerning for a transverse process fracture at the C6 level, age-indeterminate I agree with the radiologist interpretation   Medicines ordered and prescription drug management:  I ordered medication including LET gel for anesthetic  Reevaluation of the patient after these medicines showed that the patient improved I have reviewed the patients home medicines and  have made adjustments as needed   Problem List / ED Course:  Patient presented to the emergency department with concerns of a fall.  Reportedly had a mechanical fall earlier this this morning that was witnessed.  No reported syncope prior to the fall.  The patient tripped over her feet.  She reported landed on her knees and then fell face forward.  No endorsing pain to the head and her upper lip.  She is not current on blood thinners.  No reported loss of consciousness.  No nausea or vomiting. Physical exam reveals no significant traumatic head injuries.  No appreciable lesions or lacerations on the scalp, forehead, or the face.  The left upper lip appears to have a small laceration that is primarily internal in nature along the gums with some extension towards the left external lip.  There is no crossing of the vermilion border.  No active bleeding at this time. Imaging obtained of the neck and head for further evaluation given patient's mechanism.  No signs of traumatic injury but there is concern for possible C6 transverse process fracture on the left side.  This is age-indeterminate and patient does not have tenderness in this area so unclear if this is a new fracture or not.  Based on chart review, this fracture finding has not been seen on previous imaging but imaging of the neck is most recent as 2020. Consented patient for laceration repair.  Given area of lip involved, will closed with one 6-0 absorbable suture. Patient tolerated closure well after application of LET gel.  Encouraged close follow-up with PCP for wound reevaluation in 48 to 72 hours.  Return precautions discussed such as concerns for recurrent falls or worsening injuries.  Also advised patient to allow wound to heal well on its own but to continue with baseline oral hygiene.  She is otherwise stable for outpatient follow-up and discharged home.   Social Determinants of Health:  None  Final diagnoses:  Fall, initial encounter   Lip laceration, initial encounter    ED Discharge Orders     None          Cecily Legrand LABOR, PA-C 04/19/24 1835    Cecily Legrand LABOR, PA-C 04/19/24 1841    Pamella Ozell LABOR, DO 04/23/24 1147

## 2024-04-19 NOTE — ED Triage Notes (Signed)
 Patient arrives POV accompanies by her caregiver. Patient had a mechanical fall overnight, hitting her head and lacerating her lip. No blood thinner or LOC. Sent here by Urgent Care for scans of her head.

## 2024-04-19 NOTE — Discharge Instructions (Signed)
 You were seen in the emergency department today for concerns of a fall.  Your imaging was thankfully reassuring with no signs of any head bleeding but there was some concerns for a possible fracture of the transverse process of the C6 on your left side.  This may be new from the fall but this could also be an old fracture.  He did not have any significant pain in this area so I suspect this may be from a prior injury.  You did have a laceration to your lip that was repaired here in the emergency department.  The stitches that were placed in here are absorbable.  They will dissolve on their own within the next week or so.  Apply ice to the area to help reduce swelling and bruising.  Return to the emergency department for any concerns or complications.  You should have the wound reevaluated about 2 to 3 days for reassessment to make sure that this is healing well.

## 2024-04-30 DIAGNOSIS — G20A1 Parkinson's disease without dyskinesia, without mention of fluctuations: Secondary | ICD-10-CM | POA: Diagnosis not present

## 2024-04-30 DIAGNOSIS — M1612 Unilateral primary osteoarthritis, left hip: Secondary | ICD-10-CM | POA: Diagnosis not present

## 2024-04-30 DIAGNOSIS — M545 Low back pain, unspecified: Secondary | ICD-10-CM | POA: Diagnosis not present

## 2024-04-30 DIAGNOSIS — M62838 Other muscle spasm: Secondary | ICD-10-CM | POA: Diagnosis not present

## 2024-05-07 DIAGNOSIS — R300 Dysuria: Secondary | ICD-10-CM | POA: Diagnosis not present

## 2024-05-07 DIAGNOSIS — R3915 Urgency of urination: Secondary | ICD-10-CM | POA: Diagnosis not present

## 2024-05-15 ENCOUNTER — Other Ambulatory Visit: Payer: Self-pay | Admitting: Adult Health

## 2024-05-15 ENCOUNTER — Encounter: Payer: Self-pay | Admitting: Adult Health

## 2024-05-15 ENCOUNTER — Ambulatory Visit: Admitting: Adult Health

## 2024-05-15 DIAGNOSIS — G4700 Insomnia, unspecified: Secondary | ICD-10-CM | POA: Diagnosis not present

## 2024-05-15 DIAGNOSIS — F410 Panic disorder [episodic paroxysmal anxiety] without agoraphobia: Secondary | ICD-10-CM | POA: Diagnosis not present

## 2024-05-15 DIAGNOSIS — F331 Major depressive disorder, recurrent, moderate: Secondary | ICD-10-CM | POA: Diagnosis not present

## 2024-05-15 DIAGNOSIS — F411 Generalized anxiety disorder: Secondary | ICD-10-CM | POA: Diagnosis not present

## 2024-05-15 MED ORDER — QUETIAPINE FUMARATE 25 MG PO TABS: 25.0000 mg | ORAL_TABLET | Freq: Every day | ORAL | 3 refills | Status: AC

## 2024-05-15 MED ORDER — TRAZODONE HCL 50 MG PO TABS: ORAL_TABLET | ORAL | 3 refills | Status: AC

## 2024-05-15 MED ORDER — SERTRALINE HCL 100 MG PO TABS: ORAL_TABLET | ORAL | 3 refills | Status: AC

## 2024-05-15 NOTE — Progress Notes (Signed)
 Kelly Howard [id number] 04-Nov-1950 73 y.o.  Subjective:   Patient ID:  Kelly Howard is a 73 y.o. (DOB November 22, 1950) female.  Chief Complaint: No chief complaint on file.   HPI Colleene Swarthout presents to the office today for follow-up of GAD, MDD, panic attacks, and insomnia.  Diagnosed with Parkinson's - followed by [facility].   Accompanied by husband.  Describes mood today as ok. Tearful at times. Pleasant. Mood symptoms - reports anxiety, depression and irritability. Reports stable interest and motivation. Reports panic attacks. Reports some worry, over thinking and rumination. Reports mood is variable. Stating I feel like I want to be normal, but I'm not. Husband supportive. Has a caregiver 27 hours a week. Feels like medications are helpful. Taking medications as prescribed. Energy levels vary. Active, has a regular exercise routine. Enjoys some usual interests and activities. Married. Lives with husband. Has 2 daughters - 3 grandchildren. Spending time with family. Appetite adequate. Weight loss 93 pounds - got down to 89 pounds - working with a nutritionist. Sleeps better some nights than others. Averages 8 hours. Denies daytime napping. Reports focus and concentration difficulties. Completing tasks. Managing some aspects of household. Retired. Denies SI or HI.  Decreased AH. Denies AH. Reports VH.   Previous medication trials: Clonazepam , Zoloft , Paxil, Celexa   GAD-7    Flowsheet Row Office Visit from 09/14/2022 in [facility] [place]  Total GAD-7 Score 6   PHQ2-9    Flowsheet Row Clinical Support from 11/01/2023 in [facility] [place] Office Visit from 03/22/2023 in [facility] [place] Office Visit from 09/14/2022 in [facility] [place] Office Visit from 08/10/2021 in [facility] [place] Office Visit from 04/19/2021  in [facility] [place]  PHQ-2 Total Score 0 1 1 3 4   PHQ-9 Total Score -- 6 7 9 19    Flowsheet Row ED from 04/19/2024 in [facility] Department at [facility] ED from 02[facility]17/2024 in [facility] Emergency Department at [facility] ED to Hosp-Admission (Discharged) from 09/22/2023 in [facility]  C-SSRS RISK CATEGORY No Risk No Risk No Risk     Review of Systems:  Review of Systems  Musculoskeletal:  Negative for gait problem.  Neurological:  Negative for tremors.  Psychiatric/Behavioral:         Please refer to HPI    Medications: I have reviewed the patient's current medications.  Current Outpatient Medications  Medication Sig Dispense Refill   acetaminophen  (TYLENOL ) 500 MG tablet Take 1.5 tablets (750 mg total) by mouth every 6 (six) hours.     AMBULATORY NON FORMULARY MEDICATION Abdominal compression binder Dx: G20 1 Device 0   Carbidopa -Levodopa  ER (RYTARY ) 48.75-195 MG CPCR Take 10 tablets by mouth See admin instructions. Take 2 tablets by mouth at  6am, then take  2 tablets by mouth at 10am, then take 2 tablets by mouth  at 2pm, then take 2 tablets by mouth  at 6 pm, and  take 2 tablets by mouth  at 10 pm per spouse     cyanocobalamin  (VITAMIN B12) 1000 MCG/ML injection Inject into the muscle every 30 (thirty) days.     denosumab (PROLIA) 60 MG/ML SOSY injection Inject 60 mg into the skin every 6 (six) months.     INBRIJA  42 MG CAPS Take 1 capsule by mouth daily as needed (mobility per spouse).  levothyroxine  (SYNTHROID , LEVOTHROID) 50 MCG tablet Take 50 mcg by mouth daily before breakfast.     NUPLAZID  34 MG CAPS Take 1 capsule by mouth daily.     QUEtiapine  (SEROQUEL ) 25 MG tablet Take 1 tablet (25 mg total) by mouth at bedtime. 90 tablet 1   rivastigmine  (EXELON ) 9.5 mg/24hr Place 9.5 mg onto the skin daily.     sertraline  (ZOLOFT ) 100 MG tablet TAKE ONE TABLET DAILY. 90 tablet 3    traZODone  (DESYREL ) 50 MG tablet TAKE 1 TO 2 TABLETS BY MOUTH EVERY NIGHT. 180 tablet 0   Vibegron 75 MG TABS Take 75 mg by mouth at bedtime.     VITAMIN D  PO Take 1 capsule by mouth daily.     XEOMIN 50 units SOLR injection Inject 50 Units into the muscle every 3 (three) months.     No current facility-administered medications for this visit.    Medication Side Effects: None  Allergies: Allergies[1]  Past Medical History:  Diagnosis Date   Frequent falls    Hypothyroidism    Osteoporosis    Parkinson's disease (HCC)     Past Medical History, Surgical history, Social history, and Family history were reviewed and updated as appropriate.   Please see review of systems for further details on the patient's review from today.   Objective:   Physical Exam:  LMP  (LMP Unknown) Comment: gyn  Physical Exam Constitutional:      General: She is not in acute distress. Musculoskeletal:        General: No deformity.  Neurological:     Mental Status: She is alert and oriented to person, place, and time.     Coordination: Coordination normal.  Psychiatric:        Attention and Perception: Attention and perception normal. She does not perceive auditory or visual hallucinations.        Mood and Affect: Mood normal. Mood is not anxious or depressed. Affect is not labile, blunt, angry or inappropriate.        Speech: Speech normal.        Behavior: Behavior normal.        Thought Content: Thought content normal. Thought content is not paranoid or delusional. Thought content does not include homicidal or suicidal ideation. Thought content does not include homicidal or suicidal plan.        Cognition and Memory: Cognition and memory normal.        Judgment: Judgment normal.     Comments: Insight intact     Lab Review:     Component Value Date/Time   NA 137 02/17/2024 2105   K 3.8 02/17/2024 2105   CL 101 02/17/2024 2105   CO2 22 02/17/2024 2105   GLUCOSE 116 (H) 02/17/2024 2105    BUN 21 02/17/2024 2105   CREATININE 0.58 02/17/2024 2105   CALCIUM 9.3 02/17/2024 2105   PROT 6.7 02/17/2024 2105   ALBUMIN 4.2 02/17/2024 2105   AST 19 02/17/2024 2105   ALT 7 02/17/2024 2105   ALKPHOS 90 02/17/2024 2105   BILITOT 0.6 02/17/2024 2105   GFRNONAA >60 02/17/2024 2105       Component Value Date/Time   WBC 7.7 02/17/2024 2105   RBC 4.24 02/17/2024 2105   HGB 11.6 (L) 02/17/2024 2105   HCT 37.7 02/17/2024 2105   PLT 195 02/17/2024 2105   MCV 88.9 02/17/2024 2105   MCH 27.4 02/17/2024 2105   MCHC 30.8 02/17/2024 2105   RDW 14.1 02/17/2024 2105  LYMPHSABS 0.4 (L) 02/17/2024 2105   MONOABS 0.7 02/17/2024 2105   EOSABS 0.1 02/17/2024 2105   BASOSABS 0.0 02/17/2024 2105    No results found for: POCLITH, LITHIUM   No results found for: PHENYTOIN, PHENOBARB, VALPROATE, CBMZ   .res Assessment: Plan:    Plan:  PDMP reviewed  Zoloft  100mg  daily Trazadone 100mg  - take 1 to 2 tablets at hs  Seroquel  25mg  at hs  RTC 6 months  25 minutes spent dedicated to the care of this patient on the date of this encounter to include pre-visit review of records, ordering of medication, post visit documentation, and face-to-face time with the patient discussing depression, anxiety, insomnia and obsessional thoughts. Discussed continuing current medication regimen.  Patient advised to contact office with any questions, adverse effects, or acute worsening in signs and symptoms.  Discussed potential benefits, risk, and side effects of benzodiazepines to include potential risk of tolerance and dependence, as well as possible drowsiness.  Advised patient not to drive if experiencing drowsiness and to take lowest possible effective dose to minimize risk of dependence and tolerance.  There are no diagnoses linked to this encounter.   Please see After Visit Summary for patient specific instructions.  Future Appointments  Date Time Provider Department Center  05/15/2024   2:00 PM Tonyia Marschall Nattalie, NP [facility] None  06/14/2024  3:00 PM Bennett Reuben POUR, MD [facility] [address]  11/06/2024 11:20 AM [facility]-ANNUAL WELLNESS VISIT [facility] [address]    No orders of the defined types were placed in this encounter.   -------------------------------     [1]  Allergies Allergen Reactions   Codeine Other (See Comments)    GI Upset   Escitalopram Swelling and Other (See Comments)    Feet swell   Pramipexole     Other Reaction(s): Psychosis   Propoxyphene Other (See Comments)    Hallucinations  Other Reaction(s): chills, nausea, hallucinogenic, Unknown

## 2024-06-14 ENCOUNTER — Ambulatory Visit

## 2024-06-14 VITALS — BP 102/64 | HR 74 | Temp 98.4°F | Ht 67.0 in | Wt 96.0 lb

## 2024-06-14 DIAGNOSIS — Z136 Encounter for screening for cardiovascular disorders: Secondary | ICD-10-CM

## 2024-06-14 DIAGNOSIS — G3183 Dementia with Lewy bodies: Secondary | ICD-10-CM

## 2024-06-14 DIAGNOSIS — E038 Other specified hypothyroidism: Secondary | ICD-10-CM

## 2024-06-14 DIAGNOSIS — M818 Other osteoporosis without current pathological fracture: Secondary | ICD-10-CM | POA: Diagnosis not present

## 2024-06-14 DIAGNOSIS — E538 Deficiency of other specified B group vitamins: Secondary | ICD-10-CM

## 2024-06-14 DIAGNOSIS — R7303 Prediabetes: Secondary | ICD-10-CM | POA: Diagnosis not present

## 2024-06-14 DIAGNOSIS — G20B2 Parkinson's disease with dyskinesia, with fluctuations: Secondary | ICD-10-CM

## 2024-06-14 DIAGNOSIS — F02811 Dementia in other diseases classified elsewhere, unspecified severity, with agitation: Secondary | ICD-10-CM | POA: Diagnosis not present

## 2024-06-14 MED ORDER — CALCIUM-VITAMIN D3 600-12.5 MG-MCG PO CAPS: 2.0000 | ORAL_CAPSULE | Freq: Every day | ORAL | 3 refills | Status: AC

## 2024-06-14 NOTE — Progress Notes (Unsigned)
 "  Subjective:   This visit was conducted in person. The patient gave informed consent to the use of [employer] AI technology to record the contents of the encounter as documented below.   Patient ID: Kelly Howard, female    DOB: Mar 03, 1951, 74 y.o.   MRN: [id number]   Kelly Howard is a very pleasant 74 y.o. female who presents today as a new patient.  Past medical, surgical and family history: Reviewed and updated in chart.  Allergies: Reviewed and updated in chart.  Medications: Reviewed and updated in chart.  Social history: Reviewed and updated in chart.  Last PCP and reason for leaving: Wanted closer pcp  Discussed the use of AI scribe software for clinical note transcription with the patient, who gave verbal consent to proceed.  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 She is accompanied by her primary caregiver, her husband who manages her medications and daily activities.  She has a history of Parkinson'Howard disease, leading to frequent falls and injuries, including fractures of both wrists and a pelvis fracture in April 2025. A fall in November 2025 resulted in muscle spasms in her lower back. She uses a wheeled walker for mobility and has experienced fewer falls since mid-November 2025. She engages in regular exercise using a recumbent bike at home and has undergone physical therapy, particularly after falls.  She experiences mood issues, anxiety, and memory problems associated with Parkinson'Howard. She takes rivastigmine  for memory issues and Rytary  five times a day for Parkinson'Howard symptoms. Her Rytary  dosage was reduced due to dyskinesia. She also takes Sinemet  at bedtime for overnight coverage and occasionally uses a rescue Rytary  for breathing difficulties at night.  She has a history of low vitamin D  and B12 levels. She takes vitamin D  supplements and had been on B12  injections, which were interrupted after her pelvis injury. Her vitamin D  and B12 levels were last checked a year ago, and she is due for repeat testing. She also takes a calcium  and vitamin D  combination supplement due to osteoporosis and receives Prolia injections every six months.  She has hypothyroidism and takes Synthroid , which she administers first thing in the morning. She also takes Vibegron for urinary frequency, which has significantly improved her symptoms, reducing nighttime awakenings. Additionally, she takes trazodone  and Zoloft  for mood stabilization and sleep, and Seroquel , initially prescribed during her recovery from a pelvis fracture to aid sleep.  She has experienced low blood pressure, contributing to falls in the past. She uses an abdominal compression binder to help manage this issue. Her blood pressure has stabilized recently, with readings around 102/62 mmHg.  She requires assistance with daily activities, including dressing and bathing, and has a caregiver who helps for a few hours each day. Her primary caregiver is available 24/7 to assist with her needs. She has home modifications, such as shower chairs and handlebars, to aid her mobility and safety.    Review of Systems  All other systems reviewed and are negative.       BP 102/64 (BP Location: Left Arm, Patient Position: Sitting, Cuff Size: Normal)   Pulse 74   Temp 98.4 F (36.9 C) (Oral)   Ht 5' 7 (1.702 m)   Wt 96 lb (43.5 kg)   LMP  (LMP Unknown) Comment: gyn  SpO2 97%   BMI 15.04 kg/m   Objective:     Physical Exam VITALS: BP-  102/62 GENERAL: Alert, cooperative, well developed, no acute distress. HEAD: Normocephalic atraumatic. EYES: Extraocular movements intact BL, pupils round, equal and reactive to light BL, conjunctivae normal BL. EXTREMITIES: No cyanosis or edema. NEUROLOGICAL: Mentation at baseline      Assessment & Plan:   New patient, past medical and social history thoroughly  reviewed and updated in chart.  Assessment & Plan Parkinson'Howard disease with Lewy body dementia, dyskinesia and fluctuating manifestations Requires significant assistance with ADLs and IADLs, by her husband who is primary caregiver, supplemented by caregivers to come to the house for a few hours during the day.  Following with neurology at [facility], on regimen below. Dyskinesia and gait instability has led to multiple prior fractures, most recently  Reduced Rytary  dosage improved dyskinesia. Continues physical activity to prevent falls. Recent fall with clear CT scan. Uses recumbent bike. Receives Xeomin injections. Uses Sinemet  for nighttime coverage. Rescue Rytary  available for nighttime dyskinesia. - Continue Rytary  at reduced dosage. - Continue Xeomin injections every three months. - Continue Sinemet  at night for extended release coverage. - Use walker for mobility support. - Engage in regular physical activity, including recumbent bike use. - Use rescue Rytary  as needed for nighttime dyskinesia.  Other specified hypothyroidism Synthroid  dosing schedule may affect efficacy due to inadequate separation from other medications. Last thyroid  lab normal a year ago. - Adjust Synthroid  dosing to be taken 30 minutes before other medications on an empty stomach. - Ordered thyroid  lab in 8 weeks to assess efficacy of new dosing schedule.  Other osteoporosis without current pathological fracture Osteoporosis managed with Prolia. Previous fractures noted. Compression binder improves orthostatic hypotension. - Continue Prolia injections every six months. - Started calcium  and vitamin D  combination supplement, two tablets daily. - Consider over-the-counter options like Caltrate or Citracal if insurance issues arise.  Vitamin B12 deficiency Previous supplementation interrupted. Last B12 level low a year ago. - Ordered vitamin B12 level to assess current status. - Will consider resuming B12  supplementation based on lab results.  Prediabetes Last A1c checked three years ago. Due for repeat testing. - Ordered A1c test to assess current status of prediabetes.   No follow-ups on file.   Kelly Staszak K Akshay Spang, MD  06/14/24    Contains text generated by [employer].    "

## 2024-06-14 NOTE — Patient Instructions (Addendum)
 Thank you for visiting [facility] today! Here's what we talked about: - START taking synthroid  first thing in the morning 30 mins before all other meds on empty stomach  - STOP vitamin D  supplement and start Combination supplement I sent to pharmacy. Get over the counter Caltrate or Citracal if insurance issues

## 2024-06-15 LAB — VITAMIN D 25 HYDROXY (VIT D DEFICIENCY, FRACTURES): Vit D, 25-Hydroxy: 38 ng/mL (ref 30–100)

## 2024-06-15 LAB — VITAMIN B12: Vitamin B-12: 290 pg/mL (ref 200–1100)

## 2024-06-15 LAB — HEMOGLOBIN A1C
Hgb A1c MFr Bld: 5.6 %
Mean Plasma Glucose: 114 mg/dL
eAG (mmol/L): 6.3 mmol/L

## 2024-06-23 ENCOUNTER — Ambulatory Visit: Payer: Self-pay

## 2024-08-09 ENCOUNTER — Other Ambulatory Visit

## 2024-11-06 ENCOUNTER — Ambulatory Visit

## 2024-11-12 ENCOUNTER — Encounter

## 2024-11-13 ENCOUNTER — Ambulatory Visit: Admitting: Adult Health
